# Patient Record
Sex: Female | Born: 1950 | Race: White | Hispanic: No | Smoking: Never smoker
Health system: Southern US, Community
[De-identification: ages and names within clinical notes are randomized; demographics above are authoritative.]

## PROBLEM LIST (undated history)

## (undated) DIAGNOSIS — I639 Cerebral infarction, unspecified: Secondary | ICD-10-CM

## (undated) DIAGNOSIS — I10 Essential (primary) hypertension: Secondary | ICD-10-CM

## (undated) DIAGNOSIS — F419 Anxiety disorder, unspecified: Secondary | ICD-10-CM

## (undated) DIAGNOSIS — E7800 Pure hypercholesterolemia, unspecified: Secondary | ICD-10-CM

## (undated) DIAGNOSIS — E669 Obesity, unspecified: Secondary | ICD-10-CM

## (undated) DIAGNOSIS — I509 Heart failure, unspecified: Secondary | ICD-10-CM

## (undated) DIAGNOSIS — R569 Unspecified convulsions: Secondary | ICD-10-CM

## (undated) HISTORY — DX: Cerebral infarction, unspecified: I63.9

## (undated) HISTORY — PX: ABDOMINAL HYSTERECTOMY: SHX81

## (undated) HISTORY — PX: CHOLECYSTECTOMY: SHX55

## (undated) HISTORY — PX: ABDOMINAL SURGERY: SHX537

## (undated) HISTORY — PX: JOINT REPLACEMENT: SHX530

---

## 2009-06-25 ENCOUNTER — Emergency Department (HOSPITAL_BASED_OUTPATIENT_CLINIC_OR_DEPARTMENT_OTHER): Admission: EM | Admit: 2009-06-25 | Discharge: 2009-06-25 | Payer: Self-pay | Admitting: Emergency Medicine

## 2009-07-01 ENCOUNTER — Emergency Department (HOSPITAL_BASED_OUTPATIENT_CLINIC_OR_DEPARTMENT_OTHER): Admission: EM | Admit: 2009-07-01 | Discharge: 2009-07-02 | Payer: Self-pay | Admitting: Emergency Medicine

## 2009-07-01 ENCOUNTER — Ambulatory Visit: Payer: Self-pay | Admitting: Diagnostic Radiology

## 2009-07-24 ENCOUNTER — Emergency Department (HOSPITAL_BASED_OUTPATIENT_CLINIC_OR_DEPARTMENT_OTHER): Admission: EM | Admit: 2009-07-24 | Discharge: 2009-07-24 | Payer: Self-pay | Admitting: Emergency Medicine

## 2009-10-09 ENCOUNTER — Emergency Department (HOSPITAL_BASED_OUTPATIENT_CLINIC_OR_DEPARTMENT_OTHER): Admission: EM | Admit: 2009-10-09 | Discharge: 2009-10-09 | Payer: Self-pay | Admitting: Emergency Medicine

## 2009-10-27 ENCOUNTER — Ambulatory Visit: Payer: Self-pay | Admitting: Radiology

## 2009-10-27 ENCOUNTER — Emergency Department (HOSPITAL_BASED_OUTPATIENT_CLINIC_OR_DEPARTMENT_OTHER): Admission: EM | Admit: 2009-10-27 | Discharge: 2009-10-27 | Payer: Self-pay | Admitting: Emergency Medicine

## 2009-11-14 ENCOUNTER — Emergency Department (HOSPITAL_BASED_OUTPATIENT_CLINIC_OR_DEPARTMENT_OTHER): Admission: EM | Admit: 2009-11-14 | Discharge: 2009-11-14 | Payer: Self-pay | Admitting: Emergency Medicine

## 2009-11-25 ENCOUNTER — Emergency Department (HOSPITAL_BASED_OUTPATIENT_CLINIC_OR_DEPARTMENT_OTHER): Admission: EM | Admit: 2009-11-25 | Discharge: 2009-11-25 | Payer: Self-pay | Admitting: Emergency Medicine

## 2010-01-18 ENCOUNTER — Emergency Department (HOSPITAL_BASED_OUTPATIENT_CLINIC_OR_DEPARTMENT_OTHER): Admission: EM | Admit: 2010-01-18 | Discharge: 2010-01-18 | Payer: Self-pay | Admitting: Emergency Medicine

## 2010-01-22 ENCOUNTER — Ambulatory Visit: Payer: Self-pay | Admitting: Diagnostic Radiology

## 2010-01-22 ENCOUNTER — Emergency Department (HOSPITAL_BASED_OUTPATIENT_CLINIC_OR_DEPARTMENT_OTHER): Admission: EM | Admit: 2010-01-22 | Discharge: 2010-01-22 | Payer: Self-pay | Admitting: Emergency Medicine

## 2010-02-02 ENCOUNTER — Emergency Department (HOSPITAL_BASED_OUTPATIENT_CLINIC_OR_DEPARTMENT_OTHER): Admission: EM | Admit: 2010-02-02 | Discharge: 2010-02-03 | Payer: Self-pay | Admitting: Emergency Medicine

## 2010-02-10 ENCOUNTER — Emergency Department (HOSPITAL_BASED_OUTPATIENT_CLINIC_OR_DEPARTMENT_OTHER)
Admission: EM | Admit: 2010-02-10 | Discharge: 2010-02-10 | Payer: Self-pay | Source: Home / Self Care | Admitting: Emergency Medicine

## 2010-05-03 ENCOUNTER — Emergency Department (HOSPITAL_BASED_OUTPATIENT_CLINIC_OR_DEPARTMENT_OTHER)
Admission: EM | Admit: 2010-05-03 | Discharge: 2010-05-04 | Payer: Self-pay | Source: Home / Self Care | Admitting: Emergency Medicine

## 2010-06-25 ENCOUNTER — Emergency Department (HOSPITAL_BASED_OUTPATIENT_CLINIC_OR_DEPARTMENT_OTHER)
Admission: EM | Admit: 2010-06-25 | Discharge: 2010-06-25 | Disposition: A | Discharge status: Home / Self Care | Payer: BC Managed Care – PPO | Attending: Emergency Medicine | Admitting: Emergency Medicine

## 2010-06-25 DIAGNOSIS — Y92009 Unspecified place in unspecified non-institutional (private) residence as the place of occurrence of the external cause: Secondary | ICD-10-CM | POA: Insufficient documentation

## 2010-06-25 DIAGNOSIS — W260XXA Contact with knife, initial encounter: Secondary | ICD-10-CM | POA: Insufficient documentation

## 2010-06-25 DIAGNOSIS — W261XXA Contact with sword or dagger, initial encounter: Secondary | ICD-10-CM | POA: Insufficient documentation

## 2010-06-25 DIAGNOSIS — E119 Type 2 diabetes mellitus without complications: Secondary | ICD-10-CM | POA: Insufficient documentation

## 2010-06-25 DIAGNOSIS — E7800 Pure hypercholesterolemia, unspecified: Secondary | ICD-10-CM | POA: Insufficient documentation

## 2010-06-25 DIAGNOSIS — W01119A Fall on same level from slipping, tripping and stumbling with subsequent striking against unspecified sharp object, initial encounter: Secondary | ICD-10-CM | POA: Insufficient documentation

## 2010-06-25 DIAGNOSIS — S0100XA Unspecified open wound of scalp, initial encounter: Secondary | ICD-10-CM | POA: Insufficient documentation

## 2010-06-25 DIAGNOSIS — Z79899 Other long term (current) drug therapy: Secondary | ICD-10-CM | POA: Insufficient documentation

## 2010-06-25 DIAGNOSIS — I10 Essential (primary) hypertension: Secondary | ICD-10-CM | POA: Insufficient documentation

## 2010-07-25 LAB — CBC
Hemoglobin: 14.8 g/dL (ref 12.0–15.0)
MCHC: 34.7 g/dL (ref 30.0–36.0)
MCV: 80.5 fL (ref 78.0–100.0)
Platelets: 303 10*3/uL (ref 150–400)
RBC: 5.29 MIL/uL — ABNORMAL HIGH (ref 3.87–5.11)
RDW: 13.2 % (ref 11.5–15.5)

## 2010-07-25 LAB — URINALYSIS, ROUTINE W REFLEX MICROSCOPIC
Glucose, UA: >1000 mg/dL — AB
Hgb urine dipstick: NEGATIVE
Ketones, ur: 15 mg/dL — AB
Protein, ur: NEGATIVE mg/dL
Urobilinogen, UA: 0.2 mg/dL (ref 0.0–1.0)

## 2010-07-25 LAB — DIFFERENTIAL
Basophils Absolute: 0.1 10*3/uL (ref 0.0–0.1)
Basophils Relative: 1 % (ref 0–1)
Eosinophils Relative: 3 % (ref 0–5)
Lymphs Abs: 2.2 10*3/uL (ref 0.7–4.0)
Monocytes Absolute: 0.7 10*3/uL (ref 0.1–1.0)
Monocytes Relative: 7 % (ref 3–12)
Neutrophils Relative %: 66 % (ref 43–77)

## 2010-07-25 LAB — COMPREHENSIVE METABOLIC PANEL
ALT: 20 U/L (ref 0–35)
Albumin: 4.3 g/dL (ref 3.5–5.2)
Alkaline Phosphatase: 166 U/L — ABNORMAL HIGH (ref 39–117)
CO2: 25 mEq/L (ref 19–32)
Calcium: 9.8 mg/dL (ref 8.4–10.5)
Creatinine, Ser: 0.6 mg/dL (ref 0.4–1.2)
GFR calc Af Amer: >60 mL/min (ref >60)
Sodium: 143 mEq/L (ref 135–145)

## 2010-07-25 LAB — GLUCOSE, CAPILLARY: Glucose-Capillary: 323 mg/dL — ABNORMAL HIGH (ref 70–99)

## 2010-07-25 LAB — LIPASE, BLOOD: Lipase: 73 U/L (ref 23–300)

## 2010-08-04 LAB — CBC
Hemoglobin: 14.1 g/dL (ref 12.0–15.0)
RBC: 4.75 MIL/uL (ref 3.87–5.11)
WBC: 5.6 10*3/uL (ref 4.0–10.5)

## 2010-08-04 LAB — COMPREHENSIVE METABOLIC PANEL
Alkaline Phosphatase: 125 U/L — ABNORMAL HIGH (ref 39–117)
BUN: 11 mg/dL (ref 6–23)
CO2: 30 mEq/L (ref 19–32)
Chloride: 102 mEq/L (ref 96–112)
GFR calc Af Amer: >60 mL/min (ref >60)
Glucose, Bld: 115 mg/dL — ABNORMAL HIGH (ref 70–99)
Potassium: 3.8 mEq/L (ref 3.5–5.1)
Sodium: 142 mEq/L (ref 135–145)
Total Bilirubin: 0.3 mg/dL (ref 0.3–1.2)

## 2010-08-04 LAB — HERPES SIMPLEX VIRUS CULTURE: Culture: DETECTED

## 2010-08-04 LAB — POCT CARDIAC MARKERS
CKMB, poc: <1 ng/mL — ABNORMAL LOW (ref 1.0–8.0)
Troponin i, poc: <0.05 ng/mL (ref 0.00–0.09)

## 2010-08-04 LAB — DIFFERENTIAL
Basophils Absolute: 0.1 10*3/uL (ref 0.0–0.1)
Lymphocytes Relative: 14 % (ref 12–46)
Lymphs Abs: 0.8 10*3/uL (ref 0.7–4.0)
Monocytes Relative: 1 % — ABNORMAL LOW (ref 3–12)
Neutro Abs: 4.6 10*3/uL (ref 1.7–7.7)

## 2010-08-04 LAB — URINALYSIS, ROUTINE W REFLEX MICROSCOPIC
Bilirubin Urine: NEGATIVE
Ketones, ur: NEGATIVE mg/dL
Nitrite: NEGATIVE
Specific Gravity, Urine: 1.024 (ref 1.005–1.030)
pH: 7 (ref 5.0–8.0)

## 2010-08-04 LAB — URINE CULTURE
Colony Count: NO GROWTH
Culture: NO GROWTH

## 2010-08-08 LAB — GLUCOSE, CAPILLARY: Glucose-Capillary: 189 mg/dL — ABNORMAL HIGH (ref 70–99)

## 2010-08-08 LAB — URINALYSIS, ROUTINE W REFLEX MICROSCOPIC
Bilirubin Urine: NEGATIVE
Glucose, UA: NEGATIVE mg/dL
Hgb urine dipstick: NEGATIVE
Nitrite: NEGATIVE

## 2010-08-08 LAB — URINE CULTURE: Culture: NO GROWTH

## 2010-08-08 LAB — GC/CHLAMYDIA PROBE AMP, GENITAL
Chlamydia, DNA Probe: NEGATIVE
GC Probe Amp, Genital: NEGATIVE

## 2010-08-08 LAB — WET PREP, GENITAL: Yeast Wet Prep HPF POC: NONE SEEN

## 2011-02-15 ENCOUNTER — Encounter: Payer: Self-pay | Admitting: *Deleted

## 2011-02-15 ENCOUNTER — Emergency Department (HOSPITAL_BASED_OUTPATIENT_CLINIC_OR_DEPARTMENT_OTHER)
Admission: EM | Admit: 2011-02-15 | Discharge: 2011-02-15 | Disposition: A | Discharge status: Home / Self Care | Payer: BC Managed Care – PPO | Attending: Emergency Medicine | Admitting: Emergency Medicine

## 2011-02-15 DIAGNOSIS — S30860A Insect bite (nonvenomous) of lower back and pelvis, initial encounter: Secondary | ICD-10-CM | POA: Insufficient documentation

## 2011-02-15 DIAGNOSIS — L02219 Cutaneous abscess of trunk, unspecified: Secondary | ICD-10-CM | POA: Insufficient documentation

## 2011-02-15 DIAGNOSIS — L03311 Cellulitis of abdominal wall: Secondary | ICD-10-CM

## 2011-02-15 DIAGNOSIS — E7800 Pure hypercholesterolemia, unspecified: Secondary | ICD-10-CM | POA: Insufficient documentation

## 2011-02-15 DIAGNOSIS — W57XXXA Bitten or stung by nonvenomous insect and other nonvenomous arthropods, initial encounter: Secondary | ICD-10-CM | POA: Insufficient documentation

## 2011-02-15 DIAGNOSIS — Z79899 Other long term (current) drug therapy: Secondary | ICD-10-CM | POA: Insufficient documentation

## 2011-02-15 DIAGNOSIS — I10 Essential (primary) hypertension: Secondary | ICD-10-CM | POA: Insufficient documentation

## 2011-02-15 DIAGNOSIS — E669 Obesity, unspecified: Secondary | ICD-10-CM | POA: Insufficient documentation

## 2011-02-15 HISTORY — DX: Essential (primary) hypertension: I10

## 2011-02-15 HISTORY — DX: Pure hypercholesterolemia, unspecified: E78.00

## 2011-02-15 LAB — GLUCOSE, CAPILLARY: Glucose-Capillary: 335 mg/dL — ABNORMAL HIGH (ref 70–99)

## 2011-02-15 MED ORDER — SULFAMETHOXAZOLE-TRIMETHOPRIM 800-160 MG PO TABS: 1.0000 | ORAL_TABLET | Freq: Two times a day (BID) | ORAL | Status: DC

## 2011-02-15 NOTE — ED Notes (Signed)
Pt amb to room 5 with quick steady gait smiling in nad. Pt reports noticing some redness on her stomach and "aching" x last night. States she has gotten these in the past from her insulin injections, but feels that she has been bitten by an insect, "maybe it was a spider.Marland KitchenMarland Kitchen"

## 2011-02-15 NOTE — ED Provider Notes (Addendum)
History    pleasant female with known history of obesity hypertension and diabetes. She states she noticed a red mark on her anterior abdominal wall this morning and feels she was bitten by an insect. She has had minimal nausea no fevers no drainage.  Patient has been compliant with her daily medications. Denies any recent injury. No other systemic symptoms. Patient did note some dietary indiscretion yesterday for a family party. No history of penicillin allergy.  CSN: [id number] Arrival date & time: 02/15/2011  8:02 AM  Chief Complaint  Patient presents with  . Insect Bite    (Consider location/radiation/quality/duration/timing/severity/associated sxs/prior treatment) HPI  Past Medical History  Diagnosis Date  . Diabetes mellitus   . Hypertension   . Hypercholesteremia     Past Surgical History  Procedure Date  . Abdominal surgery     History reviewed. No pertinent family history.  History  Substance Use Topics  . Smoking status: Never Smoker   . Smokeless tobacco: Not on file  . Alcohol Use: No    OB History    Grav Para Term Preterm Abortions TAB SAB Ect Mult Living                  Review of Systems  Allergies  Penicillins  Home Medications   Current Outpatient Rx  Name Route Sig Dispense Refill  . ATORVASTATIN CALCIUM 40 MG PO TABS Oral Take 40 mg by mouth daily.      . DULOXETINE HCL 60 MG PO CPEP Oral Take 60 mg by mouth daily.      Marland Kitchen FEXOFENADINE HCL 180 MG PO TABS Oral Take 180 mg by mouth daily.      Marland Kitchen GLIPIZIDE 10 MG PO TABS Oral Take 10 mg by mouth 2 (two) times daily before a meal.      . LISINOPRIL-HYDROCHLOROTHIAZIDE 10-12.5 MG PO TABS Oral Take 1 tablet by mouth daily.      Marland Kitchen LORAZEPAM 2 MG PO TABS Oral Take 2 mg by mouth 2 (two) times daily.      Marland Kitchen METHOCARBAMOL 750 MG PO TABS Oral Take 750 mg by mouth 4 (four) times daily.      Marland Kitchen PHENYTOIN SODIUM EXTENDED 100 MG PO CAPS Oral Take 400 mg by mouth 2 (two) times daily.      Marland Kitchen  SITAGLIPTIN-METFORMIN HCL 50-1000 MG PO TABS Oral Take 1 tablet by mouth 2 (two) times daily with a meal.      . ZOLPIDEM TARTRATE 10 MG PO TABS Oral Take 10 mg by mouth at bedtime as needed.        BP 134/67  Pulse 89  Temp(Src) 98.6 F (37 C) (Oral)  Resp 18  SpO2 100%  Physical Exam  Constitutional: She is oriented to person, place, and time. She appears well-developed and well-nourished.       obese  HENT:  Head: Normocephalic and atraumatic.  Eyes: Conjunctivae and EOM are normal. Pupils are equal, round, and reactive to light.  Neck: Neck supple.  Cardiovascular: Normal rate and regular rhythm.  Exam reveals no gallop and no friction rub.   No murmur heard. Pulmonary/Chest: Breath sounds normal. She has no wheezes. She has no rales. She exhibits no tenderness.  Abdominal: Soft. Bowel sounds are normal. She exhibits no distension. There is no tenderness. There is no rebound and no guarding.  Musculoskeletal: Normal range of motion.  Neurological: She is alert and oriented to person, place, and time. No cranial nerve deficit. Coordination normal.  Skin: Skin is warm and dry. No rash noted.       A small oval area to be skin midabdomen with mild redness central umbilication, likely consistent with an insect bite.. Mild early surrounding redness, possibly early cellulitis. Nonfluctuant no drainage no bleeding no surrounding ecchymosis or necrosis.  Psychiatric: She has a normal mood and affect.    ED Course  Procedures (including critical care time)  Labs Reviewed - No data to display No results found.   No diagnosis found.    MDM  Pt is seen and examined;  Initial history and physical completed.  Will follow.          Demontay Grantham A. Jarvin Ogren, MD 02/15/11 1610  Patient's blood sugar was noted to be mildly elevated, just over 300. However she had not taken any of her morning diabetes medications. As such we will give her her normal diabetes medications and we'll  encourage close followup with her blood sugars and diabetic diet over the next several days.  Allia Wiltsey A. Patrica Duel, MD 02/15/11 [phone]

## 2011-02-16 ENCOUNTER — Emergency Department (HOSPITAL_BASED_OUTPATIENT_CLINIC_OR_DEPARTMENT_OTHER)
Admission: EM | Admit: 2011-02-16 | Discharge: 2011-02-16 | Disposition: A | Discharge status: Home / Self Care | Payer: BC Managed Care – PPO | Attending: Emergency Medicine | Admitting: Emergency Medicine

## 2011-02-16 ENCOUNTER — Encounter (HOSPITAL_BASED_OUTPATIENT_CLINIC_OR_DEPARTMENT_OTHER): Payer: Self-pay | Admitting: Emergency Medicine

## 2011-02-16 DIAGNOSIS — L02219 Cutaneous abscess of trunk, unspecified: Secondary | ICD-10-CM | POA: Insufficient documentation

## 2011-02-16 DIAGNOSIS — I10 Essential (primary) hypertension: Secondary | ICD-10-CM | POA: Insufficient documentation

## 2011-02-16 DIAGNOSIS — Z79899 Other long term (current) drug therapy: Secondary | ICD-10-CM | POA: Insufficient documentation

## 2011-02-16 DIAGNOSIS — E7800 Pure hypercholesterolemia, unspecified: Secondary | ICD-10-CM | POA: Insufficient documentation

## 2011-02-16 DIAGNOSIS — L03319 Cellulitis of trunk, unspecified: Secondary | ICD-10-CM | POA: Insufficient documentation

## 2011-02-16 DIAGNOSIS — E119 Type 2 diabetes mellitus without complications: Secondary | ICD-10-CM | POA: Insufficient documentation

## 2011-02-16 DIAGNOSIS — L0390 Cellulitis, unspecified: Secondary | ICD-10-CM

## 2011-02-16 DIAGNOSIS — Z794 Long term (current) use of insulin: Secondary | ICD-10-CM | POA: Insufficient documentation

## 2011-02-16 MED ORDER — CLINDAMYCIN HCL 150 MG PO CAPS: 300.0000 mg | ORAL_CAPSULE | Freq: Three times a day (TID) | ORAL | Status: AC

## 2011-02-16 MED ORDER — CLINDAMYCIN PHOSPHATE 900 MG/50ML IV SOLN
900.0000 mg | Freq: Once | INTRAVENOUS | Status: AC
  Administered 2011-02-16: 900 mg via INTRAVENOUS
  Filled 2011-02-16: qty 50

## 2011-02-16 MED ORDER — SODIUM CHLORIDE 0.9 % IV SOLN
Freq: Once | INTRAVENOUS | Status: AC
  Administered 2011-02-16: 08:00:00 via INTRAVENOUS

## 2011-02-16 NOTE — ED Notes (Signed)
Pt has infected area to abd (insect bite/abscess), pt was seen here yesterday for same and pt reports worsening pain and redness

## 2011-02-16 NOTE — ED Notes (Signed)
IV antibiotics infusing without difficulty pt given coffee per her request

## 2011-02-16 NOTE — ED Provider Notes (Signed)
History    patient presents with increasing abdominal pain. Her pain is focally about an area near her umbilicus. She notes that she had the sharp onset of pain 2 days ago; felt as though she was bitten by an insect. She was seen in this emergency department for this complaint yesterday. She was diagnosedwdiagnosed with likely insect bite with early cellulitis.  She notes that since yesterday her pain and the erythema about the sting has increased. She complains of overnight fever with multiple episodes of emesis, no diarrhea. No relief with anything. Symptoms seem to be worsening but himself. She has been compliant with her Septra as well as all her regular medications.  CSN: [id number] Arrival date & time: 02/16/2011  7:16 AM  Chief Complaint  Patient presents with  . Wound Infection    (Consider location/radiation/quality/duration/timing/severity/associated sxs/prior treatment) HPI  Past Medical History  Diagnosis Date  . Diabetes mellitus   . Hypertension   . Hypercholesteremia     Past Surgical History  Procedure Date  . Abdominal surgery     History reviewed. No pertinent family history.  History  Substance Use Topics  . Smoking status: Never Smoker   . Smokeless tobacco: Not on file  . Alcohol Use: No    OB History    Grav Para Term Preterm Abortions TAB SAB Ect Mult Living                  Review of Systems  Constitutional: Positive for fever and chills.  HENT: Negative.   Eyes: Negative.   Respiratory: Negative.   Cardiovascular: Negative.   Genitourinary: Negative.   Musculoskeletal: Positive for myalgias.  Neurological: Positive for light-headedness.    Allergies  Penicillins  Home Medications   Current Outpatient Rx  Name Route Sig Dispense Refill  . ATORVASTATIN CALCIUM 40 MG PO TABS Oral Take 40 mg by mouth daily.      . DULOXETINE HCL 60 MG PO CPEP Oral Take 60 mg by mouth daily.      Marland Kitchen FEXOFENADINE HCL 180 MG PO TABS Oral Take 180 mg by  mouth daily.      Marland Kitchen GLIPIZIDE 10 MG PO TABS Oral Take 10 mg by mouth 2 (two) times daily before a meal.      . LISINOPRIL-HYDROCHLOROTHIAZIDE 10-12.5 MG PO TABS Oral Take 1 tablet by mouth daily.      Marland Kitchen LORAZEPAM 2 MG PO TABS Oral Take 2 mg by mouth 2 (two) times daily.      Marland Kitchen METHOCARBAMOL 750 MG PO TABS Oral Take 750 mg by mouth 4 (four) times daily.      Marland Kitchen PHENYTOIN SODIUM EXTENDED 100 MG PO CAPS Oral Take 400 mg by mouth 2 (two) times daily.      Marland Kitchen SITAGLIPTIN-METFORMIN HCL 50-1000 MG PO TABS Oral Take 1 tablet by mouth 2 (two) times daily with a meal.      . SULFAMETHOXAZOLE-TRIMETHOPRIM 800-160 MG PO TABS Oral Take 1 tablet by mouth every 12 (twelve) hours. 10 tablet 0  . ZOLPIDEM TARTRATE 10 MG PO TABS Oral Take 10 mg by mouth at bedtime as needed.        BP 148/98  Pulse 103  Temp(Src) 98 F (36.7 C) (Oral)  Resp 18  SpO2 95%  Physical Exam  Constitutional: She is oriented to person, place, and time. She appears well-developed and well-nourished.  HENT:  Head: Normocephalic and atraumatic.  Eyes: EOM are normal.  Cardiovascular: Normal rate and regular rhythm.  Pulmonary/Chest: Effort normal and breath sounds normal.  Abdominal: She exhibits no distension. There is no tenderness. There is no guarding.  Musculoskeletal: She exhibits no edema and no tenderness.  Neurological: She is alert and oriented to person, place, and time.  Skin: Skin is warm and dry.       On the anterior abdomen there is a roughly 3 cm patch of particularly dark skin with surrounding erythema extending approximately 6 cm in all directions. Area is nonfluctuant, mildly indurated in the center, with no appreciable drainable area.    ED Course  Procedures (including critical care time)  Labs Reviewed - No data to display No results found.   No diagnosis found.    MDM  This insulin-dependent diabetic now presents with concerns over wound infection. The wound itself is erythematous, slightly  indurated, without any drainable collection. Although the patient is early in her antibiotic course, her description of increasing fever, nausea, emesis is somewhat concerning, though here she is afebrile, and generally well appearing. She has received a single IV dose of clindamycin, and notes that she is feeling more comfortable. She was discharged with clindamycin, with cessation of the Septra. Explicit return precautions were provided.        Gerhard Munch, MD 02/16/11 [phone]

## 2011-02-16 NOTE — ED Notes (Signed)
Pt states was seen  Here yesterday diagnosed with cellulitis and placed on bactrim she has taken two doses so far is concerned because she is having pain and the "heat" is worse

## 2013-12-26 DIAGNOSIS — M1990 Unspecified osteoarthritis, unspecified site: Secondary | ICD-10-CM | POA: Insufficient documentation

## 2013-12-26 DIAGNOSIS — G40909 Epilepsy, unspecified, not intractable, without status epilepticus: Secondary | ICD-10-CM

## 2013-12-26 DIAGNOSIS — F411 Generalized anxiety disorder: Secondary | ICD-10-CM | POA: Diagnosis present

## 2013-12-26 DIAGNOSIS — K589 Irritable bowel syndrome without diarrhea: Secondary | ICD-10-CM | POA: Insufficient documentation

## 2013-12-26 DIAGNOSIS — F321 Major depressive disorder, single episode, moderate: Secondary | ICD-10-CM | POA: Insufficient documentation

## 2014-04-10 ENCOUNTER — Emergency Department (HOSPITAL_COMMUNITY): Payer: BC Managed Care – PPO

## 2014-04-10 ENCOUNTER — Emergency Department (HOSPITAL_COMMUNITY)
Admission: EM | Admit: 2014-04-10 | Discharge: 2014-04-11 | Disposition: A | Discharge status: Home / Self Care | Payer: BC Managed Care – PPO | Attending: Emergency Medicine | Admitting: Emergency Medicine

## 2014-04-10 ENCOUNTER — Encounter (HOSPITAL_COMMUNITY): Payer: Self-pay | Admitting: *Deleted

## 2014-04-10 DIAGNOSIS — E119 Type 2 diabetes mellitus without complications: Secondary | ICD-10-CM | POA: Insufficient documentation

## 2014-04-10 DIAGNOSIS — Z880 Allergy status to penicillin: Secondary | ICD-10-CM | POA: Diagnosis not present

## 2014-04-10 DIAGNOSIS — M542 Cervicalgia: Secondary | ICD-10-CM | POA: Insufficient documentation

## 2014-04-10 DIAGNOSIS — I10 Essential (primary) hypertension: Secondary | ICD-10-CM | POA: Insufficient documentation

## 2014-04-10 DIAGNOSIS — R51 Headache: Secondary | ICD-10-CM | POA: Insufficient documentation

## 2014-04-10 DIAGNOSIS — E780 Pure hypercholesterolemia: Secondary | ICD-10-CM | POA: Diagnosis not present

## 2014-04-10 DIAGNOSIS — G510 Bell's palsy: Secondary | ICD-10-CM | POA: Diagnosis not present

## 2014-04-10 DIAGNOSIS — Z79899 Other long term (current) drug therapy: Secondary | ICD-10-CM | POA: Insufficient documentation

## 2014-04-10 DIAGNOSIS — H539 Unspecified visual disturbance: Secondary | ICD-10-CM | POA: Diagnosis not present

## 2014-04-10 DIAGNOSIS — Z760 Encounter for issue of repeat prescription: Secondary | ICD-10-CM | POA: Insufficient documentation

## 2014-04-10 DIAGNOSIS — R519 Headache, unspecified: Secondary | ICD-10-CM

## 2014-04-10 LAB — CBC WITH DIFFERENTIAL/PLATELET
Basophils Absolute: 0 10*3/uL (ref 0.0–0.1)
Basophils Relative: 0 % (ref 0–1)
Eosinophils Absolute: 0 10*3/uL (ref 0.0–0.7)
Eosinophils Relative: 0 % (ref 0–5)
HCT: 39.2 % (ref 36.0–46.0)
HEMOGLOBIN: 13.2 g/dL (ref 12.0–15.0)
LYMPHS ABS: 1.2 10*3/uL (ref 0.7–4.0)
LYMPHS PCT: 14 % (ref 12–46)
MCH: 28.2 pg (ref 26.0–34.0)
MCHC: 33.7 g/dL (ref 30.0–36.0)
MCV: 83.8 fL (ref 78.0–100.0)
MONOS PCT: 4 % (ref 3–12)
Monocytes Absolute: 0.4 10*3/uL (ref 0.1–1.0)
NEUTROS ABS: 7.2 10*3/uL (ref 1.7–7.7)
NEUTROS PCT: 82 % — AB (ref 43–77)
PLATELETS: 345 10*3/uL (ref 150–400)
RBC: 4.68 MIL/uL (ref 3.87–5.11)
RDW: 13.5 % (ref 11.5–15.5)
WBC: 8.7 10*3/uL (ref 4.0–10.5)

## 2014-04-10 LAB — BASIC METABOLIC PANEL
Anion gap: 16 — ABNORMAL HIGH (ref 5–15)
BUN: 21 mg/dL (ref 6–23)
CHLORIDE: 101 meq/L (ref 96–112)
CO2: 20 meq/L (ref 19–32)
Calcium: 9.3 mg/dL (ref 8.4–10.5)
Creatinine, Ser: 0.9 mg/dL (ref 0.50–1.10)
GFR calc Af Amer: 78 mL/min — ABNORMAL LOW (ref >90)
GFR, EST NON AFRICAN AMERICAN: 68 mL/min — AB (ref >90)
GLUCOSE: 337 mg/dL — AB (ref 70–99)
POTASSIUM: 4.4 meq/L (ref 3.7–5.3)
SODIUM: 137 meq/L (ref 137–147)

## 2014-04-10 MED ORDER — IOHEXOL 350 MG/ML SOLN
80.0000 mL | Freq: Once | INTRAVENOUS | Status: AC | PRN
  Administered 2014-04-10: 80 mL via INTRAVENOUS

## 2014-04-10 MED ORDER — HYDROMORPHONE HCL 1 MG/ML IJ SOLN
1.0000 mg | Freq: Once | INTRAMUSCULAR | Status: AC
Start: 2014-04-10 — End: 2014-04-10
  Administered 2014-04-10: 1 mg via INTRAVENOUS
  Filled 2014-04-10: qty 1

## 2014-04-10 MED ORDER — ONDANSETRON HCL 4 MG/2ML IJ SOLN
4.0000 mg | Freq: Once | INTRAMUSCULAR | Status: AC
  Administered 2014-04-10: 4 mg via INTRAVENOUS
  Filled 2014-04-10: qty 2

## 2014-04-10 NOTE — ED Notes (Signed)
Pt in stating that she was seen yesterday at [facility] and dx with bells palsy, pt states she was written some prescriptions for antibiotics that she cannot afford. Here tonight to get a new antibiotic written

## 2014-04-10 NOTE — ED Provider Notes (Signed)
CSN: [id number]     Arrival date & time 04/10/14  1952 History   First MD Initiated Contact with Patient 04/10/14 2025     Chief Complaint  Patient presents with  . Medication Refill     (Consider location/radiation/quality/duration/timing/severity/associated sxs/prior Treatment) HPI Comments: Patient with history of diabetes, hypertension, high cholesterol, chronic back pain presents with complaint of Bell's palsy and need for medication prescription.  Patient states that she began having severe intermittent right-sided neck pain approximately one week ago that began while she was vacuuming. Episodes were intermittent lasting several seconds at a time. Patient is having a throbbing neckache in between episodes of severe pain. Patient has a history of cataract surgery but is noted progressively worsening blurry vision in her right eye only. She denies any sort of visual loss. Two days ago patient developed dysgeusia, right-sided facial droop, trouble using a straw. She was continuing to have pain. Patient was seen at [facility] where she had a head CT done. She was diagnosed with Bell's palsy and discharged to home on prednisone and Valtrex. Due to the patient's inability to afford Valtrex, she presents to the emergency department tonight for consideration of a different medication. Patient also has a history of seizures and takes Dilantin.  The history is provided by the patient.    Past Medical History  Diagnosis Date  . Diabetes mellitus   . Hypertension   . Hypercholesteremia    Past Surgical History  Procedure Laterality Date  . Abdominal surgery     History reviewed. No pertinent family history. History  Substance Use Topics  . Smoking status: Never Smoker   . Smokeless tobacco: Not on file  . Alcohol Use: No   OB History    No data available     Review of Systems  Constitutional: Negative for fever.  HENT: Negative for congestion, dental problem,  rhinorrhea and sinus pressure.   Eyes: Positive for visual disturbance. Negative for photophobia, discharge and redness.  Respiratory: Negative for shortness of breath.   Cardiovascular: Negative for chest pain.  Gastrointestinal: Negative for nausea and vomiting.  Musculoskeletal: Positive for neck pain. Negative for gait problem and neck stiffness.  Skin: Negative for rash.  Neurological: Positive for facial asymmetry, weakness (facial) and headaches. Negative for syncope, speech difficulty, light-headedness and numbness.  Psychiatric/Behavioral: Negative for confusion.      Allergies  Penicillins  Home Medications   Prior to Admission medications   Medication Sig Start Date End Date Taking? Authorizing Provider  atorvastatin (LIPITOR) 40 MG tablet Take 40 mg by mouth daily.      Historical Provider, MD  DULoxetine (CYMBALTA) 60 MG capsule Take 60 mg by mouth daily.      Historical Provider, MD  fexofenadine (ALLEGRA) 180 MG tablet Take 180 mg by mouth daily.      Historical Provider, MD  glipiZIDE (GLUCOTROL) 10 MG tablet Take 10 mg by mouth 2 (two) times daily before a meal.      Historical Provider, MD  lisinopril-hydrochlorothiazide (PRINZIDE,ZESTORETIC) 10-12.5 MG per tablet Take 1 tablet by mouth daily.      Historical Provider, MD  LORazepam (ATIVAN) 2 MG tablet Take 2 mg by mouth 2 (two) times daily.      Historical Provider, MD  methocarbamol (ROBAXIN) 750 MG tablet Take 750 mg by mouth 4 (four) times daily.      Historical Provider, MD  phenytoin (DILANTIN) 100 MG ER capsule Take 400 mg by mouth 2 (two) times  daily.      Historical Provider, MD  sitaGLIPtan-metformin (JANUMET) 50-1000 MG per tablet Take 1 tablet by mouth 2 (two) times daily with a meal.      Historical Provider, MD  zolpidem (AMBIEN) 10 MG tablet Take 10 mg by mouth at bedtime as needed.      Historical Provider, MD   BP 154/75 mmHg  Pulse 92  Temp(Src) 98 F (36.7 C) (Oral)  Resp 24  SpO2  98% Physical Exam  Constitutional: She is oriented to person, place, and time. She appears well-developed and well-nourished.  HENT:  Head: Normocephalic and atraumatic.  Right Ear: Tympanic membrane, external ear and ear canal normal.  Left Ear: Tympanic membrane, external ear and ear canal normal.  Nose: Nose normal.  Mouth/Throat: Uvula is midline, oropharynx is clear and moist and mucous membranes are normal.  Eyes: Conjunctivae, EOM and lids are normal. Pupils are equal, round, and reactive to light. Right eye exhibits no nystagmus. Left eye exhibits no nystagmus.  Neck: Normal range of motion. Neck supple. No spinous process tenderness and no muscular tenderness present. Carotid bruit is not present. No thyroid mass and no thyromegaly present.  Cardiovascular: Normal rate and regular rhythm.   Pulmonary/Chest: Effort normal and breath sounds normal.  Abdominal: Soft. There is no tenderness.  Musculoskeletal:       Cervical back: She exhibits normal range of motion, no tenderness and no bony tenderness.  Neurological: She is alert and oriented to person, place, and time. She has normal strength and normal reflexes. A cranial nerve deficit (Patient with right facial nerve palsy) and sensory deficit is present. She displays a negative Romberg sign. Coordination and gait (baseline) normal. GCS eye subscore is 4. GCS verbal subscore is 5. GCS motor subscore is 6.  Patient ambulatory at baseline with cane. Patient has right-sided facial droop and loss of nasolabial fold. Patient's forehead is not spared.  Skin: Skin is warm and dry.  Psychiatric: She has a normal mood and affect.  Nursing note and vitals reviewed.   ED Course  Procedures (including critical care time) Labs Review Labs Reviewed  CBC WITH DIFFERENTIAL - Abnormal; Notable for the following:    Neutrophils Relative % 82 (*)    All other components within normal limits  BASIC METABOLIC PANEL - Abnormal; Notable for the  following:    Glucose, Bld 337 (*)    GFR calc non Af Amer 68 (*)    GFR calc Af Amer 78 (*)    Anion gap 16 (*)    All other components within normal limits    Imaging Review Ct Angio Neck W/cm &/or Wo/cm  04/10/2014   CLINICAL DATA:  Severe occipital and RIGHT posterior neck pain for 1 week; Bell's palsy like symptoms for 2 days, assess for vascular dissection. History of diabetes and hypertension.  EXAM: CT ANGIOGRAPHY NECK  TECHNIQUE: Multidetector CT imaging of the neck was performed using the standard protocol during bolus administration of intravenous contrast. Multiplanar CT image reconstructions and MIPs were obtained to evaluate the vascular anatomy. Carotid stenosis measurements (when applicable) are obtained utilizing NASCET criteria, using the distal internal carotid diameter as the denominator.  CONTRAST:  80mL OMNIPAQUE IOHEXOL 350 MG/ML SOLN  COMPARISON:  None available for comparison at time of study interpretation.  FINDINGS: Large body habitus results in overall noisy image quality.  RIGHT-sided aortic arch, the LEFT subclavian artery courses posteriorly to the trachea and esophagus. The LEFT Common carotid artery courses anteriorly to  the trachea and esophagus. Mild calcific atherosclerosis of the descending aorta. The origins of the innominate, left Common carotid artery and subclavian artery are widely patent.  Bilateral Common carotid arteries are widely patent, coursing in a straight line fashion. Assessment is somewhat limited by large body habitus and, somewhat poor contrast bolus timing. Normal appearance of the carotid bifurcations without hemodynamically significant stenosis by NASCET criteria. Under 2 mm eccentric calcific atherosclerosis of the origin LEFT internal carotid artery. Normal appearance of the included internal carotid arteries.  RIGHT vertebral artery is dominant. Normal appearance of the vertebral arteries, which appear widely patent. 1-2 mm eccentric calcific  atherosclerosis of the LEFT V4 segment without hemodynamically significant stenosis.  No hemodynamically significant stenosis by NASCET criteria. No dissection, no pseudoaneurysm. No abnormal luminal irregularity. No contrast extravasation.  Fatty replaced parotid glands. Prominent though not pathologically enlarged cervical lymph nodes are likely reactive. No acute osseous process though bone windows have not been submitted. At least moderate cervical spondylosis. Apparent severe LEFT C3-4 through C6-7 neural foraminal narrowing.  Review of the MIP images confirms the above findings.  IMPRESSION: Habitus limited examination.  No convincing evidence of acute vascular injury though, assessment limited by habitus and poor bolus timing. No definite hemodynamically significant stenosis.  RIGHT-sided aortic arch with aberrant LEFT subclavian artery.  Cervical spondylosis with apparent severe LEFT C3-4 thru C6-7 neural foraminal narrowing on these soft tissue windows.   Electronically Signed   By: Awilda Metro   On: 04/10/2014 23:25     EKG Interpretation None       9:52 PM Patient seen and examined. Work-up initiated. Medications ordered. Patient has exam consistent with a facial nerve palsy, however it would be unusual for the patient to have severe pain in this setting. I think that we need to rule out a vertebral artery or carotid artery dissection given the patient's pain and neurological symptoms. Patient agrees. We will give pain medication, check labs, performed CT angiogram with and without contrast of the patient's neck. We will move from fast track to acute ED.  Vital signs reviewed and are as follows: BP 154/75 mmHg  Pulse 92  Temp(Src) 98 F (36.7 C) (Oral)  Resp 24  SpO2 98%  1:40 AM Prior to discharge, imaging results discussed with Dr. Romeo Apple. Patient seen by Dr. Romeo Apple.   Patient and husband informed of results. Will give prescription for acyclovir. Patient was given first  dose of acyclovir in emergency department. Of note, patient is no longer on metformin so she will not need all this given IV dye load. Patient to follow-up with her primary care physician in the coming week for a re-check.  MDM   Final diagnoses:  Headache  Bell's palsy  Neck pain   Patient with severe neck pain followed several days later with development of symptoms consistent with Bell's palsy. Patient had a reportedly negative head CT last night performed at [facility]. Given neck pain and neurological symptoms, CT angiography of the neck was performed to evaluate for carotid or vertebral dissection as the cause of her neuro deficits and pain. This was negative. Pain is likely musculoskeletal in nature. Patient treated with pain medication and muscle relaxer. She was given a prescription for acyclovir that she can take for the next 7 days. She should be able to afford his medication. Otherwise patient stable during the emergency department stay. No change in her Bell's palsy symptoms.    Renne Crigler, PA-C 04/11/14 [phone]  Purvis Sheffield, MD 04/11/14 [phone]

## 2014-04-11 MED ORDER — METHOCARBAMOL 500 MG PO TABS
500.0000 mg | ORAL_TABLET | Freq: Two times a day (BID) | ORAL | Status: DC
Start: 2014-04-11 — End: 2015-01-03

## 2014-04-11 MED ORDER — ACYCLOVIR 400 MG PO TABS
400.0000 mg | ORAL_TABLET | Freq: Once | ORAL | Status: AC
  Administered 2014-04-11: 400 mg via ORAL
  Filled 2014-04-11: qty 1

## 2014-04-11 MED ORDER — OXYCODONE-ACETAMINOPHEN 5-325 MG PO TABS: 1.0000 | ORAL_TABLET | Freq: Four times a day (QID) | ORAL | Status: DC | PRN

## 2014-04-11 MED ORDER — ACYCLOVIR 400 MG PO TABS: 400.0000 mg | ORAL_TABLET | Freq: Every day | ORAL | Status: DC

## 2014-04-11 NOTE — ED Notes (Signed)
Dr Harrison at bedside

## 2014-04-11 NOTE — Discharge Instructions (Signed)
Please read and follow all provided instructions.  Your diagnoses today include:  1. Bell's palsy   2. Headache   3. Neck pain     Tests performed today include:  CT of your neck which did not show any problems with the blood vessels in your neck.   Vital signs. See below for your results today.   Medications:   Percocet (oxycodone/acetaminophen) - narcotic pain medication  DO NOT drive or perform any activities that require you to be awake and alert because this medicine can make you drowsy. BE VERY CAREFUL not to take multiple medicines containing Tylenol (also called acetaminophen). Doing so can lead to an overdose which can damage your liver and cause liver failure and possibly death.  Take any prescribed medications only as directed.  Additional information:  Follow any educational materials contained in this packet.  BE VERY CAREFUL not to take multiple medicines containing Tylenol (also called acetaminophen). Doing so can lead to an overdose which can damage your liver and cause liver failure and possibly death.   Follow-up instructions: Please follow-up with your primary care provider in the next 3 days for further evaluation of your symptoms.   Return instructions:   Please return to the Emergency Department if you experience worsening symptoms.  Return if the medications do not resolve your headache, if it recurs, or if you have multiple episodes of vomiting or cannot keep down fluids.  Return if you have a change from the usual headache.  RETURN IMMEDIATELY IF you:  Develop a sudden, severe headache  Develop confusion or become poorly responsive or faint  Develop a fever above 100.58F or problem breathing  Have a change in speech, vision, swallowing, or understanding  Develop new weakness, numbness, tingling, incoordination in your arms or legs  Have a seizure  Please return if you have any other emergent concerns.  Additional Information:  Your vital  signs today were: BP 113/55 mmHg   Pulse 80   Temp(Src) 98 F (36.7 C) (Oral)   Resp 24   SpO2 98% If your blood pressure (BP) was elevated above 135/85 this visit, please have this repeated by your doctor within one month. --------------

## 2014-11-21 ENCOUNTER — Encounter (HOSPITAL_COMMUNITY): Payer: Self-pay | Admitting: *Deleted

## 2014-11-21 ENCOUNTER — Emergency Department (HOSPITAL_COMMUNITY)
Admission: EM | Admit: 2014-11-21 | Discharge: 2014-11-21 | Disposition: A | Discharge status: Home / Self Care | Payer: BLUE CROSS/BLUE SHIELD | Attending: Emergency Medicine | Admitting: Emergency Medicine

## 2014-11-21 DIAGNOSIS — R350 Frequency of micturition: Secondary | ICD-10-CM | POA: Diagnosis not present

## 2014-11-21 DIAGNOSIS — B373 Candidiasis of vulva and vagina: Secondary | ICD-10-CM | POA: Insufficient documentation

## 2014-11-21 DIAGNOSIS — R300 Dysuria: Secondary | ICD-10-CM | POA: Diagnosis not present

## 2014-11-21 DIAGNOSIS — R319 Hematuria, unspecified: Secondary | ICD-10-CM | POA: Diagnosis present

## 2014-11-21 DIAGNOSIS — Z79899 Other long term (current) drug therapy: Secondary | ICD-10-CM | POA: Insufficient documentation

## 2014-11-21 DIAGNOSIS — R3915 Urgency of urination: Secondary | ICD-10-CM | POA: Diagnosis not present

## 2014-11-21 DIAGNOSIS — Z880 Allergy status to penicillin: Secondary | ICD-10-CM | POA: Insufficient documentation

## 2014-11-21 DIAGNOSIS — R34 Anuria and oliguria: Secondary | ICD-10-CM | POA: Diagnosis not present

## 2014-11-21 DIAGNOSIS — E780 Pure hypercholesterolemia: Secondary | ICD-10-CM | POA: Insufficient documentation

## 2014-11-21 DIAGNOSIS — I10 Essential (primary) hypertension: Secondary | ICD-10-CM | POA: Diagnosis not present

## 2014-11-21 DIAGNOSIS — B3731 Acute candidiasis of vulva and vagina: Secondary | ICD-10-CM

## 2014-11-21 DIAGNOSIS — E119 Type 2 diabetes mellitus without complications: Secondary | ICD-10-CM | POA: Diagnosis not present

## 2014-11-21 LAB — COMPREHENSIVE METABOLIC PANEL
ALBUMIN: 3.6 g/dL (ref 3.5–5.0)
ALT: 19 U/L (ref 14–54)
ANION GAP: 9 (ref 5–15)
AST: 19 U/L (ref 15–41)
Alkaline Phosphatase: 97 U/L (ref 38–126)
BILIRUBIN TOTAL: 0.2 mg/dL — AB (ref 0.3–1.2)
BUN: 15 mg/dL (ref 6–20)
CALCIUM: 9 mg/dL (ref 8.9–10.3)
CO2: 23 mmol/L (ref 22–32)
CREATININE: 0.73 mg/dL (ref 0.44–1.00)
Chloride: 107 mmol/L (ref 101–111)
GFR calc Af Amer: >60 mL/min (ref >60)
GFR calc non Af Amer: >60 mL/min (ref >60)
Glucose, Bld: 135 mg/dL — ABNORMAL HIGH (ref 65–99)
Potassium: 3.6 mmol/L (ref 3.5–5.1)
SODIUM: 139 mmol/L (ref 135–145)
TOTAL PROTEIN: 7.3 g/dL (ref 6.5–8.1)

## 2014-11-21 LAB — URINALYSIS, ROUTINE W REFLEX MICROSCOPIC
BILIRUBIN URINE: NEGATIVE
Glucose, UA: NEGATIVE mg/dL
HGB URINE DIPSTICK: NEGATIVE
KETONES UR: NEGATIVE mg/dL
Leukocytes, UA: NEGATIVE
Nitrite: NEGATIVE
PROTEIN: NEGATIVE mg/dL
Specific Gravity, Urine: 1.018 (ref 1.005–1.030)
UROBILINOGEN UA: 1 mg/dL (ref 0.0–1.0)
pH: 7 (ref 5.0–8.0)

## 2014-11-21 LAB — CBC WITH DIFFERENTIAL/PLATELET
Basophils Absolute: 0 10*3/uL (ref 0.0–0.1)
Basophils Relative: 0 % (ref 0–1)
EOS ABS: 0.1 10*3/uL (ref 0.0–0.7)
Eosinophils Relative: 2 % (ref 0–5)
HEMATOCRIT: 40.3 % (ref 36.0–46.0)
Hemoglobin: 13.3 g/dL (ref 12.0–15.0)
LYMPHS ABS: 2.3 10*3/uL (ref 0.7–4.0)
LYMPHS PCT: 28 % (ref 12–46)
MCH: 28.1 pg (ref 26.0–34.0)
MCHC: 33 g/dL (ref 30.0–36.0)
MCV: 85 fL (ref 78.0–100.0)
MONO ABS: 0.5 10*3/uL (ref 0.1–1.0)
MONOS PCT: 6 % (ref 3–12)
Neutro Abs: 5.2 10*3/uL (ref 1.7–7.7)
Neutrophils Relative %: 64 % (ref 43–77)
PLATELETS: 344 10*3/uL (ref 150–400)
RBC: 4.74 MIL/uL (ref 3.87–5.11)
RDW: 13.6 % (ref 11.5–15.5)
WBC: 8.1 10*3/uL (ref 4.0–10.5)

## 2014-11-21 LAB — LIPASE, BLOOD: LIPASE: 23 U/L (ref 22–51)

## 2014-11-21 LAB — WET PREP, GENITAL
CLUE CELLS WET PREP: NONE SEEN
Trich, Wet Prep: NONE SEEN
WBC, Wet Prep HPF POC: NONE SEEN
YEAST WET PREP: NONE SEEN

## 2014-11-21 MED ORDER — PHENAZOPYRIDINE HCL 100 MG PO TABS
200.0000 mg | ORAL_TABLET | Freq: Once | ORAL | Status: AC
  Administered 2014-11-21: 200 mg via ORAL
  Filled 2014-11-21: qty 2

## 2014-11-21 MED ORDER — NYSTATIN 100000 UNIT/GM EX CREA: TOPICAL_CREAM | CUTANEOUS | Status: DC

## 2014-11-21 NOTE — Discharge Instructions (Signed)
Please follow up with your primary care physician in 1-2 days. If you do not have one please call the [facility] number listed above. Please read all discharge instructions and return precautions.    Candidal Vulvovaginitis Candidal vulvovaginitis is an infection of the vagina and vulva. The vulva is the skin around the opening of the vagina. This may cause itching and discomfort in and around the vagina.  HOME CARE  Only take medicine as told by your doctor.  Do not have sex (intercourse) until the infection is healed or as told by your doctor.  Practice safe sex.  Tell your sex partner about your infection.  Do not douche or use tampons.  Wear cotton underwear. Do not wear tight pants or panty hose.  Eat yogurt. This may help treat and prevent yeast infections. GET HELP RIGHT AWAY IF:   You have a fever.  Your problems get worse during treatment or do not get better in 3 days.  You have discomfort, irritation, or itching in your vagina or vulva area.  You have pain after sex.  You start to get belly (abdominal) pain. MAKE SURE YOU:  Understand these instructions.  Will watch your condition.  Will get help right away if you are not doing well or get worse. Document Released: 07/28/2008 Document Revised: 05/06/2013 Document Reviewed: 07/28/2008 [facility] Patient Information 2015 [place], [place]. This information is not intended to replace advice given to you by your health care provider. Make sure you discuss any questions you have with your health care provider.

## 2014-11-21 NOTE — ED Provider Notes (Signed)
CSN: [id number]      Arrival date & time 11/21/14  1715 History   First MD Initiated Contact with Patient 11/21/14 1723     Chief Complaint  Patient presents with  . Hematuria     (Consider location/radiation/quality/duration/timing/severity/associated sxs/prior Treatment) HPI Comments: Patient is a 64 year old female past medical history significant for DM, HTN, HLD presenting to the emergency department for 2 complaints. First complaint is one week of dysuria with associated urinary frequency, urgency, decreased urine and suprapubic tenderness. Denies any associated fevers, nausea, vomiting, back or flank pain. No modifying factors identified. History of UTIs similar. Patient is also concerned she may have a yeast infection. She states "my skin down there feels raw." Denies any vaginal discharge, diarrhea, or constipation.   The history is provided by the patient.    Past Medical History  Diagnosis Date  . Diabetes mellitus   . Hypertension   . Hypercholesteremia    Past Surgical History  Procedure Laterality Date  . Abdominal surgery     No family history on file. History  Substance Use Topics  . Smoking status: Never Smoker   . Smokeless tobacco: Not on file  . Alcohol Use: No   OB History    No data available     Review of Systems  Constitutional: Negative for fever.  Genitourinary: Positive for dysuria, urgency, frequency and decreased urine volume. Negative for hematuria, flank pain, vaginal bleeding and vaginal discharge.  All other systems reviewed and are negative.     Allergies  Penicillins  Home Medications   Prior to Admission medications   Medication Sig Start Date End Date Taking? Authorizing Provider  acyclovir (ZOVIRAX) 400 MG tablet Take 1 tablet (400 mg total) by mouth 5 (five) times daily. 04/11/14   Renne Crigler, PA-C  atorvastatin (LIPITOR) 40 MG tablet Take 40 mg by mouth daily.      Historical Provider, MD  DULoxetine (CYMBALTA) 60 MG  capsule Take 60 mg by mouth daily.      Historical Provider, MD  fexofenadine (ALLEGRA) 180 MG tablet Take 180 mg by mouth daily.      Historical Provider, MD  glipiZIDE (GLUCOTROL) 10 MG tablet Take 10 mg by mouth 2 (two) times daily before a meal.      Historical Provider, MD  lisinopril-hydrochlorothiazide (PRINZIDE,ZESTORETIC) 10-12.5 MG per tablet Take 1 tablet by mouth daily.      Historical Provider, MD  LORazepam (ATIVAN) 2 MG tablet Take 2 mg by mouth 2 (two) times daily.      Historical Provider, MD  methocarbamol (ROBAXIN) 500 MG tablet Take 1 tablet (500 mg total) by mouth 2 (two) times daily. 04/11/14   Renne Crigler, PA-C  nystatin cream (MYCOSTATIN) Apply to affected area 2 times daily 11/21/14   Francee Piccolo, PA-C  oxyCODONE-acetaminophen (PERCOCET/ROXICET) 5-325 MG per tablet Take 1 tablet by mouth every 6 (six) hours as needed for severe pain. 04/11/14   Renne Crigler, PA-C  phenytoin (DILANTIN) 100 MG ER capsule Take 400 mg by mouth 2 (two) times daily.      Historical Provider, MD  sitaGLIPtan-metformin (JANUMET) 50-1000 MG per tablet Take 1 tablet by mouth 2 (two) times daily with a meal.      Historical Provider, MD  zolpidem (AMBIEN) 10 MG tablet Take 10 mg by mouth at bedtime as needed.      Historical Provider, MD   BP 118/58 mmHg  Pulse 72  Temp(Src) 98.3 F (36.8 C) (Oral)  Resp 18  Ht 5\' 4"  (1.626 m)  Wt 290 lb (131.543 kg)  BMI 49.75 kg/m2  SpO2 99% Physical Exam  Constitutional: She is oriented to person, place, and time. She appears well-developed and well-nourished. No distress.  HENT:  Head: Normocephalic and atraumatic.  Right Ear: External ear normal.  Left Ear: External ear normal.  Nose: Nose normal.  Mouth/Throat: Oropharynx is clear and moist. No oropharyngeal exudate.  Eyes: Conjunctivae are normal.  Neck: Normal range of motion. Neck supple.  No nuchal rigidity.   Cardiovascular: Normal rate, regular rhythm and normal heart sounds.    Pulmonary/Chest: Effort normal and breath sounds normal.  Abdominal: Soft. Bowel sounds are normal. She exhibits no distension. There is no tenderness. There is no rigidity, no rebound, no guarding and no CVA tenderness.  Genitourinary: There is rash and tenderness on the right labia. There is no lesion on the right labia. There is rash and tenderness on the left labia. There is no lesion on the left labia.  Musculoskeletal: Normal range of motion.  Neurological: She is alert and oriented to person, place, and time.  Skin: Skin is warm and dry. She is not diaphoretic.  Psychiatric: She has a normal mood and affect.  Nursing note and vitals reviewed.  Exam performed by Francee PiccoloPIEPENBRINK, Renly Roots L,  exam chaperoned Date: 11/21/2014 Pelvic exam: normal externa genitalia without evidence of trauma. VULVA: normal appearing vulva, skin red no warmth with no masses, tenderness or lesion. VAGINA: normal appearing vagina with normal color and discharge, no lesions. CERVIX: normal appearing cervix without lesions, cervical motion tenderness absent, cervical os closed with out purulent discharge; vaginal discharge - clear, Wet prep and DNA probe for chlamydia and GC obtained.   ADNEXA: normal adnexa in size, nontender and no masses UTERUS: uterus is normal size, shape, consistency and nontender.    ED Course  Procedures (including critical care time) Medications  phenazopyridine (PYRIDIUM) tablet 200 mg (200 mg Oral Given 11/21/14 1804)    Labs Review Labs Reviewed  COMPREHENSIVE METABOLIC PANEL - Abnormal; Notable for the following:    Glucose, Bld 135 (*)    Total Bilirubin 0.2 (*)    All other components within normal limits  URINALYSIS, ROUTINE W REFLEX MICROSCOPIC (NOT AT [facility]) - Abnormal; Notable for the following:    APPearance CLOUDY (*)    All other components within normal limits  WET PREP, GENITAL  CBC WITH DIFFERENTIAL/PLATELET  LIPASE, BLOOD  GC/CHLAMYDIA PROBE AMP ([place])  NOT AT [facility]    Imaging Review No results found.   EKG Interpretation None      MDM   Final diagnoses:  Vulvovaginal candidiasis    Filed Vitals:   11/21/14 1945  BP:   Pulse:   Temp: 98.3 F (36.8 C)  Resp:    Afebrile, NAD, non-toxic appearing, AAOx4.   I have reviewed nursing notes, vital signs, and all appropriate lab and imaging results if ordered as above.   Patient with vulvovaginal candidiasis. Abdomen is soft, nontender, nondistended. No peritoneal signs. labs reviewed without acute abnormality. UA reviewed without evidence of UTI. Wet prep and GC chlamydia obtained. Wet prep unremarkable. Will prescribe nystatin cream for treatment. Patient able to tolerate PO intake in the ED. Return precautions discussed. Patient is agreeable to plan. Patient is stable at time of discharge    Francee PiccoloJennifer Fia Hebert, PA-C 11/21/14 2016  Gilda Creasehristopher J Pollina, MD 11/22/14 [phone]

## 2014-11-21 NOTE — ED Notes (Signed)
The pt thinks she has a uti.  She has itching and painful urination and she thinks she has a yeast infection.  No nv.  She has had these symptoms foir w2 weeks

## 2014-11-23 LAB — GC/CHLAMYDIA PROBE AMP (~~LOC~~) NOT AT ARMC
CHLAMYDIA, DNA PROBE: NEGATIVE
NEISSERIA GONORRHEA: NEGATIVE

## 2015-01-03 ENCOUNTER — Encounter (HOSPITAL_COMMUNITY): Payer: Self-pay

## 2015-01-03 ENCOUNTER — Emergency Department (HOSPITAL_COMMUNITY)
Admission: EM | Admit: 2015-01-03 | Discharge: 2015-01-03 | Disposition: A | Discharge status: Home / Self Care | Payer: BLUE CROSS/BLUE SHIELD | Attending: Emergency Medicine | Admitting: Emergency Medicine

## 2015-01-03 DIAGNOSIS — Z79899 Other long term (current) drug therapy: Secondary | ICD-10-CM | POA: Insufficient documentation

## 2015-01-03 DIAGNOSIS — W548XXA Other contact with dog, initial encounter: Secondary | ICD-10-CM | POA: Insufficient documentation

## 2015-01-03 DIAGNOSIS — E119 Type 2 diabetes mellitus without complications: Secondary | ICD-10-CM | POA: Diagnosis not present

## 2015-01-03 DIAGNOSIS — Y9289 Other specified places as the place of occurrence of the external cause: Secondary | ICD-10-CM | POA: Diagnosis not present

## 2015-01-03 DIAGNOSIS — I10 Essential (primary) hypertension: Secondary | ICD-10-CM | POA: Diagnosis not present

## 2015-01-03 DIAGNOSIS — Y998 Other external cause status: Secondary | ICD-10-CM | POA: Insufficient documentation

## 2015-01-03 DIAGNOSIS — Z880 Allergy status to penicillin: Secondary | ICD-10-CM | POA: Diagnosis not present

## 2015-01-03 DIAGNOSIS — Y9389 Activity, other specified: Secondary | ICD-10-CM | POA: Diagnosis not present

## 2015-01-03 DIAGNOSIS — E780 Pure hypercholesterolemia: Secondary | ICD-10-CM | POA: Diagnosis not present

## 2015-01-03 DIAGNOSIS — S0591XA Unspecified injury of right eye and orbit, initial encounter: Secondary | ICD-10-CM | POA: Diagnosis present

## 2015-01-03 DIAGNOSIS — S0501XA Injury of conjunctiva and corneal abrasion without foreign body, right eye, initial encounter: Secondary | ICD-10-CM

## 2015-01-03 LAB — CBG MONITORING, ED: Glucose-Capillary: 178 mg/dL — ABNORMAL HIGH (ref 65–99)

## 2015-01-03 MED ORDER — HYDROCODONE-ACETAMINOPHEN 5-325 MG PO TABS: 1.0000 | ORAL_TABLET | Freq: Four times a day (QID) | ORAL | Status: DC | PRN

## 2015-01-03 MED ORDER — FLUORESCEIN SODIUM 1 MG OP STRP
1.0000 | ORAL_STRIP | Freq: Once | OPHTHALMIC | Status: AC
  Administered 2015-01-03: 1 via OPHTHALMIC
  Filled 2015-01-03: qty 1

## 2015-01-03 MED ORDER — POLYMYXIN B-TRIMETHOPRIM 10000-0.1 UNIT/ML-% OP SOLN: 1.0000 [drp] | OPHTHALMIC | Status: DC

## 2015-01-03 MED ORDER — TETRACAINE HCL 0.5 % OP SOLN
1.0000 [drp] | Freq: Once | OPHTHALMIC | Status: AC
  Administered 2015-01-03: 1 [drp] via OPHTHALMIC
  Filled 2015-01-03: qty 2

## 2015-01-03 NOTE — ED Notes (Signed)
Pt stable, ambulatory, states understanding of discharge instructions 

## 2015-01-03 NOTE — ED Provider Notes (Signed)
CSN: [id number]     Arrival date & time 01/03/15  1928 History   First MD Initiated Contact with Patient 01/03/15 1949     Chief Complaint  Patient presents with  . Eye Injury     (Consider location/radiation/quality/duration/timing/severity/associated sxs/prior Treatment) HPI Comments: Patient presents to the emergency department with chief complaint of right eye pain. She states that she was scratched by her dog earlier today. She complains of irritation to the right eye. She states that it is painful when she opens dry. She complains of mild to moderate photophobia. She reports mild-to-moderate watery discharge. 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  The history is provided by the patient. No language interpreter was used.    Past Medical History  Diagnosis Date  . Diabetes mellitus   . Hypertension   . Hypercholesteremia    Past Surgical History  Procedure Laterality Date  . Abdominal surgery     History reviewed. No pertinent family history. Social History  Substance Use Topics  . Smoking status: Never Smoker   . Smokeless tobacco: None  . Alcohol Use: No   OB History    No data available     Review of Systems  Eyes: Positive for photophobia and pain. Negative for itching.  Respiratory: Negative for shortness of breath.   Gastrointestinal: Negative for diarrhea and constipation.  Genitourinary: Negative for dysuria.      Allergies  Penicillins  Home Medications   Prior to Admission medications   Medication Sig Start Date End Date Taking? Authorizing Provider  acyclovir (ZOVIRAX) 400 MG tablet Take 1 tablet (400 mg total) by mouth 5 (five) times daily. 04/11/14   Renne Crigler, PA-C  atorvastatin (LIPITOR) 40 MG tablet Take 40 mg by mouth daily.      Historical Provider, MD  DULoxetine (CYMBALTA) 60 MG capsule Take 60 mg by mouth daily.      Historical Provider, MD  fexofenadine (ALLEGRA) 180 MG tablet Take 180 mg  by mouth daily.      Historical Provider, MD  glipiZIDE (GLUCOTROL) 10 MG tablet Take 10 mg by mouth 2 (two) times daily before a meal.      Historical Provider, MD  HYDROcodone-acetaminophen (NORCO/VICODIN) 5-325 MG per tablet Take 1-2 tablets by mouth every 6 (six) hours as needed. 01/03/15   Roxy Horseman, PA-C  lisinopril-hydrochlorothiazide (PRINZIDE,ZESTORETIC) 10-12.5 MG per tablet Take 1 tablet by mouth daily.      Historical Provider, MD  LORazepam (ATIVAN) 2 MG tablet Take 2 mg by mouth 2 (two) times daily.      Historical Provider, MD  nystatin cream (MYCOSTATIN) Apply to affected area 2 times daily 11/21/14   Francee Piccolo, PA-C  phenytoin (DILANTIN) 100 MG ER capsule Take 400 mg by mouth 2 (two) times daily.      Historical Provider, MD  sitaGLIPtan-metformin (JANUMET) 50-1000 MG per tablet Take 1 tablet by mouth 2 (two) times daily with a meal.      Historical Provider, MD  trimethoprim-polymyxin b (POLYTRIM) ophthalmic solution Place 1 drop into the right eye every 4 (four) hours. 01/03/15   Roxy Horseman, PA-C  zolpidem (AMBIEN) 10 MG tablet Take 10 mg by mouth at bedtime as needed.      Historical Provider, MD   BP 166/61 mmHg  Pulse 97  Temp(Src) 98 F (36.7 C) (Oral)  Resp 18  SpO2 97% Physical Exam  Constitutional: She is oriented to person, place, and time. She appears well-developed and  well-nourished.  HENT:  Head: Normocephalic and atraumatic.  Eyes: Conjunctivae and EOM are normal. Pupils are equal, round, and reactive to light.    Right corneal abrasion in center of eye, no hyphema, no seidel sign, no foreign body, no entrapment  Neck: Normal range of motion. Neck supple.  Cardiovascular: Normal rate and regular rhythm.   Pulmonary/Chest: Effort normal and breath sounds normal. No respiratory distress.  Abdominal: Soft. She exhibits no distension.  Musculoskeletal: Normal range of motion. She exhibits no edema or tenderness.  Neurological: She is alert  and oriented to person, place, and time.  Skin: Skin is warm and dry.  Psychiatric: She has a normal mood and affect. Her behavior is normal. Judgment and thought content normal.  Nursing note and vitals reviewed.   ED Course  Procedures (including critical care time) Labs Review Labs Reviewed  CBG MONITORING, ED - Abnormal; Notable for the following:    Glucose-Capillary 178 (*)    All other components within normal limits    Imaging Review No results found. I have personally reviewed and evaluated these images and lab results as part of my medical decision-making.   EKG Interpretation None      MDM   Final diagnoses:  Corneal abrasion, right, initial encounter    Patient with corneal abrasion from dog scratch.  Recommend close follow-up with ophthalmology, discharge with Polytrim and Vicodin.    Roxy Horseman, PA-C 01/03/15 2154  Gwyneth Sprout, MD 01/03/15 2322

## 2015-01-03 NOTE — ED Notes (Signed)
Onset several hours ago pts dog scratched her in the eye.  Eye is painful, watery and light sensitivity.  Pt is very anxious about situation.

## 2015-01-03 NOTE — Discharge Instructions (Signed)
Corneal Abrasion °The cornea is the clear covering at the front and center of the eye. When looking at the colored portion of the eye (iris), you are looking through the cornea. This very thin tissue is made up of many layers. The surface layer is a single layer of cells (corneal epithelium) and is one of the most sensitive tissues in the body. If a scratch or injury causes the corneal epithelium to come off, it is called a corneal abrasion. If the injury extends to the tissues below the epithelium, the condition is called a corneal ulcer. °CAUSES  °· Scratches. °· Trauma. °· Foreign body in the eye. °Some people have recurrences of abrasions in the area of the original injury even after it has healed (recurrent erosion syndrome). Recurrent erosion syndrome generally improves and goes away with time. °SYMPTOMS  °· Eye pain. °· Difficulty or inability to keep the injured eye open. °· The eye becomes very sensitive to light. °· Recurrent erosions tend to happen suddenly, first thing in the morning, usually after waking up and opening the eye. °DIAGNOSIS  °Your health care provider can diagnose a corneal abrasion during an eye exam. Dye is usually placed in the eye using a drop or a small paper strip moistened by your tears. When the eye is examined with a special light, the abrasion shows up clearly because of the dye. °TREATMENT  °· Small abrasions may be treated with antibiotic drops or ointment alone. °· A pressure patch may be put over the eye. If this is done, follow your doctor's instructions for when to remove the patch. Do not drive or use machines while the eye patch is on. Judging distances is hard to do with a patch on. °If the abrasion becomes infected and spreads to the deeper tissues of the cornea, a corneal ulcer can result. This is serious because it can cause corneal scarring. Corneal scars interfere with light passing through the cornea and cause a loss of vision in the involved eye. °HOME CARE  INSTRUCTIONS °· Use medicine or ointment as directed. Only take over-the-counter or prescription medicines for pain, discomfort, or fever as directed by your health care provider. °· Do not drive or operate machinery if your eye is patched. Your ability to judge distances is impaired. °· If your health care provider has given you a follow-up appointment, it is very important to keep that appointment. Not keeping the appointment could result in a severe eye infection or permanent loss of vision. If there is any problem keeping the appointment, let your health care provider know. °SEEK MEDICAL CARE IF:  °· You have pain, light sensitivity, and a scratchy feeling in one eye or both eyes. °· Your pressure patch keeps loosening up, and you can blink your eye under the patch after treatment. °· Any kind of discharge develops from the eye after treatment or if the lids stick together in the morning. °· You have the same symptoms in the morning as you did with the original abrasion days, weeks, or months after the abrasion healed. °MAKE SURE YOU:  °· Understand these instructions. °· Will watch your condition. °· Will get help right away if you are not doing well or get worse. °Document Released: 04/28/2000 Document Revised: 05/06/2013 Document Reviewed: 01/06/2013 °ExitCare® Patient Information ©2015 [employer]. This information is not intended to replace advice given to you by your health care provider. Make sure you discuss any questions you have with your health care provider. ° °

## 2015-03-07 ENCOUNTER — Encounter (HOSPITAL_COMMUNITY): Payer: Self-pay | Admitting: *Deleted

## 2015-03-07 ENCOUNTER — Emergency Department (HOSPITAL_COMMUNITY): Payer: Medicare Other

## 2015-03-07 ENCOUNTER — Emergency Department (HOSPITAL_COMMUNITY)
Admission: EM | Admit: 2015-03-07 | Discharge: 2015-03-07 | Disposition: A | Discharge status: Home / Self Care | Payer: Medicare Other | Attending: Emergency Medicine | Admitting: Emergency Medicine

## 2015-03-07 DIAGNOSIS — Z880 Allergy status to penicillin: Secondary | ICD-10-CM | POA: Insufficient documentation

## 2015-03-07 DIAGNOSIS — E119 Type 2 diabetes mellitus without complications: Secondary | ICD-10-CM | POA: Insufficient documentation

## 2015-03-07 DIAGNOSIS — Z79899 Other long term (current) drug therapy: Secondary | ICD-10-CM | POA: Insufficient documentation

## 2015-03-07 DIAGNOSIS — E782 Mixed hyperlipidemia: Secondary | ICD-10-CM | POA: Insufficient documentation

## 2015-03-07 DIAGNOSIS — M5412 Radiculopathy, cervical region: Secondary | ICD-10-CM | POA: Diagnosis not present

## 2015-03-07 DIAGNOSIS — I10 Essential (primary) hypertension: Secondary | ICD-10-CM | POA: Insufficient documentation

## 2015-03-07 DIAGNOSIS — M25511 Pain in right shoulder: Secondary | ICD-10-CM | POA: Diagnosis present

## 2015-03-07 MED ORDER — CYCLOBENZAPRINE HCL 10 MG PO TABS: 10.0000 mg | ORAL_TABLET | Freq: Two times a day (BID) | ORAL | Status: DC | PRN

## 2015-03-07 MED ORDER — HYDROCODONE-ACETAMINOPHEN 5-325 MG PO TABS: 1.0000 | ORAL_TABLET | ORAL | Status: DC | PRN

## 2015-03-07 MED ORDER — HYDROMORPHONE HCL 1 MG/ML IJ SOLN
1.0000 mg | INTRAMUSCULAR | Status: DC | PRN
  Administered 2015-03-07 (×2): 1 mg via INTRAVENOUS
  Filled 2015-03-07 (×2): qty 1

## 2015-03-07 MED ORDER — ONDANSETRON HCL 4 MG/2ML IJ SOLN
4.0000 mg | Freq: Once | INTRAMUSCULAR | Status: AC
  Administered 2015-03-07: 4 mg via INTRAVENOUS
  Filled 2015-03-07: qty 2

## 2015-03-07 NOTE — ED Notes (Signed)
Pt reports to the ED for eval of right shoulder pain that started last pm. Thinks she may have pulled a muscle. Has tried Tramadol but it did not help. CMS intact. Is also under a lot of stress bc her husband is on life support. Pt A&Ox4, resp e/u, and skin warm and dry.

## 2015-03-07 NOTE — ED Notes (Signed)
The pt is c/o  Rt shoulder pain since last pm.  No known injury.  She has been under a lot of stress for the past week her [facility] is in [facility].  The pt has beena lone.  She has pain with movement

## 2015-03-07 NOTE — Discharge Instructions (Signed)

## 2015-03-07 NOTE — ED Notes (Signed)
MD at bedside. 

## 2015-03-07 NOTE — ED Provider Notes (Signed)
CSN: [id number]     Arrival date & time 03/07/15  1912 History   First MD Initiated Contact with Patient 03/07/15 2102     Chief Complaint  Patient presents with  . Shoulder Pain   HPI Patient presents to the emergency room with complaints of right shoulder pain.  Patient states the symptoms started last evening. She's not sure what triggered it but she was shopping yesterday and picking up a lot of heavy objects. Pain gradually increased. It is primarily in her right trapezius and shoulder region. Any movement of her arm causes severe pain. She denies any numbness or weakness. No fevers or chills. Past Medical History  Diagnosis Date  . Diabetes mellitus   . Hypertension   . Hypercholesteremia    Past Surgical History  Procedure Laterality Date  . Abdominal surgery     No family history on file. Social History  Substance Use Topics  . Smoking status: Never Smoker   . Smokeless tobacco: None  . Alcohol Use: No   OB History    No data available     Review of Systems  All other systems reviewed and are negative.     Allergies  Penicillins  Home Medications   Prior to Admission medications   Medication Sig Start Date End Date Taking? Authorizing Provider  acyclovir (ZOVIRAX) 400 MG tablet Take 1 tablet (400 mg total) by mouth 5 (five) times daily. 04/11/14   Renne CriglerJoshua Geiple, PA-C  atorvastatin (LIPITOR) 40 MG tablet Take 40 mg by mouth daily.      Historical Provider, MD  DULoxetine (CYMBALTA) 60 MG capsule Take 60 mg by mouth daily.      Historical Provider, MD  fexofenadine (ALLEGRA) 180 MG tablet Take 180 mg by mouth daily.      Historical Provider, MD  glipiZIDE (GLUCOTROL) 10 MG tablet Take 10 mg by mouth 2 (two) times daily before a meal.      Historical Provider, MD  HYDROcodone-acetaminophen (NORCO/VICODIN) 5-325 MG per tablet Take 1-2 tablets by mouth every 6 (six) hours as needed. 01/03/15   Roxy Horsemanobert Browning, PA-C  lisinopril-hydrochlorothiazide  (PRINZIDE,ZESTORETIC) 10-12.5 MG per tablet Take 1 tablet by mouth daily.      Historical Provider, MD  LORazepam (ATIVAN) 2 MG tablet Take 2 mg by mouth 2 (two) times daily.      Historical Provider, MD  nystatin cream (MYCOSTATIN) Apply to affected area 2 times daily 11/21/14   Francee PiccoloJennifer Piepenbrink, PA-C  phenytoin (DILANTIN) 100 MG ER capsule Take 400 mg by mouth 2 (two) times daily.      Historical Provider, MD  sitaGLIPtan-metformin (JANUMET) 50-1000 MG per tablet Take 1 tablet by mouth 2 (two) times daily with a meal.      Historical Provider, MD  trimethoprim-polymyxin b (POLYTRIM) ophthalmic solution Place 1 drop into the right eye every 4 (four) hours. 01/03/15   Roxy Horsemanobert Browning, PA-C  zolpidem (AMBIEN) 10 MG tablet Take 10 mg by mouth at bedtime as needed.      Historical Provider, MD   BP 122/56 mmHg  Pulse 92  Temp(Src) 98.7 F (37.1 C) (Oral)  Resp 16  Ht 5\' 5"  (1.651 m)  Wt 283 lb (128.368 kg)  BMI 47.09 kg/m2  SpO2 94% Physical Exam  Constitutional: She appears well-developed and well-nourished. No distress.  Morbidly obese  HENT:  Head: Normocephalic and atraumatic.  Right Ear: External ear normal.  Left Ear: External ear normal.  Eyes: Conjunctivae are normal. Right eye exhibits no discharge.  Left eye exhibits no discharge. No scleral icterus.  Neck: Neck supple. No tracheal deviation present.  Cardiovascular: Normal rate.   Pulmonary/Chest: Effort normal. No stridor. No respiratory distress.  Musculoskeletal: She exhibits no edema.       Right shoulder: She exhibits tenderness, bony tenderness and pain. She exhibits no deformity and no spasm.  Strong radial pulse, extremity is warm and well perfused; patient has diffuse tenderness from her trapezius down to her forearm any palpation or manipulation of her extremity causes pain, COMPARTMENTS are soft, she has no edema or erythema of the shoulder or upper arm or forearm  Neurological: She is alert. Cranial nerve deficit:  no gross deficits.  Equal grip strength bilaterally, normal sensation to light touch  Skin: Skin is warm and dry. No rash noted.  Psychiatric: She has a normal mood and affect.  Nursing note and vitals reviewed.   ED Course  Procedures (including critical care time) Labs Review Labs Reviewed - No data to display  Imaging Review Dg Cervical Spine Complete  03/07/2015  CLINICAL DATA:  Acute onset of right shoulder and arm pain for 2 days. Initial encounter. EXAM: CERVICAL SPINE - COMPLETE 4+ VIEW COMPARISON:  None. FINDINGS: There is no evidence of fracture or subluxation. Vertebral bodies demonstrate normal height and alignment. The lower cervical spine is not well assessed due to overlying soft tissues. Intervertebral disc spaces are preserved. Prevertebral soft tissues are within normal limits. The provided odontoid view demonstrates no significant abnormality. The visualized lung apices are clear. IMPRESSION: No evidence of fracture or subluxation along the cervical spine. Electronically Signed   By: Roanna Raider M.D.   On: 03/07/2015 22:21   Dg Shoulder Right  03/07/2015  ADDENDUM REPORT: 03/07/2015 23:23 ADDENDUM: The original dictation for this report was done in error. This is the correct dictation for the right shoulder radiographs dated 03/07/2015. COMPARISON:  No priors. FINDINGS: No acute displaced fracture, subluxation or dislocation. Mild degenerative changes of osteoarthritis are noted at the acromioclavicular joint. IMPRESSION: No acute radiographic abnormality of the right shoulder. Electronically Signed   By: Trudie Reed M.D.   On: 03/07/2015 23:23  03/07/2015  CLINICAL DATA:  64 year old female with right shoulder and arm pain for the past 2 days. EXAM: RIGHT SHOULDER - 2+ VIEW COMPARISON:  No priors. FINDINGS: Assessment is limited by the patient's large body habitus. The cervical spine is visualized only to the level of the superior aspect of C5 on the lateral view and  C6 on the oblique views. With these limitations in mind, the visualized portions of the cervical spine demonstrate no acute displaced fracture. Alignment appears anatomic. Prevertebral soft tissues are normal. There is multilevel degenerative disc disease and facet arthropathy, which is poorly evaluated, but appears most severe at C4-C5 and C5-C6. IMPRESSION: 1. Limited study demonstrating no definite acute radiographic abnormality of the cervical spine through C6. 2. Multilevel degenerative disc disease and cervical spondylosis, as above. Electronically Signed: By: Trudie Reed M.D. On: 03/07/2015 22:33   I have personally reviewed and evaluated these images and lab results as part of my medical decision-making.   MDM   Final diagnoses:  Cervical radiculopathy   There is no evidence of infection. She has normal pulses. Patient's x-rays show some degenerative disc disease. I suspect her symptoms are related to a cervical radiculopathy.   We'll discharge home with medications for pain and a muscle relaxant.   Linwood Dibbles, MD 03/07/15 [id number]

## 2016-01-05 ENCOUNTER — Encounter (HOSPITAL_COMMUNITY): Payer: Self-pay | Admitting: Emergency Medicine

## 2016-01-05 DIAGNOSIS — E119 Type 2 diabetes mellitus without complications: Secondary | ICD-10-CM | POA: Insufficient documentation

## 2016-01-05 DIAGNOSIS — N898 Other specified noninflammatory disorders of vagina: Secondary | ICD-10-CM | POA: Insufficient documentation

## 2016-01-05 DIAGNOSIS — Z7984 Long term (current) use of oral hypoglycemic drugs: Secondary | ICD-10-CM | POA: Insufficient documentation

## 2016-01-05 DIAGNOSIS — Z5321 Procedure and treatment not carried out due to patient leaving prior to being seen by health care provider: Secondary | ICD-10-CM | POA: Insufficient documentation

## 2016-01-05 DIAGNOSIS — I10 Essential (primary) hypertension: Secondary | ICD-10-CM | POA: Insufficient documentation

## 2016-01-05 LAB — CBC WITH DIFFERENTIAL/PLATELET
BASOS PCT: 0 %
Basophils Absolute: 0 10*3/uL (ref 0.0–0.1)
Eosinophils Absolute: 0.2 10*3/uL (ref 0.0–0.7)
Eosinophils Relative: 2 %
HEMATOCRIT: 42.7 % (ref 36.0–46.0)
Hemoglobin: 14 g/dL (ref 12.0–15.0)
Lymphocytes Relative: 33 %
Lymphs Abs: 2.7 10*3/uL (ref 0.7–4.0)
MCH: 28.3 pg (ref 26.0–34.0)
MCHC: 32.8 g/dL (ref 30.0–36.0)
MCV: 86.4 fL (ref 78.0–100.0)
MONO ABS: 0.6 10*3/uL (ref 0.1–1.0)
MONOS PCT: 7 %
NEUTROS ABS: 4.8 10*3/uL (ref 1.7–7.7)
Neutrophils Relative %: 58 %
Platelets: 324 10*3/uL (ref 150–400)
RBC: 4.94 MIL/uL (ref 3.87–5.11)
RDW: 13.4 % (ref 11.5–15.5)
WBC: 8.3 10*3/uL (ref 4.0–10.5)

## 2016-01-05 LAB — BASIC METABOLIC PANEL
Anion gap: 5 (ref 5–15)
BUN: 14 mg/dL (ref 6–20)
CALCIUM: 9.3 mg/dL (ref 8.9–10.3)
CO2: 28 mmol/L (ref 22–32)
CREATININE: 0.86 mg/dL (ref 0.44–1.00)
Chloride: 108 mmol/L (ref 101–111)
GFR calc non Af Amer: >60 mL/min (ref >60)
Glucose, Bld: 73 mg/dL (ref 65–99)
Potassium: 3.8 mmol/L (ref 3.5–5.1)
Sodium: 141 mmol/L (ref 135–145)

## 2016-01-05 LAB — URINALYSIS, ROUTINE W REFLEX MICROSCOPIC
Bilirubin Urine: NEGATIVE
GLUCOSE, UA: NEGATIVE mg/dL
HGB URINE DIPSTICK: NEGATIVE
KETONES UR: NEGATIVE mg/dL
Leukocytes, UA: NEGATIVE
Nitrite: NEGATIVE
PH: 6.5 (ref 5.0–8.0)
Protein, ur: NEGATIVE mg/dL
Specific Gravity, Urine: 1.021 (ref 1.005–1.030)

## 2016-01-05 NOTE — ED Triage Notes (Signed)
Pt. reports " yeast" infection with vaginal discharge ,  itching "burning" this week , denies dysuria , no fever or chills.

## 2016-01-06 ENCOUNTER — Emergency Department (HOSPITAL_COMMUNITY)
Admission: EM | Admit: 2016-01-06 | Discharge: 2016-01-06 | Disposition: A | Discharge status: Home / Self Care | Payer: Medicare HMO | Attending: Emergency Medicine | Admitting: Emergency Medicine

## 2016-01-06 HISTORY — DX: Obesity, unspecified: E66.9

## 2016-04-17 ENCOUNTER — Emergency Department (HOSPITAL_COMMUNITY)
Admission: EM | Admit: 2016-04-17 | Discharge: 2016-04-17 | Disposition: A | Discharge status: Home / Self Care | Payer: Medicare HMO | Attending: Emergency Medicine | Admitting: Emergency Medicine

## 2016-04-17 ENCOUNTER — Encounter (HOSPITAL_COMMUNITY): Payer: Self-pay

## 2016-04-17 DIAGNOSIS — Y999 Unspecified external cause status: Secondary | ICD-10-CM | POA: Diagnosis not present

## 2016-04-17 DIAGNOSIS — S3992XA Unspecified injury of lower back, initial encounter: Secondary | ICD-10-CM | POA: Diagnosis present

## 2016-04-17 DIAGNOSIS — I10 Essential (primary) hypertension: Secondary | ICD-10-CM | POA: Insufficient documentation

## 2016-04-17 DIAGNOSIS — Y939 Activity, unspecified: Secondary | ICD-10-CM | POA: Insufficient documentation

## 2016-04-17 DIAGNOSIS — E119 Type 2 diabetes mellitus without complications: Secondary | ICD-10-CM | POA: Diagnosis not present

## 2016-04-17 DIAGNOSIS — M545 Low back pain, unspecified: Secondary | ICD-10-CM

## 2016-04-17 DIAGNOSIS — Y929 Unspecified place or not applicable: Secondary | ICD-10-CM | POA: Insufficient documentation

## 2016-04-17 DIAGNOSIS — W1839XA Other fall on same level, initial encounter: Secondary | ICD-10-CM | POA: Insufficient documentation

## 2016-04-17 DIAGNOSIS — Z79899 Other long term (current) drug therapy: Secondary | ICD-10-CM | POA: Insufficient documentation

## 2016-04-17 DIAGNOSIS — W19XXXA Unspecified fall, initial encounter: Secondary | ICD-10-CM

## 2016-04-17 MED ORDER — BUPIVACAINE HCL (PF) 0.5 % IJ SOLN
20.0000 mL | Freq: Once | INTRAMUSCULAR | Status: AC
  Administered 2016-04-17: 20 mL
  Filled 2016-04-17: qty 20

## 2016-04-17 NOTE — ED Provider Notes (Signed)
[facility] Provider Note   CSN: [id number] Arrival date & time: 04/17/16  1300     History   Chief Complaint Chief Complaint  Patient presents with  . Fall  . Back Pain  . Abdominal Pain    HPI Donna Snow is a 65 y.o. female.  The history is provided by the patient.  Back Pain   This is a chronic (worse since falling this morning) problem. The current episode started 1 to 2 hours ago. The problem occurs constantly. The problem has not changed since onset.The pain is associated with falling. The pain is present in the lumbar spine. The quality of the pain is described as aching. The pain does not radiate. The pain is moderate. The symptoms are aggravated by bending and twisting. The pain is the same all the time. Associated symptoms include abdominal pain (states it feels like her back pain moving across). Pertinent negatives include no chest pain, no fever, no numbness, no bowel incontinence, no perianal numbness, no bladder incontinence, no paresthesias, no paresis and no weakness. She has tried nothing for the symptoms. Risk factors include obesity.    Past Medical History:  Diagnosis Date  . Diabetes mellitus   . Hypercholesteremia   . Hypertension   . Obesity     There are no active problems to display for this patient.   Past Surgical History:  Procedure Laterality Date  . ABDOMINAL HYSTERECTOMY    . ABDOMINAL SURGERY    . CHOLECYSTECTOMY    . JOINT REPLACEMENT      OB History    No data available       Home Medications    Prior to Admission medications   Medication Sig Start Date End Date Taking? Authorizing Provider  acyclovir (ZOVIRAX) 400 MG tablet Take 1 tablet (400 mg total) by mouth 5 (five) times daily. 04/11/14   Renne Crigler, PA-C  atorvastatin (LIPITOR) 40 MG tablet Take 40 mg by mouth daily.      Historical Provider, MD  cyclobenzaprine (FLEXERIL) 10 MG tablet Take 1 tablet (10 mg total) by mouth 2 (two) times daily as needed for  muscle spasms. 03/07/15   Linwood Dibbles, MD  DULoxetine (CYMBALTA) 60 MG capsule Take 60 mg by mouth daily.      Historical Provider, MD  fexofenadine (ALLEGRA) 180 MG tablet Take 180 mg by mouth daily.      Historical Provider, MD  glipiZIDE (GLUCOTROL) 10 MG tablet Take 10 mg by mouth 2 (two) times daily before a meal.      Historical Provider, MD  HYDROcodone-acetaminophen (NORCO/VICODIN) 5-325 MG tablet Take 1 tablet by mouth every 4 (four) hours as needed. 03/07/15   Linwood Dibbles, MD  lisinopril-hydrochlorothiazide (PRINZIDE,ZESTORETIC) 10-12.5 MG per tablet Take 1 tablet by mouth daily.      Historical Provider, MD  LORazepam (ATIVAN) 2 MG tablet Take 2 mg by mouth 2 (two) times daily.      Historical Provider, MD  nystatin cream (MYCOSTATIN) Apply to affected area 2 times daily 11/21/14   Francee Piccolo, PA-C  phenytoin (DILANTIN) 100 MG ER capsule Take 400 mg by mouth 2 (two) times daily.      Historical Provider, MD  sitaGLIPtan-metformin (JANUMET) 50-1000 MG per tablet Take 1 tablet by mouth 2 (two) times daily with a meal.      Historical Provider, MD  trimethoprim-polymyxin b (POLYTRIM) ophthalmic solution Place 1 drop into the right eye every 4 (four) hours. 01/03/15   Roxy Horseman, PA-C  zolpidem (AMBIEN) 10 MG tablet Take 10 mg by mouth at bedtime as needed.      Historical Provider, MD    Family History No family history on file.  Social History Social History  Substance Use Topics  . Smoking status: Never Smoker  . Smokeless tobacco: Never Used  . Alcohol use No     Allergies   Penicillins   Review of Systems Review of Systems  Constitutional: Negative for fever.  Cardiovascular: Negative for chest pain.  Gastrointestinal: Positive for abdominal pain (states it feels like her back pain moving across). Negative for bowel incontinence.  Genitourinary: Negative for bladder incontinence.  Musculoskeletal: Positive for back pain.  Neurological: Negative for weakness,  numbness and paresthesias.  All other systems reviewed and are negative.    Physical Exam Updated Vital Signs BP 149/82   Pulse 70   Temp 98 F (36.7 C) (Oral)   Resp 14   Ht 5\' 3"  (1.6 m)   Wt 283 lb (128.4 kg)   SpO2 98%   BMI 50.13 kg/m   Physical Exam  Constitutional: She is oriented to person, place, and time. She appears well-developed and well-nourished. No distress.  HENT:  Head: Normocephalic and atraumatic.  Nose: Nose normal.  Eyes: Conjunctivae are normal.  Neck: Neck supple. No tracheal deviation present.  Cardiovascular: Normal rate and regular rhythm.   Pulmonary/Chest: Effort normal. No respiratory distress.  Abdominal: Soft. She exhibits no distension.  Musculoskeletal:       Lumbar back: She exhibits tenderness. She exhibits no bony tenderness.       Back:  Neurological: She is alert and oriented to person, place, and time. She has normal strength. No sensory deficit. Coordination normal.  Skin: Skin is warm and dry.  Psychiatric: She has a normal mood and affect.     ED Treatments / Results  Labs (all labs ordered are listed, but only abnormal results are displayed) Labs Reviewed - No data to display  EKG  EKG Interpretation None       Radiology No results found.  Procedures Procedures (including critical care time)  Procedure Note: Trigger Point Injection for Myofascial pain  Performed by Dr. Clydene PughKnott Indication: muscle/myofascial pain Muscle body and tendon sheath of the bilateral lumbar paraspinal muscle(s) were injected with 0.5% bupivacaine under sterile technique for release of muscle spasm/pain. Patient tolerated well with immediate improvement of symptoms and no immediate complications following procedure.  CPT Code:   1 or 2 muscle bodies: 20552   Medications Ordered in ED Medications  bupivacaine (MARCAINE) 0.5 % injection 20 mL (20 mLs Infiltration Given 04/17/16 1400)     Initial Impression / Assessment and Plan / ED  Course  I have reviewed the triage vital signs and the nursing notes.  Pertinent labs & imaging results that were available during my care of the patient were reviewed by me and considered in my medical decision making (see chart for details).  Clinical Course     65 y.o. female presents with fall from standing after tripping over a pillow. She hit her head moving forward and landed on her bottom sustaining acute on chronic back pain. She states she has MR due tomorrow for disc disease. No red flag symptoms of fever, weight loss, saddle anesthesia, weakness, fecal/urinary incontinence or urinary retention. She is able to sit up under her own power with minimal assistance. She has low back tenderness lateral to the spine and has no signs of serious injury. Local anaesthesia was  applied with improvement. She sees pain management and I recommended she reach out to them regarding pain medicine prescriptions if refractory to supportive care. Patient was recommended to take short course of scheduled NSAIDs and engage in early mobility as definitive treatment. Plan to follow up with PCP as needed and return precautions discussed for worsening or new concerning symptoms.   Final Clinical Impressions(s) / ED Diagnoses   Final diagnoses:  Fall from standing, initial encounter  Bilateral low back pain without sciatica, unspecified chronicity    New Prescriptions New Prescriptions   No medications on file     Lyndal Pulleyaniel Laquitha Heslin, MD 04/17/16 1930

## 2016-04-17 NOTE — ED Triage Notes (Signed)
Pt arrives EMS with c/o fall from standing at home c/o pain at back pain and abdominal pain. Pt also states hx of slipped disc. Given fentanyl intranasal PTA

## 2017-12-11 DIAGNOSIS — G4733 Obstructive sleep apnea (adult) (pediatric): Secondary | ICD-10-CM | POA: Diagnosis present

## 2018-05-27 DIAGNOSIS — F411 Generalized anxiety disorder: Secondary | ICD-10-CM | POA: Diagnosis present

## 2018-12-12 DIAGNOSIS — M47816 Spondylosis without myelopathy or radiculopathy, lumbar region: Secondary | ICD-10-CM | POA: Insufficient documentation

## 2019-07-18 DIAGNOSIS — M47812 Spondylosis without myelopathy or radiculopathy, cervical region: Secondary | ICD-10-CM | POA: Insufficient documentation

## 2019-11-27 ENCOUNTER — Ambulatory Visit (HOSPITAL_COMMUNITY)
Admission: EM | Admit: 2019-11-27 | Discharge: 2019-11-27 | Disposition: A | Discharge status: Home / Self Care | Payer: Medicare HMO | Attending: Family Medicine | Admitting: Family Medicine

## 2019-11-27 ENCOUNTER — Other Ambulatory Visit: Payer: Self-pay

## 2019-11-27 ENCOUNTER — Encounter (HOSPITAL_COMMUNITY): Payer: Self-pay | Admitting: Emergency Medicine

## 2019-11-27 DIAGNOSIS — L0291 Cutaneous abscess, unspecified: Secondary | ICD-10-CM | POA: Diagnosis not present

## 2019-11-27 DIAGNOSIS — E119 Type 2 diabetes mellitus without complications: Secondary | ICD-10-CM | POA: Diagnosis present

## 2019-11-27 DIAGNOSIS — Z794 Long term (current) use of insulin: Secondary | ICD-10-CM | POA: Insufficient documentation

## 2019-11-27 MED ORDER — HYDROCODONE-ACETAMINOPHEN 5-325 MG PO TABS: 1.0000 | ORAL_TABLET | Freq: Four times a day (QID) | ORAL | 0 refills | Status: DC | PRN

## 2019-11-27 NOTE — Discharge Instructions (Addendum)
  Continue antibiotic 2 times a day  use warm compresses if they help with pain Change dressings when they become soaked You will need a dressing for the next couple days until the drainage stops Take pain medicine as needed Do not drive on pain medicine See your doctor next week

## 2019-11-27 NOTE — ED Provider Notes (Signed)
[facility]    CSN: [id number] Arrival date & time: 11/27/19  1724      History   Chief Complaint Chief Complaint  Patient presents with  . Abscess    HPI Donna Snow is a 69 y.o. female.   HPI  Patient is an insulin-dependent diabetic She states that she gives her self injections in the abdomen One of her injections was painful. A couple days later turned red. Now she has a large red painful area on her abdomen She went to her primary care doctor and was given sulfamethoxazole She has been using warm compresses Now she thinks the area needs to be drained Her sugars have been running a little bit higher than usual She has not had any fever She does feel a little malaise  Normally diabetes is well controlled. Hemoglobin A1c stays under 6  Past Medical History:  Diagnosis Date  . Diabetes mellitus   . Hypercholesteremia   . Hypertension   . Obesity     There are no problems to display for this patient.   Past Surgical History:  Procedure Laterality Date  . ABDOMINAL HYSTERECTOMY    . ABDOMINAL SURGERY    . CHOLECYSTECTOMY    . JOINT REPLACEMENT      OB History   No obstetric history on file.      Home Medications    Prior to Admission medications   Medication Sig Start Date End Date Taking? Authorizing Provider  cyclobenzaprine (FLEXERIL) 10 MG tablet Take 1 tablet (10 mg total) by mouth 2 (two) times daily as needed for muscle spasms. 03/07/15  Yes Linwood Dibbles, MD  insulin regular human CONCENTRATED (HUMULIN R) 500 UNIT/ML injection Inject into the skin.   Yes [provider]  lisinopril-hydrochlorothiazide (PRINZIDE,ZESTORETIC) 10-12.5 MG per tablet Take 1 tablet by mouth daily.     Yes [provider]  LORazepam (ATIVAN) 2 MG tablet Take 2 mg by mouth 2 (two) times daily.     Yes [provider]  phenytoin (DILANTIN) 100 MG ER capsule Take 400 mg by mouth 2 (two) times daily.     Yes [provider]    sulfamethoxazole-trimethoprim (BACTRIM DS) 800-160 MG tablet Take 1 tablet by mouth 2 (two) times daily.   Yes [provider]  acyclovir (ZOVIRAX) 400 MG tablet Take 1 tablet (400 mg total) by mouth 5 (five) times daily. 04/11/14   Renne Crigler, PA-C  atorvastatin (LIPITOR) 40 MG tablet Take 40 mg by mouth daily.      [provider]  DULoxetine (CYMBALTA) 60 MG capsule Take 60 mg by mouth daily.      [provider]  HYDROcodone-acetaminophen (NORCO/VICODIN) 5-325 MG tablet Take 1-2 tablets by mouth every 6 (six) hours as needed. 11/27/19   Eustace Moore, MD  nystatin cream (MYCOSTATIN) Apply to affected area 2 times daily 11/21/14   Piepenbrink, Victorino Dike, PA-C  fexofenadine (ALLEGRA) 180 MG tablet Take 180 mg by mouth daily.    11/27/19  [provider]  glipiZIDE (GLUCOTROL) 10 MG tablet Take 10 mg by mouth 2 (two) times daily before a meal.    11/27/19  [provider]  sitaGLIPtan-metformin (JANUMET) 50-1000 MG per tablet Take 1 tablet by mouth 2 (two) times daily with a meal.    11/27/19  [provider]  zolpidem (AMBIEN) 10 MG tablet Take 10 mg by mouth at bedtime as needed.    11/27/19  [provider]    Family History  Family History  Adopted: Yes    Social History Social History   Tobacco Use  . Smoking status: Never Smoker  . Smokeless tobacco: Never Used  Substance Use Topics  . Alcohol use: No  . Drug use: No     Allergies   Penicillins   Review of Systems Review of Systems See HPI  Physical Exam Triage Vital Signs ED Triage Vitals  Enc Vitals Group     BP 11/27/19 1914 140/73     Pulse Rate 11/27/19 1914 89     Resp 11/27/19 1914 (!) 24-anxiety     Temp 11/27/19 1914 99.6 F (37.6 C)     Temp Source 11/27/19 1914 Oral     SpO2 11/27/19 1914 96 %     Weight --      Height --      Head Circumference --      Peak Flow --      Pain Score 11/27/19 1908 10     Pain Loc --      Pain Edu? --       Excl. in GC? --    No data found.  Updated Vital Signs BP 140/73 (BP Location: Right Arm) Comment (BP Location): regular cuff, forearm  Pulse 89   Temp 99.6 F (37.6 C) (Oral)   Resp (!) 24   SpO2 96%      Physical Exam Constitutional:      General: She is not in acute distress.    Appearance: She is well-developed.     Comments: Anxious. Obese. Pleasant  HENT:     Head: Normocephalic and atraumatic.     Mouth/Throat:     Comments: Mask is in place Eyes:     Conjunctiva/sclera: Conjunctivae normal.     Pupils: Pupils are equal, round, and reactive to light.  Cardiovascular:     Rate and Rhythm: Normal rate.  Pulmonary:     Effort: Pulmonary effort is normal. No respiratory distress.  Abdominal:     General: There is no distension.     Palpations: Abdomen is soft.    Musculoskeletal:        General: Normal range of motion.     Cervical back: Normal range of motion.  Skin:    General: Skin is warm and dry.  Neurological:     Mental Status: She is alert.      UC Treatments / Results  Labs (all labs ordered are listed, but only abnormal results are displayed) Labs Reviewed  AEROBIC/ANAEROBIC CULTURE (SURGICAL/DEEP WOUND)    EKG   Radiology No results found.  Procedures Incision and Drainage  Date/Time: 11/27/2019 8:35 PM Performed by: Eustace Moore, MD Authorized by: Eustace Moore, MD   Consent:    Consent obtained:  Verbal   Consent given by:  Patient   Risks discussed:  Incomplete drainage Location:    Type:  Abscess   Size:  10 cm   Location:  Trunk   Trunk location:  Abdomen Pre-procedure details:    Skin preparation:  Betadine Anesthesia (see MAR for exact dosages):    Anesthesia method:  Local infiltration   Local anesthetic:  Lidocaine 2% WITH epi Procedure type:    Complexity:  Simple Procedure details:    Incision types:  Stab incision   Incision depth:  Subcutaneous   Scalpel blade:  11   Wound management:   Probed and deloculated   Drainage:  Purulent   Drainage amount:  Copious   Wound  treatment:  Drain placed   Packing materials:  1/2 in iodoform gauze   Amount 1/2" iodoform:  2 inch Post-procedure details:    Patient tolerance of procedure:  Tolerated well, no immediate complications Comments:     Copious purulent drainage   (including critical care time)  Medications Ordered in UC Medications - No data to display  Initial Impression / Assessment and Plan / UC Course  I have reviewed the triage vital signs and the nursing notes.  Pertinent labs & imaging results that were available during my care of the patient were reviewed by me and considered in my medical decision making (see chart for details).     Dressing changes and aftercare discussed Final Clinical Impressions(s) / UC Diagnoses   Final diagnoses:  Abscess  Type 2 diabetes mellitus without complication, with long-term current use of insulin (HCC)     Discharge Instructions      Continue antibiotic 2 times a day  use warm compresses if they help with pain Change dressings when they become soaked You will need a dressing for the next couple days until the drainage stops Take pain medicine as needed Do not drive on pain medicine See your doctor next week       ED Prescriptions    Medication Sig Dispense Auth. Provider   HYDROcodone-acetaminophen (NORCO/VICODIN) 5-325 MG tablet Take 1-2 tablets by mouth every 6 (six) hours as needed. 15 tablet Eustace Moore, MD     I have reviewed the PDMP during this encounter.   Eustace Moore, MD 11/27/19 2037

## 2019-11-27 NOTE — ED Triage Notes (Signed)
[facility] gave patient antibiotics and told her to use warm cloths.  Patient thinks this needs lancing, therefore she came here.  Patient has a red knot on left abdomen.  Reports this area is throbbing.  Patient reports having anxiety and has begged for granddaughter to be in treatment room

## 2019-11-29 LAB — AEROBIC CULTURE W GRAM STAIN (SUPERFICIAL SPECIMEN)

## 2020-04-10 ENCOUNTER — Other Ambulatory Visit: Payer: Self-pay

## 2020-04-10 ENCOUNTER — Emergency Department (HOSPITAL_COMMUNITY): Payer: Medicare HMO

## 2020-04-10 ENCOUNTER — Emergency Department (HOSPITAL_COMMUNITY)
Admission: EM | Admit: 2020-04-10 | Discharge: 2020-04-10 | Disposition: A | Discharge status: Home / Self Care | Payer: Medicare HMO | Attending: Emergency Medicine | Admitting: Emergency Medicine

## 2020-04-10 DIAGNOSIS — I10 Essential (primary) hypertension: Secondary | ICD-10-CM | POA: Diagnosis not present

## 2020-04-10 DIAGNOSIS — Z9660 Presence of unspecified orthopedic joint implant: Secondary | ICD-10-CM | POA: Insufficient documentation

## 2020-04-10 DIAGNOSIS — Y92410 Unspecified street and highway as the place of occurrence of the external cause: Secondary | ICD-10-CM | POA: Diagnosis not present

## 2020-04-10 DIAGNOSIS — Z794 Long term (current) use of insulin: Secondary | ICD-10-CM | POA: Insufficient documentation

## 2020-04-10 DIAGNOSIS — E1169 Type 2 diabetes mellitus with other specified complication: Secondary | ICD-10-CM | POA: Diagnosis not present

## 2020-04-10 DIAGNOSIS — E7800 Pure hypercholesterolemia, unspecified: Secondary | ICD-10-CM | POA: Insufficient documentation

## 2020-04-10 DIAGNOSIS — S39012A Strain of muscle, fascia and tendon of lower back, initial encounter: Secondary | ICD-10-CM | POA: Insufficient documentation

## 2020-04-10 DIAGNOSIS — R079 Chest pain, unspecified: Secondary | ICD-10-CM | POA: Insufficient documentation

## 2020-04-10 DIAGNOSIS — Z79899 Other long term (current) drug therapy: Secondary | ICD-10-CM | POA: Insufficient documentation

## 2020-04-10 DIAGNOSIS — M7918 Myalgia, other site: Secondary | ICD-10-CM

## 2020-04-10 DIAGNOSIS — Z7984 Long term (current) use of oral hypoglycemic drugs: Secondary | ICD-10-CM | POA: Diagnosis not present

## 2020-04-10 DIAGNOSIS — S3992XA Unspecified injury of lower back, initial encounter: Secondary | ICD-10-CM | POA: Diagnosis present

## 2020-04-10 MED ORDER — HYDROCODONE-ACETAMINOPHEN 5-325 MG PO TABS: 1.0000 | ORAL_TABLET | Freq: Four times a day (QID) | ORAL | 0 refills | Status: DC | PRN

## 2020-04-10 MED ORDER — HYDROCODONE-ACETAMINOPHEN 5-325 MG PO TABS
1.0000 | ORAL_TABLET | Freq: Once | ORAL | Status: AC
  Administered 2020-04-10: 1 via ORAL
  Filled 2020-04-10: qty 1
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
## 2020-04-10 NOTE — Discharge Instructions (Signed)
Follow-up with your pain doctor.  Watch for worsening numbness or weakness.
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
## 2020-10-15 ENCOUNTER — Other Ambulatory Visit: Payer: Self-pay

## 2020-10-15 ENCOUNTER — Inpatient Hospital Stay (HOSPITAL_COMMUNITY)
Admission: EM | Admit: 2020-10-15 | Discharge: 2020-10-22 | DRG: 871 | Disposition: A | Discharge status: Home Health | Payer: Medicare HMO | Attending: Family Medicine | Admitting: Family Medicine

## 2020-10-15 ENCOUNTER — Emergency Department (HOSPITAL_COMMUNITY): Payer: Medicare HMO

## 2020-10-15 DIAGNOSIS — E1149 Type 2 diabetes mellitus with other diabetic neurological complication: Secondary | ICD-10-CM | POA: Diagnosis present

## 2020-10-15 DIAGNOSIS — Z9660 Presence of unspecified orthopedic joint implant: Secondary | ICD-10-CM | POA: Diagnosis present

## 2020-10-15 DIAGNOSIS — L03311 Cellulitis of abdominal wall: Secondary | ICD-10-CM | POA: Diagnosis present

## 2020-10-15 DIAGNOSIS — Z20822 Contact with and (suspected) exposure to covid-19: Secondary | ICD-10-CM | POA: Diagnosis present

## 2020-10-15 DIAGNOSIS — Z9049 Acquired absence of other specified parts of digestive tract: Secondary | ICD-10-CM

## 2020-10-15 DIAGNOSIS — Z91040 Latex allergy status: Secondary | ICD-10-CM

## 2020-10-15 DIAGNOSIS — D72829 Elevated white blood cell count, unspecified: Secondary | ICD-10-CM | POA: Diagnosis not present

## 2020-10-15 DIAGNOSIS — E7800 Pure hypercholesterolemia, unspecified: Secondary | ICD-10-CM | POA: Diagnosis present

## 2020-10-15 DIAGNOSIS — G40909 Epilepsy, unspecified, not intractable, without status epilepticus: Secondary | ICD-10-CM | POA: Diagnosis present

## 2020-10-15 DIAGNOSIS — Z880 Allergy status to penicillin: Secondary | ICD-10-CM

## 2020-10-15 DIAGNOSIS — R6510 Systemic inflammatory response syndrome (SIRS) of non-infectious origin without acute organ dysfunction: Secondary | ICD-10-CM

## 2020-10-15 DIAGNOSIS — F32A Depression, unspecified: Secondary | ICD-10-CM | POA: Diagnosis present

## 2020-10-15 DIAGNOSIS — R7989 Other specified abnormal findings of blood chemistry: Secondary | ICD-10-CM | POA: Diagnosis not present

## 2020-10-15 DIAGNOSIS — I5031 Acute diastolic (congestive) heart failure: Secondary | ICD-10-CM

## 2020-10-15 DIAGNOSIS — E441 Mild protein-calorie malnutrition: Secondary | ICD-10-CM | POA: Diagnosis present

## 2020-10-15 DIAGNOSIS — Z794 Long term (current) use of insulin: Secondary | ICD-10-CM | POA: Diagnosis not present

## 2020-10-15 DIAGNOSIS — I110 Hypertensive heart disease with heart failure: Secondary | ICD-10-CM | POA: Diagnosis present

## 2020-10-15 DIAGNOSIS — J449 Chronic obstructive pulmonary disease, unspecified: Secondary | ICD-10-CM | POA: Diagnosis present

## 2020-10-15 DIAGNOSIS — I10 Essential (primary) hypertension: Secondary | ICD-10-CM

## 2020-10-15 DIAGNOSIS — Z7951 Long term (current) use of inhaled steroids: Secondary | ICD-10-CM | POA: Diagnosis not present

## 2020-10-15 DIAGNOSIS — I5033 Acute on chronic diastolic (congestive) heart failure: Secondary | ICD-10-CM | POA: Diagnosis present

## 2020-10-15 DIAGNOSIS — M549 Dorsalgia, unspecified: Secondary | ICD-10-CM

## 2020-10-15 DIAGNOSIS — R7401 Elevation of levels of liver transaminase levels: Secondary | ICD-10-CM

## 2020-10-15 DIAGNOSIS — E785 Hyperlipidemia, unspecified: Secondary | ICD-10-CM | POA: Diagnosis present

## 2020-10-15 DIAGNOSIS — J9601 Acute respiratory failure with hypoxia: Secondary | ICD-10-CM | POA: Diagnosis not present

## 2020-10-15 DIAGNOSIS — F419 Anxiety disorder, unspecified: Secondary | ICD-10-CM | POA: Diagnosis present

## 2020-10-15 DIAGNOSIS — E119 Type 2 diabetes mellitus without complications: Secondary | ICD-10-CM

## 2020-10-15 DIAGNOSIS — E46 Unspecified protein-calorie malnutrition: Secondary | ICD-10-CM

## 2020-10-15 DIAGNOSIS — I480 Paroxysmal atrial fibrillation: Secondary | ICD-10-CM | POA: Diagnosis not present

## 2020-10-15 DIAGNOSIS — K219 Gastro-esophageal reflux disease without esophagitis: Secondary | ICD-10-CM | POA: Diagnosis present

## 2020-10-15 DIAGNOSIS — E860 Dehydration: Secondary | ICD-10-CM | POA: Diagnosis present

## 2020-10-15 DIAGNOSIS — E1165 Type 2 diabetes mellitus with hyperglycemia: Secondary | ICD-10-CM | POA: Diagnosis present

## 2020-10-15 DIAGNOSIS — L02211 Cutaneous abscess of abdominal wall: Secondary | ICD-10-CM

## 2020-10-15 DIAGNOSIS — Z7984 Long term (current) use of oral hypoglycemic drugs: Secondary | ICD-10-CM

## 2020-10-15 DIAGNOSIS — E782 Mixed hyperlipidemia: Secondary | ICD-10-CM

## 2020-10-15 DIAGNOSIS — R739 Hyperglycemia, unspecified: Secondary | ICD-10-CM

## 2020-10-15 DIAGNOSIS — G8929 Other chronic pain: Secondary | ICD-10-CM

## 2020-10-15 DIAGNOSIS — Z79899 Other long term (current) drug therapy: Secondary | ICD-10-CM | POA: Diagnosis not present

## 2020-10-15 DIAGNOSIS — A419 Sepsis, unspecified organism: Secondary | ICD-10-CM | POA: Diagnosis present

## 2020-10-15 DIAGNOSIS — R55 Syncope and collapse: Secondary | ICD-10-CM

## 2020-10-15 DIAGNOSIS — E871 Hypo-osmolality and hyponatremia: Secondary | ICD-10-CM | POA: Diagnosis present

## 2020-10-15 DIAGNOSIS — I350 Nonrheumatic aortic (valve) stenosis: Secondary | ICD-10-CM | POA: Diagnosis not present

## 2020-10-15 DIAGNOSIS — M79671 Pain in right foot: Secondary | ICD-10-CM

## 2020-10-15 DIAGNOSIS — E8809 Other disorders of plasma-protein metabolism, not elsewhere classified: Secondary | ICD-10-CM | POA: Diagnosis not present

## 2020-10-15 HISTORY — DX: Cellulitis of abdominal wall: L03.311

## 2020-10-15 HISTORY — DX: Cutaneous abscess of abdominal wall: L02.211

## 2020-10-15 LAB — CBC
HCT: 40.4 % (ref 36.0–46.0)
Hemoglobin: 12.9 g/dL (ref 12.0–15.0)
MCH: 29.1 pg (ref 26.0–34.0)
MCHC: 31.9 g/dL (ref 30.0–36.0)
MCV: 91 fL (ref 80.0–100.0)
Platelets: 245 10*3/uL (ref 150–400)
RBC: 4.44 MIL/uL (ref 3.87–5.11)
RDW: 13.2 % (ref 11.5–15.5)
WBC: 10.5 10*3/uL (ref 4.0–10.5)
nRBC: 0 % (ref 0.0–0.2)

## 2020-10-15 LAB — COMPREHENSIVE METABOLIC PANEL
ALT: 20 U/L (ref 0–44)
AST: 12 U/L — ABNORMAL LOW (ref 15–41)
Albumin: 3.3 g/dL — ABNORMAL LOW (ref 3.5–5.0)
Alkaline Phosphatase: 147 U/L — ABNORMAL HIGH (ref 38–126)
Anion gap: 8 (ref 5–15)
BUN: 19 mg/dL (ref 8–23)
CO2: 24 mmol/L (ref 22–32)
Calcium: 8.5 mg/dL — ABNORMAL LOW (ref 8.9–10.3)
Chloride: 100 mmol/L (ref 98–111)
Creatinine, Ser: 0.92 mg/dL (ref 0.44–1.00)
GFR, Estimated: >60 mL/min (ref >60)
Glucose, Bld: 375 mg/dL — ABNORMAL HIGH (ref 70–99)
Potassium: 4.4 mmol/L (ref 3.5–5.1)
Sodium: 132 mmol/L — ABNORMAL LOW (ref 135–145)
Total Bilirubin: 1 mg/dL (ref 0.3–1.2)
Total Protein: 7.5 g/dL (ref 6.5–8.1)

## 2020-10-15 LAB — URINALYSIS, ROUTINE W REFLEX MICROSCOPIC
Bilirubin Urine: NEGATIVE
Glucose, UA: >=500 mg/dL — AB
Hgb urine dipstick: NEGATIVE
Ketones, ur: 20 mg/dL — AB
Leukocytes,Ua: NEGATIVE
Nitrite: NEGATIVE
Protein, ur: NEGATIVE mg/dL
Specific Gravity, Urine: 1.016 (ref 1.005–1.030)
pH: 7 (ref 5.0–8.0)

## 2020-10-15 LAB — CBC WITH DIFFERENTIAL/PLATELET
Abs Immature Granulocytes: 0.05 10*3/uL (ref 0.00–0.07)
Basophils Absolute: 0 10*3/uL (ref 0.0–0.1)
Basophils Relative: 0 %
Eosinophils Absolute: 0 10*3/uL (ref 0.0–0.5)
Eosinophils Relative: 0 %
HCT: 40.5 % (ref 36.0–46.0)
Hemoglobin: 13.1 g/dL (ref 12.0–15.0)
Immature Granulocytes: 0 %
Lymphocytes Relative: 14 %
Lymphs Abs: 1.7 10*3/uL (ref 0.7–4.0)
MCH: 29.1 pg (ref 26.0–34.0)
MCHC: 32.3 g/dL (ref 30.0–36.0)
MCV: 90 fL (ref 80.0–100.0)
Monocytes Absolute: 1 10*3/uL (ref 0.1–1.0)
Monocytes Relative: 8 %
Neutro Abs: 9.8 10*3/uL — ABNORMAL HIGH (ref 1.7–7.7)
Neutrophils Relative %: 78 %
Platelets: 248 10*3/uL (ref 150–400)
RBC: 4.5 MIL/uL (ref 3.87–5.11)
RDW: 13.1 % (ref 11.5–15.5)
WBC: 12.7 10*3/uL — ABNORMAL HIGH (ref 4.0–10.5)
nRBC: 0 % (ref 0.0–0.2)

## 2020-10-15 LAB — LACTIC ACID, PLASMA
Lactic Acid, Venous: 1.3 mmol/L (ref 0.5–1.9)
Lactic Acid, Venous: 1.6 mmol/L (ref 0.5–1.9)

## 2020-10-15 LAB — PROTIME-INR
INR: 1.1 (ref 0.8–1.2)
Prothrombin Time: 14.3 seconds (ref 11.4–15.2)

## 2020-10-15 LAB — CREATININE, SERUM
Creatinine, Ser: 0.73 mg/dL (ref 0.44–1.00)
GFR, Estimated: >60 mL/min (ref >60)

## 2020-10-15 LAB — APTT: aPTT: 29 seconds (ref 24–36)

## 2020-10-15 LAB — CBG MONITORING, ED: Glucose-Capillary: 303 mg/dL — ABNORMAL HIGH (ref 70–99)

## 2020-10-15 LAB — GLUCOSE, CAPILLARY: Glucose-Capillary: 290 mg/dL — ABNORMAL HIGH (ref 70–99)

## 2020-10-15 LAB — RESP PANEL BY RT-PCR (FLU A&B, COVID) ARPGX2
Influenza A by PCR: NEGATIVE
Influenza B by PCR: NEGATIVE
SARS Coronavirus 2 by RT PCR: NEGATIVE

## 2020-10-15 MED ORDER — LACTATED RINGERS IV SOLN: INTRAVENOUS | Status: AC

## 2020-10-15 MED ORDER — ACETAMINOPHEN 325 MG PO TABS
650.0000 mg | ORAL_TABLET | Freq: Four times a day (QID) | ORAL | Status: DC | PRN
  Administered 2020-10-15 – 2020-10-21 (×6): 650 mg via ORAL
  Filled 2020-10-15 (×6): qty 2

## 2020-10-15 MED ORDER — SODIUM CHLORIDE 0.9 % IV SOLN
1.0000 g | Freq: Three times a day (TID) | INTRAVENOUS | Status: DC
  Administered 2020-10-16 (×2): 1 g via INTRAVENOUS
  Filled 2020-10-15 (×9): qty 1

## 2020-10-15 MED ORDER — ACETAMINOPHEN 500 MG PO TABS
1000.0000 mg | ORAL_TABLET | Freq: Once | ORAL | Status: AC
  Administered 2020-10-15: 1000 mg via ORAL
  Filled 2020-10-15: qty 2

## 2020-10-15 MED ORDER — LACTATED RINGERS IV BOLUS (SEPSIS)
1000.0000 mL | Freq: Once | INTRAVENOUS | Status: AC
  Administered 2020-10-15: 1000 mL via INTRAVENOUS

## 2020-10-15 MED ORDER — SODIUM CHLORIDE 0.9 % IV BOLUS
1000.0000 mL | Freq: Once | INTRAVENOUS | Status: AC
  Administered 2020-10-15: 1000 mL via INTRAVENOUS

## 2020-10-15 MED ORDER — ENOXAPARIN SODIUM 40 MG/0.4ML IJ SOSY
40.0000 mg | PREFILLED_SYRINGE | INTRAMUSCULAR | Status: DC
  Administered 2020-10-15 – 2020-10-16 (×2): 40 mg via SUBCUTANEOUS
  Filled 2020-10-15 (×2): qty 0.4

## 2020-10-15 MED ORDER — POVIDONE-IODINE 10 % EX SOLN
CUTANEOUS | Status: DC | PRN
  Filled 2020-10-15: qty 15

## 2020-10-15 MED ORDER — PANTOPRAZOLE SODIUM 40 MG PO TBEC
40.0000 mg | DELAYED_RELEASE_TABLET | Freq: Every day | ORAL | Status: DC
  Administered 2020-10-16 – 2020-10-22 (×7): 40 mg via ORAL
  Filled 2020-10-15 (×7): qty 1

## 2020-10-15 MED ORDER — GLUCERNA SHAKE PO LIQD
237.0000 mL | Freq: Three times a day (TID) | ORAL | Status: DC
  Administered 2020-10-16 – 2020-10-22 (×13): 237 mL via ORAL

## 2020-10-15 MED ORDER — VANCOMYCIN HCL 2000 MG/400ML IV SOLN
2000.0000 mg | Freq: Once | INTRAVENOUS | Status: AC
  Administered 2020-10-15: 2000 mg via INTRAVENOUS
  Filled 2020-10-15: qty 400

## 2020-10-15 MED ORDER — SODIUM CHLORIDE 0.9 % IV SOLN
2.0000 g | Freq: Once | INTRAVENOUS | Status: AC
  Administered 2020-10-15: 2 g via INTRAVENOUS
  Filled 2020-10-15: qty 2

## 2020-10-15 MED ORDER — TRAZODONE HCL 50 MG PO TABS
50.0000 mg | ORAL_TABLET | Freq: Every day | ORAL | Status: DC
  Administered 2020-10-15 – 2020-10-21 (×7): 50 mg via ORAL
  Filled 2020-10-15 (×7): qty 1

## 2020-10-15 MED ORDER — MORPHINE SULFATE (PF) 4 MG/ML IV SOLN
4.0000 mg | Freq: Once | INTRAVENOUS | Status: AC
  Administered 2020-10-15: 4 mg via INTRAVENOUS
  Filled 2020-10-15: qty 1

## 2020-10-15 MED ORDER — VANCOMYCIN HCL 1250 MG/250ML IV SOLN
1250.0000 mg | Freq: Two times a day (BID) | INTRAVENOUS | Status: DC
  Administered 2020-10-16 – 2020-10-21 (×11): 1250 mg via INTRAVENOUS
  Filled 2020-10-15 (×11): qty 250

## 2020-10-15 MED ORDER — LIDOCAINE HCL (PF) 1 % IJ SOLN
30.0000 mL | Freq: Once | INTRAMUSCULAR | Status: AC
  Administered 2020-10-15: 30 mL
  Filled 2020-10-15: qty 30

## 2020-10-15 MED ORDER — NYSTATIN 100000 UNIT/GM EX POWD
Freq: Two times a day (BID) | CUTANEOUS | Status: DC
  Filled 2020-10-15 (×3): qty 15

## 2020-10-15 MED ORDER — ONDANSETRON HCL 4 MG/2ML IJ SOLN
4.0000 mg | Freq: Once | INTRAMUSCULAR | Status: AC
  Administered 2020-10-15: 4 mg via INTRAVENOUS
  Filled 2020-10-15: qty 2

## 2020-10-15 MED ORDER — LOSARTAN POTASSIUM 50 MG PO TABS
50.0000 mg | ORAL_TABLET | Freq: Every day | ORAL | Status: DC
  Administered 2020-10-16 – 2020-10-17 (×2): 50 mg via ORAL
  Filled 2020-10-15 (×2): qty 1

## 2020-10-15 MED ORDER — INSULIN ASPART 100 UNIT/ML IJ SOLN
0.0000 [IU] | Freq: Three times a day (TID) | INTRAMUSCULAR | Status: DC
  Administered 2020-10-16 (×2): 7 [IU] via SUBCUTANEOUS
  Administered 2020-10-16 – 2020-10-17 (×2): 11 [IU] via SUBCUTANEOUS
  Administered 2020-10-17: 15 [IU] via SUBCUTANEOUS
  Administered 2020-10-17: 11 [IU] via SUBCUTANEOUS
  Administered 2020-10-18: 4 [IU] via SUBCUTANEOUS
  Administered 2020-10-18 (×2): 7 [IU] via SUBCUTANEOUS
  Administered 2020-10-19 (×2): 15 [IU] via SUBCUTANEOUS
  Administered 2020-10-19: 3 [IU] via SUBCUTANEOUS
  Administered 2020-10-20: 4 [IU] via SUBCUTANEOUS
  Administered 2020-10-20: 7 [IU] via SUBCUTANEOUS
  Administered 2020-10-20: 4 [IU] via SUBCUTANEOUS
  Administered 2020-10-21: 7 [IU] via SUBCUTANEOUS
  Administered 2020-10-21: 4 [IU] via SUBCUTANEOUS
  Administered 2020-10-21: 7 [IU] via SUBCUTANEOUS
  Administered 2020-10-22 (×2): 4 [IU] via SUBCUTANEOUS

## 2020-10-15 MED ORDER — INSULIN ASPART 100 UNIT/ML IJ SOLN
0.0000 [IU] | Freq: Every day | INTRAMUSCULAR | Status: DC
Start: 2020-10-15 — End: 2020-10-22
  Administered 2020-10-15 – 2020-10-16 (×2): 3 [IU] via SUBCUTANEOUS
  Administered 2020-10-17: 2 [IU] via SUBCUTANEOUS
  Administered 2020-10-18: 3 [IU] via SUBCUTANEOUS
  Administered 2020-10-21: 2 [IU] via SUBCUTANEOUS

## 2020-10-15 MED ORDER — ONDANSETRON HCL 4 MG/2ML IJ SOLN: 4.0000 mg | Freq: Four times a day (QID) | INTRAMUSCULAR | Status: DC | PRN

## 2020-10-15 MED ORDER — CYCLOBENZAPRINE HCL 10 MG PO TABS
10.0000 mg | ORAL_TABLET | Freq: Two times a day (BID) | ORAL | Status: DC | PRN
  Administered 2020-10-16 – 2020-10-20 (×4): 10 mg via ORAL
  Filled 2020-10-15 (×4): qty 1

## 2020-10-15 NOTE — Progress Notes (Signed)
Pharmacy Antibiotic Note  Donna Snow is a 70 y.o. female admitted on 10/15/2020 with cellulitis.  Pharmacy has been consulted for Vancomycin and Aztreonam dosing.  Pt is morbidly obese.  Plan: Vancomycin 1250mg  IV q12hrs (2gm dose given in ED) Aztreonam 1gm IV q8hrs (2gm dose given in ED)  Last recorded weight for pt was 149Kg, SCr 0.92 Height: 5\' 5"  (165.1 cm) IBW/kg (Calculated) : 57  Temp (24hrs), Avg:101.3 F (38.5 C), Min:101.3 F (38.5 C), Max:101.3 F (38.5 C)  Recent Labs  Lab 10/15/20 1557  WBC 12.7*  CREATININE 0.92  LATICACIDVEN 1.3    CrCl cannot be calculated (Unknown ideal weight.).    Allergies  Allergen Reactions  . Penicillins     Antimicrobials this admission: Aztreonam 6/3 >>  Vancomycin 6/3 >>   Dose adjustments this admission:   Microbiology results:  BCx: follow up   UCx: follow up   Sputum: follow up   MRSA PCR: follow up  Thank you for allowing pharmacy to be a part of this patient's care.  8/3 A 10/15/2020 5:25 PM

## 2020-10-15 NOTE — ED Provider Notes (Signed)
[facility] EMERGENCY DEPARTMENT Provider Note   CSN: [id number] Arrival date & time: 10/15/20  1415     History Chief Complaint  Patient presents with  . Abscess    Right upper abd abscess     Donna Snow is a 70 y.o. 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  She has been applying warm compresses and trying to express what she suspected was a small abscess but the infection continued to get deeper and now has significant surrounding erythema.  She also endorses nausea without emesis but has had some episodes of dry heaves, she presents with a fever of 101.3 and endorses a significant headache which has been present for the past 2 days.  She has found no alleviators for her symptoms prior to arrival.  She has been admitted to the hospital for prior infections similar to today's presentation.  HPI     Past Medical History:  Diagnosis Date  . Diabetes mellitus   . Hypercholesteremia   . Hypertension   . Obesity     There are no problems to display for this patient.   Past Surgical History:  Procedure Laterality Date  . ABDOMINAL HYSTERECTOMY    . ABDOMINAL SURGERY    . CHOLECYSTECTOMY    . JOINT REPLACEMENT       OB History   No obstetric history on file.     Family History  Adopted: Yes    Social History   Tobacco Use  . Smoking status: Never Smoker  . Smokeless tobacco: Never Used  Substance Use Topics  . Alcohol use: No  . Drug use: No    Home Medications Prior to Admission medications   Medication Sig Start Date End Date Taking? Authorizing Provider  albuterol (VENTOLIN HFA) 108 (90 Base) MCG/ACT inhaler Inhale 2 puffs into the lungs every 4 (four) hours as needed for shortness of breath. 06/06/20  Yes [provider]   atorvastatin (LIPITOR) 40 MG tablet Take 40 mg by mouth daily.   Yes [provider]  budesonide-formoterol (SYMBICORT) 80-4.5 MCG/ACT inhaler Inhale 2 puffs into the lungs 2 (two) times daily. 11/22/18  Yes [provider]  busPIRone (BUSPAR) 10 MG tablet Take 20 mg by mouth 3 (three) times daily. 08/16/20  Yes [provider]  cetirizine (ZYRTEC) 10 MG tablet Take 10 mg by mouth daily.   Yes [provider]  cyclobenzaprine (FLEXERIL) 10 MG tablet Take 1 tablet (10 mg total) by mouth 2 (two) times daily as needed for muscle spasms. 03/07/15  Yes Linwood Dibbles, MD  DULoxetine (CYMBALTA) 60 MG capsule Take 60 mg by mouth 2 (two) times daily.   Yes [provider]  ezetimibe (ZETIA) 10 MG tablet Take 1 tablet by mouth daily. 07/25/18  Yes [provider]  fluticasone furoate-vilanterol (BREO ELLIPTA) 100-25 MCG/INH AEPB Inhale 1 puff into the lungs daily. 03/08/20  Yes [provider]  furosemide (LASIX) 40 MG tablet Take 40 mg by mouth daily. 09/27/20  Yes [provider]  Hyoscyamine Sulfate SL 0.125 MG SUBL Place 1 tablet under the tongue 3 (three) times daily. 08/23/18  Yes [provider]  IBU 800 MG tablet Take 800 mg by mouth every 8 (eight) hours. 06/06/20  Yes [provider]  insulin regular human CONCENTRATED (HUMULIN R) 500  UNIT/ML injection Inject 12-15 Units into the skin 3 (three) times daily with meals. Sliding scale   Yes [provider]  LORazepam (ATIVAN) 2 MG tablet Take 2 mg by mouth 2 (two) times daily.   Yes [provider]  losartan (COZAAR) 50 MG tablet Take 1 tablet by mouth daily. 09/21/20  Yes [provider]  omeprazole (PRILOSEC) 40 MG capsule Take 1 capsule by mouth daily. 06/05/20  Yes [provider]  phenytoin (DILANTIN) 100 MG ER capsule Take 400 mg by mouth daily.   Yes [provider]  rosuvastatin (CRESTOR) 40 MG tablet Take 1 tablet by  mouth daily. 12/22/18  Yes [provider]  tiZANidine (ZANAFLEX) 4 MG tablet Take 4 mg by mouth 3 (three) times daily. 09/01/20  Yes [provider]  topiramate (TOPAMAX) 100 MG tablet Take 200 mg by mouth 2 (two) times daily. 06/06/20  Yes [provider]  traZODone (DESYREL) 50 MG tablet Take 50 mg by mouth at bedtime. 08/17/20  Yes [provider]  acyclovir (ZOVIRAX) 400 MG tablet Take 1 tablet (400 mg total) by mouth 5 (five) times daily. Patient not taking: No sig reported 04/11/14   Renne Crigler, PA-C  citalopram (CELEXA) 20 MG tablet Take by mouth. Patient not taking: Reported on 10/15/2020    [provider]  dicyclomine (BENTYL) 10 MG capsule Take by mouth. Patient not taking: Reported on 10/15/2020    [provider]  HYDROcodone-acetaminophen (NORCO/VICODIN) 5-325 MG tablet Take 1-2 tablets by mouth every 6 (six) hours as needed. Patient not taking: No sig reported 04/10/20   Benjiman Core, MD  lisinopril-hydrochlorothiazide (PRINZIDE,ZESTORETIC) 10-12.5 MG per tablet Take 1 tablet by mouth daily.   Patient not taking: No sig reported    [provider]  nystatin cream (MYCOSTATIN) Apply to affected area 2 times daily Patient not taking: Reported on 10/15/2020 11/21/14   Piepenbrink, Victorino Dike, PA-C  sulfamethoxazole-trimethoprim (BACTRIM DS) 800-160 MG tablet Take 1 tablet by mouth 2 (two) times daily. Patient not taking: Reported on 10/15/2020    [provider]  traMADol (ULTRAM) 50 MG tablet Take by mouth. Patient not taking: Reported on 10/15/2020    [provider]  fexofenadine (ALLEGRA) 180 MG tablet Take 180 mg by mouth daily.    11/27/19  [provider]  glipiZIDE (GLUCOTROL) 10 MG tablet Take 10 mg by mouth 2 (two) times daily before a meal.    11/27/19  [provider]  sitaGLIPtan-metformin (JANUMET) 50-1000 MG per tablet Take 1 tablet by mouth 2 (two) times daily with a meal.     11/27/19  [provider]  zolpidem (AMBIEN) 10 MG tablet Take 10 mg by mouth at bedtime as needed.    11/27/19  [provider]    Allergies    Latex and Penicillins  Review of Systems   Review of Systems  Constitutional: Positive for chills and fever.  HENT: Negative for congestion and sore throat.   Eyes: Negative.   Respiratory: Negative for chest tightness and shortness of breath.   Cardiovascular: Negative for chest pain.  Gastrointestinal: Positive for nausea and vomiting. Negative for abdominal pain.  Genitourinary: Negative.   Musculoskeletal: Negative for arthralgias, joint swelling and neck pain.  Skin: Positive for color change and wound. Negative for rash.  Neurological: Positive for headaches. Negative for dizziness, weakness, light-headedness and numbness.  Psychiatric/Behavioral: Negative.     Physical Exam Updated Vital Signs BP (!) 130/49 (BP Location: Left Arm)  Pulse 81   Temp 98.7 F (37.1 C) (Oral)   Resp (!) 21   Ht 5\' 5"  (1.651 m)   SpO2 94%   BMI 47.11 kg/m   Physical Exam Vitals and nursing note reviewed.  Constitutional:      Appearance: She is well-developed.     Comments: Patient is febrile with initial temperature 101.3.  She is a pulse rate of 95 with a known source of infection, meeting sepsis criteria.  HENT:     Head: Normocephalic and atraumatic.  Eyes:     Conjunctiva/sclera: Conjunctivae normal.  Cardiovascular:     Rate and Rhythm: Normal rate and regular rhythm.     Heart sounds: Normal heart sounds.  Pulmonary:     Effort: Pulmonary effort is normal.     Breath sounds: Normal breath sounds. No wheezing.  Abdominal:     General: Bowel sounds are normal.     Palpations: Abdomen is soft.     Tenderness: There is no abdominal tenderness.  Musculoskeletal:        General: Normal range of motion.     Cervical back: Normal range of motion.  Skin:    General: Skin is warm and dry.     Comments: Large area of  erythema which is tender to palpation on her right abdominal pannus.  There is a central macular purulent weeping lesion with surrounding induration.  Neurological:     Mental Status: She is alert.     ED Results / Procedures / Treatments   Labs (all labs ordered are listed, but only abnormal results are displayed) Labs Reviewed  COMPREHENSIVE METABOLIC PANEL - Abnormal; Notable for the following components:      Result Value   Sodium 132 (*)    Glucose, Bld 375 (*)    Calcium 8.5 (*)    Albumin 3.3 (*)    AST 12 (*)    Alkaline Phosphatase 147 (*)    All other components within normal limits  CBC WITH DIFFERENTIAL/PLATELET - Abnormal; Notable for the following components:   WBC 12.7 (*)    Neutro Abs 9.8 (*)    All other components within normal limits  URINALYSIS, ROUTINE W REFLEX MICROSCOPIC - Abnormal; Notable for the following components:   Glucose, UA >=500 (*)    Ketones, ur 20 (*)    Bacteria, UA RARE (*)    All other components within normal limits  RESP PANEL BY RT-PCR (FLU A&B, COVID) ARPGX2  CULTURE, BLOOD (ROUTINE X 2)  CULTURE, BLOOD (ROUTINE X 2)  URINE CULTURE  LACTIC ACID, PLASMA  LACTIC ACID, PLASMA  PROTIME-INR  APTT    EKG None  Radiology CT Head Wo Contrast  Result Date: 10/15/2020 CLINICAL DATA:  Dizziness, syncope EXAM: CT HEAD WITHOUT CONTRAST TECHNIQUE: Contiguous axial images were obtained from the base of the skull through the vertex without intravenous contrast. COMPARISON:  None. FINDINGS: Brain: No acute intracranial abnormality. Specifically, no hemorrhage, hydrocephalus, mass lesion, acute infarction, or significant intracranial injury. Vascular: No hyperdense vessel or unexpected calcification. Skull: No acute calvarial abnormality. Sinuses/Orbits: No acute findings Other: None IMPRESSION: No acute intracranial abnormality. Electronically Signed   By: 12/15/2020 M.D.   On: 10/15/2020 18:59   DG Chest Port 1 View  Result Date:  10/15/2020 CLINICAL DATA:  Shortness of breath EXAM: PORTABLE CHEST 1 VIEW COMPARISON:  April 10, 2020 FINDINGS: There is no edema or airspace opacity. Heart is mildly enlarged with pulmonary vascularity normal. No adenopathy. There is  degenerative change in each shoulder. IMPRESSION: Mild cardiac enlargement.  No edema or airspace opacity. Electronically Signed   By: Bretta BangWilliam  Woodruff III M.D.   On: 10/15/2020 16:44    Procedures Procedures   INCISION AND DRAINAGE Performed by: Burgess AmorJulie Rayleen Wyrick Consent: Verbal consent obtained. Risks and benefits: risks, benefits and alternatives were discussed Type: abscess  Body area: abdominal wall  Anesthesia: local infiltration  Incision was made with a scalpel.  Local anesthetic: lidocaine 1% without epinephrine  Anesthetic total: 5 ml  Complexity: complex Blunt dissection to break up loculations  Drainage: purulent  Drainage amount: small amount of purulence followed by blood.  Packing material: left open Patient tolerance: Patient tolerated the procedure well with no immediate complications.     Medications Ordered in ED Medications  lactated ringers infusion ( Intravenous New Bag/Given 10/15/20 1909)  aztreonam (AZACTAM) 1 g in sodium chloride 0.9 % 100 mL IVPB (has no administration in time range)  vancomycin (VANCOREADY) IVPB 1250 mg/250 mL (has no administration in time range)  povidone-iodine (BETADINE) 10 % external solution (has no administration in time range)  sodium chloride 0.9 % bolus 1,000 mL (1,000 mLs Intravenous New Bag/Given 10/15/20 1905)  lactated ringers bolus 1,000 mL (1,000 mLs Intravenous Bolus from Bag 10/15/20 1623)    And  lactated ringers bolus 1,000 mL (1,000 mLs Intravenous New Bag/Given 10/15/20 1623)    And  lactated ringers bolus 1,000 mL (1,000 mLs Intravenous New Bag/Given 10/15/20 1906)    And  lactated ringers bolus 1,000 mL (1,000 mLs Intravenous New Bag/Given 10/15/20 1906)  acetaminophen (TYLENOL)  tablet 1,000 mg (1,000 mg Oral Given 10/15/20 1616)  ondansetron (ZOFRAN) injection 4 mg (4 mg Intravenous Given 10/15/20 1630)  vancomycin (VANCOREADY) IVPB 2000 mg/400 mL (2,000 mg Intravenous New Bag/Given 10/15/20 1708)  aztreonam (AZACTAM) 2 g in sodium chloride 0.9 % 100 mL IVPB (2 g Intravenous New Bag/Given 10/15/20 1641)  lidocaine (PF) (XYLOCAINE) 1 % injection 30 mL (30 mLs Other Given by Other 10/15/20 2026)  morphine 4 MG/ML injection 4 mg (4 mg Intravenous Given 10/15/20 2025)    ED Course  I have reviewed the triage vital signs and the nursing notes.  Pertinent labs & imaging results that were available during my care of the patient were reviewed by me and considered in my medical decision making (see chart for details).    MDM Rules/Calculators/A&P                          8:29 PM Patient provides no information stating that she had a syncopal event yesterday which she suspects was triggered by pain.  When she woke she was on the floor.  Since then she endorses having moderate headache.  It persist now despite treatment with Tylenol.  CT imaging has been ordered to rule out intracranial injury.  Additionally an informal ultrasound was performed at the bedside to further assess her abdominal wall abscess.  It does appear to be amenable to bedside I&D which will be performed.  8:29 PM  abscess I&D completed.  CT head negative for intracranial injury. Tylenol has not relieved her headache but did relieve her fever.  Pt would benefit from further IV abx of her cellulitis.    Call to hospitalists placed.  Discussed with Dr. Thomes DinningAdefeso of the hospitalist service who accepts pt for admission. Final Clinical Impression(s) / ED Diagnoses Final diagnoses:  Abdominal wall cellulitis  Abdominal wall abscess  Hyperglycemia  Rx / DC Orders ED Discharge Orders    None       Victoriano Lain 10/15/20 2044    Mancel Bale, MD 10/16/20 Marlyne Beards

## 2020-10-15 NOTE — Sepsis Progress Note (Signed)
eLink is monitoring this Code Sepsis. °

## 2020-10-15 NOTE — ED Triage Notes (Signed)
Patient states she has an abscess to abdomen. She states she has a history of staph infections.

## 2020-10-15 NOTE — H&P (Addendum)
History and Physical  Donna Snow WHQ:[id number] DOB: 11-01-1950 DOA: 10/15/2020  Referring physician: Landis Martins PCP: [place]  Patient coming from: Home  Chief Complaint: Right upper abdominal abscess  HPI: Donna Snow is a 70 y.o. female with medical history significant for T2DM, hypertension, hyperlipidemia, GERD, chronic back pain obesity who presents to the emergency department due to an abscess in right upper quadrant.  Patient states that symptoms started about 2 weeks ago with a papule, she was using warm compresses and few days after, this became pustular and eventually burst open a few days after with drainage of pus, she continued to use hot compresses and wanted to go to her PCP, but due to transportation issues, she was unable to go.  3 days ago, she became febrile, weak, lost appetite and has progressively became weaker, she endorsed 2-day onset of headache and reported passing out in her kitchen yesterday for a few minutes due to weakness, there was no biting of tongue, bowel or urinary incontinence.  Patient decided to activate EMS today she was taken to the ED for further evaluation and management.  She ambulates with a cane at baseline, she denies chest pain, shortness of breath, diarrhea or constipation. On review of medical record, it was noted that patient had similar presentation on presenting to [facility] urgent care at [facility] on 11/27/2019 during which an incision and drainage was done and patient was prescribed with antibiotics and pain medication on discharge  ED Course: In the emergency department, febrile with a temperature of 101.81F, RR 20, pulse 95 bpm, BP 135/60, O2 sat 95% on room air.  Work-up in the ED showed normal CBC except for leukocytosis, BMP showed hyponatremia and hyperglycemia.  Albumin 3.3, ALP 147, lactic acid was, urinalysis was positive for glycosuria.  Influenza A, B and SARS coronavirus 2 was negative. CT of head showed  no acute intracranial abnormality Chest x-ray showed mild cardiac enlargement.  No edema or airspace opacity noted Incision and drainage was done, IV hydration was provided, she was started on IV antibiotics and Tylenol was given due to fever.  Hospitalist was asked to admit patient for further evaluation and management.  Review of Systems: Constitutional: Positive for chills and fever.  HENT: Negative for ear pain and sore throat.   Eyes: Negative for pain and visual disturbance.  Respiratory: Negative for cough, chest tightness and shortness of breath.   Cardiovascular: Negative for chest pain and palpitations.  Gastrointestinal: Negative for abdominal pain and vomiting.  Endocrine: Negative for polyphagia and polyuria.  Genitourinary: Negative for decreased urine volume, dysuria, enuresis Musculoskeletal: Negative for arthralgias and back pain.  Skin: Abdominal wall positive for  wound and color change  Allergic/Immunologic: Negative for immunocompromised state.  Neurological: Negative for tremors, syncope, speech difficulty, weakness, light-headedness and headaches.  Hematological: Does not bruise/bleed easily.  All other systems reviewed and are negative   Past Medical History:  Diagnosis Date  . Diabetes mellitus   . Hypercholesteremia   . Hypertension   . Obesity    Past Surgical History:  Procedure Laterality Date  . ABDOMINAL HYSTERECTOMY    . ABDOMINAL SURGERY    . CHOLECYSTECTOMY    . JOINT REPLACEMENT      Social History:  reports that she has never smoked. She has never used smokeless tobacco. She reports that she does not drink alcohol and does not use drugs.   Allergies  Allergen Reactions  . Latex Other (See Comments) and Hives  .  Penicillins     Family History  Adopted: Yes    Prior to Admission medications   Medication Sig Start Date End Date Taking? Authorizing Provider  albuterol (VENTOLIN HFA) 108 (90 Base) MCG/ACT inhaler Inhale 2 puffs into the  lungs every 4 (four) hours as needed for shortness of breath. 06/06/20  Yes [provider]  atorvastatin (LIPITOR) 40 MG tablet Take 40 mg by mouth daily.   Yes [provider]  budesonide-formoterol (SYMBICORT) 80-4.5 MCG/ACT inhaler Inhale 2 puffs into the lungs 2 (two) times daily. 11/22/18  Yes [provider]  busPIRone (BUSPAR) 10 MG tablet Take 20 mg by mouth 3 (three) times daily. 08/16/20  Yes [provider]  cetirizine (ZYRTEC) 10 MG tablet Take 10 mg by mouth daily.   Yes [provider]  cyclobenzaprine (FLEXERIL) 10 MG tablet Take 1 tablet (10 mg total) by mouth 2 (two) times daily as needed for muscle spasms. 03/07/15  Yes Dorie Rank, MD  DULoxetine (CYMBALTA) 60 MG capsule Take 60 mg by mouth 2 (two) times daily.   Yes [provider]  ezetimibe (ZETIA) 10 MG tablet Take 1 tablet by mouth daily. 07/25/18  Yes [provider]  fluticasone furoate-vilanterol (BREO ELLIPTA) 100-25 MCG/INH AEPB Inhale 1 puff into the lungs daily. 03/08/20  Yes [provider]  furosemide (LASIX) 40 MG tablet Take 40 mg by mouth daily. 09/27/20  Yes [provider]  Hyoscyamine Sulfate SL 0.125 MG SUBL Place 1 tablet under the tongue 3 (three) times daily. 08/23/18  Yes [provider]  IBU 800 MG tablet Take 800 mg by mouth every 8 (eight) hours. 06/06/20  Yes [provider]  insulin regular human CONCENTRATED (HUMULIN R) 500 UNIT/ML injection Inject 12-15 Units into the skin 3 (three) times daily with meals. Sliding scale   Yes [provider]  LORazepam (ATIVAN) 2 MG tablet Take 2 mg by mouth 2 (two) times daily.   Yes [provider]  losartan (COZAAR) 50 MG tablet Take 1 tablet by mouth daily. 09/21/20  Yes [provider]  omeprazole (PRILOSEC) 40 MG capsule Take 1 capsule by mouth daily. 06/05/20  Yes [provider]  phenytoin (DILANTIN) 100 MG ER capsule Take 400 mg by  mouth daily.   Yes [provider]  rosuvastatin (CRESTOR) 40 MG tablet Take 1 tablet by mouth daily. 12/22/18  Yes [provider]  tiZANidine (ZANAFLEX) 4 MG tablet Take 4 mg by mouth 3 (three) times daily. 09/01/20  Yes [provider]  topiramate (TOPAMAX) 100 MG tablet Take 200 mg by mouth 2 (two) times daily. 06/06/20  Yes [provider]  traZODone (DESYREL) 50 MG tablet Take 50 mg by mouth at bedtime. 08/17/20  Yes [provider]  acyclovir (ZOVIRAX) 400 MG tablet Take 1 tablet (400 mg total) by mouth 5 (five) times daily. Patient not taking: No sig reported 04/11/14   Carlisle Cater, PA-C  citalopram (CELEXA) 20 MG tablet Take by mouth. Patient not taking: Reported on 10/15/2020    [provider]  dicyclomine (BENTYL) 10 MG capsule Take by mouth. Patient not taking: Reported on 10/15/2020    [provider]  HYDROcodone-acetaminophen (NORCO/VICODIN) 5-325 MG tablet Take 1-2 tablets by mouth every 6 (six) hours as needed. Patient not taking: No sig reported 04/10/20   Davonna Belling, MD  lisinopril-hydrochlorothiazide (PRINZIDE,ZESTORETIC) 10-12.5 MG per tablet Take 1 tablet by mouth daily.   Patient not taking: No sig reported  [provider]  nystatin cream (MYCOSTATIN) Apply to affected area 2 times daily Patient not taking: Reported on 10/15/2020 11/21/14   Piepenbrink, Anderson Malta, PA-C  sulfamethoxazole-trimethoprim (BACTRIM DS) 800-160 MG tablet Take 1 tablet by mouth 2 (two) times daily. Patient not taking: Reported on 10/15/2020    [provider]  traMADol (ULTRAM) 50 MG tablet Take by mouth. Patient not taking: Reported on 10/15/2020    [provider]  fexofenadine (ALLEGRA) 180 MG tablet Take 180 mg by mouth daily.    11/27/19  [provider]  glipiZIDE (GLUCOTROL) 10 MG tablet Take 10 mg by mouth 2 (two) times daily before a meal.    11/27/19  [provider]   sitaGLIPtan-metformin (JANUMET) 50-1000 MG per tablet Take 1 tablet by mouth 2 (two) times daily with a meal.    11/27/19  [provider]  zolpidem (AMBIEN) 10 MG tablet Take 10 mg by mouth at bedtime as needed.    11/27/19  [provider]    Physical Exam: BP (!) 147/46 (BP Location: Left Arm)   Pulse 82   Temp 99.2 F (37.3 C) (Oral)   Resp 20   Ht 5' 5"  (1.651 m)   SpO2 97%   BMI 47.11 kg/m   . General: 70 y.o. year-old female well developed well nourished in no acute distress.  Alert and oriented x3. Marland Kitchen HEENT: Dry mucous membrane, NCAT, EOMI . Neck: Supple, trachea medial . Cardiovascular: Regular rate and rhythm with no rubs or gallops.  No thyromegaly or JVD noted.  No lower extremity edema. 2/4 pulses in all 4 extremities. Marland Kitchen Respiratory: Clear to auscultation with no wheezes or rales. Good inspiratory effort. . Abdomen: Soft, nontender nondistended with normal bowel sounds x4 quadrants. . Muskuloskeletal: No cyanosis, clubbing or edema noted bilaterally . Neuro: CN II-XII intact, strength 5/5 x 4, sensation, reflexes intact . Skin: Abdominal wall ulcerative lesion with surrounding erythema which is tender and warm to touch . Psychiatry: Mood is appropriate for condition and setting          Labs on Admission:  Basic Metabolic Panel: Recent Labs  Lab 10/15/20 1557  NA 132*  K 4.4  CL 100  CO2 24  GLUCOSE 375*  BUN 19  CREATININE 0.92  CALCIUM 8.5*   Liver Function Tests: Recent Labs  Lab 10/15/20 1557  AST 12*  ALT 20  ALKPHOS 147*  BILITOT 1.0  PROT 7.5  ALBUMIN 3.3*   No results for input(s): LIPASE, AMYLASE in the last 168 hours. No results for input(s): AMMONIA in the last 168 hours. CBC: Recent Labs  Lab 10/15/20 1557  WBC 12.7*  NEUTROABS 9.8*  HGB 13.1  HCT 40.5  MCV 90.0  PLT 248   Cardiac Enzymes: No results for input(s): CKTOTAL, CKMB, CKMBINDEX, TROPONINI in the last 168 hours.  BNP (last 3 results) No results  for input(s): BNP in the last 8760 hours.  ProBNP (last 3 results) No results for input(s): PROBNP in the last 8760 hours.  CBG: Recent Labs  Lab 10/15/20 2054  GLUCAP 303*    Radiological Exams on Admission: CT Head Wo Contrast  Result Date: 10/15/2020 CLINICAL DATA:  Dizziness, syncope EXAM: CT HEAD WITHOUT CONTRAST TECHNIQUE: Contiguous axial images were obtained from the base of the skull through the vertex without intravenous contrast. COMPARISON:  None. FINDINGS: Brain: No acute intracranial abnormality. Specifically, no hemorrhage, hydrocephalus, mass lesion, acute infarction, or significant intracranial injury. Vascular: No hyperdense vessel or unexpected calcification.  Skull: No acute calvarial abnormality. Sinuses/Orbits: No acute findings Other: None IMPRESSION: No acute intracranial abnormality. Electronically Signed   By: Rolm Baptise M.D.   On: 10/15/2020 18:59   DG Chest Port 1 View  Result Date: 10/15/2020 CLINICAL DATA:  Shortness of breath EXAM: PORTABLE CHEST 1 VIEW COMPARISON:  April 10, 2020 FINDINGS: There is no edema or airspace opacity. Heart is mildly enlarged with pulmonary vascularity normal. No adenopathy. There is degenerative change in each shoulder. IMPRESSION: Mild cardiac enlargement.  No edema or airspace opacity. Electronically Signed   By: Lowella Grip III M.D.   On: 10/15/2020 16:44    EKG: I independently viewed the EKG done and my findings are as followed: EKG was not done in the ED  Assessment/Plan Present on Admission: . Abdominal wall cellulitis  Principal Problem:   Abdominal wall abscess Active Problems:   Abdominal wall cellulitis   SIRS (systemic inflammatory response syndrome) (HCC)   Leukocytosis   Hyponatremia   Hyperglycemia due to diabetes mellitus (HCC)   Hypoalbuminemia due to protein-calorie malnutrition (HCC)   Elevated AST (SGOT)   Essential hypertension   Hyperlipidemia   Syncope and collapse   GERD  (gastroesophageal reflux disease)   Chronic back pain   Dehydration   SIRS secondary to abdominal wall abscess with superimposed surrounding cellulitis  Patient was febrile and presents with leukocytosis (met SIRS criteria) She was started on IV aztreonam and vancomycin, we shall continue with same at this time Continue IV hydration Continue Tylenol as needed for fever Continue IV Zofran 4 mg every 6 hours as needed for nausea/vomiting Continue IV morphine 2 mg every 4 hours as needed for moderate/severe pain Continue wound care Abscess drained in the ED, consider surgical consult for further incision and drainage if patient continues with worsening pus drainage or worsening symptoms  Leukocytosis secondary to above WBC 12.7, continue management as described above  Syncope and collapse Patient endorsed passing out in her kitchen yesterday, this is possibly due to vasovagal considering her dehydrated state. IV hydration provided in the ED, continue IV hydration Orthostatic vital signs will be checked Continue telemetry, echocardiogram will be done in the morning  Hyponatremia/dehydration Na 132, IV hydration provided Continue to monitor sodium levels  Hyperglycemia secondary to T2DM Continue ISS and hypoglycemia protocol  Hypoalbuminemia secondary to mild protein calorie malnutrition Albumin 3.3, protein supplement will be provided  Isolated elevated ALP ALP 147, continue to monitor liver enzymes  Essential hypertension (controlled) Continue losartan  Hyperlipidemia Statin will be held at this time due to elevated liver enzyme  GERD Continue Protonix  Chronic back pain Continue Flexeril, Tylenol as needed  COPD Continue Ventolin, Breo Ellipta  Seizures Continue Dilantin  Depression and anxiety Continue Cymbalta and buspirone  DVT prophylaxis: Lovenox  Code Status: Full code  Family Communication: None at bedside  Disposition Plan:  Patient is from:                         home Anticipated DC to:                   SNF or family members home Anticipated DC date:               2-3 days Anticipated DC barriers:         patient requires inpatient management due to abdominal abscess with surrounding cellulitis requiring IV antibiotics  Consults called: None  Admission status: Inpatient  Bernadette Hoit MD [facility]  10/15/2020, 9:37 PM

## 2020-10-16 ENCOUNTER — Other Ambulatory Visit: Payer: Self-pay

## 2020-10-16 ENCOUNTER — Inpatient Hospital Stay (HOSPITAL_COMMUNITY): Payer: Medicare HMO

## 2020-10-16 ENCOUNTER — Encounter (HOSPITAL_COMMUNITY): Payer: Self-pay | Admitting: Internal Medicine

## 2020-10-16 DIAGNOSIS — E1165 Type 2 diabetes mellitus with hyperglycemia: Secondary | ICD-10-CM

## 2020-10-16 DIAGNOSIS — I350 Nonrheumatic aortic (valve) stenosis: Secondary | ICD-10-CM

## 2020-10-16 DIAGNOSIS — R55 Syncope and collapse: Secondary | ICD-10-CM | POA: Diagnosis not present

## 2020-10-16 DIAGNOSIS — L02211 Cutaneous abscess of abdominal wall: Secondary | ICD-10-CM

## 2020-10-16 DIAGNOSIS — A419 Sepsis, unspecified organism: Secondary | ICD-10-CM

## 2020-10-16 DIAGNOSIS — Z794 Long term (current) use of insulin: Secondary | ICD-10-CM

## 2020-10-16 DIAGNOSIS — E119 Type 2 diabetes mellitus without complications: Secondary | ICD-10-CM

## 2020-10-16 LAB — PROTIME-INR
INR: 1.2 (ref 0.8–1.2)
Prothrombin Time: 15.1 seconds (ref 11.4–15.2)

## 2020-10-16 LAB — BLOOD CULTURE ID PANEL (REFLEXED) - BCID2

## 2020-10-16 LAB — CBC
HCT: 39.9 % (ref 36.0–46.0)
Hemoglobin: 12.6 g/dL (ref 12.0–15.0)
MCH: 28.8 pg (ref 26.0–34.0)
MCHC: 31.6 g/dL (ref 30.0–36.0)
MCV: 91.1 fL (ref 80.0–100.0)
Platelets: 236 10*3/uL (ref 150–400)
RBC: 4.38 MIL/uL (ref 3.87–5.11)
RDW: 13.2 % (ref 11.5–15.5)
WBC: 9.7 10*3/uL (ref 4.0–10.5)
nRBC: 0 % (ref 0.0–0.2)

## 2020-10-16 LAB — COMPREHENSIVE METABOLIC PANEL
ALT: 18 U/L (ref 0–44)
AST: 12 U/L — ABNORMAL LOW (ref 15–41)
Albumin: 2.9 g/dL — ABNORMAL LOW (ref 3.5–5.0)
Alkaline Phosphatase: 129 U/L — ABNORMAL HIGH (ref 38–126)
Anion gap: 7 (ref 5–15)
BUN: 10 mg/dL (ref 8–23)
CO2: 26 mmol/L (ref 22–32)
Calcium: 8.3 mg/dL — ABNORMAL LOW (ref 8.9–10.3)
Chloride: 103 mmol/L (ref 98–111)
Creatinine, Ser: 0.68 mg/dL (ref 0.44–1.00)
GFR, Estimated: >60 mL/min (ref >60)
Glucose, Bld: 304 mg/dL — ABNORMAL HIGH (ref 70–99)
Potassium: 4 mmol/L (ref 3.5–5.1)
Sodium: 136 mmol/L (ref 135–145)
Total Bilirubin: 1 mg/dL (ref 0.3–1.2)
Total Protein: 6.8 g/dL (ref 6.5–8.1)

## 2020-10-16 LAB — ECHOCARDIOGRAM COMPLETE
AR max vel: 1.87 cm2
AV Area VTI: 1.76 cm2
AV Area mean vel: 1.85 cm2
AV Mean grad: 12 mmHg
AV Peak grad: 24.2 mmHg
Ao pk vel: 2.46 m/s
Area-P 1/2: 3.08 cm2
Height: 65 in
S' Lateral: 2.65 cm

## 2020-10-16 LAB — GLUCOSE, CAPILLARY
Glucose-Capillary: 300 mg/dL — ABNORMAL HIGH (ref 70–99)
Glucose-Capillary: 219 mg/dL — ABNORMAL HIGH (ref 70–99)
Glucose-Capillary: 239 mg/dL — ABNORMAL HIGH (ref 70–99)
Glucose-Capillary: 265 mg/dL — ABNORMAL HIGH (ref 70–99)

## 2020-10-16 LAB — MAGNESIUM: Magnesium: 1.8 mg/dL (ref 1.7–2.4)

## 2020-10-16 LAB — HIV ANTIBODY (ROUTINE TESTING W REFLEX): HIV Screen 4th Generation wRfx: NONREACTIVE

## 2020-10-16 LAB — APTT: aPTT: 30 seconds (ref 24–36)

## 2020-10-16 LAB — PHOSPHORUS: Phosphorus: 2.3 mg/dL — ABNORMAL LOW (ref 2.5–4.6)

## 2020-10-16 MED ORDER — PHENYTOIN SODIUM EXTENDED 100 MG PO CAPS
400.0000 mg | ORAL_CAPSULE | Freq: Every day | ORAL | Status: DC
  Administered 2020-10-16 – 2020-10-22 (×7): 400 mg via ORAL
  Filled 2020-10-16 (×7): qty 4

## 2020-10-16 MED ORDER — BUSPIRONE HCL 5 MG PO TABS
20.0000 mg | ORAL_TABLET | Freq: Three times a day (TID) | ORAL | Status: DC
  Administered 2020-10-16 – 2020-10-22 (×19): 20 mg via ORAL
  Filled 2020-10-16 (×19): qty 4

## 2020-10-16 MED ORDER — ALBUTEROL SULFATE (2.5 MG/3ML) 0.083% IN NEBU
2.5000 mg | INHALATION_SOLUTION | RESPIRATORY_TRACT | Status: DC | PRN
  Administered 2020-10-21: 2.5 mg via RESPIRATORY_TRACT
  Filled 2020-10-16: qty 3

## 2020-10-16 MED ORDER — INSULIN GLARGINE 100 UNIT/ML ~~LOC~~ SOLN
12.0000 [IU] | Freq: Every day | SUBCUTANEOUS | Status: DC
  Administered 2020-10-16: 12 [IU] via SUBCUTANEOUS
  Filled 2020-10-16 (×3): qty 0.12

## 2020-10-16 MED ORDER — ALBUTEROL SULFATE HFA 108 (90 BASE) MCG/ACT IN AERS: 2.0000 | INHALATION_SPRAY | RESPIRATORY_TRACT | Status: DC | PRN

## 2020-10-16 MED ORDER — PERFLUTREN LIPID MICROSPHERE
1.0000 mL | INTRAVENOUS | Status: AC | PRN
  Administered 2020-10-16: 3 mL via INTRAVENOUS
  Filled 2020-10-16: qty 10

## 2020-10-16 MED ORDER — SODIUM CHLORIDE 0.9 % IV SOLN
2.0000 g | INTRAVENOUS | Status: DC
  Administered 2020-10-16 – 2020-10-18 (×3): 2 g via INTRAVENOUS
  Filled 2020-10-16 (×3): qty 20

## 2020-10-16 MED ORDER — MORPHINE SULFATE (PF) 2 MG/ML IV SOLN
2.0000 mg | INTRAVENOUS | Status: DC | PRN
  Administered 2020-10-16 – 2020-10-22 (×26): 2 mg via INTRAVENOUS
  Filled 2020-10-16 (×26): qty 1

## 2020-10-16 MED ORDER — POLYETHYLENE GLYCOL 3350 17 G PO PACK
17.0000 g | PACK | Freq: Every day | ORAL | Status: DC
  Administered 2020-10-16 – 2020-10-21 (×5): 17 g via ORAL
  Filled 2020-10-16 (×8): qty 1

## 2020-10-16 MED ORDER — HYOSCYAMINE SULFATE 0.125 MG PO TBDP
0.1250 mg | ORAL_TABLET | Freq: Four times a day (QID) | ORAL | Status: DC | PRN
  Administered 2020-10-16 – 2020-10-21 (×4): 0.125 mg via SUBLINGUAL
  Filled 2020-10-16 (×9): qty 1

## 2020-10-16 MED ORDER — FLUTICASONE FUROATE-VILANTEROL 100-25 MCG/INH IN AEPB
1.0000 | INHALATION_SPRAY | Freq: Every day | RESPIRATORY_TRACT | Status: DC
  Administered 2020-10-16 – 2020-10-22 (×7): 1 via RESPIRATORY_TRACT
  Filled 2020-10-16 (×2): qty 28

## 2020-10-16 MED ORDER — DULOXETINE HCL 60 MG PO CPEP
60.0000 mg | ORAL_CAPSULE | Freq: Every day | ORAL | Status: DC
  Administered 2020-10-16 – 2020-10-21 (×6): 60 mg via ORAL
  Filled 2020-10-16 (×6): qty 1

## 2020-10-16 MED ORDER — SIMETHICONE 40 MG/0.6ML PO SUSP: 40.0000 mg | Freq: Once | ORAL | Status: DC

## 2020-10-16 MED ORDER — HYDROXYZINE HCL 25 MG PO TABS
25.0000 mg | ORAL_TABLET | Freq: Three times a day (TID) | ORAL | Status: DC | PRN
  Administered 2020-10-16 – 2020-10-22 (×3): 25 mg via ORAL
  Filled 2020-10-16 (×3): qty 1

## 2020-10-16 MED ORDER — LIDOCAINE HCL (PF) 1 % IJ SOLN
10.0000 mL | Freq: Once | INTRAMUSCULAR | Status: DC
  Filled 2020-10-16: qty 10

## 2020-10-16 NOTE — Consult Note (Signed)
[facility] Consult  Reason for Consult: Abdominal wall abscess  Referring Physician:  Dr. Arbutus Leas   Chief Complaint    Abscess      HPI: Donna Snow is a 70 y.o. female with an abdominal wall abscess that has been present for about 3 weeks. It started out as cellulitis at some hairs and has gotten worse and progressed. It was draining in the ED yesterday and Ed opened up the draining area with a hemostat but no incisions made.  She says that she has abscesses periodically in this area. She has never been told she has any MRSA or anything. She could not get to her PCP for antibiotics before yesterday due to no transportation.  The pain in the area is constant and throbbing in nature.   Past Medical History:  Diagnosis Date  . Diabetes mellitus   . Hypercholesteremia   . Hypertension   . Obesity     Past Surgical History:  Procedure Laterality Date  . ABDOMINAL HYSTERECTOMY    . ABDOMINAL SURGERY    . CHOLECYSTECTOMY    . JOINT REPLACEMENT      Family History  Adopted: Yes    Social History   Tobacco Use  . Smoking status: Never Smoker  . Smokeless tobacco: Never Used  Vaping Use  . Vaping Use: Never used  Substance Use Topics  . Alcohol use: No  . Drug use: No    Medications:  I have reviewed the patient's current medications. Prior to Admission:  Medications Prior to Admission  Medication Sig Dispense Refill Last Dose  . albuterol (VENTOLIN HFA) 108 (90 Base) MCG/ACT inhaler Inhale 2 puffs into the lungs every 4 (four) hours as needed for shortness of breath.   Past Week at Unknown time  . atorvastatin (LIPITOR) 40 MG tablet Take 40 mg by mouth daily.   10/14/2020 at Unknown time  . budesonide-formoterol (SYMBICORT) 80-4.5 MCG/ACT inhaler Inhale 2 puffs into the lungs 2 (two) times daily.   10/14/2020 at Unknown time  . busPIRone (BUSPAR) 10 MG tablet Take 20 mg by mouth 3 (three) times daily.   10/14/2020 at Unknown time  . cetirizine (ZYRTEC) 10 MG  tablet Take 10 mg by mouth daily.   10/14/2020 at Unknown time  . cyclobenzaprine (FLEXERIL) 10 MG tablet Take 1 tablet (10 mg total) by mouth 2 (two) times daily as needed for muscle spasms. 20 tablet 0 10/14/2020 at Unknown time  . DULoxetine (CYMBALTA) 60 MG capsule Take 60 mg by mouth 2 (two) times daily.   10/14/2020 at Unknown time  . ezetimibe (ZETIA) 10 MG tablet Take 1 tablet by mouth daily.   10/14/2020 at Unknown time  . fluticasone furoate-vilanterol (BREO ELLIPTA) 100-25 MCG/INH AEPB Inhale 1 puff into the lungs daily.   10/14/2020 at Unknown time  . furosemide (LASIX) 40 MG tablet Take 40 mg by mouth daily.   10/14/2020 at Unknown time  . Hyoscyamine Sulfate SL 0.125 MG SUBL Place 1 tablet under the tongue 3 (three) times daily.   Past Month at Unknown time  . IBU 800 MG tablet Take 800 mg by mouth every 8 (eight) hours.     . insulin regular human CONCENTRATED (HUMULIN R) 500 UNIT/ML injection Inject 12-15 Units into the skin 3 (three) times daily with meals. Sliding scale   10/14/2020 at Unknown time  . LORazepam (ATIVAN) 2 MG tablet Take 2 mg by mouth 2 (two) times daily.   10/14/2020 at Unknown time  .  losartan (COZAAR) 50 MG tablet Take 1 tablet by mouth daily.   10/14/2020 at Unknown time  . omeprazole (PRILOSEC) 40 MG capsule Take 1 capsule by mouth daily.   10/14/2020 at Unknown time  . phenytoin (DILANTIN) 100 MG ER capsule Take 400 mg by mouth daily.   10/14/2020 at Unknown time  . rosuvastatin (CRESTOR) 40 MG tablet Take 1 tablet by mouth daily.   10/14/2020 at Unknown time  . tiZANidine (ZANAFLEX) 4 MG tablet Take 4 mg by mouth 3 (three) times daily.   10/14/2020 at Unknown time  . topiramate (TOPAMAX) 100 MG tablet Take 200 mg by mouth 2 (two) times daily.     . traZODone (DESYREL) 50 MG tablet Take 50 mg by mouth at bedtime.   10/14/2020 at Unknown time  . acyclovir (ZOVIRAX) 400 MG tablet Take 1 tablet (400 mg total) by mouth 5 (five) times daily. (Patient not taking: No sig reported) 35 tablet 0  Not Taking at Unknown time  . citalopram (CELEXA) 20 MG tablet Take by mouth. (Patient not taking: Reported on 10/15/2020)   Not Taking at Unknown time  . dicyclomine (BENTYL) 10 MG capsule Take by mouth. (Patient not taking: Reported on 10/15/2020)   Not Taking at Unknown time  . HYDROcodone-acetaminophen (NORCO/VICODIN) 5-325 MG tablet Take 1-2 tablets by mouth every 6 (six) hours as needed. (Patient not taking: No sig reported) 6 tablet 0 Not Taking at Unknown time  . lisinopril-hydrochlorothiazide (PRINZIDE,ZESTORETIC) 10-12.5 MG per tablet Take 1 tablet by mouth daily.   (Patient not taking: No sig reported)   Not Taking at Unknown time  . nystatin cream (MYCOSTATIN) Apply to affected area 2 times daily (Patient not taking: Reported on 10/15/2020) 30 g 0 Not Taking at Unknown time  . sulfamethoxazole-trimethoprim (BACTRIM DS) 800-160 MG tablet Take 1 tablet by mouth 2 (two) times daily. (Patient not taking: Reported on 10/15/2020)   Not Taking at Unknown time  . traMADol (ULTRAM) 50 MG tablet Take by mouth. (Patient not taking: Reported on 10/15/2020)   Not Taking at Unknown time   Scheduled: . busPIRone  20 mg Oral TID  . DULoxetine  60 mg Oral Daily  . enoxaparin (LOVENOX) injection  40 mg Subcutaneous Q24H  . feeding supplement (GLUCERNA SHAKE)  237 mL Oral TID BM  . fluticasone furoate-vilanterol  1 puff Inhalation Daily  . insulin aspart  0-20 Units Subcutaneous TID WC  . insulin aspart  0-5 Units Subcutaneous QHS  . insulin glargine  12 Units Subcutaneous Daily  . lidocaine (PF)  10 mL Intradermal Once  . losartan  50 mg Oral Daily  . nystatin   Topical BID  . pantoprazole  40 mg Oral Daily  . phenytoin  400 mg Oral Daily  . polyethylene glycol  17 g Oral Daily  . traZODone  50 mg Oral QHS   Continuous: . vancomycin 1,250 mg (10/16/20 0512)   ZOX:[id number], albuterol, cyclobenzaprine, hyoscyamine, morphine injection, ondansetron (ZOFRAN) IV, povidone-iodine  Allergies  Allergen  Reactions  . Latex Other (See Comments) and Hives  . Penicillins      ROS:  A comprehensive review of systems was negative except for: Constitutional: positive for chills and fevers Gastrointestinal: positive for abdominal pain and draining wound abdominal wall  Blood pressure (!) 145/57, pulse 82, temperature 98.9 F (37.2 C), temperature source Oral, resp. rate 18, height 5\' 5"  (1.651 m), SpO2 96 %. Physical Exam Vitals reviewed.  Constitutional:      Appearance: She is  obese.  HENT:     Head: Normocephalic.     Nose: Nose normal.  Eyes:     Extraocular Movements: Extraocular movements intact.  Cardiovascular:     Rate and Rhythm: Normal rate.  Pulmonary:     Effort: Pulmonary effort is normal.  Abdominal:     General: There is no distension.     Palpations: Abdomen is soft.     Tenderness: There is abdominal tenderness.     Comments: RUQ scar from cholecystectomy, above this open draining wound with induration, cellulitis of the skin extending out  Skin:    General: Skin is warm.  Neurological:     General: No focal deficit present.     Mental Status: She is oriented to person, place, and time.  Psychiatric:        Mood and Affect: Mood normal.        Behavior: Behavior normal.        Thought Content: Thought content normal.        Judgment: Judgment normal.     Results: Results for orders placed or performed during the hospital encounter of 10/15/20 (from the past 48 hour(s))  Urinalysis, Routine w reflex microscopic Urine, Clean Catch     Status: Abnormal   Collection Time: 10/15/20  3:41 PM  Result Value Ref Range   Color, Urine YELLOW YELLOW   APPearance CLEAR CLEAR   Specific Gravity, Urine 1.016 1.005 - 1.030   pH 7.0 5.0 - 8.0   Glucose, UA >=500 (A) NEGATIVE mg/dL   Hgb urine dipstick NEGATIVE NEGATIVE   Bilirubin Urine NEGATIVE NEGATIVE   Ketones, ur 20 (A) NEGATIVE mg/dL   Protein, ur NEGATIVE NEGATIVE mg/dL   Nitrite NEGATIVE NEGATIVE    Leukocytes,Ua NEGATIVE NEGATIVE   RBC / HPF 0-5 0 - 5 RBC/hpf   WBC, UA 0-5 0 - 5 WBC/hpf   Bacteria, UA RARE (A) NONE SEEN   Squamous Epithelial / LPF 0-5 0 - 5    Comment: Performed at [facility], [address] [place], [place] [address]  Lactic acid, plasma     Status: None   Collection Time: 10/15/20  3:57 PM  Result Value Ref Range   Lactic Acid, Venous 1.3 0.5 - 1.9 mmol/L    Comment: Performed at [facility], [address] [place], [place] [address]  Comprehensive metabolic panel     Status: Abnormal   Collection Time: 10/15/20  3:57 PM  Result Value Ref Range   Sodium 132 (L) 135 - 145 mmol/L   Potassium 4.4 3.5 - 5.1 mmol/L   Chloride 100 98 - 111 mmol/L   CO2 24 22 - 32 mmol/L   Glucose, Bld 375 (H) 70 - 99 mg/dL    Comment: Glucose reference range applies only to samples taken after fasting for at least 8 hours.   BUN 19 8 - 23 mg/dL   Creatinine, Ser 1.88 0.44 - 1.00 mg/dL   Calcium 8.5 (L) 8.9 - 10.3 mg/dL   Total Protein 7.5 6.5 - 8.1 g/dL   Albumin 3.3 (L) 3.5 - 5.0 g/dL   AST 12 (L) 15 - 41 U/L   ALT 20 0 - 44 U/L   Alkaline Phosphatase 147 (H) 38 - 126 U/L   Total Bilirubin 1.0 0.3 - 1.2 mg/dL   GFR, Estimated >41 >66 mL/min    Comment: (NOTE) Calculated using the CKD-EPI Creatinine Equation (2021)    Anion gap 8 5 - 15    Comment:  Performed at [facility], [address], [place], [place] [address]  CBC WITH DIFFERENTIAL     Status: Abnormal   Collection Time: 10/15/20  3:57 PM  Result Value Ref Range   WBC 12.7 (H) 4.0 - 10.5 K/uL   RBC 4.50 3.87 - 5.11 MIL/uL   Hemoglobin 13.1 12.0 - 15.0 g/dL   HCT 28.3 15.1 - 76.1 %   MCV 90.0 80.0 - 100.0 fL   MCH 29.1 26.0 - 34.0 pg   MCHC 32.3 30.0 - 36.0 g/dL   RDW 60.7 37.1 - 06.2 %   Platelets 248 150 - 400 K/uL   nRBC 0.0 0.0 - 0.2 %   Neutrophils Relative % 78 %   Neutro Abs 9.8 (H) 1.7 - 7.7 K/uL   Lymphocytes Relative 14 %   Lymphs Abs 1.7 0.7 - 4.0 K/uL   Monocytes Relative 8 %   Monocytes  Absolute 1.0 0.1 - 1.0 K/uL   Eosinophils Relative 0 %   Eosinophils Absolute 0.0 0.0 - 0.5 K/uL   Basophils Relative 0 %   Basophils Absolute 0.0 0.0 - 0.1 K/uL   Immature Granulocytes 0 %   Abs Immature Granulocytes 0.05 0.00 - 0.07 K/uL    Comment: Performed at [facility], [address] [place], [place] [address]  Protime-INR     Status: None   Collection Time: 10/15/20  3:57 PM  Result Value Ref Range   Prothrombin Time 14.3 11.4 - 15.2 seconds   INR 1.1 0.8 - 1.2    Comment: (NOTE) INR goal varies based on device and disease states. Performed at [facility], [address] [place], [place] [address]   APTT     Status: None   Collection Time: 10/15/20  3:57 PM  Result Value Ref Range   aPTT 29 24 - 36 seconds    Comment: Performed at [facility], [address], [place], [place] [address]  Blood Culture (routine x 2)     Status: None (Preliminary result)   Collection Time: 10/15/20  3:58 PM   Specimen: BLOOD  Result Value Ref Range   Specimen Description BLOOD    Special Requests NONE    Culture  Setup Time      GRAM POSITIVE COCCI AEROBIC BOTTLE ONLY Gram Stain Report Called to,Read Back By and Verified With: COLEMAN @ 1309 ON [id number] BY HENDERSON L.    Culture      NO GROWTH < 24 HOURS Performed at [facility], [address], [place], [place] [address]    Report Status PENDING   Blood Culture (routine x 2)     Status: None (Preliminary result)   Collection Time: 10/15/20  4:07 PM   Specimen: Right Antecubital; Blood  Result Value Ref Range   Specimen Description      RIGHT ANTECUBITAL BOTTLES DRAWN AEROBIC AND ANAEROBIC   Special Requests Blood Culture adequate volume    Culture      NO GROWTH < 24 HOURS Performed at [facility], [address], [place], [place] [address]    Report Status PENDING   Resp Panel by RT-PCR (Flu A&B, Covid) Nasopharyngeal Swab     Status: None   Collection Time: 10/15/20  5:17 PM   Specimen: Nasopharyngeal Swab;  Nasopharyngeal(NP) swabs in vial transport medium  Result Value Ref Range   SARS Coronavirus 2 by RT PCR NEGATIVE NEGATIVE    Comment: (NOTE) SARS-CoV-2 target nucleic acids are NOT DETECTED.  The SARS-CoV-2 RNA is generally detectable  in upper respiratory specimens during the acute phase of infection. The lowest concentration of SARS-CoV-2 viral copies this assay can detect is 138 copies/mL. A negative result does not preclude SARS-Cov-2 infection and should not be used as the sole basis for treatment or other patient management decisions. A negative result may occur with  improper specimen collection/handling, submission of specimen other than nasopharyngeal swab, presence of viral mutation(s) within the areas targeted by this assay, and inadequate number of viral copies(<138 copies/mL). A negative result must be combined with clinical observations, patient history, and epidemiological information. The expected result is Negative.  Fact Sheet for Patients:  BloggerCourse.com  Fact Sheet for Healthcare Providers:  SeriousBroker.it  This test is no t yet approved or cleared by the [place] FDA and  has been authorized for detection and/or diagnosis of SARS-CoV-2 by FDA under an Emergency Use Authorization (EUA). This EUA will remain  in effect (meaning this test can be used) for the duration of the COVID-19 declaration under Section 564(b)(1) of the Act, 21 U.S.C.section 360bbb-3(b)(1), unless the authorization is terminated  or revoked sooner.       Influenza A by PCR NEGATIVE NEGATIVE   Influenza B by PCR NEGATIVE NEGATIVE    Comment: (NOTE) The Xpert Xpress SARS-CoV-2/FLU/RSV plus assay is intended as an aid in the diagnosis of influenza from Nasopharyngeal swab specimens and should not be used as a sole basis for treatment. Nasal washings and aspirates are unacceptable for Xpert Xpress  SARS-CoV-2/FLU/RSV testing.  Fact Sheet for Patients: BloggerCourse.com  Fact Sheet for Healthcare Providers: SeriousBroker.it  This test is not yet approved or cleared by the [place] FDA and has been authorized for detection and/or diagnosis of SARS-CoV-2 by FDA under an Emergency Use Authorization (EUA). This EUA will remain in effect (meaning this test can be used) for the duration of the COVID-19 declaration under Section 564(b)(1) of the Act, 21 U.S.C. section 360bbb-3(b)(1), unless the authorization is terminated or revoked.  Performed at [facility], [address] [place], [place] [address]   Lactic acid, plasma     Status: None   Collection Time: 10/15/20  7:19 PM  Result Value Ref Range   Lactic Acid, Venous 1.6 0.5 - 1.9 mmol/L    Comment: Performed at [facility], [address], [place], [place] [address]  CBG monitoring, ED     Status: Abnormal   Collection Time: 10/15/20  8:54 PM  Result Value Ref Range   Glucose-Capillary 303 (H) 70 - 99 mg/dL    Comment: Glucose reference range applies only to samples taken after fasting for at least 8 hours.  Glucose, capillary     Status: Abnormal   Collection Time: 10/15/20  9:53 PM  Result Value Ref Range   Glucose-Capillary 290 (H) 70 - 99 mg/dL    Comment: Glucose reference range applies only to samples taken after fasting for at least 8 hours.  CBC     Status: None   Collection Time: 10/15/20 10:25 PM  Result Value Ref Range   WBC 10.5 4.0 - 10.5 K/uL   RBC 4.44 3.87 - 5.11 MIL/uL   Hemoglobin 12.9 12.0 - 15.0 g/dL   HCT 38.4 66.5 - 99.3 %   MCV 91.0 80.0 - 100.0 fL   MCH 29.1 26.0 - 34.0 pg   MCHC 31.9 30.0 - 36.0 g/dL   RDW 57.0 17.7 - 93.9 %   Platelets 245 150 - 400 K/uL   nRBC 0.0 0.0 - 0.2 %  Comment: Performed at [facility], [address], [place], [place] [address]  Creatinine, serum     Status: None   Collection Time: 10/15/20 10:25 PM   Result Value Ref Range   Creatinine, Ser 0.73 0.44 - 1.00 mg/dL   GFR, Estimated >19 >14 mL/min    Comment: (NOTE) Calculated using the CKD-EPI Creatinine Equation (2021) Performed at [facility], [address] [place], [place] [address]   CBC     Status: None   Collection Time: 10/16/20  6:04 AM  Result Value Ref Range   WBC 9.7 4.0 - 10.5 K/uL   RBC 4.38 3.87 - 5.11 MIL/uL   Hemoglobin 12.6 12.0 - 15.0 g/dL   HCT 62.1 30.8 - 65.7 %   MCV 91.1 80.0 - 100.0 fL   MCH 28.8 26.0 - 34.0 pg   MCHC 31.6 30.0 - 36.0 g/dL   RDW 84.6 96.2 - 95.2 %   Platelets 236 150 - 400 K/uL   nRBC 0.0 0.0 - 0.2 %    Comment: Performed at [facility], [address] [place], [place] [address]  Protime-INR     Status: None   Collection Time: 10/16/20  6:04 AM  Result Value Ref Range   Prothrombin Time 15.1 11.4 - 15.2 seconds   INR 1.2 0.8 - 1.2    Comment: (NOTE) INR goal varies based on device and disease states. Performed at [facility], [address] [place], [place] [address]   APTT     Status: None   Collection Time: 10/16/20  6:04 AM  Result Value Ref Range   aPTT 30 24 - 36 seconds    Comment: Performed at [facility], [address], [place], [place] [address]  Magnesium     Status: None   Collection Time: 10/16/20  6:04 AM  Result Value Ref Range   Magnesium 1.8 1.7 - 2.4 mg/dL    Comment: Performed at [facility], [address] [place], [place] [address]  Phosphorus     Status: Abnormal   Collection Time: 10/16/20  6:04 AM  Result Value Ref Range   Phosphorus 2.3 (L) 2.5 - 4.6 mg/dL    Comment: Performed at [facility], [address] [place], [place] [address]  Comprehensive metabolic panel     Status: Abnormal   Collection Time: 10/16/20  6:04 AM  Result Value Ref Range   Sodium 136 135 - 145 mmol/L   Potassium 4.0 3.5 - 5.1 mmol/L   Chloride 103 98 - 111 mmol/L   CO2 26 22 - 32 mmol/L   Glucose, Bld 304 (H) 70 - 99 mg/dL    Comment: Glucose reference range applies  only to samples taken after fasting for at least 8 hours.   BUN 10 8 - 23 mg/dL   Creatinine, Ser 5.95 0.44 - 1.00 mg/dL   Calcium 8.3 (L) 8.9 - 10.3 mg/dL   Total Protein 6.8 6.5 - 8.1 g/dL   Albumin 2.9 (L) 3.5 - 5.0 g/dL   AST 12 (L) 15 - 41 U/L   ALT 18 0 - 44 U/L   Alkaline Phosphatase 129 (H) 38 - 126 U/L   Total Bilirubin 1.0 0.3 - 1.2 mg/dL   GFR, Estimated >63 >87 mL/min    Comment: (NOTE) Calculated using the CKD-EPI Creatinine Equation (2021)    Anion gap 7 5 - 15    Comment: Performed at [facility], [address] [place], [place] [address]  Glucose, capillary  Status: Abnormal   Collection Time: 10/16/20  8:11 AM  Result Value Ref Range   Glucose-Capillary 300 (H) 70 - 99 mg/dL    Comment: Glucose reference range applies only to samples taken after fasting for at least 8 hours.  Glucose, capillary     Status: Abnormal   Collection Time: 10/16/20 11:22 AM  Result Value Ref Range   Glucose-Capillary 219 (H) 70 - 99 mg/dL    Comment: Glucose reference range applies only to samples taken after fasting for at least 8 hours.    CT Head Wo Contrast  Result Date: 10/15/2020 CLINICAL DATA:  Dizziness, syncope EXAM: CT HEAD WITHOUT CONTRAST TECHNIQUE: Contiguous axial images were obtained from the base of the skull through the vertex without intravenous contrast. COMPARISON:  None. FINDINGS: Brain: No acute intracranial abnormality. Specifically, no hemorrhage, hydrocephalus, mass lesion, acute infarction, or significant intracranial injury. Vascular: No hyperdense vessel or unexpected calcification. Skull: No acute calvarial abnormality. Sinuses/Orbits: No acute findings Other: None IMPRESSION: No acute intracranial abnormality. Electronically Signed   By: Charlett Nose M.D.   On: 10/15/2020 18:59   DG Chest Port 1 View  Result Date: 10/15/2020 CLINICAL DATA:  Shortness of breath EXAM: PORTABLE CHEST 1 VIEW COMPARISON:  April 10, 2020 FINDINGS: There is no edema or airspace  opacity. Heart is mildly enlarged with pulmonary vascularity normal. No adenopathy. There is degenerative change in each shoulder. IMPRESSION: Mild cardiac enlargement.  No edema or airspace opacity. Electronically Signed   By: Bretta Bang III M.D.   On: 10/15/2020 16:44   ECHOCARDIOGRAM COMPLETE  Result Date: 10/16/2020    ECHOCARDIOGRAM REPORT   Patient Name:   KEMARI MARES Date of Exam: 10/16/2020 Medical Rec #:  [id number]    Height:       65.0 in Accession #:    [id number]   Weight:       283.1 lb Date of Birth:  November 15, 1950    BSA:          2.293 m Patient Age:    69 years     BP:           139/70 mmHg Patient Gender: F            HR:           78 bpm. [facility] Procedure: 2D Echo, Cardiac Doppler and Color Doppler Indications:    Syncope R55  History:        Patient has no prior history of Echocardiogram examinations.                 Risk Factors:Hypertension, Dyslipidemia and Diabetes. Abdominal                 wall abscess, Morbid Obesity.  Sonographer:    Celesta Gentile RCS Referring Phys: [id number] OLADAPO ADEFESO IMPRESSIONS  1. Left ventricular ejection fraction, by estimation, is 65 to 70%. The left ventricle has normal function. The left ventricle has no regional wall motion abnormalities. There is mild left ventricular hypertrophy. Left ventricular diastolic parameters are consistent with Grade I diastolic dysfunction (impaired relaxation).  2. Right ventricular systolic function is normal. The right ventricular size is normal. Tricuspid regurgitation signal is inadequate for assessing PA pressure.  3. Left atrial size was moderately dilated.  4. Right atrial size was mildly dilated.  5. The mitral valve is grossly normal. Trivial mitral valve regurgitation.  6. The aortic valve is tricuspid. There is mild calcification of the aortic  valve. Aortic valve regurgitation is not visualized. There is moderate sclerosis to mild aortic valve stenosis. Aortic valve mean gradient measures  12.0 mmHg.  7. The inferior vena cava is normal in size with greater than 50% respiratory variability, suggesting right atrial pressure of 3 mmHg. FINDINGS  Left Ventricle: Left ventricular ejection fraction, by estimation, is 65 to 70%. The left ventricle has normal function. The left ventricle has no regional wall motion abnormalities. Definity contrast agent was given IV to delineate the left ventricular  endocardial borders. The left ventricular internal cavity size was normal in size. There is mild left ventricular hypertrophy. Left ventricular diastolic parameters are consistent with Grade I diastolic dysfunction (impaired relaxation). Right Ventricle: The right ventricular size is normal. No increase in right ventricular wall thickness. Right ventricular systolic function is normal. Tricuspid regurgitation signal is inadequate for assessing PA pressure. Left Atrium: Left atrial size was moderately dilated. Right Atrium: Right atrial size was mildly dilated. Pericardium: There is no evidence of pericardial effusion. Mitral Valve: The mitral valve is grossly normal. There is mild calcification of the mitral valve leaflet(s). Trivial mitral valve regurgitation. Tricuspid Valve: The tricuspid valve is grossly normal. Tricuspid valve regurgitation is trivial. Aortic Valve: The aortic valve is tricuspid. There is mild calcification of the aortic valve. There is mild aortic valve annular calcification. Aortic valve regurgitation is not visualized. Mild aortic stenosis is present. Aortic valve mean gradient measures 12.0 mmHg. Aortic valve peak gradient measures 24.2 mmHg. Aortic valve area, by VTI measures 1.76 cm. Pulmonic Valve: The pulmonic valve was not well visualized. Pulmonic valve regurgitation is trivial. Aorta: The aortic root is normal in size and structure. Venous: The inferior vena cava is normal in size with greater than 50% respiratory variability, suggesting right atrial pressure of 3 mmHg.  IAS/Shunts: No atrial level shunt detected by color flow Doppler.  LEFT VENTRICLE PLAX 2D LVIDd:         4.07 cm  Diastology LVIDs:         2.65 cm  LV e' medial:    7.07 cm/s LV PW:         1.25 cm  LV E/e' medial:  15.0 LV IVS:        1.20 cm  LV e' lateral:   12.50 cm/s LVOT diam:     2.10 cm  LV E/e' lateral: 8.5 LV SV:         102 LV SV Index:   45 LVOT Area:     3.46 cm  RIGHT VENTRICLE RV S prime:     22.60 cm/s TAPSE (M-mode): 3.3 cm LEFT ATRIUM              Index       RIGHT ATRIUM           Index LA diam:        4.50 cm  1.96 cm/m  RA Area:     25.60 cm LA Vol (A2C):   66.5 ml  29.00 ml/m RA Volume:   86.40 ml  37.68 ml/m LA Vol (A4C):   120.0 ml 52.34 ml/m LA Biplane Vol: 93.5 ml  40.78 ml/m  AORTIC VALVE AV Area (Vmax):    1.87 cm AV Area (Vmean):   1.85 cm AV Area (VTI):     1.76 cm AV Vmax:           246.00 cm/s AV Vmean:          161.000 cm/s AV VTI:  0.582 m AV Peak Grad:      24.2 mmHg AV Mean Grad:      12.0 mmHg LVOT Vmax:         133.00 cm/s LVOT Vmean:        85.900 cm/s LVOT VTI:          0.295 m LVOT/AV VTI ratio: 0.51  AORTA Ao Root diam: 3.30 cm MITRAL VALVE MV Area (PHT): 3.08 cm     SHUNTS MV Decel Time: 246 msec     Systemic VTI:  0.30 m MV E velocity: 106.00 cm/s  Systemic Diam: 2.10 cm MV A velocity: 135.00 cm/s MV E/A ratio:  0.79 Nona Dell MD Electronically signed by Nona Dell MD Signature Date/Time: 10/16/2020/12:37:25 PM    Final     Procedure: Incision and drainage of abdominal wall abscess Diagnosis: Abdominal wall abscess, incompletely drained   Description: The wound was cleaned with betadine and infiltrated with 1% lidocaine around the draining area.  A large cruciate incision was made in the area and carried down into the subcutaneous tissue.  Bloody purulent drainage was noted.  Loculations were broken up. Saline was flushed extensively. Gauze packing was placed. ABD and papertape over the gauze.   Assessment & Plan:  Donna Snow is a  70 y.o. female with abdominal wall abscess s/p I&D and packing now. Cultures pending.  -IV antibiotics until cellulitis improved -Cultures pending -BID dressing changes to wound    All questions were answered to the satisfaction of the patient. Updated Dr. Arbutus Leas.   Lucretia Roers 10/16/2020, 2:33 PM

## 2020-10-16 NOTE — Plan of Care (Signed)

## 2020-10-16 NOTE — Progress Notes (Signed)
Lab called this writer , 1 bottle from blood culture contained gram positive cocci. And the other is positive for staph epidermidis and meca -.

## 2020-10-16 NOTE — Progress Notes (Addendum)
PROGRESS NOTE  Donna Snow RXY:[id number] DOB: Nov 19, 1950 DOA: 10/15/2020 PCP: [facility]  Brief History:   70 y.o. female with medical history significant for T2DM, hypertension, hyperlipidemia, GERD, chronic back pain obesity who presents to the emergency department due to an abscess on her abdominal wall.  Patient states that it started out as a small papule about 2 weeks prior to this admission.  She was using warm compresses on it, and it eventually became pustular and subsequently opened and drained purulent material.  She has not able to get to see her PCP secondary to transportation issues.  About 1 week prior to this admission, the patient began having generalized weakness, subjective fevers and chills, and poor p.o. intake.  She began feeling dizzy at that time.  About 2 days prior to this admission, the patient was getting up out of her chair to go to the kitchen when she felt dizzy and subsequently had a syncopal episode.  She denied biting her tongue or bowel or bladder incontinence.  She denied any postictal type symptoms.  At baseline, the patient is able to ambulate with a cane.  She denies any chest pain, but she has been having some dyspnea on exertion.  She denies any hemoptysis but has had a nonproductive cough.  She denies any vomiting, diarrhea, dysuria, hematuria.  ED Course: In the emergency department, febrile with a temperature of 101.44F, RR 20, pulse 95 bpm, BP 135/60, O2 sat 95% on room air.  Work-up in the ED showed normal CBC except for leukocytosis, BMP showed hyponatremia and hyperglycemia.  Albumin 3.3, ALP 147, lactic acid was, urinalysis was positive for glycosuria.  Influenza A, B and SARS coronavirus 2 was negative. CT of head showed no acute intracranial abnormality Chest x-ray showed mild cardiac enlargement.  No edema or airspace opacity noted Incision and drainage was done, IV hydration was provided, she was started on IV antibiotics  and Tylenol was given due to fever.  Hospitalist was asked to admit patient for further evaluation and management.  In the ED, a bedside I&D was attempted.  Assessment/Plan: Sepsis -Present on admission -Presented with tachycardia, leukocytosis, and fever -Secondary to abdominal wall abscess -Lactic acid peaked 1.6 -Follow blood cultures -UA negative for pyuria -Continue IV antibiotics  Abdominal wall abscess -continue vancomycin -Start ceftriaxone -d/c aztreonam -General surgery consult  Uncontrolled diabetes mellitus type 2 with hyperglycemia -07/25/2020 hemoglobin A1c 9.7 -Start Lantus -NovoLog sliding scale  Syncope and collapse -Secondary to vasovagal phenomena -Echocardiogram -Check orthostatics  Hyponatremia -Secondary to volume depletion, poor solute intake, and hyperglycemia -Continue IV fluids  Seizure disorder -Continue Dilantin  Hyperlipidemia -Continue Lipitor  Anxiety/depression -Continue BuSpar and Cymbalta  Essential hypertension -Continue losartan  Status is: Inpatient  Remains inpatient appropriate because:IV treatments appropriate due to intensity of illness or inability to take PO   Dispo: The patient is from: Home              Anticipated d/c is to: Home              Patient currently is not medically stable to d/c.   Difficult to place patient No        Family Communication:  no Family at bedside  Consultants:  General surgery  Code Status:  FULL  DVT Prophylaxis:  [place] Lovenox   Procedures: As Listed in Progress Note Above  Antibiotics: vanc 6/3>> Ceftriaxone 6/4>>    Subjective: Patient denies  fevers, chills, headache, chest pain, dyspnea, nausea, vomiting, diarrhea, abdominal pain, dysuria, hematuria, hematochezia, and melena.   She has nonproductive cough and abd wall pain   Objective: Vitals:   10/15/20 2100 10/16/20 0007 10/16/20 0120 10/16/20 0442  BP: (!) 147/46  (!) 126/53 139/71  Pulse: 82  86 78   Resp: 20  16 18   Temp: 99.2 F (37.3 C) (!) 100.7 F (38.2 C) 100.1 F (37.8 C) 99.4 F (37.4 C)  TempSrc: Oral Oral Oral Oral  SpO2: 97%  97% 97%  Height:        Intake/Output Summary (Last 24 hours) at 10/16/2020 1139 Last data filed at 10/16/2020 0600 Gross per 24 hour  Intake 1649.58 ml  Output 951 ml  Net 698.58 ml   Weight change:  Exam:   General:  Pt is alert, follows commands appropriately, not in acute distress  HEENT: No icterus, No thrush, No neck mass, [place]/AT  Cardiovascular: RRR, S1/S2, no rubs, no gallops  Respiratory: bibasilar crackles. No wheeze  Abdomen: Soft/+BS, non tender, non distended, no guarding  Extremities: trace LE edema, No lymphangitis, No petechiae, No rashes, no synovitis     Data Reviewed: I have personally reviewed following labs and imaging studies Basic Metabolic Panel: Recent Labs  Lab 10/15/20 1557 10/15/20 2225 10/16/20 0604  NA 132*  --  136  K 4.4  --  4.0  CL 100  --  103  CO2 24  --  26  GLUCOSE 375*  --  304*  BUN 19  --  10  CREATININE 0.92 0.73 0.68  CALCIUM 8.5*  --  8.3*  MG  --   --  1.8  PHOS  --   --  2.3*   Liver Function Tests: Recent Labs  Lab 10/15/20 1557 10/16/20 0604  AST 12* 12*  ALT 20 18  ALKPHOS 147* 129*  BILITOT 1.0 1.0  PROT 7.5 6.8  ALBUMIN 3.3* 2.9*   No results for input(s): LIPASE, AMYLASE in the last 168 hours. No results for input(s): AMMONIA in the last 168 hours. Coagulation Profile: Recent Labs  Lab 10/15/20 1557 10/16/20 0604  INR 1.1 1.2   CBC: Recent Labs  Lab 10/15/20 1557 10/15/20 2225 10/16/20 0604  WBC 12.7* 10.5 9.7  NEUTROABS 9.8*  --   --   HGB 13.1 12.9 12.6  HCT 40.5 40.4 39.9  MCV 90.0 91.0 91.1  PLT 248 245 236   Cardiac Enzymes: No results for input(s): CKTOTAL, CKMB, CKMBINDEX, TROPONINI in the last 168 hours. BNP: Invalid input(s): POCBNP CBG: Recent Labs  Lab 10/15/20 2054 10/15/20 2153 10/16/20 0811 10/16/20 1122  GLUCAP 303*  290* 300* 219*   HbA1C: No results for input(s): HGBA1C in the last 72 hours. Urine analysis:    Component Value Date/Time   COLORURINE YELLOW 10/15/2020 1541   APPEARANCEUR CLEAR 10/15/2020 1541   LABSPEC 1.016 10/15/2020 1541   PHURINE 7.0 10/15/2020 1541   GLUCOSEU >=500 (A) 10/15/2020 1541   HGBUR NEGATIVE 10/15/2020 1541   BILIRUBINUR NEGATIVE 10/15/2020 1541   KETONESUR 20 (A) 10/15/2020 1541   PROTEINUR NEGATIVE 10/15/2020 1541   UROBILINOGEN 1.0 11/21/2014 1800   NITRITE NEGATIVE 10/15/2020 1541   LEUKOCYTESUR NEGATIVE 10/15/2020 1541   Sepsis Labs: @LABRCNTIP (procalcitonin:4,lacticidven:4) ) Recent Results (from the past 240 hour(s))  Blood Culture (routine x 2)     Status: None (Preliminary result)   Collection Time: 10/15/20  3:58 PM   Specimen: BLOOD  Result Value Ref Range Status  Specimen Description BLOOD  Final   Special Requests NONE  Final   Culture   Final    NO GROWTH < 24 HOURS Performed at [facility], [address], [place], [place] [address]    Report Status PENDING  Incomplete  Blood Culture (routine x 2)     Status: None (Preliminary result)   Collection Time: 10/15/20  4:07 PM   Specimen: Right Antecubital; Blood  Result Value Ref Range Status   Specimen Description   Final    RIGHT ANTECUBITAL BOTTLES DRAWN AEROBIC AND ANAEROBIC   Special Requests Blood Culture adequate volume  Final   Culture   Final    NO GROWTH < 24 HOURS Performed at [facility], [address], [place], [place] [address]    Report Status PENDING  Incomplete  Resp Panel by RT-PCR (Flu A&B, Covid) Nasopharyngeal Swab     Status: None   Collection Time: 10/15/20  5:17 PM   Specimen: Nasopharyngeal Swab; Nasopharyngeal(NP) swabs in vial transport medium  Result Value Ref Range Status   SARS Coronavirus 2 by RT PCR NEGATIVE NEGATIVE Final    Comment: (NOTE) SARS-CoV-2 target nucleic acids are NOT DETECTED.  The SARS-CoV-2 RNA is generally detectable in upper  respiratory specimens during the acute phase of infection. The lowest concentration of SARS-CoV-2 viral copies this assay can detect is 138 copies/mL. A negative result does not preclude SARS-Cov-2 infection and should not be used as the sole basis for treatment or other patient management decisions. A negative result may occur with  improper specimen collection/handling, submission of specimen other than nasopharyngeal swab, presence of viral mutation(s) within the areas targeted by this assay, and inadequate number of viral copies(<138 copies/mL). A negative result must be combined with clinical observations, patient history, and epidemiological information. The expected result is Negative.  Fact Sheet for Patients:  BloggerCourse.com  Fact Sheet for Healthcare Providers:  SeriousBroker.it  This test is no t yet approved or cleared by the [place] FDA and  has been authorized for detection and/or diagnosis of SARS-CoV-2 by FDA under an Emergency Use Authorization (EUA). This EUA will remain  in effect (meaning this test can be used) for the duration of the COVID-19 declaration under Section 564(b)(1) of the Act, 21 U.S.C.section 360bbb-3(b)(1), unless the authorization is terminated  or revoked sooner.       Influenza A by PCR NEGATIVE NEGATIVE Final   Influenza B by PCR NEGATIVE NEGATIVE Final    Comment: (NOTE) The Xpert Xpress SARS-CoV-2/FLU/RSV plus assay is intended as an aid in the diagnosis of influenza from Nasopharyngeal swab specimens and should not be used as a sole basis for treatment. Nasal washings and aspirates are unacceptable for Xpert Xpress SARS-CoV-2/FLU/RSV testing.  Fact Sheet for Patients: BloggerCourse.com  Fact Sheet for Healthcare Providers: SeriousBroker.it  This test is not yet approved or cleared by the [place] FDA and has been  authorized for detection and/or diagnosis of SARS-CoV-2 by FDA under an Emergency Use Authorization (EUA). This EUA will remain in effect (meaning this test can be used) for the duration of the COVID-19 declaration under Section 564(b)(1) of the Act, 21 U.S.C. section 360bbb-3(b)(1), unless the authorization is terminated or revoked.  Performed at [facility], [address] [place], [place] [address]      Scheduled Meds: . busPIRone  20 mg Oral TID  . DULoxetine  60 mg Oral Daily  . enoxaparin (LOVENOX) injection  40 mg Subcutaneous Q24H  . feeding supplement (GLUCERNA  SHAKE)  237 mL Oral TID BM  . fluticasone furoate-vilanterol  1 puff Inhalation Daily  . insulin aspart  0-20 Units Subcutaneous TID WC  . insulin aspart  0-5 Units Subcutaneous QHS  . losartan  50 mg Oral Daily  . nystatin   Topical BID  . pantoprazole  40 mg Oral Daily  . phenytoin  400 mg Oral Daily  . polyethylene glycol  17 g Oral Daily  . traZODone  50 mg Oral QHS   Continuous Infusions: . aztreonam 1 g (10/16/20 1013)  . lactated ringers 150 mL/hr at 10/16/20 0427  . vancomycin 1,250 mg (10/16/20 0512)    Procedures/Studies: CT Head Wo Contrast  Result Date: 10/15/2020 CLINICAL DATA:  Dizziness, syncope EXAM: CT HEAD WITHOUT CONTRAST TECHNIQUE: Contiguous axial images were obtained from the base of the skull through the vertex without intravenous contrast. COMPARISON:  None. FINDINGS: Brain: No acute intracranial abnormality. Specifically, no hemorrhage, hydrocephalus, mass lesion, acute infarction, or significant intracranial injury. Vascular: No hyperdense vessel or unexpected calcification. Skull: No acute calvarial abnormality. Sinuses/Orbits: No acute findings Other: None IMPRESSION: No acute intracranial abnormality. Electronically Signed   By: Charlett Nose M.D.   On: 10/15/2020 18:59   DG Chest Port 1 View  Result Date: 10/15/2020 CLINICAL DATA:  Shortness of breath EXAM: PORTABLE CHEST 1 VIEW  COMPARISON:  April 10, 2020 FINDINGS: There is no edema or airspace opacity. Heart is mildly enlarged with pulmonary vascularity normal. No adenopathy. There is degenerative change in each shoulder. IMPRESSION: Mild cardiac enlargement.  No edema or airspace opacity. Electronically Signed   By: Bretta Bang III M.D.   On: 10/15/2020 16:44    Catarina Hartshorn, DO  [facility]  If 7PM-7AM, please contact night-coverage [email or web address] Password [email or web address] 10/16/2020, 11:39 AM   LOS: 1 day

## 2020-10-16 NOTE — Progress Notes (Signed)
*  PRELIMINARY RESULTS* Echocardiogram 2D Echocardiogram has been performed with Definity.  Stacey Drain 10/16/2020, 10:51 AM

## 2020-10-17 ENCOUNTER — Inpatient Hospital Stay (HOSPITAL_COMMUNITY): Payer: Medicare HMO

## 2020-10-17 DIAGNOSIS — I480 Paroxysmal atrial fibrillation: Secondary | ICD-10-CM

## 2020-10-17 DIAGNOSIS — I5031 Acute diastolic (congestive) heart failure: Secondary | ICD-10-CM

## 2020-10-17 DIAGNOSIS — R7989 Other specified abnormal findings of blood chemistry: Secondary | ICD-10-CM

## 2020-10-17 DIAGNOSIS — J9601 Acute respiratory failure with hypoxia: Secondary | ICD-10-CM

## 2020-10-17 LAB — BLOOD GAS, ARTERIAL
Acid-base deficit: 0.8 mmol/L (ref 0.0–2.0)
Bicarbonate: 23.8 mmol/L (ref 20.0–28.0)
FIO2: 24
O2 Saturation: 94.9 %
Patient temperature: 38
pCO2 arterial: 39.9 mmHg (ref 32.0–48.0)
pH, Arterial: 7.389 (ref 7.350–7.450)
pO2, Arterial: 76 mmHg — ABNORMAL LOW (ref 83.0–108.0)

## 2020-10-17 LAB — GLUCOSE, CAPILLARY
Glucose-Capillary: 247 mg/dL — ABNORMAL HIGH (ref 70–99)
Glucose-Capillary: 294 mg/dL — ABNORMAL HIGH (ref 70–99)
Glucose-Capillary: 269 mg/dL — ABNORMAL HIGH (ref 70–99)
Glucose-Capillary: 312 mg/dL — ABNORMAL HIGH (ref 70–99)
Glucose-Capillary: 242 mg/dL — ABNORMAL HIGH (ref 70–99)

## 2020-10-17 LAB — CBC
HCT: 36.4 % (ref 36.0–46.0)
Hemoglobin: 11.8 g/dL — ABNORMAL LOW (ref 12.0–15.0)
MCH: 29.1 pg (ref 26.0–34.0)
MCHC: 32.4 g/dL (ref 30.0–36.0)
MCV: 89.7 fL (ref 80.0–100.0)
Platelets: 252 10*3/uL (ref 150–400)
RBC: 4.06 MIL/uL (ref 3.87–5.11)
RDW: 13.2 % (ref 11.5–15.5)
WBC: 10 10*3/uL (ref 4.0–10.5)
nRBC: 0 % (ref 0.0–0.2)

## 2020-10-17 LAB — BASIC METABOLIC PANEL
Anion gap: 9 (ref 5–15)
BUN: 7 mg/dL — ABNORMAL LOW (ref 8–23)
CO2: 24 mmol/L (ref 22–32)
Calcium: 8.3 mg/dL — ABNORMAL LOW (ref 8.9–10.3)
Chloride: 101 mmol/L (ref 98–111)
Creatinine, Ser: 0.64 mg/dL (ref 0.44–1.00)
GFR, Estimated: >60 mL/min (ref >60)
Glucose, Bld: 261 mg/dL — ABNORMAL HIGH (ref 70–99)
Potassium: 3.5 mmol/L (ref 3.5–5.1)
Sodium: 134 mmol/L — ABNORMAL LOW (ref 135–145)

## 2020-10-17 LAB — CULTURE, BLOOD (ROUTINE X 2): Special Requests: ADEQUATE

## 2020-10-17 LAB — LACTIC ACID, PLASMA
Lactic Acid, Venous: 1.1 mmol/L (ref 0.5–1.9)
Lactic Acid, Venous: 1 mmol/L (ref 0.5–1.9)

## 2020-10-17 LAB — TROPONIN I (HIGH SENSITIVITY)
Troponin I (High Sensitivity): 6 ng/L (ref <18)
Troponin I (High Sensitivity): 5 ng/L (ref <18)
Troponin I (High Sensitivity): 6 ng/L (ref <18)
Troponin I (High Sensitivity): 5 ng/L (ref <18)

## 2020-10-17 LAB — MAGNESIUM: Magnesium: 1.8 mg/dL (ref 1.7–2.4)

## 2020-10-17 LAB — BRAIN NATRIURETIC PEPTIDE: B Natriuretic Peptide: 178 pg/mL — ABNORMAL HIGH (ref 0.0–100.0)

## 2020-10-17 LAB — D-DIMER, QUANTITATIVE: D-Dimer, Quant: 0.79 ug/mL-FEU — ABNORMAL HIGH (ref 0.00–0.50)

## 2020-10-17 LAB — MRSA PCR SCREENING: MRSA by PCR: NEGATIVE

## 2020-10-17 MED ORDER — MAGNESIUM SULFATE 2 GM/50ML IV SOLN: 2.0000 g | Freq: Once | INTRAVENOUS | Status: DC

## 2020-10-17 MED ORDER — SIMETHICONE 80 MG PO CHEW
80.0000 mg | CHEWABLE_TABLET | Freq: Four times a day (QID) | ORAL | Status: DC | PRN
  Administered 2020-10-17 – 2020-10-19 (×2): 80 mg via ORAL
  Filled 2020-10-17 (×2): qty 1

## 2020-10-17 MED ORDER — LORAZEPAM 2 MG/ML IJ SOLN: 1.0000 mg | Freq: Once | INTRAMUSCULAR | Status: DC

## 2020-10-17 MED ORDER — INSULIN GLARGINE 100 UNIT/ML ~~LOC~~ SOLN
20.0000 [IU] | Freq: Every day | SUBCUTANEOUS | Status: DC
  Administered 2020-10-17 – 2020-10-19 (×3): 20 [IU] via SUBCUTANEOUS
  Filled 2020-10-17 (×5): qty 0.2

## 2020-10-17 MED ORDER — AMIODARONE HCL IN DEXTROSE 360-4.14 MG/200ML-% IV SOLN
INTRAVENOUS | Status: AC
  Filled 2020-10-17: qty 200

## 2020-10-17 MED ORDER — ORAL CARE MOUTH RINSE
15.0000 mL | Freq: Two times a day (BID) | OROMUCOSAL | Status: DC
  Administered 2020-10-18 – 2020-10-22 (×8): 15 mL via OROMUCOSAL

## 2020-10-17 MED ORDER — LORAZEPAM 2 MG/ML IJ SOLN
INTRAMUSCULAR | Status: AC
  Administered 2020-10-17: 1 mg
  Filled 2020-10-17: qty 1

## 2020-10-17 MED ORDER — FLEET ENEMA 7-19 GM/118ML RE ENEM
1.0000 | ENEMA | Freq: Once | RECTAL | Status: AC
  Administered 2020-10-17: 1 via RECTAL

## 2020-10-17 MED ORDER — POTASSIUM CHLORIDE CRYS ER 10 MEQ PO TBCR
10.0000 meq | EXTENDED_RELEASE_TABLET | Freq: Once | ORAL | Status: AC
  Administered 2020-10-17: 10 meq via ORAL
  Filled 2020-10-17: qty 1

## 2020-10-17 MED ORDER — IOHEXOL 350 MG/ML SOLN: 75.0000 mL | Freq: Once | INTRAVENOUS | Status: DC | PRN

## 2020-10-17 MED ORDER — AMIODARONE HCL IN DEXTROSE 360-4.14 MG/200ML-% IV SOLN
60.0000 mg/h | INTRAVENOUS | Status: AC
  Administered 2020-10-17: 60 mg/h via INTRAVENOUS
  Filled 2020-10-17: qty 200

## 2020-10-17 MED ORDER — ENOXAPARIN SODIUM 150 MG/ML IJ SOSY
130.0000 mg | PREFILLED_SYRINGE | Freq: Two times a day (BID) | INTRAMUSCULAR | Status: DC
  Administered 2020-10-17 – 2020-10-20 (×7): 130 mg via SUBCUTANEOUS
  Filled 2020-10-17: qty 0.86
  Filled 2020-10-17: qty 1
  Filled 2020-10-17: qty 0.86
  Filled 2020-10-17: qty 1
  Filled 2020-10-17 (×2): qty 0.86
  Filled 2020-10-17 (×2): qty 1
  Filled 2020-10-17 (×3): qty 0.86
  Filled 2020-10-17: qty 1

## 2020-10-17 MED ORDER — IOHEXOL 300 MG/ML  SOLN
75.0000 mL | Freq: Once | INTRAMUSCULAR | Status: AC | PRN
  Administered 2020-10-17: 75 mL via INTRAVENOUS

## 2020-10-17 MED ORDER — POTASSIUM CHLORIDE IN NACL 20-0.9 MEQ/L-% IV SOLN: INTRAVENOUS | Status: AC

## 2020-10-17 MED ORDER — AMIODARONE HCL IN DEXTROSE 360-4.14 MG/200ML-% IV SOLN
30.0000 mg/h | INTRAVENOUS | Status: DC
  Administered 2020-10-17: 30 mg/h via INTRAVENOUS
  Filled 2020-10-17 (×4): qty 200

## 2020-10-17 MED ORDER — SIMETHICONE 80 MG PO CHEW
80.0000 mg | CHEWABLE_TABLET | Freq: Once | ORAL | Status: AC
  Administered 2020-10-17: 80 mg via ORAL
  Filled 2020-10-17: qty 1

## 2020-10-17 MED ORDER — CHLORHEXIDINE GLUCONATE CLOTH 2 % EX PADS
6.0000 | MEDICATED_PAD | Freq: Every day | CUTANEOUS | Status: DC
  Administered 2020-10-18 – 2020-10-22 (×4): 6 via TOPICAL

## 2020-10-17 NOTE — Progress Notes (Signed)
ANTICOAGULATION CONSULT NOTE - Initial Consult  Pharmacy Consult for Lovenox Indication: atrial fibrillation  Allergies  Allergen Reactions  . Latex Other (See Comments) and Hives  . Penicillins     Patient Measurements: Height: 5\' 5"  (165.1 cm) IBW/kg (Calculated) : 57  Vital Signs: Temp: 98.9 F (37.2 C) (06/05 0250) Temp Source: Oral (06/05 0250) BP: 118/49 (06/05 0400) Pulse Rate: 94 (06/05 0400)  Labs: Recent Labs    10/15/20 1557 10/15/20 2225 10/16/20 0604 10/17/20 0148 10/17/20 0336  HGB 13.1 12.9 12.6  --  11.8*  HCT 40.5 40.4 39.9  --  36.4  PLT 248 245 236  --  252  APTT 29  --  30  --   --   LABPROT 14.3  --  15.1  --   --   INR 1.1  --  1.2  --   --   CREATININE 0.92 0.73 0.68 0.64  --   TROPONINIHS  --   --   --  6  --     CrCl cannot be calculated (Unknown ideal weight.).   Medical History: Past Medical History:  Diagnosis Date  . Diabetes mellitus   . Hypercholesteremia   . Hypertension   . Obesity     Assessment: 70 y/o F with abdominal wall abscess. Found to be in atrial fibrillation. Starting Lovenox. Hgb 11.8. Plts good. Renal function good. PTA meds reviewed.   Goal of Therapy:  Monitor platelets by anticoagulation protocol: Yes   Plan:  Lovenox 130 mg subcutaneous q12h Daily CBC Monitor for bleeding  74, PharmD, BCPS Clinical Pharmacist Phone: [phone]

## 2020-10-17 NOTE — Progress Notes (Signed)
Telemetry called and to alert of conversion to Afib-RVR >140. Patient maintained in this rhythm from  01:07 until 01:41. RRS was called for SOB and chest pain and new oxygen demand at approximately 01: 30. MD at bedside ordered BNP, Trop, ABG, EKG, 1mg  Ativan. Patient converted back to sinus tachy at 01:47 and MD ordered for patient to receive CT with Contrast.Patient started on Amiodarone drip en-route to radiology.  Patient was transported to CT and transferred to the ICU. Report given to [facility] ICU RN.

## 2020-10-17 NOTE — Progress Notes (Addendum)
1:41AM  RAPID RESPONSE  Rapid response was called due to chest pain, shortness of breath and new oxygen demand.  Patient was reported to have gone into A. fib with RVR at a rate > 140 bpm.  At bedside, patient appears anxious, she was in A. fib with RVR at a rate of 146 bpm on EKG, ABG, troponin, BNP, BMP and lactic acid ordered, CT angiography to rule out PE was ordered, patient was started on IV amiodarone drip and patient was transferred to ICU. Lactic acid was 1.0, Troponin x1-  6, BNP 178, ABG was positive for hypoxia. CT angiography of chest with contrast showed no acute intrathoracic pathology IV Ativan 1 mg was given, therapeutic Lovenox x1 was given with plan to possibly transition patient to DOAC if he does not convert to NSR.  Patient eventually converted to sinus tachycardia   Physical Exam  Gen:- Awake Alert, and oriented x3 HEENT:- [place].AT, No sclera icterus Neck-Supple Neck,No JVD,.  Lungs-  CTAB  CV- S1, S2 normal Abd-  +ve B.Sounds, Abd Soft, No tenderness,    Extremity/Skin:-Trace edema bilaterally in lower extremities Psych-affect is appropriate, oriented x3 Neuro-no new focal deficits, no tremors  Assessment and plan Paroxysmal atrial fibrillation Patient was started on IV amiodarone drip Therapeutic Lovenox was given Patient was transferred to ICU for closer monitoring  Elevated BNP due to diastolic CHF BNP 740, patient does not appear fluid overloaded EKG done on 6/4 showed LVEF of 65 to 70%, LV has no RWMA.  LV diastolic parameters are consistent with grade 1 diastolic dysfunction (impaired relaxation) Continue total input/output, daily weights and fluid restriction Continue Cardiac diet   Acute respiratory failure with hypoxia ABG showed pH 7.389, PCO2 39.9, PO2 76, bicarb 23.8 Cause of hypoxia unknown at this time, patient may have OSA/OHS Continue supplemental oxygen and wean as tolerated   Critical time: 37 minutes   Critical care personally provided   managing the patient due to high probability of clinically significant and life threatening deterioration. This critical care time included obtaining a history; examining the patient, pulse oximetry; ordering and review of studies; arranging urgent treatment with development of a management plan; evaluation of patient's response of treatment; frequent reassessment; and discussions with other providers.  This critical care time was performed to assess and manage the high probability of imminent and life threatening deterioration that could result in multi-organ failure.

## 2020-10-17 NOTE — Progress Notes (Addendum)
PROGRESS NOTE  Donna Snow GUR:[id number] DOB: Dec 08, 1950 DOA: 10/15/2020 PCP: [facility]   Brief History:  70 y.o.femalewith medical history significant forT2DM, hypertension, hyperlipidemia, GERD, chronic back pain obesity who presents to the emergency department due to an abscess on her abdominal wall.  Patient states that it started out as a small papule about 2 weeks prior to this admission.  She was using warm compresses on it, and it eventually became pustular and subsequently opened and drained purulent material.  She has not able to get to see her PCP secondary to transportation issues.  About 1 week prior to this admission, the patient began having generalized weakness, subjective fevers and chills, and poor p.o. intake.  She began feeling dizzy at that time.  About 2 days prior to this admission, the patient was getting up out of her chair to go to the kitchen when she felt dizzy and subsequently had a syncopal episode.  She denied biting her tongue or bowel or bladder incontinence.  She denied any postictal type symptoms.  At baseline, the patient is able to ambulate with a cane.  She denies any chest pain, but she has been having some dyspnea on exertion.  She denies any hemoptysis but has had a nonproductive cough.  She denies any vomiting, diarrhea, dysuria, hematuria.  ED Course: In the emergency department, febrile with a temperature of 101.63F, RR20, pulse 95 bpm, BP 135/60, O2 sat 95% on room air. Work-up in the ED showed normal CBC except for leukocytosis, BMP showed hyponatremia and hyperglycemia. Albumin 3.3, ALP 147, lactic acid was, urinalysis was positive for glycosuria. Influenza A, B and SARS coronavirus 2 was negative. CT of head showed no acute intracranial abnormality Chest x-ray showed mild cardiac enlargement. No edema or airspace opacity noted Incision and drainage was done, IV hydration was provided, she was started on IV antibiotics  and Tylenol was given due to fever. Hospitalist was asked to admit patient for further evaluation and management.  In the ED, a bedside I&D was attempted.  General surgery was consulted to assist.  On the evening of 10/16/20, patient developed cp, sob with afib RVR.  She was initially started on amiodarone drip and moved to SDU  Assessment/Plan: Sepsis -Present on admission -Presented with tachycardia, leukocytosis, and fever -Secondary to abdominal wall abscess -Lactic acid peaked 1.6 -Follow blood cultures--suggests contaminant -UA negative for pyuria -Continue IV antibiotics  Abdominal wall abscess -continue vancomycin -Started ceftriaxone -d/c aztreonam -General surgery consult appreciated  Atrial fibrillation with RVR, paroxysmal -now back to NSR -initially started on amio>>discontinue -if recurrs, may need long term AC -CHADSVASc = 5 (HTN, age, DM, ASVD, female)  Uncontrolled diabetes mellitus type 2 with hyperglycemia -07/25/2020 hemoglobin A1c 9.7 -Start Lantus--increase to 20 units -NovoLog sliding scale  Syncope and collapse -Secondary to vasovagal phenomena -10/16/20 Echo EF 65-70%, no WMA, normal RV, mild AS -Check orthostatics  Hyponatremia -Secondary to volume depletion, poor solute intake, and hyperglycemia -Continue IV fluids  Seizure disorder -Continue Dilantin  Hyperlipidemia -Continue Lipitor  Anxiety/depression -Continue BuSpar and Cymbalta  Essential hypertension -hold losartan to allow BP margin for chronotropic meds  Status is: Inpatient  Remains inpatient appropriate because:IV treatments appropriate due to intensity of illness or inability to take PO   Dispo: The patient is from: Home  Anticipated d/c is to: Home  Patient currently is not medically stable to d/c.  Difficult to place patient No        Family Communication:  no Family at bedside  Consultants:  General  surgery  Code Status:  FULL  DVT Prophylaxis:  [place] Lovenox   Procedures: As Listed in Progress Note Above  Antibiotics: vanc 6/3>> Ceftriaxone 6/4>>   Subjective:  Patient complains of sob.  Denies cp, n/v/d.  Has abd wall pain where infection is.  Denies headache, cough, hemoptysis Objective: Vitals:   10/17/20 0737 10/17/20 0800 10/17/20 0900 10/17/20 1000  BP:  (!) 143/60 (!) 124/46 (!) 129/51  Pulse: 76 79 87 75  Resp: 16 20 18 20   Temp: 98.5 F (36.9 C)     TempSrc: Oral     SpO2: 99% 100% 99% 92%  Height:        Intake/Output Summary (Last 24 hours) at 10/17/2020 1038 Last data filed at 10/17/2020 0900 Gross per 24 hour  Intake 2274.93 ml  Output 1750 ml  Net 524.93 ml   Weight change:  Exam:   General:  Pt is alert, follows commands appropriately, not in acute distress  HEENT: No icterus, No thrush, No neck mass, [place]/AT  Cardiovascular: RRR, S1/S2, no rubs, no gallops  Respiratory: bibasilar rales. No wheeze  Abdomen: Soft/+BS, non tender, non distended, no guarding  Extremities: No edema, No lymphangitis, No petechiae, No rashes, no synovitis   Data Reviewed: I have personally reviewed following labs and imaging studies Basic Metabolic Panel: Recent Labs  Lab 10/15/20 1557 10/15/20 2225 10/16/20 0604 10/17/20 0148  NA 132*  --  136 134*  K 4.4  --  4.0 3.5  CL 100  --  103 101  CO2 24  --  26 24  GLUCOSE 375*  --  304* 261*  BUN 19  --  10 7*  CREATININE 0.92 0.73 0.68 0.64  CALCIUM 8.5*  --  8.3* 8.3*  MG  --   --  1.8  --   PHOS  --   --  2.3*  --    Liver Function Tests: Recent Labs  Lab 10/15/20 1557 10/16/20 0604  AST 12* 12*  ALT 20 18  ALKPHOS 147* 129*  BILITOT 1.0 1.0  PROT 7.5 6.8  ALBUMIN 3.3* 2.9*   No results for input(s): LIPASE, AMYLASE in the last 168 hours. No results for input(s): AMMONIA in the last 168 hours. Coagulation Profile: Recent Labs  Lab 10/15/20 1557 10/16/20 0604  INR 1.1 1.2    CBC: Recent Labs  Lab 10/15/20 1557 10/15/20 2225 10/16/20 0604 10/17/20 0336  WBC 12.7* 10.5 9.7 10.0  NEUTROABS 9.8*  --   --   --   HGB 13.1 12.9 12.6 11.8*  HCT 40.5 40.4 39.9 36.4  MCV 90.0 91.0 91.1 89.7  PLT 248 245 236 252   Cardiac Enzymes: No results for input(s): CKTOTAL, CKMB, CKMBINDEX, TROPONINI in the last 168 hours. BNP: Invalid input(s): POCBNP CBG: Recent Labs  Lab 10/16/20 1122 10/16/20 1620 10/16/20 2036 10/17/20 0130 10/17/20 0739  GLUCAP 219* 239* 265* 247* 294*   HbA1C: No results for input(s): HGBA1C in the last 72 hours. Urine analysis:    Component Value Date/Time   COLORURINE YELLOW 10/15/2020 1541   APPEARANCEUR CLEAR 10/15/2020 1541   LABSPEC 1.016 10/15/2020 1541   PHURINE 7.0 10/15/2020 1541   GLUCOSEU >=500 (A) 10/15/2020 1541   HGBUR NEGATIVE 10/15/2020 1541   BILIRUBINUR NEGATIVE 10/15/2020 1541   KETONESUR 20 (A) 10/15/2020 1541   PROTEINUR NEGATIVE 10/15/2020 1541  UROBILINOGEN 1.0 11/21/2014 1800   NITRITE NEGATIVE 10/15/2020 1541   LEUKOCYTESUR NEGATIVE 10/15/2020 1541   Sepsis Labs: (procalcitonin:4,lacticidven:4) ) Recent Results (from the past 240 hour(s))  Urine culture     Status: None (Preliminary result)   Collection Time: 10/15/20  3:41 PM   Specimen: In/Out Cath Urine  Result Value Ref Range Status   Specimen Description   Final    IN/OUT CATH URINE Performed at [facility], [address] [place], [place] [address]    Special Requests   Final    NONE Performed at [facility], [address] [place], [place] [address]    Culture   Final    CULTURE REINCUBATED FOR BETTER GROWTH Performed at [facility], [address], [place], [place] [address]    Report Status PENDING  Incomplete  Blood Culture (routine x 2)     Status: None (Preliminary result)   Collection Time: 10/15/20  3:58 PM   Specimen: BLOOD  Result Value Ref Range Status   Specimen Description   Final     BLOOD Performed at [facility], [address] [place], [place] [address]    Special Requests   Final    NONE Performed at [facility], [address], [place], [place] [address]    Culture  Setup Time   Final    GRAM POSITIVE COCCI AEROBIC BOTTLE ONLY Gram Stain Report Called to,Read Back By and Verified With: COLEMAN @ 1309 ON [id number] BY HENDERSON L. CRITICAL RESULT CALLED TO, READ BACK BY AND VERIFIED WITH: Meryl Dare RN  10/16/20 EB Performed at [facility], [address] [place], [place] [address]    Culture GRAM POSITIVE COCCI  Final   Report Status PENDING  Incomplete  Blood Culture ID Panel (Reflexed)     Status: Abnormal   Collection Time: 10/15/20  3:58 PM  Result Value Ref Range Status   Enterococcus faecalis NOT DETECTED NOT DETECTED Final   Enterococcus Faecium NOT DETECTED NOT DETECTED Final   Listeria monocytogenes NOT DETECTED NOT DETECTED Final   Staphylococcus species DETECTED (A) NOT DETECTED Final    Comment: CRITICAL RESULT CALLED TO, READ BACK BY AND VERIFIED WITH: Meryl Dare RN  10/16/20 EB    Staphylococcus aureus (BCID) NOT DETECTED NOT DETECTED Final   Staphylococcus epidermidis DETECTED (A) NOT DETECTED Final    Comment: CRITICAL RESULT CALLED TO, READ BACK BY AND VERIFIED WITH: Meryl Dare RN  10/16/20 EB    Staphylococcus lugdunensis NOT DETECTED NOT DETECTED Final   Streptococcus species NOT DETECTED NOT DETECTED Final   Streptococcus agalactiae NOT DETECTED NOT DETECTED Final   Streptococcus pneumoniae NOT DETECTED NOT DETECTED Final   Streptococcus pyogenes NOT DETECTED NOT DETECTED Final   A.calcoaceticus-baumannii NOT DETECTED NOT DETECTED Final   Bacteroides fragilis NOT DETECTED NOT DETECTED Final   Enterobacterales NOT DETECTED NOT DETECTED Final   Enterobacter cloacae complex NOT DETECTED NOT DETECTED Final   Escherichia coli NOT DETECTED NOT DETECTED Final   Klebsiella aerogenes NOT DETECTED NOT DETECTED  Final   Klebsiella oxytoca NOT DETECTED NOT DETECTED Final   Klebsiella pneumoniae NOT DETECTED NOT DETECTED Final   Proteus species NOT DETECTED NOT DETECTED Final   Salmonella species NOT DETECTED NOT DETECTED Final   Serratia marcescens NOT DETECTED NOT DETECTED Final   Haemophilus influenzae NOT DETECTED NOT DETECTED Final   Neisseria meningitidis NOT DETECTED NOT DETECTED Final   Pseudomonas aeruginosa NOT DETECTED NOT DETECTED Final   Stenotrophomonas  maltophilia NOT DETECTED NOT DETECTED Final   Candida albicans NOT DETECTED NOT DETECTED Final   Candida auris NOT DETECTED NOT DETECTED Final   Candida glabrata NOT DETECTED NOT DETECTED Final   Candida krusei NOT DETECTED NOT DETECTED Final   Candida parapsilosis NOT DETECTED NOT DETECTED Final   Candida tropicalis NOT DETECTED NOT DETECTED Final   Cryptococcus neoformans/gattii NOT DETECTED NOT DETECTED Final   Methicillin resistance mecA/C NOT DETECTED NOT DETECTED Final    Comment: Performed at [facility], [address] [place], [place] [address]  Blood Culture (routine x 2)     Status: None (Preliminary result)   Collection Time: 10/15/20  4:07 PM   Specimen: Right Antecubital; Blood  Result Value Ref Range Status   Specimen Description   Final    RIGHT ANTECUBITAL BOTTLES DRAWN AEROBIC AND ANAEROBIC   Special Requests Blood Culture adequate volume  Final   Culture   Final    NO GROWTH < 24 HOURS Performed at [facility], [address], [place], [place] [address]    Report Status PENDING  Incomplete  Resp Panel by RT-PCR (Flu A&B, Covid) Nasopharyngeal Swab     Status: None   Collection Time: 10/15/20  5:17 PM   Specimen: Nasopharyngeal Swab; Nasopharyngeal(NP) swabs in vial transport medium  Result Value Ref Range Status   SARS Coronavirus 2 by RT PCR NEGATIVE NEGATIVE Final    Comment: (NOTE) SARS-CoV-2 target nucleic acids are NOT DETECTED.  The SARS-CoV-2 RNA is generally detectable in upper  respiratory specimens during the acute phase of infection. The lowest concentration of SARS-CoV-2 viral copies this assay can detect is 138 copies/mL. A negative result does not preclude SARS-Cov-2 infection and should not be used as the sole basis for treatment or other patient management decisions. A negative result may occur with  improper specimen collection/handling, submission of specimen other than nasopharyngeal swab, presence of viral mutation(s) within the areas targeted by this assay, and inadequate number of viral copies(<138 copies/mL). A negative result must be combined with clinical observations, patient history, and epidemiological information. The expected result is Negative.  Fact Sheet for Patients:  BloggerCourse.com  Fact Sheet for Healthcare Providers:  SeriousBroker.it  This test is no t yet approved or cleared by the [place] FDA and  has been authorized for detection and/or diagnosis of SARS-CoV-2 by FDA under an Emergency Use Authorization (EUA). This EUA will remain  in effect (meaning this test can be used) for the duration of the COVID-19 declaration under Section 564(b)(1) of the Act, 21 U.S.C.section 360bbb-3(b)(1), unless the authorization is terminated  or revoked sooner.       Influenza A by PCR NEGATIVE NEGATIVE Final   Influenza B by PCR NEGATIVE NEGATIVE Final    Comment: (NOTE) The Xpert Xpress SARS-CoV-2/FLU/RSV plus assay is intended as an aid in the diagnosis of influenza from Nasopharyngeal swab specimens and should not be used as a sole basis for treatment. Nasal washings and aspirates are unacceptable for Xpert Xpress SARS-CoV-2/FLU/RSV testing.  Fact Sheet for Patients: BloggerCourse.com  Fact Sheet for Healthcare Providers: SeriousBroker.it  This test is not yet approved or cleared by the [place] FDA and has been  authorized for detection and/or diagnosis of SARS-CoV-2 by FDA under an Emergency Use Authorization (EUA). This EUA will remain in effect (meaning this test can be used) for the duration of the COVID-19 declaration under Section 564(b)(1) of the Act, 21 U.S.C. section 360bbb-3(b)(1), unless the authorization is terminated  or revoked.  Performed at [facility], [address] [place], [place] [address]   MRSA PCR Screening     Status: None   Collection Time: 10/17/20  2:40 AM   Specimen: Nasal Mucosa; Nasopharyngeal  Result Value Ref Range Status   MRSA by PCR NEGATIVE NEGATIVE Final    Comment:        The GeneXpert MRSA Assay (FDA approved for NASAL specimens only), is one component of a comprehensive MRSA colonization surveillance program. It is not intended to diagnose MRSA infection nor to guide or monitor treatment for MRSA infections. Performed at [facility], [address] [place], [place] [address]      Scheduled Meds: . busPIRone  20 mg Oral TID  . Chlorhexidine Gluconate Cloth  6 each Topical Daily  . DULoxetine  60 mg Oral Daily  . enoxaparin (LOVENOX) injection  130 mg Subcutaneous Q12H  . feeding supplement (GLUCERNA SHAKE)  237 mL Oral TID BM  . fluticasone furoate-vilanterol  1 puff Inhalation Daily  . insulin aspart  0-20 Units Subcutaneous TID WC  . insulin aspart  0-5 Units Subcutaneous QHS  . insulin glargine  20 Units Subcutaneous Daily  . lidocaine (PF)  10 mL Intradermal Once  . LORazepam  1 mg Intravenous Once  . [START ON 10/18/2020] mouth rinse  15 mL Mouth Rinse BID  . nystatin   Topical BID  . pantoprazole  40 mg Oral Daily  . phenytoin  400 mg Oral Daily  . polyethylene glycol  17 g Oral Daily  . traZODone  50 mg Oral QHS   Continuous Infusions: . amiodarone 30 mg/hr (10/17/20 0741)  . cefTRIAXone (ROCEPHIN)  IV 2 g (10/16/20 1753)  . vancomycin 1,250 mg (10/17/20 0538)    Procedures/Studies: CT Head Wo Contrast  Result Date:  10/15/2020 CLINICAL DATA:  Dizziness, syncope EXAM: CT HEAD WITHOUT CONTRAST TECHNIQUE: Contiguous axial images were obtained from the base of the skull through the vertex without intravenous contrast. COMPARISON:  None. FINDINGS: Brain: No acute intracranial abnormality. Specifically, no hemorrhage, hydrocephalus, mass lesion, acute infarction, or significant intracranial injury. Vascular: No hyperdense vessel or unexpected calcification. Skull: No acute calvarial abnormality. Sinuses/Orbits: No acute findings Other: None IMPRESSION: No acute intracranial abnormality. Electronically Signed   By: Charlett Nose M.D.   On: 10/15/2020 18:59   CT CHEST W CONTRAST  Result Date: 10/17/2020 CLINICAL DATA:  Sepsis, atrial fibrillation with rapid ventricular response EXAM: CT CHEST WITH CONTRAST TECHNIQUE: Multidetector CT imaging of the chest was performed during intravenous contrast administration. CONTRAST:  75mL OMNIPAQUE IOHEXOL 300 MG/ML  SOLN COMPARISON:  None. FINDINGS: Cardiovascular: Cardiac size within normal limits. Extensive multi-vessel coronary artery calcification, largely within the left anterior descending coronary artery. No pericardial effusion. The central pulmonary arteries are of normal caliber. 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 No aortic aneurysm. Mild atherosclerotic calcification within the descending thoracic aorta. Mediastinum/Nodes: The visualized thyroid is unremarkable. No pathologic thoracic adenopathy. Small hiatal hernia. Esophagus is unremarkable. Lungs/Pleura: Lungs are clear. No pleural effusion or pneumothorax. The central airways are widely patent. Upper Abdomen: Status post cholecystectomy.  No acute abnormality. Musculoskeletal: The osseous structures are age-appropriate. No acute bone abnormality. No lytic or blastic bone lesion. IMPRESSION: No acute intrathoracic pathology identified. No definite radiographic explanation for the patient's reported symptoms.  Extensive coronary artery calcification. Small hiatal hernia. Aortic Atherosclerosis (ICD10-I70.0). Electronically Signed   By: Helyn Numbers MD   On: 10/17/2020 02:51   DG Chest [facility]  1 View  Result Date: 10/15/2020 CLINICAL DATA:  Shortness of breath EXAM: PORTABLE CHEST 1 VIEW COMPARISON:  April 10, 2020 FINDINGS: There is no edema or airspace opacity. Heart is mildly enlarged with pulmonary vascularity normal. No adenopathy. There is degenerative change in each shoulder. IMPRESSION: Mild cardiac enlargement.  No edema or airspace opacity. Electronically Signed   By: Bretta BangWilliam  Woodruff III M.D.   On: 10/15/2020 16:44   ECHOCARDIOGRAM COMPLETE  Result Date: 10/16/2020    ECHOCARDIOGRAM REPORT   Patient Name:   Marisue IvanERRY Lange Date of Exam: 10/16/2020 Medical Rec #:  [id number]    Height:       65.0 in Accession #:    [id number]   Weight:       283.1 lb Date of Birth:  02/16/1951    BSA:          2.293 m Patient Age:    69 years     BP:           139/70 mmHg Patient Gender: F            HR:           78 bpm. [facility] Procedure: 2D Echo, Cardiac Doppler and Color Doppler Indications:    Syncope R55  History:        Patient has no prior history of Echocardiogram examinations.                 Risk Factors:Hypertension, Dyslipidemia and Diabetes. Abdominal                 wall abscess, Morbid Obesity.  Sonographer:    Celesta GentileBernard White RCS Referring Phys: [id number] OLADAPO ADEFESO IMPRESSIONS  1. Left ventricular ejection fraction, by estimation, is 65 to 70%. The left ventricle has normal function. The left ventricle has no regional wall motion abnormalities. There is mild left ventricular hypertrophy. Left ventricular diastolic parameters are consistent with Grade I diastolic dysfunction (impaired relaxation).  2. Right ventricular systolic function is normal. The right ventricular size is normal. Tricuspid regurgitation signal is inadequate for assessing PA pressure.  3. Left atrial size was moderately  dilated.  4. Right atrial size was mildly dilated.  5. The mitral valve is grossly normal. Trivial mitral valve regurgitation.  6. The aortic valve is tricuspid. There is mild calcification of the aortic valve. Aortic valve regurgitation is not visualized. There is moderate sclerosis to mild aortic valve stenosis. Aortic valve mean gradient measures 12.0 mmHg.  7. The inferior vena cava is normal in size with greater than 50% respiratory variability, suggesting right atrial pressure of 3 mmHg. FINDINGS  Left Ventricle: Left ventricular ejection fraction, by estimation, is 65 to 70%. The left ventricle has normal function. The left ventricle has no regional wall motion abnormalities. Definity contrast agent was given IV to delineate the left ventricular  endocardial borders. The left ventricular internal cavity size was normal in size. There is mild left ventricular hypertrophy. Left ventricular diastolic parameters are consistent with Grade I diastolic dysfunction (impaired relaxation). Right Ventricle: The right ventricular size is normal. No increase in right ventricular wall thickness. Right ventricular systolic function is normal. Tricuspid regurgitation signal is inadequate for assessing PA pressure. Left Atrium: Left atrial size was moderately dilated. Right Atrium: Right atrial size was mildly dilated. Pericardium: There is no evidence of pericardial effusion. Mitral Valve: The mitral valve is grossly normal. There is mild calcification of the mitral valve leaflet(s). Trivial mitral valve regurgitation. Tricuspid  Valve: The tricuspid valve is grossly normal. Tricuspid valve regurgitation is trivial. Aortic Valve: The aortic valve is tricuspid. There is mild calcification of the aortic valve. There is mild aortic valve annular calcification. Aortic valve regurgitation is not visualized. Mild aortic stenosis is present. Aortic valve mean gradient measures 12.0 mmHg. Aortic valve peak gradient measures 24.2  mmHg. Aortic valve area, by VTI measures 1.76 cm. Pulmonic Valve: The pulmonic valve was not well visualized. Pulmonic valve regurgitation is trivial. Aorta: The aortic root is normal in size and structure. Venous: The inferior vena cava is normal in size with greater than 50% respiratory variability, suggesting right atrial pressure of 3 mmHg. IAS/Shunts: No atrial level shunt detected by color flow Doppler.  LEFT VENTRICLE PLAX 2D LVIDd:         4.07 cm  Diastology LVIDs:         2.65 cm  LV e' medial:    7.07 cm/s LV PW:         1.25 cm  LV E/e' medial:  15.0 LV IVS:        1.20 cm  LV e' lateral:   12.50 cm/s LVOT diam:     2.10 cm  LV E/e' lateral: 8.5 LV SV:         102 LV SV Index:   45 LVOT Area:     3.46 cm  RIGHT VENTRICLE RV S prime:     22.60 cm/s TAPSE (M-mode): 3.3 cm LEFT ATRIUM              Index       RIGHT ATRIUM           Index LA diam:        4.50 cm  1.96 cm/m  RA Area:     25.60 cm LA Vol (A2C):   66.5 ml  29.00 ml/m RA Volume:   86.40 ml  37.68 ml/m LA Vol (A4C):   120.0 ml 52.34 ml/m LA Biplane Vol: 93.5 ml  40.78 ml/m  AORTIC VALVE AV Area (Vmax):    1.87 cm AV Area (Vmean):   1.85 cm AV Area (VTI):     1.76 cm AV Vmax:           246.00 cm/s AV Vmean:          161.000 cm/s AV VTI:            0.582 m AV Peak Grad:      24.2 mmHg AV Mean Grad:      12.0 mmHg LVOT Vmax:         133.00 cm/s LVOT Vmean:        85.900 cm/s LVOT VTI:          0.295 m LVOT/AV VTI ratio: 0.51  AORTA Ao Root diam: 3.30 cm MITRAL VALVE MV Area (PHT): 3.08 cm     SHUNTS MV Decel Time: 246 msec     Systemic VTI:  0.30 m MV E velocity: 106.00 cm/s  Systemic Diam: 2.10 cm MV A velocity: 135.00 cm/s MV E/A ratio:  0.79 Nona Dell MD Electronically signed by Nona Dell MD Signature Date/Time: 10/16/2020/12:37:25 PM    Final     Catarina Hartshorn, DO  [facility]  If 7PM-7AM, please contact night-coverage [email or web address] Password [email or web address] 10/17/2020, 10:38 AM   LOS: 2 days

## 2020-10-17 NOTE — Plan of Care (Signed)

## 2020-10-18 ENCOUNTER — Encounter (HOSPITAL_COMMUNITY): Payer: Self-pay | Admitting: Internal Medicine

## 2020-10-18 ENCOUNTER — Inpatient Hospital Stay (HOSPITAL_COMMUNITY): Payer: Medicare HMO

## 2020-10-18 LAB — BASIC METABOLIC PANEL
Anion gap: 8 (ref 5–15)
BUN: 7 mg/dL — ABNORMAL LOW (ref 8–23)
CO2: 27 mmol/L (ref 22–32)
Calcium: 8.1 mg/dL — ABNORMAL LOW (ref 8.9–10.3)
Chloride: 103 mmol/L (ref 98–111)
Creatinine, Ser: 0.6 mg/dL (ref 0.44–1.00)
GFR, Estimated: >60 mL/min (ref >60)
Glucose, Bld: 206 mg/dL — ABNORMAL HIGH (ref 70–99)
Potassium: 3.5 mmol/L (ref 3.5–5.1)
Sodium: 138 mmol/L (ref 135–145)

## 2020-10-18 LAB — URINE CULTURE

## 2020-10-18 LAB — CBC
HCT: 37.2 % (ref 36.0–46.0)
Hemoglobin: 11.8 g/dL — ABNORMAL LOW (ref 12.0–15.0)
MCH: 28.5 pg (ref 26.0–34.0)
MCHC: 31.7 g/dL (ref 30.0–36.0)
MCV: 89.9 fL (ref 80.0–100.0)
Platelets: 295 10*3/uL (ref 150–400)
RBC: 4.14 MIL/uL (ref 3.87–5.11)
RDW: 13 % (ref 11.5–15.5)
WBC: 8.6 10*3/uL (ref 4.0–10.5)
nRBC: 0 % (ref 0.0–0.2)

## 2020-10-18 LAB — HEMOGLOBIN A1C
Hgb A1c MFr Bld: 9.7 % — ABNORMAL HIGH (ref 4.8–5.6)
Mean Plasma Glucose: 232 mg/dL

## 2020-10-18 LAB — GLUCOSE, CAPILLARY
Glucose-Capillary: 183 mg/dL — ABNORMAL HIGH (ref 70–99)
Glucose-Capillary: 240 mg/dL — ABNORMAL HIGH (ref 70–99)
Glucose-Capillary: 211 mg/dL — ABNORMAL HIGH (ref 70–99)

## 2020-10-18 LAB — MAGNESIUM: Magnesium: 1.7 mg/dL (ref 1.7–2.4)

## 2020-10-18 MED ORDER — MAGNESIUM SULFATE 2 GM/50ML IV SOLN
2.0000 g | Freq: Once | INTRAVENOUS | Status: AC
  Administered 2020-10-18: 2 g via INTRAVENOUS
  Filled 2020-10-18: qty 50

## 2020-10-18 MED ORDER — IOHEXOL 300 MG/ML  SOLN
100.0000 mL | Freq: Once | INTRAMUSCULAR | Status: AC | PRN
  Administered 2020-10-18: 100 mL via INTRAVENOUS

## 2020-10-18 MED ORDER — INSULIN ASPART 100 UNIT/ML IJ SOLN
4.0000 [IU] | Freq: Three times a day (TID) | INTRAMUSCULAR | Status: DC
  Administered 2020-10-18 – 2020-10-19 (×4): 4 [IU] via SUBCUTANEOUS

## 2020-10-18 NOTE — Progress Notes (Signed)
[facility]  Packing changed. Drainage with foul smell and bloody.  Cellulitis still present on the medial aspect of abdomen to the incision and drainage site. May have to do a Ct abd to ensure no fluid collections continue to be present.   Algis Greenhouse, MD [facility] [address] [place], [place] [address] [phone] (office)

## 2020-10-18 NOTE — Progress Notes (Signed)
Pharmacy Antibiotic Note  Donna Snow is a 70 y.o. female admitted on 10/15/2020 with cellulitis.  Pharmacy has been consulted for Vancomycin dosing.   Plan: Continue vancomycin 1250mg  IV q12hrs  Monitor labs, c/s, and vanco level as indicated.  Last recorded weight for pt was 149Kg, SCr 0.92 Height: 5\' 5"  (165.1 cm) Weight: (!) 148.6 kg (327 lb 9.7 oz) IBW/kg (Calculated) : 57  Temp (24hrs), Avg:98.8 F (37.1 C), Min:98.6 F (37 C), Max:99 F (37.2 C)  Recent Labs  Lab 10/15/20 1557 10/15/20 1919 10/15/20 2225 10/16/20 0604 10/17/20 0148 10/17/20 0150 10/17/20 0336 10/18/20 0433  WBC 12.7*  --  10.5 9.7  --   --  10.0 8.6  CREATININE 0.92  --  0.73 0.68 0.64  --   --  0.60  LATICACIDVEN 1.3 1.6  --   --   --  1.1 1.0  --     Estimated Creatinine Clearance: 99.5 mL/min (by C-G formula based on SCr of 0.6 mg/dL).    Allergies  Allergen Reactions  . Latex Other (See Comments) and Hives  . Penicillins     Antimicrobials this admission: Aztreonam 6/3 >>6/4 CTX 6/4 >> Vanco 6/3 >>  6/3 Bcx: 1 bottle- staph epi- contaminant  6/3 Ucx: multiple  MRSA PCR neg  Thank you for allowing pharmacy to be a part of this patient's care.  8/3 10/18/2020 10:29 AM

## 2020-10-18 NOTE — Progress Notes (Signed)
PROGRESS NOTE  Donna Snow ZOX:[id number] DOB: 15-Dec-1950 DOA: 10/15/2020 PCP: [facility]  Brief History:   70 y.o.femalewith medical history significant forT2DM, hypertension, hyperlipidemia, GERD, chronic back pain obesity who presents to the emergency department due to an abscesson her abdominal wall.Patient states that it started out as a small papule about 2 weeks prior to this admission. She was using warm compresses on it, and it eventually became pustular and subsequently opened and drained purulent material. She has not able to get to see her PCP secondary to transportation issues. About 1 week prior to this admission, the patient began having generalized weakness, subjective fevers and chills, and poor p.o. intake. She began feeling dizzy at that time. About 2 days prior to this admission, the patient was getting up out of her chair to go to the kitchen when she felt dizzy and subsequently had a syncopal episode. She denied biting her tongue or bowel or bladder incontinence. She denied any postictal type symptoms. At baseline, the patient is able to ambulate with a cane.She denies any chest pain, but she has been having some dyspnea on exertion. She denies any hemoptysis but has had a nonproductive cough. She denies any vomiting, diarrhea, dysuria, hematuria.  ED Course: In the emergency department, febrile with a temperature of 101.105F, RR20, pulse 95 bpm, BP 135/60, O2 sat 95% on room air. Work-up in the ED showed normal CBC except for leukocytosis, BMP showed hyponatremia and hyperglycemia. Albumin 3.3, ALP 147, lactic acid was, urinalysis was positive for glycosuria. Influenza A, B and SARS coronavirus 2 was negative. CT of head showed no acute intracranial abnormality Chest x-ray showed mild cardiac enlargement. No edema or airspace opacity noted Incision and drainage was done, IV hydration was provided, she was started on IV antibiotics  and Tylenol was given due to fever. Hospitalist was asked to admit patient for further evaluation and management.In the ED, a bedside I&D was attempted.  General surgery was consulted to assist.  On the evening of 10/16/20, patient developed cp, sob with afib RVR.  She was initially started on amiodarone drip and moved to SDU.  She spontaneously converted to sinus after <10 hrs.  Assessment/Plan: Sepsis -Present on admission -Presented with tachycardia, leukocytosis, and fever -Secondary to abdominal wall abscess -Lactic acid peaked 1.6 -Follow blood cultures--suggests contaminant -UA negative for pyuria -Continue IV antibiotics -continue IVF -sepsis physiology resolved  Abdominal wall abscess -continuevancomycin -continue ceftriaxone -d/c aztreonam -General surgery consult appreciated -CT abd/pelvis as abd wall remains quite red/indurated  Atrial fibrillation with RVR, paroxysmal -now back to NSR--remains in sinus -initially started on amio>>discontinue -if recurrs, may need long term AC -CHADSVASc = 5 (HTN, age, DM, ASVD, female)  Uncontrolled diabetes mellitus type 2 with hyperglycemia -07/25/2020 hemoglobin A1c 9.7 -Start Lantus--increase to 20 units -NovoLog sliding scale -add novolog 4 units with meals  Syncope and collapse -Secondary to vasovagalphenomena -10/16/20 Echo EF 65-70%, no WMA, normal RV, mild AS -Check orthostatics  Hyponatremia -Secondary to volume depletion, poor solute intake, and hyperglycemia -Continue IV fluids>>improved  Seizure disorder -Continue Dilantin  Hyperlipidemia -Continue Lipitor  Anxiety/depression -Continue BuSpar and Cymbalta  Essential hypertension -hold losartan to allow BP margin for chronotropic meds  Status is: Inpatient  Remains inpatient appropriate because:IV treatments appropriate due to intensity of illness or inability to take PO   Dispo: The patient is from:Home Anticipated  d/c is WJ:XBJY Patient currently is not medically stable to d/c. Difficult  to place patient No        Family Communication:noFamily at bedside  Consultants:General surgery  Code Status: FULL  DVT Prophylaxis: [place] Lovenox   Procedures: As Listed in Progress Note Above  Antibiotics: vanc 6/3>> Ceftriaxone 6/4>>      Status is: Inpatient  Remains inpatient appropriate because:IV treatments appropriate due to intensity of illness or inability to take PO   Dispo: The patient is from: Home              Anticipated d/c is to: Home              Patient currently is not medically stable to d/c.   Difficult to place patient No             Subjective: Patient complains of abd wall pain.  She states she is breathing better.  She denies any f/c, cp, sob, n/v/d, abd pain  Objective: Vitals:   10/18/20 0522 10/18/20 0600 10/18/20 0800 10/18/20 0923  BP: (!) 123/40 (!) 143/40 (!) 132/49   Pulse: 79 77 74   Resp: (!) 24 (!) 26 18   Temp:   98.7 F (37.1 C)   TempSrc:   Oral   SpO2: 94% 96% 95% 97%  Weight:      Height:        Intake/Output Summary (Last 24 hours) at 10/18/2020 1115 Last data filed at 10/18/2020 0906 Gross per 24 hour  Intake 3271.34 ml  Output 900 ml  Net 2371.34 ml   Weight change:  Exam:   General:  Pt is alert, follows commands appropriately, not in acute distress  HEENT: No icterus, No thrush, No neck mass, [place]/AT  Cardiovascular: RRR, S1/S2, no rubs, no gallops  Respiratory: bibasilar rales. No wheeze  Abdomen: Soft/+BS, non tender, non distended, no guarding  Extremities: trace LE edema, No lymphangitis, No petechiae, No rashes, no synovitis   Data Reviewed: I have personally reviewed following labs and imaging studies Basic Metabolic Panel: Recent Labs  Lab 10/15/20 1557 10/15/20 2225 10/16/20 0604 10/17/20 0148 10/17/20 1130 10/18/20 0433  NA 132*  --  136 134*  --   138  K 4.4  --  4.0 3.5  --  3.5  CL 100  --  103 101  --  103  CO2 24  --  26 24  --  27  GLUCOSE 375*  --  304* 261*  --  206*  BUN 19  --  10 7*  --  7*  CREATININE 0.92 0.73 0.68 0.64  --  0.60  CALCIUM 8.5*  --  8.3* 8.3*  --  8.1*  MG  --   --  1.8  --  1.8 1.7  PHOS  --   --  2.3*  --   --   --    Liver Function Tests: Recent Labs  Lab 10/15/20 1557 10/16/20 0604  AST 12* 12*  ALT 20 18  ALKPHOS 147* 129*  BILITOT 1.0 1.0  PROT 7.5 6.8  ALBUMIN 3.3* 2.9*   No results for input(s): LIPASE, AMYLASE in the last 168 hours. No results for input(s): AMMONIA in the last 168 hours. Coagulation Profile: Recent Labs  Lab 10/15/20 1557 10/16/20 0604  INR 1.1 1.2   CBC: Recent Labs  Lab 10/15/20 1557 10/15/20 2225 10/16/20 0604 10/17/20 0336 10/18/20 0433  WBC 12.7* 10.5 9.7 10.0 8.6  NEUTROABS 9.8*  --   --   --   --   HGB 13.1  12.9 12.6 11.8* 11.8*  HCT 40.5 40.4 39.9 36.4 37.2  MCV 90.0 91.0 91.1 89.7 89.9  PLT 248 245 236 252 295   Cardiac Enzymes: No results for input(s): CKTOTAL, CKMB, CKMBINDEX, TROPONINI in the last 168 hours. BNP: Invalid input(s): POCBNP CBG: Recent Labs  Lab 10/17/20 0739 10/17/20 1108 10/17/20 1617 10/17/20 2058 10/18/20 0736  GLUCAP 294* 269* 312* 242* 183*   HbA1C: No results for input(s): HGBA1C in the last 72 hours. Urine analysis:    Component Value Date/Time   COLORURINE YELLOW 10/15/2020 1541   APPEARANCEUR CLEAR 10/15/2020 1541   LABSPEC 1.016 10/15/2020 1541   PHURINE 7.0 10/15/2020 1541   GLUCOSEU >=500 (A) 10/15/2020 1541   HGBUR NEGATIVE 10/15/2020 1541   BILIRUBINUR NEGATIVE 10/15/2020 1541   KETONESUR 20 (A) 10/15/2020 1541   PROTEINUR NEGATIVE 10/15/2020 1541   UROBILINOGEN 1.0 11/21/2014 1800   NITRITE NEGATIVE 10/15/2020 1541   LEUKOCYTESUR NEGATIVE 10/15/2020 1541   Sepsis Labs: (procalcitonin:4,lacticidven:4) ) Recent Results (from the past 240 hour(s))  Urine culture     Status:  Abnormal   Collection Time: 10/15/20  3:41 PM   Specimen: In/Out Cath Urine  Result Value Ref Range Status   Specimen Description   Final    IN/OUT CATH URINE Performed at [facility], [address] [place], [place] [address]    Special Requests   Final    NONE Performed at [facility], [address], [place], [place] [address]    Culture MULTIPLE SPECIES PRESENT, SUGGEST RECOLLECTION (A)  Final   Report Status 10/18/2020 FINAL  Final  Blood Culture (routine x 2)     Status: Abnormal   Collection Time: 10/15/20  3:58 PM   Specimen: Left Antecubital; Blood  Result Value Ref Range Status   Specimen Description   Final    LEFT ANTECUBITAL BOTTLES DRAWN AEROBIC AND ANAEROBIC Performed at [facility], [address] [place], [place] [address]    Special Requests   Final    Blood Culture adequate volume Performed at [facility], [address], [place], [place] [address]    Culture  Setup Time   Final    GRAM POSITIVE COCCI AEROBIC BOTTLE ONLY Gram Stain Report Called to,Read Back By and Verified With: COLEMAN @ 1309 ON [id number] BY HENDERSON L. CRITICAL RESULT CALLED TO, READ BACK BY AND VERIFIED WITH: Meryl Dare RN  10/16/20 EB    Culture (A)  Final    STAPHYLOCOCCUS EPIDERMIDIS THE SIGNIFICANCE OF ISOLATING THIS ORGANISM FROM A SINGLE SET OF BLOOD CULTURES WHEN MULTIPLE SETS ARE DRAWN IS UNCERTAIN. PLEASE NOTIFY THE MICROBIOLOGY DEPARTMENT WITHIN ONE WEEK IF SPECIATION AND SENSITIVITIES ARE REQUIRED. Performed at [facility], [address], [place], [place] [address]    Report Status 10/17/2020 FINAL  Final  Blood Culture ID Panel (Reflexed)     Status: Abnormal   Collection Time: 10/15/20  3:58 PM  Result Value Ref Range Status   Enterococcus faecalis NOT DETECTED NOT DETECTED Final   Enterococcus Faecium NOT DETECTED NOT DETECTED Final   Listeria monocytogenes NOT DETECTED NOT DETECTED Final   Staphylococcus species DETECTED (A) NOT DETECTED Final     Comment: CRITICAL RESULT CALLED TO, READ BACK BY AND VERIFIED WITH: Meryl Dare RN  10/16/20 EB    Staphylococcus aureus (BCID) NOT DETECTED NOT DETECTED Final   Staphylococcus epidermidis DETECTED (A) NOT DETECTED Final    Comment: CRITICAL RESULT CALLED TO, READ BACK BY AND VERIFIED WITH: Meryl Dare RN @  1843 10/16/20 EB    Staphylococcus lugdunensis NOT DETECTED NOT DETECTED Final   Streptococcus species NOT DETECTED NOT DETECTED Final   Streptococcus agalactiae NOT DETECTED NOT DETECTED Final   Streptococcus pneumoniae NOT DETECTED NOT DETECTED Final   Streptococcus pyogenes NOT DETECTED NOT DETECTED Final   A.calcoaceticus-baumannii NOT DETECTED NOT DETECTED Final   Bacteroides fragilis NOT DETECTED NOT DETECTED Final   Enterobacterales NOT DETECTED NOT DETECTED Final   Enterobacter cloacae complex NOT DETECTED NOT DETECTED Final   Escherichia coli NOT DETECTED NOT DETECTED Final   Klebsiella aerogenes NOT DETECTED NOT DETECTED Final   Klebsiella oxytoca NOT DETECTED NOT DETECTED Final   Klebsiella pneumoniae NOT DETECTED NOT DETECTED Final   Proteus species NOT DETECTED NOT DETECTED Final   Salmonella species NOT DETECTED NOT DETECTED Final   Serratia marcescens NOT DETECTED NOT DETECTED Final   Haemophilus influenzae NOT DETECTED NOT DETECTED Final   Neisseria meningitidis NOT DETECTED NOT DETECTED Final   Pseudomonas aeruginosa NOT DETECTED NOT DETECTED Final   Stenotrophomonas maltophilia NOT DETECTED NOT DETECTED Final   Candida albicans NOT DETECTED NOT DETECTED Final   Candida auris NOT DETECTED NOT DETECTED Final   Candida glabrata NOT DETECTED NOT DETECTED Final   Candida krusei NOT DETECTED NOT DETECTED Final   Candida parapsilosis NOT DETECTED NOT DETECTED Final   Candida tropicalis NOT DETECTED NOT DETECTED Final   Cryptococcus neoformans/gattii NOT DETECTED NOT DETECTED Final   Methicillin resistance mecA/C NOT DETECTED NOT DETECTED Final    Comment:  Performed at [facility], [address] [place], [place] [address]  Blood Culture (routine x 2)     Status: None (Preliminary result)   Collection Time: 10/15/20  4:07 PM   Specimen: Right Antecubital; Blood  Result Value Ref Range Status   Specimen Description   Final    RIGHT ANTECUBITAL BOTTLES DRAWN AEROBIC AND ANAEROBIC   Special Requests Blood Culture adequate volume  Final   Culture   Final    NO GROWTH 3 DAYS Performed at [facility], [address] [place], [place] [address]    Report Status PENDING  Incomplete  Resp Panel by RT-PCR (Flu A&B, Covid) Nasopharyngeal Swab     Status: None   Collection Time: 10/15/20  5:17 PM   Specimen: Nasopharyngeal Swab; Nasopharyngeal(NP) swabs in vial transport medium  Result Value Ref Range Status   SARS Coronavirus 2 by RT PCR NEGATIVE NEGATIVE Final    Comment: (NOTE) SARS-CoV-2 target nucleic acids are NOT DETECTED.  The SARS-CoV-2 RNA is generally detectable in upper respiratory specimens during the acute phase of infection. The lowest concentration of SARS-CoV-2 viral copies this assay can detect is 138 copies/mL. A negative result does not preclude SARS-Cov-2 infection and should not be used as the sole basis for treatment or other patient management decisions. A negative result may occur with  improper specimen collection/handling, submission of specimen other than nasopharyngeal swab, presence of viral mutation(s) within the areas targeted by this assay, and inadequate number of viral copies(<138 copies/mL). A negative result must be combined with clinical observations, patient history, and epidemiological information. The expected result is Negative.  Fact Sheet for Patients:  BloggerCourse.comhttps://www.fda.gov/media/152166/download  Fact Sheet for Healthcare Providers:  SeriousBroker.ithttps://www.fda.gov/media/152162/download  This test is no t yet approved or cleared by the [place] FDA and  has been authorized for detection and/or  diagnosis of SARS-CoV-2 by FDA under an Emergency Use Authorization (EUA). This EUA will remain  in effect (meaning this test  can be used) for the duration of the COVID-19 declaration under Section 564(b)(1) of the Act, 21 U.S.C.section 360bbb-3(b)(1), unless the authorization is terminated  or revoked sooner.       Influenza A by PCR NEGATIVE NEGATIVE Final   Influenza B by PCR NEGATIVE NEGATIVE Final    Comment: (NOTE) The Xpert Xpress SARS-CoV-2/FLU/RSV plus assay is intended as an aid in the diagnosis of influenza from Nasopharyngeal swab specimens and should not be used as a sole basis for treatment. Nasal washings and aspirates are unacceptable for Xpert Xpress SARS-CoV-2/FLU/RSV testing.  Fact Sheet for Patients: BloggerCourse.com  Fact Sheet for Healthcare Providers: SeriousBroker.it  This test is not yet approved or cleared by the [place] FDA and has been authorized for detection and/or diagnosis of SARS-CoV-2 by FDA under an Emergency Use Authorization (EUA). This EUA will remain in effect (meaning this test can be used) for the duration of the COVID-19 declaration under Section 564(b)(1) of the Act, 21 U.S.C. section 360bbb-3(b)(1), unless the authorization is terminated or revoked.  Performed at [facility], [address], [place], [place] [address]   MRSA PCR Screening     Status: None   Collection Time: 10/17/20  2:40 AM   Specimen: Nasal Mucosa; Nasopharyngeal  Result Value Ref Range Status   MRSA by PCR NEGATIVE NEGATIVE Final    Comment:        The GeneXpert MRSA Assay (FDA approved for NASAL specimens only), is one component of a comprehensive MRSA colonization surveillance program. It is not intended to diagnose MRSA infection nor to guide or monitor treatment for MRSA infections. Performed at [facility], [address] [place], [place] [address]      Scheduled Meds: . busPIRone  20 mg  Oral TID  . Chlorhexidine Gluconate Cloth  6 each Topical Daily  . DULoxetine  60 mg Oral Daily  . enoxaparin (LOVENOX) injection  130 mg Subcutaneous Q12H  . feeding supplement (GLUCERNA SHAKE)  237 mL Oral TID BM  . fluticasone furoate-vilanterol  1 puff Inhalation Daily  . insulin aspart  0-20 Units Subcutaneous TID WC  . insulin aspart  0-5 Units Subcutaneous QHS  . insulin glargine  20 Units Subcutaneous Daily  . lidocaine (PF)  10 mL Intradermal Once  . LORazepam  1 mg Intravenous Once  . mouth rinse  15 mL Mouth Rinse BID  . nystatin   Topical BID  . pantoprazole  40 mg Oral Daily  . phenytoin  400 mg Oral Daily  . polyethylene glycol  17 g Oral Daily  . traZODone  50 mg Oral QHS   Continuous Infusions: . 0.9 % NaCl with KCl 20 mEq / L 75 mL/hr at 10/18/20 0143  . cefTRIAXone (ROCEPHIN)  IV 2 g (10/17/20 1659)  . vancomycin 1,250 mg (10/18/20 0533)    Procedures/Studies: CT Head Wo Contrast  Result Date: 10/15/2020 CLINICAL DATA:  Dizziness, syncope EXAM: CT HEAD WITHOUT CONTRAST TECHNIQUE: Contiguous axial images were obtained from the base of the skull through the vertex without intravenous contrast. COMPARISON:  None. FINDINGS: Brain: No acute intracranial abnormality. Specifically, no hemorrhage, hydrocephalus, mass lesion, acute infarction, or significant intracranial injury. Vascular: No hyperdense vessel or unexpected calcification. Skull: No acute calvarial abnormality. Sinuses/Orbits: No acute findings Other: None IMPRESSION: No acute intracranial abnormality. Electronically Signed   By: Charlett Nose M.D.   On: 10/15/2020 18:59   CT CHEST W CONTRAST  Result Date: 10/17/2020 CLINICAL DATA:  Sepsis, atrial fibrillation with rapid ventricular  response EXAM: CT CHEST WITH CONTRAST TECHNIQUE: Multidetector CT imaging of the chest was performed during intravenous contrast administration. CONTRAST:  75mL OMNIPAQUE IOHEXOL 300 MG/ML  SOLN COMPARISON:  None. FINDINGS:  Cardiovascular: Cardiac size within normal limits. Extensive multi-vessel coronary artery calcification, largely within the left anterior descending coronary artery. No pericardial effusion. The central pulmonary arteries are of normal caliber. 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 No aortic aneurysm. Mild atherosclerotic calcification within the descending thoracic aorta. Mediastinum/Nodes: The visualized thyroid is unremarkable. No pathologic thoracic adenopathy. Small hiatal hernia. Esophagus is unremarkable. Lungs/Pleura: Lungs are clear. No pleural effusion or pneumothorax. The central airways are widely patent. Upper Abdomen: Status post cholecystectomy.  No acute abnormality. Musculoskeletal: The osseous structures are age-appropriate. No acute bone abnormality. No lytic or blastic bone lesion. IMPRESSION: No acute intrathoracic pathology identified. No definite radiographic explanation for the patient's reported symptoms. Extensive coronary artery calcification. Small hiatal hernia. Aortic Atherosclerosis (ICD10-I70.0). Electronically Signed   By: Helyn Numbers MD   On: 10/17/2020 02:51   DG Chest Port 1 View  Result Date: 10/15/2020 CLINICAL DATA:  Shortness of breath EXAM: PORTABLE CHEST 1 VIEW COMPARISON:  April 10, 2020 FINDINGS: There is no edema or airspace opacity. Heart is mildly enlarged with pulmonary vascularity normal. No adenopathy. There is degenerative change in each shoulder. IMPRESSION: Mild cardiac enlargement.  No edema or airspace opacity. Electronically Signed   By: Bretta Bang III M.D.   On: 10/15/2020 16:44   ECHOCARDIOGRAM COMPLETE  Result Date: 10/16/2020    ECHOCARDIOGRAM REPORT   Patient Name:   EMBERLI BALLESTER Date of Exam: 10/16/2020 Medical Rec #:  [id number]    Height:       65.0 in Accession #:    [id number]   Weight:       283.1 lb Date of Birth:  1951/03/18    BSA:          2.293 m Patient Age:    69 years     BP:           139/70 mmHg Patient  Gender: F            HR:           78 bpm. [facility] Procedure: 2D Echo, Cardiac Doppler and Color Doppler Indications:    Syncope R55  History:        Patient has no prior history of Echocardiogram examinations.                 Risk Factors:Hypertension, Dyslipidemia and Diabetes. Abdominal                 wall abscess, Morbid Obesity.  Sonographer:    Celesta Gentile RCS Referring Phys: [id number] OLADAPO ADEFESO IMPRESSIONS  1. Left ventricular ejection fraction, by estimation, is 65 to 70%. The left ventricle has normal function. The left ventricle has no regional wall motion abnormalities. There is mild left ventricular hypertrophy. Left ventricular diastolic parameters are consistent with Grade I diastolic dysfunction (impaired relaxation).  2. Right ventricular systolic function is normal. The right ventricular size is normal. Tricuspid regurgitation signal is inadequate for assessing PA pressure.  3. Left atrial size was moderately dilated.  4. Right atrial size was mildly dilated.  5. The mitral valve is grossly normal. Trivial mitral valve regurgitation.  6. The aortic valve is tricuspid. There is mild calcification of the aortic valve. Aortic valve regurgitation is not visualized. There is moderate sclerosis to  mild aortic valve stenosis. Aortic valve mean gradient measures 12.0 mmHg.  7. The inferior vena cava is normal in size with greater than 50% respiratory variability, suggesting right atrial pressure of 3 mmHg. FINDINGS  Left Ventricle: Left ventricular ejection fraction, by estimation, is 65 to 70%. The left ventricle has normal function. The left ventricle has no regional wall motion abnormalities. Definity contrast agent was given IV to delineate the left ventricular  endocardial borders. The left ventricular internal cavity size was normal in size. There is mild left ventricular hypertrophy. Left ventricular diastolic parameters are consistent with Grade I diastolic dysfunction  (impaired relaxation). Right Ventricle: The right ventricular size is normal. No increase in right ventricular wall thickness. Right ventricular systolic function is normal. Tricuspid regurgitation signal is inadequate for assessing PA pressure. Left Atrium: Left atrial size was moderately dilated. Right Atrium: Right atrial size was mildly dilated. Pericardium: There is no evidence of pericardial effusion. Mitral Valve: The mitral valve is grossly normal. There is mild calcification of the mitral valve leaflet(s). Trivial mitral valve regurgitation. Tricuspid Valve: The tricuspid valve is grossly normal. Tricuspid valve regurgitation is trivial. Aortic Valve: The aortic valve is tricuspid. There is mild calcification of the aortic valve. There is mild aortic valve annular calcification. Aortic valve regurgitation is not visualized. Mild aortic stenosis is present. Aortic valve mean gradient measures 12.0 mmHg. Aortic valve peak gradient measures 24.2 mmHg. Aortic valve area, by VTI measures 1.76 cm. Pulmonic Valve: The pulmonic valve was not well visualized. Pulmonic valve regurgitation is trivial. Aorta: The aortic root is normal in size and structure. Venous: The inferior vena cava is normal in size with greater than 50% respiratory variability, suggesting right atrial pressure of 3 mmHg. IAS/Shunts: No atrial level shunt detected by color flow Doppler.  LEFT VENTRICLE PLAX 2D LVIDd:         4.07 cm  Diastology LVIDs:         2.65 cm  LV e' medial:    7.07 cm/s LV PW:         1.25 cm  LV E/e' medial:  15.0 LV IVS:        1.20 cm  LV e' lateral:   12.50 cm/s LVOT diam:     2.10 cm  LV E/e' lateral: 8.5 LV SV:         102 LV SV Index:   45 LVOT Area:     3.46 cm  RIGHT VENTRICLE RV S prime:     22.60 cm/s TAPSE (M-mode): 3.3 cm LEFT ATRIUM              Index       RIGHT ATRIUM           Index LA diam:        4.50 cm  1.96 cm/m  RA Area:     25.60 cm LA Vol (A2C):   66.5 ml  29.00 ml/m RA Volume:   86.40 ml   37.68 ml/m LA Vol (A4C):   120.0 ml 52.34 ml/m LA Biplane Vol: 93.5 ml  40.78 ml/m  AORTIC VALVE AV Area (Vmax):    1.87 cm AV Area (Vmean):   1.85 cm AV Area (VTI):     1.76 cm AV Vmax:           246.00 cm/s AV Vmean:          161.000 cm/s AV VTI:            0.582 m AV  Peak Grad:      24.2 mmHg AV Mean Grad:      12.0 mmHg LVOT Vmax:         133.00 cm/s LVOT Vmean:        85.900 cm/s LVOT VTI:          0.295 m LVOT/AV VTI ratio: 0.51  AORTA Ao Root diam: 3.30 cm MITRAL VALVE MV Area (PHT): 3.08 cm     SHUNTS MV Decel Time: 246 msec     Systemic VTI:  0.30 m MV E velocity: 106.00 cm/s  Systemic Diam: 2.10 cm MV A velocity: 135.00 cm/s MV E/A ratio:  0.79 Nona Dell MD Electronically signed by Nona Dell MD Signature Date/Time: 10/16/2020/12:37:25 PM    Final     Catarina Hartshorn, DO  [facility]  If 7PM-7AM, please contact night-coverage [email or web address] Password [email or web address] 10/18/2020, 11:15 AM   LOS: 3 days

## 2020-10-19 ENCOUNTER — Inpatient Hospital Stay (HOSPITAL_COMMUNITY): Payer: Medicare HMO

## 2020-10-19 LAB — BASIC METABOLIC PANEL
Anion gap: 7 (ref 5–15)
BUN: 5 mg/dL — ABNORMAL LOW (ref 8–23)
CO2: 25 mmol/L (ref 22–32)
Calcium: 7.9 mg/dL — ABNORMAL LOW (ref 8.9–10.3)
Chloride: 102 mmol/L (ref 98–111)
Creatinine, Ser: 0.54 mg/dL (ref 0.44–1.00)
GFR, Estimated: >60 mL/min (ref >60)
Glucose, Bld: 271 mg/dL — ABNORMAL HIGH (ref 70–99)
Potassium: 3.6 mmol/L (ref 3.5–5.1)
Sodium: 134 mmol/L — ABNORMAL LOW (ref 135–145)

## 2020-10-19 LAB — CBC
HCT: 36.9 % (ref 36.0–46.0)
Hemoglobin: 11.8 g/dL — ABNORMAL LOW (ref 12.0–15.0)
MCH: 28.9 pg (ref 26.0–34.0)
MCHC: 32 g/dL (ref 30.0–36.0)
MCV: 90.4 fL (ref 80.0–100.0)
Platelets: 311 10*3/uL (ref 150–400)
RBC: 4.08 MIL/uL (ref 3.87–5.11)
RDW: 13.2 % (ref 11.5–15.5)
WBC: 8.5 10*3/uL (ref 4.0–10.5)
nRBC: 0 % (ref 0.0–0.2)

## 2020-10-19 LAB — VANCOMYCIN, TROUGH: Vancomycin Tr: 10 ug/mL — ABNORMAL LOW (ref 15–20)

## 2020-10-19 LAB — GLUCOSE, CAPILLARY
Glucose-Capillary: 263 mg/dL — ABNORMAL HIGH (ref 70–99)
Glucose-Capillary: 301 mg/dL — ABNORMAL HIGH (ref 70–99)
Glucose-Capillary: 308 mg/dL — ABNORMAL HIGH (ref 70–99)
Glucose-Capillary: 144 mg/dL — ABNORMAL HIGH (ref 70–99)
Glucose-Capillary: 170 mg/dL — ABNORMAL HIGH (ref 70–99)

## 2020-10-19 LAB — MAGNESIUM: Magnesium: 2.2 mg/dL (ref 1.7–2.4)

## 2020-10-19 MED ORDER — SODIUM CHLORIDE 0.9 % IV SOLN
2.0000 g | Freq: Three times a day (TID) | INTRAVENOUS | Status: DC
  Administered 2020-10-19 – 2020-10-22 (×9): 2 g via INTRAVENOUS
  Filled 2020-10-19 (×9): qty 2

## 2020-10-19 MED ORDER — INSULIN GLARGINE 100 UNIT/ML ~~LOC~~ SOLN
40.0000 [IU] | Freq: Every day | SUBCUTANEOUS | Status: DC
  Administered 2020-10-19 – 2020-10-21 (×3): 40 [IU] via SUBCUTANEOUS
  Filled 2020-10-19 (×6): qty 0.4

## 2020-10-19 MED ORDER — INSULIN ASPART 100 UNIT/ML IJ SOLN
10.0000 [IU] | Freq: Three times a day (TID) | INTRAMUSCULAR | Status: DC
  Administered 2020-10-19 – 2020-10-20 (×4): 10 [IU] via SUBCUTANEOUS

## 2020-10-19 NOTE — Progress Notes (Signed)
Pharmacy Antibiotic Note  Donna Snow is a 70 y.o. female admitted on 10/15/2020 with cellulitis.  Pharmacy has been consulted for Vancomycin dosing.  VT = 25mcg/ml, therapeutic. Abdominal wall abscess slowly improving.  Plan: Continue vancomycin 1250mg  IV q12hrs  Also on ceftriaxone 2gm IV q24h Monitor labs, c/s, and vanco level as indicated.  Last recorded weight for pt was 149Kg, SCr 0.92 Height: 5\' 5"  (165.1 cm) Weight: (!) 147.9 kg (326 lb 1 oz) IBW/kg (Calculated) : 57  Temp (24hrs), Avg:99.7 F (37.6 C), Min:98.4 F (36.9 C), Max:102 F (38.9 C)  Recent Labs  Lab 10/15/20 1557 10/15/20 1919 10/15/20 2225 10/16/20 0604 10/17/20 0148 10/17/20 0150 10/17/20 0336 10/18/20 0433 10/19/20 0717  WBC 12.7*  --  10.5 9.7  --   --  10.0 8.6 8.5  CREATININE 0.92  --  0.73 0.68 0.64  --   --  0.60 0.54  LATICACIDVEN 1.3 1.6  --   --   --  1.1 1.0  --   --   VANCOTROUGH  --   --   --   --   --   --   --   --  10*    Estimated Creatinine Clearance: 99.2 mL/min (by C-G formula based on SCr of 0.54 mg/dL).    Allergies  Allergen Reactions  . Latex Other (See Comments) and Hives  . Penicillins     Antimicrobials this admission: Aztreonam 6/3 >>6/4 CTX 6/4 >> Vanco 6/3 >>  6/3 Bcx: 1 bottle- staph epi- contaminant  6/3 Ucx: multiple species 6/5 MRSA PCR neg  Thank you for allowing pharmacy to be a part of this patient's care.  8/3, BS 8/3, Elder Cyphers Clinical Pharmacist Pager [phone] 10/19/2020 10:33 AM

## 2020-10-19 NOTE — Progress Notes (Signed)
Inpatient Diabetes Program Recommendations  AACE/ADA: New Consensus Statement on Inpatient Glycemic Control (2015)  Target Ranges:  Prepandial:   less than 140 mg/dL      Peak postprandial:   less than 180 mg/dL (1-2 hours)      Critically ill patients:  140 - 180 mg/dL   Lab Results  Component Value Date   GLUCAP 301 (H) 10/19/2020   HGBA1C 9.7 (H) 10/15/2020    Review of Glycemic Control  Diabetes history: DM2 Outpatient Diabetes medications: U-500 12-15 units TID with meals Current orders for Inpatient glycemic control: Lantus 20 units QD, Novolog 0-20 units TID with meals and 0-5 HS + 4 units TID  HgA1C - 9.7%  Inpatient Diabetes Program Recommendations:     Consider U-500 12 units TID. Novolog 0-20 units TID with meals and 0-5 HS D/C Lantus.  Secure text to Dr Tat.  Spoke with pt about her HgbA1C of 9.7%. Pt states her Endo is Dr Katrinka Blazing and she had gotten her HgbA1C down to around 7-8%, before the infection. Requesting to be on U-500 insulin, although pt said she thought [facility] did not stock it. Discussed importance of improving glycemic control to reduce risks of complications. Stressefd importance of lifestyle modification with diet and exercise. Pt appreciative of call.  Thank you. Ailene Ards, RD, LDN, CDE Inpatient Diabetes Coordinator [phone]

## 2020-10-19 NOTE — Progress Notes (Signed)
[facility]  CT abd without collections just cellulitis and no drainable collections. Needs to continue antibiotics and packing. The gas in the subcutaneous region is where the packing is in place. No gas outside of that area. Not worried about any gas forming organisms.   Continue packing. Antibiotics  Algis Greenhouse, MD [facility] [address] [place] [employer], [place] [address] [phone] (office)

## 2020-10-19 NOTE — Progress Notes (Signed)
PROGRESS NOTE  Donna Snow HQP:[id number] DOB: June 22, 1950 DOA: 10/15/2020 PCP: [facility]  Brief History:   70 y.o.femalewith medical history significant forT2DM, hypertension, hyperlipidemia, GERD, chronic back pain obesity who presents to the emergency department due to an abscesson her abdominal wall.Patient states that it started out as a small papule about 2 weeks prior to this admission. She was using warm compresses on it, and it eventually became pustular and subsequently opened and drained purulent material. She has not able to get to see her PCP secondary to transportation issues. About 1 week prior to this admission, the patient began having generalized weakness, subjective fevers and chills, and poor p.o. intake. She began feeling dizzy at that time. About 2 days prior to this admission, the patient was getting up out of her chair to go to the kitchen when she felt dizzy and subsequently had a syncopal episode. She denied biting her tongue or bowel or bladder incontinence. She denied any postictal type symptoms. At baseline, the patient is able to ambulate with a cane.She denies any chest pain, but she has been having some dyspnea on exertion. She denies any hemoptysis but has had a nonproductive cough. She denies any vomiting, diarrhea, dysuria, hematuria.  ED Course: In the emergency department, febrile with a temperature of 101.45F, RR20, pulse 95 bpm, BP 135/60, O2 sat 95% on room air. Work-up in the ED showed normal CBC except for leukocytosis, BMP showed hyponatremia and hyperglycemia. Albumin 3.3, ALP 147, lactic acid was, urinalysis was positive for glycosuria. Influenza A, B and SARS coronavirus 2 was negative. CT of head showed no acute intracranial abnormality Chest x-ray showed mild cardiac enlargement. No edema or airspace opacity noted Incision and drainage was done, IV hydration was provided, she was started on IV antibiotics  and Tylenol was given due to fever. Hospitalist was asked to admit patient for further evaluation and management.In the ED, a bedside I&D was attempted.  General surgery was consulted to assist. On the evening of 10/16/20, patient developed cp, sob with afib RVR. She was initially started on amiodarone drip and moved to SDU.  She spontaneously converted to sinus after <10 hrs.  Assessment/Plan: Sepsis -Present on admission -Presented with tachycardia, leukocytosis, and fever -Secondary to abdominal wall abscess -Lactic acid peaked 1.6 -Follow blood cultures--suggests contaminant -UA negative for pyuria -Continue IV antibiotics -continue IVF -sepsis physiology resolved  Abdominal wall abscess -continuevancomycin -discontinueceftriaxone -start cefepime -d/c aztreonam -General surgery consultappreciated -CT abd/pelvis as abd wall--Anterior abdominal wall subcutaneous edema, likely representing cellulitis.  No fluid collection  Atrial fibrillation with RVR, paroxysmal -now back to NSR--remains in sinus -initially started on amio>>discontinue -if recurrs, may need long term AC -CHADSVASc = 5 (HTN, age, DM, ASVD, female)  Uncontrolled diabetes mellitus type 2 with hyperglycemia -07/25/2020 hemoglobin A1c 9.7 -Increase Lantus--increase to 40 units -NovoLog sliding scale -add novolog 10 units with meals  Syncope and collapse -Secondary to vasovagalphenomena -6/4/22EchoEF 65-70%, no WMA, normal RV, mild AS -Check orthostatics  Hyponatremia -Secondary to volume depletion, poor solute intake, and hyperglycemia -Continue IV fluids>>improved  Seizure disorder -Continue Dilantin  Hyperlipidemia -Continue Lipitor  Anxiety/depression -Continue BuSpar and Cymbalta  Essential hypertension -holdlosartanto allow BP margin for chronotropic meds  Right foot/ankle pain -Xray R-foot and ankle  Status is: Inpatient  Remains inpatient appropriate because:IV  treatments appropriate due to intensity of illness or inability to take PO   Dispo: The patient is from:Home Anticipated d/c is  IO:[facility]EX Patient currently is not medically stable to d/c. Difficult to place patient No        Family Communication:noFamily at bedside  Consultants:General surgery  Code Status: FULL  DVT Prophylaxis: [place] Lovenox   Procedures: As Listed in Progress Note Above  Antibiotics: vanc 6/3>> Ceftriaxone 6/4>>6/7 Cefepime 6/7>>      Status is: Inpatient  Remains inpatient appropriate because:IV treatments appropriate due to intensity of illness or inability to take PO   Dispo: The patient is from: Home  Anticipated d/c is to: Home  Patient currently is not medically stable to d/c.              Difficult to place patient No        Subjective: Patient complains of right foot/ankle pain.  Complains of abd wall pain.  Denies f/c, cp, sob, n/v/d, headache  Objective: Vitals:   10/19/20 0100 10/19/20 0628 10/19/20 0841 10/19/20 1359  BP: (!) 163/68 (!) 158/65  (!) 150/61  Pulse: 92 81  79  Resp: Temp: 100 F (37.8 C) 99.3 F (37.4 C)  98.9 F (37.2 C)  TempSrc: Oral Oral  Oral  SpO2: 97% 95% 93% 96%  Weight:      Height:        Intake/Output Summary (Last 24 hours) at 10/19/2020 1454 Last data filed at 10/19/2020 0538 Gross per 24 hour  Intake 1193.23 ml  Output --  Net 1193.23 ml   Weight change: -0.7 kg Exam:   General:  Pt is alert, follows commands appropriately, not in acute distress  HEENT: No icterus, No thrush, No neck mass, [place]/AT  Cardiovascular: RRR, S1/S2, no rubs, no gallops  Respiratory: bibasilar rales. No wheeze  Abdomen: Soft/+BS,  non distended, no guarding  Extremities: No edema, No lymphangitis, No petechiae, No rashes, no synovitis     Data Reviewed: I have personally reviewed following  labs and imaging studies Basic Metabolic Panel: Recent Labs  Lab 10/15/20 1557 10/15/20 2225 10/16/20 0604 10/17/20 0148 10/17/20 1130 10/18/20 0433 10/19/20 0717  NA 132*  --  136 134*  --  138 134*  K 4.4  --  4.0 3.5  --  3.5 3.6  CL 100  --  103 101  --  103 102  CO2 24  --  26 24  --  27 25  GLUCOSE 375*  --  304* 261*  --  206* 271*  BUN 19  --  10 7*  --  7* 5*  CREATININE 0.92 0.73 0.68 0.64  --  0.60 0.54  CALCIUM 8.5*  --  8.3* 8.3*  --  8.1* 7.9*  MG  --   --  1.8  --  1.8 1.7 2.2  PHOS  --   --  2.3*  --   --   --   --    Liver Function Tests: Recent Labs  Lab 10/15/20 1557 10/16/20 0604  AST 12* 12*  ALT 20 18  ALKPHOS 147* 129*  BILITOT 1.0 1.0  PROT 7.5 6.8  ALBUMIN 3.3* 2.9*   No results for input(s): LIPASE, AMYLASE in the last 168 hours. No results for input(s): AMMONIA in the last 168 hours. Coagulation Profile: Recent Labs  Lab 10/15/20 1557 10/16/20 0604  INR 1.1 1.2   CBC: Recent Labs  Lab 10/15/20 1557 10/15/20 2225 10/16/20 0604 10/17/20 0336 10/18/20 0433 10/19/20 0717  WBC 12.7* 10.5 9.7 10.0 8.6 8.5  NEUTROABS 9.8*  --   --   --   --   --  HGB 13.1 12.9 12.6 11.8* 11.8* 11.8*  HCT 40.5 40.4 39.9 36.4 37.2 36.9  MCV 90.0 91.0 91.1 89.7 89.9 90.4  PLT 248 245 236 252 295 311   Cardiac Enzymes: No results for input(s): CKTOTAL, CKMB, CKMBINDEX, TROPONINI in the last 168 hours. BNP: Invalid input(s): POCBNP CBG: Recent Labs  Lab 10/18/20 1130 10/18/20 1642 10/18/20 2123 10/19/20 0716 10/19/20 1124  GLUCAP 240* 211* 263* 301* 308*   HbA1C: No results for input(s): HGBA1C in the last 72 hours. Urine analysis:    Component Value Date/Time   COLORURINE YELLOW 10/15/2020 1541   APPEARANCEUR CLEAR 10/15/2020 1541   LABSPEC 1.016 10/15/2020 1541   PHURINE 7.0 10/15/2020 1541   GLUCOSEU >=500 (A) 10/15/2020 1541   HGBUR NEGATIVE 10/15/2020 1541   BILIRUBINUR NEGATIVE 10/15/2020 1541   KETONESUR 20 (A) 10/15/2020 1541    PROTEINUR NEGATIVE 10/15/2020 1541   UROBILINOGEN 1.0 11/21/2014 1800   NITRITE NEGATIVE 10/15/2020 1541   LEUKOCYTESUR NEGATIVE 10/15/2020 1541   Sepsis Labs: @LABRCNTIP (procalcitonin:4,lacticidven:4) ) Recent Results (from the past 240 hour(s))  Urine culture     Status: Abnormal   Collection Time: 10/15/20  3:41 PM   Specimen: In/Out Cath Urine  Result Value Ref Range Status   Specimen Description   Final    IN/OUT CATH URINE Performed at [facility], [address] [place], [place]    Special Requests   Final    NONE Performed at [facility], [address] [place], [place]    Culture MULTIPLE SPECIES PRESENT, SUGGEST RECOLLECTION (A)  Final   Report Status 10/18/2020 FINAL  Final  Blood Culture (routine x 2)     Status: Abnormal   Collection Time: 10/15/20  3:58 PM   Specimen: Left Antecubital; Blood  Result Value Ref Range Status   Specimen Description   Final    LEFT ANTECUBITAL BOTTLES DRAWN AEROBIC AND ANAEROBIC Performed at [facility], [address] [place], [place]    Special Requests   Final    Blood Culture adequate volume Performed at [facility], [address], [place], [place]    Culture  Setup Time   Final    GRAM POSITIVE COCCI AEROBIC BOTTLE ONLY Gram Stain Report Called to,Read Back By and Verified With: COLEMAN @ 1309 ON [id number] BY HENDERSON L. CRITICAL RESULT CALLED TO, READ BACK BY AND VERIFIED WITH: [id number] RN @1843  10/16/20 EB    Culture (A)  Final    STAPHYLOCOCCUS EPIDERMIDIS THE SIGNIFICANCE OF ISOLATING THIS ORGANISM FROM A SINGLE SET OF BLOOD CULTURES WHEN MULTIPLE SETS ARE DRAWN IS UNCERTAIN. PLEASE NOTIFY THE MICROBIOLOGY DEPARTMENT WITHIN ONE WEEK IF SPECIATION AND SENSITIVITIES ARE REQUIRED. Performed at [facility], [address] [place], [address] [place]    Report Status 10/17/2020 FINAL  Final  Blood Culture ID Panel (Reflexed)     Status: Abnormal   Collection Time:  10/15/20  3:58 PM  Result Value Ref Range Status   Enterococcus faecalis NOT DETECTED NOT DETECTED Final   Enterococcus Faecium NOT DETECTED NOT DETECTED Final   Listeria monocytogenes NOT DETECTED NOT DETECTED Final   Staphylococcus species DETECTED (A) NOT DETECTED Final    Comment: CRITICAL RESULT CALLED TO, READ BACK BY AND VERIFIED WITH: 12/17/2020 RN @1843  10/16/20 EB    Staphylococcus aureus (BCID) NOT DETECTED NOT DETECTED Final   Staphylococcus epidermidis DETECTED (A) NOT DETECTED Final    Comment: CRITICAL RESULT CALLED TO, READ BACK BY  AND VERIFIED WITH: Meryl DareLAUREN COLEMAN RN @1843  10/16/20 EB    Staphylococcus lugdunensis NOT DETECTED NOT DETECTED Final   Streptococcus species NOT DETECTED NOT DETECTED Final   Streptococcus agalactiae NOT DETECTED NOT DETECTED Final   Streptococcus pneumoniae NOT DETECTED NOT DETECTED Final   Streptococcus pyogenes NOT DETECTED NOT DETECTED Final   A.calcoaceticus-baumannii NOT DETECTED NOT DETECTED Final   Bacteroides fragilis NOT DETECTED NOT DETECTED Final   Enterobacterales NOT DETECTED NOT DETECTED Final   Enterobacter cloacae complex NOT DETECTED NOT DETECTED Final   Escherichia coli NOT DETECTED NOT DETECTED Final   Klebsiella aerogenes NOT DETECTED NOT DETECTED Final   Klebsiella oxytoca NOT DETECTED NOT DETECTED Final   Klebsiella pneumoniae NOT DETECTED NOT DETECTED Final   Proteus species NOT DETECTED NOT DETECTED Final   Salmonella species NOT DETECTED NOT DETECTED Final   Serratia marcescens NOT DETECTED NOT DETECTED Final   Haemophilus influenzae NOT DETECTED NOT DETECTED Final   Neisseria meningitidis NOT DETECTED NOT DETECTED Final   Pseudomonas aeruginosa NOT DETECTED NOT DETECTED Final   Stenotrophomonas maltophilia NOT DETECTED NOT DETECTED Final   Candida albicans NOT DETECTED NOT DETECTED Final   Candida auris NOT DETECTED NOT DETECTED Final   Candida glabrata NOT DETECTED NOT DETECTED Final   Candida krusei NOT  DETECTED NOT DETECTED Final   Candida parapsilosis NOT DETECTED NOT DETECTED Final   Candida tropicalis NOT DETECTED NOT DETECTED Final   Cryptococcus neoformans/gattii NOT DETECTED NOT DETECTED Final   Methicillin resistance mecA/C NOT DETECTED NOT DETECTED Final    Comment: Performed at [facility], [address] [place], [place] [address]  Blood Culture (routine x 2)     Status: None (Preliminary result)   Collection Time: 10/15/20  4:07 PM   Specimen: Right Antecubital; Blood  Result Value Ref Range Status   Specimen Description   Final    RIGHT ANTECUBITAL BOTTLES DRAWN AEROBIC AND ANAEROBIC   Special Requests Blood Culture adequate volume  Final   Culture   Final    NO GROWTH 4 DAYS Performed at [facility], [address] [place], [place] [address]    Report Status PENDING  Incomplete  Resp Panel by RT-PCR (Flu A&B, Covid) Nasopharyngeal Swab     Status: None   Collection Time: 10/15/20  5:17 PM   Specimen: Nasopharyngeal Swab; Nasopharyngeal(NP) swabs in vial transport medium  Result Value Ref Range Status   SARS Coronavirus 2 by RT PCR NEGATIVE NEGATIVE Final    Comment: (NOTE) SARS-CoV-2 target nucleic acids are NOT DETECTED.  The SARS-CoV-2 RNA is generally detectable in upper respiratory specimens during the acute phase of infection. The lowest concentration of SARS-CoV-2 viral copies this assay can detect is 138 copies/mL. A negative result does not preclude SARS-Cov-2 infection and should not be used as the sole basis for treatment or other patient management decisions. A negative result may occur with  improper specimen collection/handling, submission of specimen other than nasopharyngeal swab, presence of viral mutation(s) within the areas targeted by this assay, and inadequate number of viral copies(<138 copies/mL). A negative result must be combined with clinical observations, patient history, and epidemiological information. The expected result is  Negative.  Fact Sheet for Patients:  BloggerCourse.comhttps://www.fda.gov/media/152166/download  Fact Sheet for Healthcare Providers:  SeriousBroker.ithttps://www.fda.gov/media/152162/download  This test is no t yet approved or cleared by the [place] FDA and  has been authorized for detection and/or diagnosis of SARS-CoV-2 by FDA under an Emergency Use Authorization (EUA). This EUA will remain  in effect (meaning this test can be used) for the duration of the COVID-19 declaration under Section 564(b)(1) of the Act, 21 U.S.C.section 360bbb-3(b)(1), unless the authorization is terminated  or revoked sooner.       Influenza A by PCR NEGATIVE NEGATIVE Final   Influenza B by PCR NEGATIVE NEGATIVE Final    Comment: (NOTE) The Xpert Xpress SARS-CoV-2/FLU/RSV plus assay is intended as an aid in the diagnosis of influenza from Nasopharyngeal swab specimens and should not be used as a sole basis for treatment. Nasal washings and aspirates are unacceptable for Xpert Xpress SARS-CoV-2/FLU/RSV testing.  Fact Sheet for Patients: BloggerCourse.com  Fact Sheet for Healthcare Providers: SeriousBroker.it  This test is not yet approved or cleared by the [place] FDA and has been authorized for detection and/or diagnosis of SARS-CoV-2 by FDA under an Emergency Use Authorization (EUA). This EUA will remain in effect (meaning this test can be used) for the duration of the COVID-19 declaration under Section 564(b)(1) of the Act, 21 U.S.C. section 360bbb-3(b)(1), unless the authorization is terminated or revoked.  Performed at [facility], [address], [place], [place] [address]   MRSA PCR Screening     Status: None   Collection Time: 10/17/20  2:40 AM   Specimen: Nasal Mucosa; Nasopharyngeal  Result Value Ref Range Status   MRSA by PCR NEGATIVE NEGATIVE Final    Comment:        The GeneXpert MRSA Assay (FDA approved for NASAL specimens only), is one component  of a comprehensive MRSA colonization surveillance program. It is not intended to diagnose MRSA infection nor to guide or monitor treatment for MRSA infections. Performed at [facility], [address] [place], [place] [address]      Scheduled Meds: . busPIRone  20 mg Oral TID  . Chlorhexidine Gluconate Cloth  6 each Topical Daily  . DULoxetine  60 mg Oral Daily  . enoxaparin (LOVENOX) injection  130 mg Subcutaneous Q12H  . feeding supplement (GLUCERNA SHAKE)  237 mL Oral TID BM  . fluticasone furoate-vilanterol  1 puff Inhalation Daily  . insulin aspart  0-20 Units Subcutaneous TID WC  . insulin aspart  0-5 Units Subcutaneous QHS  . insulin aspart  10 Units Subcutaneous TID WC  . insulin glargine  40 Units Subcutaneous QHS  . lidocaine (PF)  10 mL Intradermal Once  . LORazepam  1 mg Intravenous Once  . mouth rinse  15 mL Mouth Rinse BID  . nystatin   Topical BID  . pantoprazole  40 mg Oral Daily  . phenytoin  400 mg Oral Daily  . polyethylene glycol  17 g Oral Daily  . traZODone  50 mg Oral QHS   Continuous Infusions: . ceFEPime (MAXIPIME) IV    . vancomycin 1,250 mg (10/19/20 0911)    Procedures/Studies: CT Head Wo Contrast  Result Date: 10/15/2020 CLINICAL DATA:  Dizziness, syncope EXAM: CT HEAD WITHOUT CONTRAST TECHNIQUE: Contiguous axial images were obtained from the base of the skull through the vertex without intravenous contrast. COMPARISON:  None. FINDINGS: Brain: No acute intracranial abnormality. Specifically, no hemorrhage, hydrocephalus, mass lesion, acute infarction, or significant intracranial injury. Vascular: No hyperdense vessel or unexpected calcification. Skull: No acute calvarial abnormality. Sinuses/Orbits: No acute findings Other: None IMPRESSION: No acute intracranial abnormality. Electronically Signed   By: Charlett Nose M.D.   On: 10/15/2020 18:59   CT CHEST W CONTRAST  Result Date: 10/17/2020 CLINICAL DATA:  Sepsis, atrial fibrillation with rapid  ventricular response EXAM: CT  CHEST WITH CONTRAST TECHNIQUE: Multidetector CT imaging of the chest was performed during intravenous contrast administration. CONTRAST:  75mL OMNIPAQUE IOHEXOL 300 MG/ML  SOLN COMPARISON:  None. FINDINGS: Cardiovascular: Cardiac size within normal limits. Extensive multi-vessel coronary artery calcification, largely within the left anterior descending coronary artery. No pericardial effusion. The central pulmonary arteries are of normal caliber. 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 No aortic aneurysm. Mild atherosclerotic calcification within the descending thoracic aorta. Mediastinum/Nodes: The visualized thyroid is unremarkable. No pathologic thoracic adenopathy. Small hiatal hernia. Esophagus is unremarkable. Lungs/Pleura: Lungs are clear. No pleural effusion or pneumothorax. The central airways are widely patent. Upper Abdomen: Status post cholecystectomy.  No acute abnormality. Musculoskeletal: The osseous structures are age-appropriate. No acute bone abnormality. No lytic or blastic bone lesion. IMPRESSION: No acute intrathoracic pathology identified. No definite radiographic explanation for the patient's reported symptoms. Extensive coronary artery calcification. Small hiatal hernia. Aortic Atherosclerosis (ICD10-I70.0). Electronically Signed   By: Helyn Numbers MD   On: 10/17/2020 02:51   CT ABDOMEN PELVIS W CONTRAST  Result Date: 10/18/2020 CLINICAL DATA:  Abdominal pain. Abdominal wall abscess. Weakness. Fever. Decreased appetite. EXAM: CT ABDOMEN AND PELVIS WITH CONTRAST TECHNIQUE: Multidetector CT imaging of the abdomen and pelvis was performed using the standard protocol following bolus administration of intravenous contrast. CONTRAST:  OMNIPAQUE IOHEXOL 300 MG/ML  SOLN COMPARISON:  Chest CT of 1 day prior FINDINGS: Lower chest: Clear lung bases. Mild cardiomegaly, without pericardial or pleural effusion. Hepatobiliary: Mild hepatomegaly at  greater than 19 cm craniocaudal. Favor focal fat within the posterior aspect of segment 2-3 on 27/2. No suspicious liver lesion. Cholecystectomy, without biliary ductal dilatation. Pancreas: Normal, without mass or ductal dilatation. Spleen: Normal in size, without focal abnormality. Adrenals/Urinary Tract: Minimal left adrenal nodularity. Normal right adrenal gland. Upper and lower pole punctate right renal collecting system calculi. Normal left kidney and urinary bladder. Stomach/Bowel: Normal stomach, without wall thickening. The cecum terminates in the midline. Normal terminal ileum. Normal small bowel. Vascular/Lymphatic: Aortic atherosclerosis. No abdominopelvic adenopathy. Reproductive: Hysterectomy.  No adnexal mass. Other: Anterior abdominal wall subcutaneous edema and minimal gas, including on images 46 and 41 respectively. No well-defined or drainable fluid collection 2 suggest abscess. No fistulous communication to bowel. Other areas of dependent subcutaneous edema are likely due to anasarca. Musculoskeletal: No acute osseous abnormality. Thoracolumbar spondylosis. IMPRESSION: 1. Anterior abdominal wall subcutaneous edema, likely representing cellulitis. Cannot exclude gas-forming organism, given concurrent subcutaneous gas. No well-defined fluid collection to suggest abscess. 2. No explanation for pain. 3. Hepatomegaly. 4. Right nephrolithiasis. Electronically Signed   By: Jeronimo Greaves M.D.   On: 10/18/2020 15:17   DG Chest Port 1 View  Result Date: 10/15/2020 CLINICAL DATA:  Shortness of breath EXAM: PORTABLE CHEST 1 VIEW COMPARISON:  April 10, 2020 FINDINGS: There is no edema or airspace opacity. Heart is mildly enlarged with pulmonary vascularity normal. No adenopathy. There is degenerative change in each shoulder. IMPRESSION: Mild cardiac enlargement.  No edema or airspace opacity. Electronically Signed   By: Bretta Bang III M.D.   On: 10/15/2020 16:44   ECHOCARDIOGRAM  COMPLETE  Result Date: 10/16/2020    ECHOCARDIOGRAM REPORT   Patient Name:   CELISE BAZAR Date of Exam: 10/16/2020 Medical Rec #:  [id number]    Height:       65.0 in Accession #:    [id number]   Weight:       283.1 lb Date of Birth:  Apr 18, 1951    BSA:  2.293 m Patient Age:    69 years     BP:           139/70 mmHg Patient Gender: F            HR:           78 bpm. [facility] Procedure: 2D Echo, Cardiac Doppler and Color Doppler Indications:    Syncope R55  History:        Patient has no prior history of Echocardiogram examinations.                 Risk Factors:Hypertension, Dyslipidemia and Diabetes. Abdominal                 wall abscess, Morbid Obesity.  Sonographer:    Celesta Gentile RCS Referring Phys: [id number] OLADAPO ADEFESO IMPRESSIONS  1. Left ventricular ejection fraction, by estimation, is 65 to 70%. The left ventricle has normal function. The left ventricle has no regional wall motion abnormalities. There is mild left ventricular hypertrophy. Left ventricular diastolic parameters are consistent with Grade I diastolic dysfunction (impaired relaxation).  2. Right ventricular systolic function is normal. The right ventricular size is normal. Tricuspid regurgitation signal is inadequate for assessing PA pressure.  3. Left atrial size was moderately dilated.  4. Right atrial size was mildly dilated.  5. The mitral valve is grossly normal. Trivial mitral valve regurgitation.  6. The aortic valve is tricuspid. There is mild calcification of the aortic valve. Aortic valve regurgitation is not visualized. There is moderate sclerosis to mild aortic valve stenosis. Aortic valve mean gradient measures 12.0 mmHg.  7. The inferior vena cava is normal in size with greater than 50% respiratory variability, suggesting right atrial pressure of 3 mmHg. FINDINGS  Left Ventricle: Left ventricular ejection fraction, by estimation, is 65 to 70%. The left ventricle has normal function. The left ventricle  has no regional wall motion abnormalities. Definity contrast agent was given IV to delineate the left ventricular  endocardial borders. The left ventricular internal cavity size was normal in size. There is mild left ventricular hypertrophy. Left ventricular diastolic parameters are consistent with Grade I diastolic dysfunction (impaired relaxation). Right Ventricle: The right ventricular size is normal. No increase in right ventricular wall thickness. Right ventricular systolic function is normal. Tricuspid regurgitation signal is inadequate for assessing PA pressure. Left Atrium: Left atrial size was moderately dilated. Right Atrium: Right atrial size was mildly dilated. Pericardium: There is no evidence of pericardial effusion. Mitral Valve: The mitral valve is grossly normal. There is mild calcification of the mitral valve leaflet(s). Trivial mitral valve regurgitation. Tricuspid Valve: The tricuspid valve is grossly normal. Tricuspid valve regurgitation is trivial. Aortic Valve: The aortic valve is tricuspid. There is mild calcification of the aortic valve. There is mild aortic valve annular calcification. Aortic valve regurgitation is not visualized. Mild aortic stenosis is present. Aortic valve mean gradient measures 12.0 mmHg. Aortic valve peak gradient measures 24.2 mmHg. Aortic valve area, by VTI measures 1.76 cm. Pulmonic Valve: The pulmonic valve was not well visualized. Pulmonic valve regurgitation is trivial. Aorta: The aortic root is normal in size and structure. Venous: The inferior vena cava is normal in size with greater than 50% respiratory variability, suggesting right atrial pressure of 3 mmHg. IAS/Shunts: No atrial level shunt detected by color flow Doppler.  LEFT VENTRICLE PLAX 2D LVIDd:         4.07 cm  Diastology LVIDs:  2.65 cm  LV e' medial:    7.07 cm/s LV PW:         1.25 cm  LV E/e' medial:  15.0 LV IVS:        1.20 cm  LV e' lateral:   12.50 cm/s LVOT diam:     2.10 cm  LV E/e'  lateral: 8.5 LV SV:         102 LV SV Index:   45 LVOT Area:     3.46 cm  RIGHT VENTRICLE RV S prime:     22.60 cm/s TAPSE (M-mode): 3.3 cm LEFT ATRIUM              Index       RIGHT ATRIUM           Index LA diam:        4.50 cm  1.96 cm/m  RA Area:     25.60 cm LA Vol (A2C):   66.5 ml  29.00 ml/m RA Volume:   86.40 ml  37.68 ml/m LA Vol (A4C):   120.0 ml 52.34 ml/m LA Biplane Vol: 93.5 ml  40.78 ml/m  AORTIC VALVE AV Area (Vmax):    1.87 cm AV Area (Vmean):   1.85 cm AV Area (VTI):     1.76 cm AV Vmax:           246.00 cm/s AV Vmean:          161.000 cm/s AV VTI:            0.582 m AV Peak Grad:      24.2 mmHg AV Mean Grad:      12.0 mmHg LVOT Vmax:         133.00 cm/s LVOT Vmean:        85.900 cm/s LVOT VTI:          0.295 m LVOT/AV VTI ratio: 0.51  AORTA Ao Root diam: 3.30 cm MITRAL VALVE MV Area (PHT): 3.08 cm     SHUNTS MV Decel Time: 246 msec     Systemic VTI:  0.30 m MV E velocity: 106.00 cm/s  Systemic Diam: 2.10 cm MV A velocity: 135.00 cm/s MV E/A ratio:  0.79 Nona Dell MD Electronically signed by Nona Dell MD Signature Date/Time: 10/16/2020/12:37:25 PM    Final     Catarina Hartshorn, DO  [facility]  If 7PM-7AM, please contact night-coverage [email or web address] Password [email or web address] 10/19/2020, 2:54 PM   LOS: 4 days

## 2020-10-20 LAB — BASIC METABOLIC PANEL
Anion gap: 10 (ref 5–15)
BUN: 6 mg/dL — ABNORMAL LOW (ref 8–23)
CO2: 24 mmol/L (ref 22–32)
Calcium: 8.1 mg/dL — ABNORMAL LOW (ref 8.9–10.3)
Chloride: 103 mmol/L (ref 98–111)
Creatinine, Ser: 0.52 mg/dL (ref 0.44–1.00)
GFR, Estimated: >60 mL/min (ref >60)
Glucose, Bld: 175 mg/dL — ABNORMAL HIGH (ref 70–99)
Potassium: 3.8 mmol/L (ref 3.5–5.1)
Sodium: 137 mmol/L (ref 135–145)

## 2020-10-20 LAB — CBC
HCT: 37.8 % (ref 36.0–46.0)
Hemoglobin: 12.2 g/dL (ref 12.0–15.0)
MCH: 29 pg (ref 26.0–34.0)
MCHC: 32.3 g/dL (ref 30.0–36.0)
MCV: 90 fL (ref 80.0–100.0)
Platelets: 301 10*3/uL (ref 150–400)
RBC: 4.2 MIL/uL (ref 3.87–5.11)
RDW: 13.2 % (ref 11.5–15.5)
WBC: 8.2 10*3/uL (ref 4.0–10.5)
nRBC: 0 % (ref 0.0–0.2)

## 2020-10-20 LAB — CULTURE, BLOOD (ROUTINE X 2)
Culture: NO GROWTH
Special Requests: ADEQUATE

## 2020-10-20 LAB — GLUCOSE, CAPILLARY
Glucose-Capillary: 175 mg/dL — ABNORMAL HIGH (ref 70–99)
Glucose-Capillary: 229 mg/dL — ABNORMAL HIGH (ref 70–99)
Glucose-Capillary: 198 mg/dL — ABNORMAL HIGH (ref 70–99)
Glucose-Capillary: 180 mg/dL — ABNORMAL HIGH (ref 70–99)

## 2020-10-20 MED ORDER — WARFARIN - PHARMACIST DOSING INPATIENT: Freq: Every day | Status: DC

## 2020-10-20 MED ORDER — APIXABAN 5 MG PO TABS: 5.0000 mg | ORAL_TABLET | Freq: Two times a day (BID) | ORAL | Status: DC

## 2020-10-20 MED ORDER — WARFARIN SODIUM 5 MG PO TABS
5.0000 mg | ORAL_TABLET | Freq: Once | ORAL | Status: AC
  Administered 2020-10-20: 5 mg via ORAL
  Filled 2020-10-20: qty 1

## 2020-10-20 MED ORDER — ENOXAPARIN SODIUM 150 MG/ML IJ SOSY
140.0000 mg | PREFILLED_SYRINGE | Freq: Two times a day (BID) | INTRAMUSCULAR | Status: DC
  Administered 2020-10-20 – 2020-10-22 (×4): 140 mg via SUBCUTANEOUS
  Filled 2020-10-20 (×4): qty 1

## 2020-10-20 MED ORDER — INSULIN ASPART 100 UNIT/ML IJ SOLN
12.0000 [IU] | Freq: Three times a day (TID) | INTRAMUSCULAR | Status: DC
  Administered 2020-10-20 – 2020-10-21 (×2): 12 [IU] via SUBCUTANEOUS

## 2020-10-20 NOTE — Progress Notes (Addendum)
ANTICOAGULATION CONSULT NOTE -  Pharmacy Consult for Lovenox- bridge with coumadin Indication: atrial fibrillation  Allergies  Allergen Reactions  . Latex Other (See Comments) and Hives  . Penicillins     Tolerated ceftriaxone 10/2020    Patient Measurements: Height: 5\' 5"  (165.1 cm) Weight: (!) 143.9 kg (317 lb 3.9 oz) IBW/kg (Calculated) : 57  Vital Signs: Temp: 99.1 F (37.3 C) (06/08 0516) Temp Source: Oral (06/08 0516) BP: 162/63 (06/08 0516) Pulse Rate: 79 (06/08 0516)  Labs: Recent Labs    10/18/20 0433 10/19/20 0717 10/20/20 0448  HGB 11.8* 11.8* 12.2  HCT 37.2 36.9 37.8  PLT 295 311 301  CREATININE 0.60 0.54 0.52    Estimated Creatinine Clearance: 97.5 mL/min (by C-G formula based on SCr of 0.52 mg/dL).   Medical History: Past Medical History:  Diagnosis Date  . Diabetes mellitus   . Hypercholesteremia   . Hypertension   . Obesity     Assessment: 70 y/o F with abdominal wall abscess. Found to be in atrial fibrillation.  Hgb 11.8. Plts good. Renal function good. PTA meds reviewed.  Now transitioning to Coumadin, bridging with lovenox Unable to use DOAC since on phenytoin  Goal of Therapy:  Monitor platelets by anticoagulation protocol: Yes   Plan:  Lovenox 1mg /kg (140 mg) subcutaneous q12h Coumadin 5mg  po x 1 today Daily PT-INR CBC daily Monitor for bleeding  73, BS , BCPS Clinical Pharmacist Pager [phone]

## 2020-10-20 NOTE — Care Management Important Message (Signed)
Important Message  Patient Details  Name: Donna Snow MRN: [id number] Date of Birth: 1950/07/16   Medicare Important Message Given:  Yes     Corey Harold 10/20/2020, 12:06 PM

## 2020-10-20 NOTE — Progress Notes (Signed)
PROGRESS NOTE   Donna Snow  NWG:[id number] DOB: 01-20-1951 DOA: 10/15/2020 PCP: [facility]   Chief Complaint  Patient presents with  . Abscess    Right upper abd abscess    Level of care: Telemetry  Brief Admission History:  70 year old female with morbid obesity, type 2 diabetes mellitus, hypertension, GERD, chronic back pain presented to the emergency department with an abscess on her ventral abdominal wall status post I&D in the emergency department and is surrounding cellulitis which is currently being treated with IV antibiotic therapy.  Assessment & Plan:   Principal Problem:   Abdominal wall abscess Active Problems:   Abdominal wall cellulitis   SIRS (systemic inflammatory response syndrome) (HCC)   Leukocytosis   Hyponatremia   Hyperglycemia due to diabetes mellitus (HCC)   Hypoalbuminemia due to protein-calorie malnutrition (HCC)   Elevated AST (SGOT)   Essential hypertension   Hyperlipidemia   Syncope and collapse   GERD (gastroesophageal reflux disease)   Chronic back pain   Dehydration   Sepsis due to undetermined organism (HCC)   Uncontrolled type 2 diabetes mellitus with hyperglycemia, with long-term current use of insulin (HCC)   AF (paroxysmal atrial fibrillation) (HCC)   Acute respiratory failure with hypoxia (HCC)   Elevated brain natriuretic peptide (BNP) level   Acute diastolic CHF (congestive heart failure) (HCC)   Sepsis secondary to cellulitis of the ventral abdominal wall-treated with IV antibiotics and IV fluids with improvement and resolution of sepsis physiology.  Continue IV antibiotics for an additional 24 hours and plan to transition to oral antibiotic therapy starting 10/21/2020 and likely discharge home.  Paroxysmal atrial fibrillation with RVR-patient was briefly on IV amiodarone and spontaneously has converted to sinus rhythm.  Consult to pharmacy for starting apixaban for full anticoagulation.  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  Continue basal bolus insulin and follow closely. CBG (last 3)  Recent Labs    10/19/20 2026 10/20/20 0740 10/20/20 1104  GLUCAP 170* 175* 229*   Vasovagal syncope as treated with IV fluid hydration.  PT evaluation requested.  Hyponatremia-resolved  Epilepsy-resume home Dilantin.  Anxiety and depression-continue BuSpar and Cymbalta.  Essential hypertension- blood pressure seems to be controlled continue to follow.  DVT prophylaxis: Enoxaparin transition to apixaban Code Status: Full Family Communication: Plan of care discussed with patient at bedside who verbalized understanding Disposition: Home Status is: Inpatient  Remains inpatient appropriate because:IV treatments appropriate due to intensity of illness or inability to take PO and Inpatient level of care appropriate due to severity of illness   Dispo: The patient is from: Home              Anticipated d/c is to: Home              Patient currently is not medically stable to d/c.   Difficult to place patient No   Consultants:     Procedures:     Antimicrobials:  N/a    Subjective: Pt reports she has a little drainage from wound otherwise no complaints.    Objective: Vitals:   10/19/20 2031 10/20/20 0500 10/20/20 0516 10/20/20 0802  BP: 109/65  (!) 162/63   Pulse: 75  79   Resp: (!) 22  20   Temp: 100.1 F (37.8 C)  99.1 F (37.3 C)   TempSrc: Oral  Oral   SpO2: 96%  97% 94%  Weight:  (!) 143.9 kg    Height:  Intake/Output Summary (Last 24 hours) at 10/20/2020 1331 Last data filed at 10/20/2020 0401 Gross per 24 hour  Intake 650 ml  Output --  Net 650 ml   Filed Weights   10/18/20 0423 10/18/20 1920 10/20/20 0500  Weight: (!) 148.6 kg (!) 147.9 kg (!) 143.9 kg    Examination:  General exam: Mor ob, awake, alert, in bed, Appears calm and comfortable  Respiratory system: Clear to auscultation.  Respiratory effort normal. Cardiovascular system: normal S1 & S2 heard. No JVD, murmurs, rubs, gallops or clicks. No pedal edema. Gastrointestinal system: Abdomen wound is clean and dry. No masses felt. Normal bowel sounds heard. Central nervous system: Alert and oriented. No focal neurological deficits. Extremities: Symmetric 5 x 5 power. Skin: No rashes, lesions or ulcers Psychiatry: Judgement and insight appear normal. Mood & affect appropriate.   Data Reviewed: I have personally reviewed following labs and imaging studies  CBC: Recent Labs  Lab 10/15/20 1557 10/15/20 2225 10/16/20 0604 10/17/20 0336 10/18/20 0433 10/19/20 0717 10/20/20 0448  WBC 12.7*   < > 9.7 10.0 8.6 8.5 8.2  NEUTROABS 9.8*  --   --   --   --   --   --   HGB 13.1   < > 12.6 11.8* 11.8* 11.8* 12.2  HCT 40.5   < > 39.9 36.4 37.2 36.9 37.8  MCV 90.0   < > 91.1 89.7 89.9 90.4 90.0  PLT 248   < > 236 252 295 311 301   < > = values in this interval not displayed.    Basic Metabolic Panel: Recent Labs  Lab 10/16/20 0604 10/17/20 0148 10/17/20 1130 10/18/20 0433 10/19/20 0717 10/20/20 0448  NA 136 134*  --  138 134* 137  K 4.0 3.5  --  3.5 3.6 3.8  CL 103 101  --  103 102 103  CO2 26 24  --  27 25 24   GLUCOSE 304* 261*  --  206* 271* 175*  BUN 10 7*  --  7* 5* 6*  CREATININE 0.68 0.64  --  0.60 0.54 0.52  CALCIUM 8.3* 8.3*  --  8.1* 7.9* 8.1*  MG 1.8  --  1.8 1.7 2.2  --   PHOS 2.3*  --   --   --   --   --     GFR: Estimated Creatinine Clearance: 97.5 mL/min (by C-G formula based on SCr of 0.52 mg/dL).  Liver Function Tests: Recent Labs  Lab 10/15/20 1557 10/16/20 0604  AST 12* 12*  ALT 20 18  ALKPHOS 147* 129*  BILITOT 1.0 1.0  PROT 7.5 6.8  ALBUMIN 3.3* 2.9*    CBG: Recent Labs  Lab 10/19/20 1124 10/19/20 1618 10/19/20 2026 10/20/20 0740 10/20/20 1104  GLUCAP 308* 144* 170* 175* 229*    Recent Results (from the past 240 hour(s))  Urine culture     Status: Abnormal    Collection Time: 10/15/20  3:41 PM   Specimen: In/Out Cath Urine  Result Value Ref Range Status   Specimen Description   Final    IN/OUT CATH URINE Performed at [facility], [address] [place], [place]    Special Requests   Final    NONE Performed at [facility], [address] [place], [place]    Culture MULTIPLE SPECIES PRESENT, SUGGEST RECOLLECTION (A)  Final   Report Status 10/18/2020 FINAL  Final  Blood Culture (routine x 2)     Status: Abnormal  Collection Time: 10/15/20  3:58 PM   Specimen: Left Antecubital; Blood  Result Value Ref Range Status   Specimen Description   Final    LEFT ANTECUBITAL BOTTLES DRAWN AEROBIC AND ANAEROBIC Performed at [facility], [address] [place], [place] [address]    Special Requests   Final    Blood Culture adequate volume Performed at [facility], [address] [place], [place] [address]    Culture  Setup Time   Final    GRAM POSITIVE COCCI AEROBIC BOTTLE ONLY Gram Stain Report Called to,Read Back By and Verified With: COLEMAN @ 1309 ON [id number] BY HENDERSON L. CRITICAL RESULT CALLED TO, READ BACK BY AND VERIFIED WITH: Meryl DareLAUREN COLEMAN RN @1843  10/16/20 EB    Culture (A)  Final    STAPHYLOCOCCUS EPIDERMIDIS THE SIGNIFICANCE OF ISOLATING THIS ORGANISM FROM A SINGLE SET OF BLOOD CULTURES WHEN MULTIPLE SETS ARE DRAWN IS UNCERTAIN. PLEASE NOTIFY THE MICROBIOLOGY DEPARTMENT WITHIN ONE WEEK IF SPECIATION AND SENSITIVITIES ARE REQUIRED. Performed at [facility], [address] [place], [place] [address]    Report Status 10/17/2020 FINAL  Final  Blood Culture ID Panel (Reflexed)     Status: Abnormal   Collection Time: 10/15/20  3:58 PM  Result Value Ref Range Status   Enterococcus faecalis NOT DETECTED NOT DETECTED Final   Enterococcus Faecium NOT DETECTED NOT DETECTED Final   Listeria monocytogenes NOT DETECTED NOT DETECTED Final   Staphylococcus species DETECTED (A) NOT DETECTED Final    Comment: CRITICAL  RESULT CALLED TO, READ BACK BY AND VERIFIED WITH: Meryl DareLAUREN COLEMAN RN @1843  10/16/20 EB    Staphylococcus aureus (BCID) NOT DETECTED NOT DETECTED Final   Staphylococcus epidermidis DETECTED (A) NOT DETECTED Final    Comment: CRITICAL RESULT CALLED TO, READ BACK BY AND VERIFIED WITH: Meryl DareLAUREN COLEMAN RN @1843  10/16/20 EB    Staphylococcus lugdunensis NOT DETECTED NOT DETECTED Final   Streptococcus species NOT DETECTED NOT DETECTED Final   Streptococcus agalactiae NOT DETECTED NOT DETECTED Final   Streptococcus pneumoniae NOT DETECTED NOT DETECTED Final   Streptococcus pyogenes NOT DETECTED NOT DETECTED Final   A.calcoaceticus-baumannii NOT DETECTED NOT DETECTED Final   Bacteroides fragilis NOT DETECTED NOT DETECTED Final   Enterobacterales NOT DETECTED NOT DETECTED Final   Enterobacter cloacae complex NOT DETECTED NOT DETECTED Final   Escherichia coli NOT DETECTED NOT DETECTED Final   Klebsiella aerogenes NOT DETECTED NOT DETECTED Final   Klebsiella oxytoca NOT DETECTED NOT DETECTED Final   Klebsiella pneumoniae NOT DETECTED NOT DETECTED Final   Proteus species NOT DETECTED NOT DETECTED Final   Salmonella species NOT DETECTED NOT DETECTED Final   Serratia marcescens NOT DETECTED NOT DETECTED Final   Haemophilus influenzae NOT DETECTED NOT DETECTED Final   Neisseria meningitidis NOT DETECTED NOT DETECTED Final   Pseudomonas aeruginosa NOT DETECTED NOT DETECTED Final   Stenotrophomonas maltophilia NOT DETECTED NOT DETECTED Final   Candida albicans NOT DETECTED NOT DETECTED Final   Candida auris NOT DETECTED NOT DETECTED Final   Candida glabrata NOT DETECTED NOT DETECTED Final   Candida krusei NOT DETECTED NOT DETECTED Final   Candida parapsilosis NOT DETECTED NOT DETECTED Final   Candida tropicalis NOT DETECTED NOT DETECTED Final   Cryptococcus neoformans/gattii NOT DETECTED NOT DETECTED Final   Methicillin resistance mecA/C NOT DETECTED NOT DETECTED Final    Comment: Performed at [facility], [address] [place], [place] [address]  Blood Culture (routine x 2)     Status: None  Collection Time: 10/15/20  4:07 PM   Specimen: Right Antecubital; Blood  Result Value Ref Range Status   Specimen Description   Final    RIGHT ANTECUBITAL BOTTLES DRAWN AEROBIC AND ANAEROBIC   Special Requests Blood Culture adequate volume  Final   Culture   Final    NO GROWTH 5 DAYS Performed at [facility], [address] [place], [place] [address]    Report Status 10/20/2020 FINAL  Final  Resp Panel by RT-PCR (Flu A&B, Covid) Nasopharyngeal Swab     Status: None   Collection Time: 10/15/20  5:17 PM   Specimen: Nasopharyngeal Swab; Nasopharyngeal(NP) swabs in vial transport medium  Result Value Ref Range Status   SARS Coronavirus 2 by RT PCR NEGATIVE NEGATIVE Final    Comment: (NOTE) SARS-CoV-2 target nucleic acids are NOT DETECTED.  The SARS-CoV-2 RNA is generally detectable in upper respiratory specimens during the acute phase of infection. The lowest concentration of SARS-CoV-2 viral copies this assay can detect is 138 copies/mL. A negative result does not preclude SARS-Cov-2 infection and should not be used as the sole basis for treatment or other patient management decisions. A negative result may occur with  improper specimen collection/handling, submission of specimen other than nasopharyngeal swab, presence of viral mutation(s) within the areas targeted by this assay, and inadequate number of viral copies(<138 copies/mL). A negative result must be combined with clinical observations, patient history, and epidemiological information. The expected result is Negative.  Fact Sheet for Patients:  BloggerCourse.com  Fact Sheet for Healthcare Providers:  SeriousBroker.it  This test is no t yet approved or cleared by the [place] FDA and  has been authorized for detection and/or diagnosis of SARS-CoV-2 by FDA under an  Emergency Use Authorization (EUA). This EUA will remain  in effect (meaning this test can be used) for the duration of the COVID-19 declaration under Section 564(b)(1) of the Act, 21 U.S.C.section 360bbb-3(b)(1), unless the authorization is terminated  or revoked sooner.       Influenza A by PCR NEGATIVE NEGATIVE Final   Influenza B by PCR NEGATIVE NEGATIVE Final    Comment: (NOTE) The Xpert Xpress SARS-CoV-2/FLU/RSV plus assay is intended as an aid in the diagnosis of influenza from Nasopharyngeal swab specimens and should not be used as a sole basis for treatment. Nasal washings and aspirates are unacceptable for Xpert Xpress SARS-CoV-2/FLU/RSV testing.  Fact Sheet for Patients: BloggerCourse.com  Fact Sheet for Healthcare Providers: SeriousBroker.it  This test is not yet approved or cleared by the [place] FDA and has been authorized for detection and/or diagnosis of SARS-CoV-2 by FDA under an Emergency Use Authorization (EUA). This EUA will remain in effect (meaning this test can be used) for the duration of the COVID-19 declaration under Section 564(b)(1) of the Act, 21 U.S.C. section 360bbb-3(b)(1), unless the authorization is terminated or revoked.  Performed at [facility], [address] [place], [place] [address]   MRSA PCR Screening     Status: None   Collection Time: 10/17/20  2:40 AM   Specimen: Nasal Mucosa; Nasopharyngeal  Result Value Ref Range Status   MRSA by PCR NEGATIVE NEGATIVE Final    Comment:        The GeneXpert MRSA Assay (FDA approved for NASAL specimens only), is one component of a comprehensive MRSA colonization surveillance program. It is not intended to diagnose MRSA infection nor to guide or monitor treatment for MRSA infections. Performed at [facility], [address] [place], [place] [address]  Radiology Studies: DG Ankle 2 Views Right  Result Date: 10/19/2020 CLINICAL  DATA:  Right foot ankle pain.  No known injury. EXAM: RIGHT ANKLE - 2 VIEW COMPARISON:  No recent. FINDINGS: Diffuse soft tissue swelling. Mild degenerative change. No acute bony or joint abnormality. No evidence of fracture dislocation. IMPRESSION: Diffuse soft tissue swelling. Degenerative change. No acute bony or joint abnormality. Electronically Signed   By: Maisie Fus  Register   On: 10/19/2020 15:48   CT ABDOMEN PELVIS W CONTRAST  Result Date: 10/18/2020 CLINICAL DATA:  Abdominal pain. Abdominal wall abscess. Weakness. Fever. Decreased appetite. EXAM: CT ABDOMEN AND PELVIS WITH CONTRAST TECHNIQUE: Multidetector CT imaging of the abdomen and pelvis was performed using the standard protocol following bolus administration of intravenous contrast. CONTRAST:  OMNIPAQUE IOHEXOL 300 MG/ML  SOLN COMPARISON:  Chest CT of 1 day prior FINDINGS: Lower chest: Clear lung bases. Mild cardiomegaly, without pericardial or pleural effusion. Hepatobiliary: Mild hepatomegaly at greater than 19 cm craniocaudal. Favor focal fat within the posterior aspect of segment 2-3 on 27/2. No suspicious liver lesion. Cholecystectomy, without biliary ductal dilatation. Pancreas: Normal, without mass or ductal dilatation. Spleen: Normal in size, without focal abnormality. Adrenals/Urinary Tract: Minimal left adrenal nodularity. Normal right adrenal gland. Upper and lower pole punctate right renal collecting system calculi. Normal left kidney and urinary bladder. Stomach/Bowel: Normal stomach, without wall thickening. The cecum terminates in the midline. Normal terminal ileum. Normal small bowel. Vascular/Lymphatic: Aortic atherosclerosis. No abdominopelvic adenopathy. Reproductive: Hysterectomy.  No adnexal mass. Other: Anterior abdominal wall subcutaneous edema and minimal gas, including on images 46 and 41 respectively. No well-defined or drainable fluid collection 2 suggest abscess. No fistulous communication to bowel. Other areas of  dependent subcutaneous edema are likely due to anasarca. Musculoskeletal: No acute osseous abnormality. Thoracolumbar spondylosis. IMPRESSION: 1. Anterior abdominal wall subcutaneous edema, likely representing cellulitis. Cannot exclude gas-forming organism, given concurrent subcutaneous gas. No well-defined fluid collection to suggest abscess. 2. No explanation for pain. 3. Hepatomegaly. 4. Right nephrolithiasis. Electronically Signed   By: Jeronimo Greaves M.D.   On: 10/18/2020 15:17   DG Foot 2 Views Right  Result Date: 10/19/2020 CLINICAL DATA:  Right foot ankle pain.  No known injury. EXAM: RIGHT FOOT - 2 VIEW COMPARISON:  No recent prior. FINDINGS: Diffuse degenerative change. Tiny corticated bony density noted adjacent to the interphalangeal joint right great toe. This is most likely degenerative. No acute bony or joint abnormality identified. No radiopaque foreign body. IMPRESSION: Diffuse degenerative change.  No acute abnormality identified. Electronically Signed   By: Maisie Fus  Register   On: 10/19/2020 15:42    Scheduled Meds: . busPIRone  20 mg Oral TID  . Chlorhexidine Gluconate Cloth  6 each Topical Daily  . DULoxetine  60 mg Oral Daily  . enoxaparin (LOVENOX) injection  130 mg Subcutaneous Q12H  . feeding supplement (GLUCERNA SHAKE)  237 mL Oral TID BM  . fluticasone furoate-vilanterol  1 puff Inhalation Daily  . insulin aspart  0-20 Units Subcutaneous TID WC  . insulin aspart  0-5 Units Subcutaneous QHS  . insulin aspart  10 Units Subcutaneous TID WC  . insulin glargine  40 Units Subcutaneous QHS  . lidocaine (PF)  10 mL Intradermal Once  . LORazepam  1 mg Intravenous Once  . mouth rinse  15 mL Mouth Rinse BID  . nystatin   Topical BID  . pantoprazole  40 mg Oral Daily  . phenytoin  400 mg Oral Daily  . polyethylene glycol  17  g Oral Daily  . traZODone  50 mg Oral QHS   Continuous Infusions: . ceFEPime (MAXIPIME) IV 2 g (10/20/20 0842)  . vancomycin 1,250 mg (10/20/20 0521)     LOS: 5 days   Time spent: 35 mins   Tabytha Gradillas Laural Benes, MD How to contact the [facility] Attending or Consulting provider 7A - 7P or covering provider during after hours 7P -7A, for this patient?  1. Check the care team in [facility] and look for a) attending/consulting TRH provider listed and b) the [facility] team listed 2. Log into [email or web address] and use [place]'s universal password to access. If you do not have the password, please contact the hospital operator. 3. Locate the [facility] provider you are looking for under [facility] and page to a number that you can be directly reached. 4. If you still have difficulty reaching the provider, please page the [facility] (Director on Call) for the Hospitalists listed on amion for assistance.  10/20/2020, 1:31 PM

## 2020-10-21 LAB — CBC
HCT: 36.8 % (ref 36.0–46.0)
Hemoglobin: 11.7 g/dL — ABNORMAL LOW (ref 12.0–15.0)
MCH: 28.7 pg (ref 26.0–34.0)
MCHC: 31.8 g/dL (ref 30.0–36.0)
MCV: 90.4 fL (ref 80.0–100.0)
Platelets: 343 10*3/uL (ref 150–400)
RBC: 4.07 MIL/uL (ref 3.87–5.11)
RDW: 13.2 % (ref 11.5–15.5)
WBC: 7.4 10*3/uL (ref 4.0–10.5)
nRBC: 0 % (ref 0.0–0.2)

## 2020-10-21 LAB — PROTIME-INR
INR: 1.1 (ref 0.8–1.2)
Prothrombin Time: 13.8 seconds (ref 11.4–15.2)

## 2020-10-21 LAB — GLUCOSE, CAPILLARY
Glucose-Capillary: 168 mg/dL — ABNORMAL HIGH (ref 70–99)
Glucose-Capillary: 214 mg/dL — ABNORMAL HIGH (ref 70–99)

## 2020-10-21 MED ORDER — TOPIRAMATE 100 MG PO TABS
200.0000 mg | ORAL_TABLET | Freq: Two times a day (BID) | ORAL | Status: DC
  Administered 2020-10-21 – 2020-10-22 (×3): 200 mg via ORAL
  Filled 2020-10-21 (×3): qty 2

## 2020-10-21 MED ORDER — ROSUVASTATIN CALCIUM 20 MG PO TABS
40.0000 mg | ORAL_TABLET | Freq: Every evening | ORAL | Status: DC
  Administered 2020-10-21: 40 mg via ORAL
  Filled 2020-10-21: qty 2

## 2020-10-21 MED ORDER — DULOXETINE HCL 60 MG PO CPEP
60.0000 mg | ORAL_CAPSULE | Freq: Two times a day (BID) | ORAL | Status: DC
  Administered 2020-10-21 – 2020-10-22 (×2): 60 mg via ORAL
  Filled 2020-10-21 (×2): qty 1

## 2020-10-21 MED ORDER — FUROSEMIDE 40 MG PO TABS
40.0000 mg | ORAL_TABLET | Freq: Every day | ORAL | Status: DC
  Administered 2020-10-21 – 2020-10-22 (×2): 40 mg via ORAL
  Filled 2020-10-21 (×2): qty 1

## 2020-10-21 MED ORDER — INSULIN ASPART 100 UNIT/ML IJ SOLN
14.0000 [IU] | Freq: Three times a day (TID) | INTRAMUSCULAR | Status: DC
  Administered 2020-10-21 – 2020-10-22 (×4): 14 [IU] via SUBCUTANEOUS

## 2020-10-21 MED ORDER — LORATADINE 10 MG PO TABS
10.0000 mg | ORAL_TABLET | Freq: Every day | ORAL | Status: DC
  Administered 2020-10-21 – 2020-10-22 (×2): 10 mg via ORAL
  Filled 2020-10-21 (×2): qty 1

## 2020-10-21 MED ORDER — WARFARIN SODIUM 5 MG PO TABS
5.0000 mg | ORAL_TABLET | Freq: Once | ORAL | Status: AC
  Administered 2020-10-21: 5 mg via ORAL
  Filled 2020-10-21: qty 1

## 2020-10-21 MED ORDER — EZETIMIBE 10 MG PO TABS
10.0000 mg | ORAL_TABLET | Freq: Every day | ORAL | Status: DC
  Administered 2020-10-21 – 2020-10-22 (×2): 10 mg via ORAL
  Filled 2020-10-21 (×2): qty 1

## 2020-10-21 MED ORDER — LORAZEPAM 1 MG PO TABS: 1.0000 mg | ORAL_TABLET | ORAL | Status: DC | PRN

## 2020-10-21 NOTE — Plan of Care (Signed)
  Problem: Acute Rehab PT Goals(only PT should resolve) Goal: Pt Will Go Supine/Side To Sit Outcome: Progressing Flowsheets (Taken 10/21/2020 1220) Pt will go Supine/Side to Sit:  Independently  with modified independence Goal: Patient Will Transfer Sit To/From Stand Outcome: Progressing Flowsheets (Taken 10/21/2020 1220) Patient will transfer sit to/from stand:  Independently  with modified independence Goal: Pt Will Transfer Bed To Chair/Chair To Bed Outcome: Progressing Flowsheets (Taken 10/21/2020 1220) Pt will Transfer Bed to Chair/Chair to Bed: with modified independence Goal: Pt Will Ambulate Outcome: Progressing Flowsheets (Taken 10/21/2020 1220) Pt will Ambulate:  50 feet  with modified independence  with least restrictive assistive device   12:20 PM, 10/21/20 Ocie Bob, MPT Physical Therapist with [facility] [phone] office [phone] mobile phone

## 2020-10-21 NOTE — Evaluation (Signed)
Physical Therapy Evaluation Patient Details Name: Donna Snow MRN: [id number] DOB: Aug 29, 1950 Today's Date: 10/21/2020   History of Present Illness  Donna Snow is a 70 y.o. female with medical history significant for T2DM, hypertension, hyperlipidemia, GERD, chronic back pain obesity who presents to the emergency department due to an abscess in right upper quadrant.  Patient states that symptoms started about 2 weeks ago with a papule, she was using warm compresses and few days after, this became pustular and eventually burst open a few days after with drainage of pus, she continued to use hot compresses and wanted to go to her PCP, but due to transportation issues, she was unable to go.  3 days ago, she became febrile, weak, lost appetite and has progressively became weaker, she endorsed 2-day onset of headache and reported passing out in her kitchen yesterday for a few minutes due to weakness, there was no biting of tongue, bowel or urinary incontinence.  Patient decided to activate EMS today she was taken to the ED for further evaluation and management.  She ambulates with a cane at baseline, she denies chest pain, shortness of breath, diarrhea or constipation.  On review of medical record, it was noted that patient had similar presentation on presenting to [facility] urgent care at [facility] on 11/27/2019 during which an incision and drainage was done and patient was prescribed with antibiotics and pain medication on discharge   Clinical Impression  Patient functioning near baseline for functional mobility and gait demonstrating good return for transfers and taking steps in room without loss of balance, limited mostly due to c/o abdominal pain at wound site and fatigue.  Patient tolerated sitting up at bedside after therapy - nursing staff aware.  Patient will benefit from continued physical therapy in hospital and recommended venue below to increase strength, balance, endurance for safe ADLs and  gait.      Follow Up Recommendations Home health PT;Supervision - Intermittent    Equipment Recommendations  None recommended by PT    Recommendations for Other Services       Precautions / Restrictions Precautions Precautions: None Restrictions Weight Bearing Restrictions: No      Mobility  Bed Mobility Overal bed mobility: Modified Independent             General bed mobility comments: slightly increased time with HOB raised    Transfers Overall transfer level: Modified independent Equipment used: Rolling walker (2 wheeled);None                Ambulation/Gait Ambulation/Gait assistance: Supervision Gait Distance (Feet): 20 Feet Assistive device: Rolling walker (2 wheeled) Gait Pattern/deviations: Decreased step length - right;Decreased step length - left;Decreased stride length Gait velocity: decreased   General Gait Details: slightly labored slow cadence without loss of balance, limited mostly due to abdominal pain at wound site and c/o fatigue  Stairs            Wheelchair Mobility    Modified Rankin (Stroke Patients Only)       Balance Overall balance assessment: Needs assistance Sitting-balance support: Feet supported;No upper extremity supported Sitting balance-Leahy Scale: Good Sitting balance - Comments: seated at EOB   Standing balance support: During functional activity;No upper extremity supported Standing balance-Leahy Scale: Fair Standing balance comment: fair/good using RW                             Pertinent Vitals/Pain Pain Assessment: 0-10 Pain Score: 8  Pain Location: right side of abdomen Pain Descriptors / Indicators: Sore;Aching Pain Intervention(s): Limited activity within patient's tolerance;Monitored during session;Patient requesting pain meds-RN notified    Home Living Family/patient expects to be discharged to:: Private residence Living Arrangements: Alone Available Help at Discharge:  Family;Available PRN/intermittently Type of Home: Mobile home Home Access: Stairs to enter Entrance Stairs-Rails: Left Entrance Stairs-Number of Steps: 5 Home Layout: One level Home Equipment: Walker - 2 wheels;Cane - single point;Shower seat - built in      Prior Function Level of Independence: Independent with assistive device(s)         Comments: household and short distanced community ambulator using SPC or RW, usually ambulates without AD in home     Hand Dominance        Extremity/Trunk Assessment   Upper Extremity Assessment Upper Extremity Assessment: Overall WFL for tasks assessed    Lower Extremity Assessment Lower Extremity Assessment: Generalized weakness    Cervical / Trunk Assessment Cervical / Trunk Assessment: Normal  Communication   Communication: No difficulties  Cognition Arousal/Alertness: Awake/alert Behavior During Therapy: WFL for tasks assessed/performed Overall Cognitive Status: Within Functional Limits for tasks assessed                                        General Comments      Exercises     Assessment/Plan    PT Assessment Patient needs continued PT services  PT Problem List Decreased strength;Decreased activity tolerance;Decreased balance;Decreased mobility       PT Treatment Interventions DME instruction;Gait training;Stair training;Functional mobility training;Therapeutic activities;Therapeutic exercise;Patient/family education;Balance training    PT Goals (Current goals can be found in the Care Plan section)  Acute Rehab PT Goals Patient Stated Goal: return home able to take care of self PT Goal Formulation: With patient Time For Goal Achievement: 10/28/20 Potential to Achieve Goals: Good    Frequency Min 2X/week   Barriers to discharge        Co-evaluation               AM-PAC PT "6 Clicks" Mobility  Outcome Measure Help needed turning from your back to your side while in a flat bed  without using bedrails?: None Help needed moving from lying on your back to sitting on the side of a flat bed without using bedrails?: A Little Help needed moving to and from a bed to a chair (including a wheelchair)?: A Little Help needed standing up from a chair using your arms (e.g., wheelchair or bedside chair)?: None Help needed to walk in hospital room?: A Little Help needed climbing 3-5 steps with a railing? : A Little 6 Click Score: 20    End of Session   Activity Tolerance: Patient tolerated treatment well;Patient limited by fatigue Patient left: in bed;with call bell/phone within reach Nurse Communication: Mobility status PT Visit Diagnosis: Unsteadiness on feet (R26.81);Other abnormalities of gait and mobility (R26.89);Muscle weakness (generalized) (M62.81)    Time: 9211-9417 PT Time Calculation (min) (ACUTE ONLY): 21 min   Charges:   PT Evaluation $PT Eval Moderate Complexity: 1 Mod PT Treatments $Therapeutic Activity: 8-22 mins        12:18 PM, 10/21/20 Ocie Bob, MPT Physical Therapist with [facility] [phone] office [phone] mobile phone

## 2020-10-21 NOTE — Progress Notes (Signed)
PROGRESS NOTE   Donna Snow  ZHY:[id number] DOB: 04/29/1951 DOA: 10/15/2020 PCP: [facility]   Chief Complaint  Patient presents with   Abscess    Right upper abd abscess    Level of care: Telemetry  Brief Admission History:  70 year old female with morbid obesity, type 2 diabetes mellitus, hypertension, GERD, chronic back pain presented to the emergency department with an abscess on her ventral abdominal wall status post I&D in the emergency department and is surrounding cellulitis which is currently being treated with IV antibiotic therapy.  Assessment & Plan:   Principal Problem:   Abdominal wall abscess Active Problems:   Abdominal wall cellulitis   SIRS (systemic inflammatory response syndrome) (HCC)   Leukocytosis   Hyponatremia   Hyperglycemia due to diabetes mellitus (HCC)   Hypoalbuminemia due to protein-calorie malnutrition (HCC)   Elevated AST (SGOT)   Essential hypertension   Hyperlipidemia   Syncope and collapse   GERD (gastroesophageal reflux disease)   Chronic back pain   Dehydration   Sepsis due to undetermined organism (HCC)   Uncontrolled type 2 diabetes mellitus with hyperglycemia, with long-term current use of insulin (HCC)   AF (paroxysmal atrial fibrillation) (HCC)   Acute respiratory failure with hypoxia (HCC)   Elevated brain natriuretic peptide (BNP) level   Acute diastolic CHF (congestive heart failure) (HCC)   Sepsis secondary to cellulitis of the ventral abdominal wall-treated with IV antibiotics and IV fluids with improvement and resolution of sepsis physiology.  Continue IV antibiotics.  Discussed with pharm D, plan to DC home tomorrow on oral omnicef and doxycycline.  Continue wound care and packing of wound.    Paroxysmal atrial fibrillation with RVR-patient was briefly on IV amiodarone and spontaneously has converted to sinus rhythm.  Consult to pharmacy for warfarin, (unable to do NOAC due to being on dilantin), enoxaparin  bridge ordered for now but likely would not have to bridge at home.  Try to arrange home health RN to check wounds and PT/INR.    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  Continue basal bolus insulin and follow closely. CBG (last 3)  Recent Labs    10/20/20 2025 10/21/20 0725 10/21/20 1057  GLUCAP 180* 168* 214*   Vasovagal syncope as treated with IV fluid hydration.  PT evaluation recommending HH PT which will be ordered.   Hyponatremia-resolved  Epilepsy-resume home Dilantin.  Anxiety and depression-continue BuSpar and Cymbalta.  Essential hypertension- blood pressure seems to be controlled continue to follow.  DVT prophylaxis: Enoxaparin transition to apixaban Code Status: Full Family Communication: Plan of care discussed with patient at bedside who verbalized understanding Disposition: Home Status is: Inpatient  Remains inpatient appropriate because:IV treatments appropriate due to intensity of illness or inability to take PO and Inpatient level of care appropriate due to severity of illness   Dispo: The patient is from: Home              Anticipated d/c is to: Home              Patient currently is not medically stable to d/c.   Difficult to place patient No   Consultants:    Procedures:    Antimicrobials:  N/a    Subjective: Pt having tenderness in the area of the wound with a lot of drainage from wound.     Objective: Vitals:   10/20/20 1948 10/21/20 0510 10/21/20 0723 10/21/20 1350  BP: (!) 150/61 Marland Kitchen)  129/57  (!) 121/59  Pulse: 78 81  69  Resp: 16 20  16   Temp: 99.2 F (37.3 C) 99.8 F (37.7 C)  97.6 F (36.4 C)  TempSrc: Oral Oral  Oral  SpO2: 95% 91% 94% 96%  Weight:  (!) 149.4 kg    Height:        Intake/Output Summary (Last 24 hours) at 10/21/2020 1628 Last data filed at 10/21/2020 1351 Gross per 24 hour  Intake 514 ml  Output 1100 ml  Net -586 ml   Filed Weights    10/18/20 1920 10/20/20 0500 10/21/20 0510  Weight: (!) 147.9 kg (!) 143.9 kg (!) 149.4 kg    Examination:  General exam: Mor ob, awake, alert, in bed, Appears calm and comfortable  Respiratory system: Clear to auscultation. Respiratory effort normal. Cardiovascular system: normal S1 & S2 heard. No JVD, murmurs, rubs, gallops or clicks. No pedal edema. Gastrointestinal system: Abdomen wound is packed with thick purulent drainage seen, foul smelling. No masses felt. Normal bowel sounds heard. Central nervous system: Alert and oriented. No focal neurological deficits. Extremities: Symmetric 5 x 5 power. Skin: No rashes, lesions or ulcers Psychiatry: Judgement and insight appear normal. Mood & affect appropriate.   Data Reviewed: I have personally reviewed following labs and imaging studies  CBC: Recent Labs  Lab 10/15/20 1557 10/15/20 2225 10/17/20 0336 10/18/20 0433 10/19/20 0717 10/20/20 0448 10/21/20 0514  WBC 12.7*   < > 10.0 8.6 8.5 8.2 7.4  NEUTROABS 9.8*  --   --   --   --   --   --   HGB 13.1   < > 11.8* 11.8* 11.8* 12.2 11.7*  HCT 40.5   < > 36.4 37.2 36.9 37.8 36.8  MCV 90.0   < > 89.7 89.9 90.4 90.0 90.4  PLT 248   < > 252 295 311 301 343   < > = values in this interval not displayed.    Basic Metabolic Panel: Recent Labs  Lab 10/16/20 0604 10/17/20 0148 10/17/20 1130 10/18/20 0433 10/19/20 0717 10/20/20 0448  NA 136 134*  --  138 134* 137  K 4.0 3.5  --  3.5 3.6 3.8  CL 103 101  --  103 102 103  CO2 26 24  --  27 25 24   GLUCOSE 304* 261*  --  206* 271* 175*  BUN 10 7*  --  7* 5* 6*  CREATININE 0.68 0.64  --  0.60 0.54 0.52  CALCIUM 8.3* 8.3*  --  8.1* 7.9* 8.1*  MG 1.8  --  1.8 1.7 2.2  --   PHOS 2.3*  --   --   --   --   --     GFR: Estimated Creatinine Clearance: 99.9 mL/min (by C-G formula based on SCr of 0.52 mg/dL).  Liver Function Tests: Recent Labs  Lab 10/15/20 1557 10/16/20 0604  AST 12* 12*  ALT 20 18  ALKPHOS 147* 129*  BILITOT  1.0 1.0  PROT 7.5 6.8  ALBUMIN 3.3* 2.9*    CBG: Recent Labs  Lab 10/20/20 1104 10/20/20 1707 10/20/20 2025 10/21/20 0725 10/21/20 1057  GLUCAP 229* 198* 180* 168* 214*    Recent Results (from the past 240 hour(s))  Urine culture     Status: Abnormal   Collection Time: 10/15/20  3:41 PM   Specimen: In/Out Cath Urine  Result Value Ref Range Status   Specimen Description   Final    IN/OUT CATH URINE Performed at  [facility], [address] [place], [place] [address]    Special Requests   Final    NONE Performed at [facility], [address], [place], [place] [address]    Culture MULTIPLE SPECIES PRESENT, SUGGEST RECOLLECTION (A)  Final   Report Status 10/18/2020 FINAL  Final  Blood Culture (routine x 2)     Status: Abnormal   Collection Time: 10/15/20  3:58 PM   Specimen: Left Antecubital; Blood  Result Value Ref Range Status   Specimen Description   Final    LEFT ANTECUBITAL BOTTLES DRAWN AEROBIC AND ANAEROBIC Performed at [facility], [address] [place], [place] [address]    Special Requests   Final    Blood Culture adequate volume Performed at [facility], [address] [place], [place] [address]    Culture  Setup Time   Final    GRAM POSITIVE COCCI AEROBIC BOTTLE ONLY Gram Stain Report Called to,Read Back By and Verified With: COLEMAN @ 1309 ON [id number] BY HENDERSON L. CRITICAL RESULT CALLED TO, READ BACK BY AND VERIFIED WITH: Meryl Dare RN @1843  10/16/20 EB    Culture (A)  Final    STAPHYLOCOCCUS EPIDERMIDIS THE SIGNIFICANCE OF ISOLATING THIS ORGANISM FROM A SINGLE SET OF BLOOD CULTURES WHEN MULTIPLE SETS ARE DRAWN IS UNCERTAIN. PLEASE NOTIFY THE MICROBIOLOGY DEPARTMENT WITHIN ONE WEEK IF SPECIATION AND SENSITIVITIES ARE REQUIRED. Performed at [facility], [address], [place], [place]    Report Status 10/17/2020 FINAL  Final  Blood Culture ID Panel (Reflexed)     Status: Abnormal   Collection Time: 10/15/20  3:58 PM  Result  Value Ref Range Status   Enterococcus faecalis NOT DETECTED NOT DETECTED Final   Enterococcus Faecium NOT DETECTED NOT DETECTED Final   Listeria monocytogenes NOT DETECTED NOT DETECTED Final   Staphylococcus species DETECTED (A) NOT DETECTED Final    Comment: CRITICAL RESULT CALLED TO, READ BACK BY AND VERIFIED WITH: 12/15/20 RN @1843  10/16/20 EB    Staphylococcus aureus (BCID) NOT DETECTED NOT DETECTED Final   Staphylococcus epidermidis DETECTED (A) NOT DETECTED Final    Comment: CRITICAL RESULT CALLED TO, READ BACK BY AND VERIFIED WITH: RN @1843  10/16/20 EB    Staphylococcus lugdunensis NOT DETECTED NOT DETECTED Final   Streptococcus species NOT DETECTED NOT DETECTED Final   Streptococcus agalactiae NOT DETECTED NOT DETECTED Final   Streptococcus pneumoniae NOT DETECTED NOT DETECTED Final   Streptococcus pyogenes NOT DETECTED NOT DETECTED Final   A.calcoaceticus-baumannii NOT DETECTED NOT DETECTED Final   Bacteroides fragilis NOT DETECTED NOT DETECTED Final   Enterobacterales NOT DETECTED NOT DETECTED Final   Enterobacter cloacae complex NOT DETECTED NOT DETECTED Final   Escherichia coli NOT DETECTED NOT DETECTED Final   Klebsiella aerogenes NOT DETECTED NOT DETECTED Final   Klebsiella oxytoca NOT DETECTED NOT DETECTED Final   Klebsiella pneumoniae NOT DETECTED NOT DETECTED Final   Proteus species NOT DETECTED NOT DETECTED Final   Salmonella species NOT DETECTED NOT DETECTED Final   Serratia marcescens NOT DETECTED NOT DETECTED Final   Haemophilus influenzae NOT DETECTED NOT DETECTED Final   Neisseria meningitidis NOT DETECTED NOT DETECTED Final   Pseudomonas aeruginosa NOT DETECTED NOT DETECTED Final   Stenotrophomonas maltophilia NOT DETECTED NOT DETECTED Final   Candida albicans NOT DETECTED NOT DETECTED Final   Candida auris NOT DETECTED NOT DETECTED Final   Candida glabrata NOT DETECTED NOT DETECTED Final   Candida krusei NOT DETECTED NOT DETECTED Final  Candida parapsilosis NOT DETECTED NOT DETECTED Final   Candida tropicalis NOT DETECTED NOT DETECTED Final   Cryptococcus neoformans/gattii NOT DETECTED NOT DETECTED Final   Methicillin resistance mecA/C NOT DETECTED NOT DETECTED Final    Comment: Performed at [facility], [address] [place], [place] [address]  Blood Culture (routine x 2)     Status: None   Collection Time: 10/15/20  4:07 PM   Specimen: Right Antecubital; Blood  Result Value Ref Range Status   Specimen Description   Final    RIGHT ANTECUBITAL BOTTLES DRAWN AEROBIC AND ANAEROBIC   Special Requests Blood Culture adequate volume  Final   Culture   Final    NO GROWTH 5 DAYS Performed at [facility], [address] [place], [place] [address]    Report Status 10/20/2020 FINAL  Final  Resp Panel by RT-PCR (Flu A&B, Covid) Nasopharyngeal Swab     Status: None   Collection Time: 10/15/20  5:17 PM   Specimen: Nasopharyngeal Swab; Nasopharyngeal(NP) swabs in vial transport medium  Result Value Ref Range Status   SARS Coronavirus 2 by RT PCR NEGATIVE NEGATIVE Final    Comment: (NOTE) SARS-CoV-2 target nucleic acids are NOT DETECTED.  The SARS-CoV-2 RNA is generally detectable in upper respiratory specimens during the acute phase of infection. The lowest concentration of SARS-CoV-2 viral copies this assay can detect is 138 copies/mL. A negative result does not preclude SARS-Cov-2 infection and should not be used as the sole basis for treatment or other patient management decisions. A negative result may occur with  improper specimen collection/handling, submission of specimen other than nasopharyngeal swab, presence of viral mutation(s) within the areas targeted by this assay, and inadequate number of viral copies(<138 copies/mL). A negative result must be combined with clinical observations, patient history, and epidemiological information. The expected result is Negative.  Fact Sheet for Patients:   BloggerCourse.comhttps://www.fda.gov/media/152166/download  Fact Sheet for Healthcare Providers:  SeriousBroker.ithttps://www.fda.gov/media/152162/download  This test is no t yet approved or cleared by the [place] FDA and  has been authorized for detection and/or diagnosis of SARS-CoV-2 by FDA under an Emergency Use Authorization (EUA). This EUA will remain  in effect (meaning this test can be used) for the duration of the COVID-19 declaration under Section 564(b)(1) of the Act, 21 U.S.C.section 360bbb-3(b)(1), unless the authorization is terminated  or revoked sooner.       Influenza A by PCR NEGATIVE NEGATIVE Final   Influenza B by PCR NEGATIVE NEGATIVE Final    Comment: (NOTE) The Xpert Xpress SARS-CoV-2/FLU/RSV plus assay is intended as an aid in the diagnosis of influenza from Nasopharyngeal swab specimens and should not be used as a sole basis for treatment. Nasal washings and aspirates are unacceptable for Xpert Xpress SARS-CoV-2/FLU/RSV testing.  Fact Sheet for Patients: BloggerCourse.comhttps://www.fda.gov/media/152166/download  Fact Sheet for Healthcare Providers: SeriousBroker.ithttps://www.fda.gov/media/152162/download  This test is not yet approved or cleared by the [place] FDA and has been authorized for detection and/or diagnosis of SARS-CoV-2 by FDA under an Emergency Use Authorization (EUA). This EUA will remain in effect (meaning this test can be used) for the duration of the COVID-19 declaration under Section 564(b)(1) of the Act, 21 U.S.C. section 360bbb-3(b)(1), unless the authorization is terminated or revoked.  Performed at [facility], [address] [place], [place] [address]   MRSA PCR Screening     Status: None   Collection Time: 10/17/20  2:40 AM   Specimen: Nasal Mucosa; Nasopharyngeal  Result Value Ref Range Status  MRSA by PCR NEGATIVE NEGATIVE Final    Comment:        The GeneXpert MRSA Assay (FDA approved for NASAL specimens only), is one component of a comprehensive MRSA  colonization surveillance program. It is not intended to diagnose MRSA infection nor to guide or monitor treatment for MRSA infections. Performed at [facility], [address] [place], [place] [address]      Radiology Studies: No results found.   Scheduled Meds:  busPIRone  20 mg Oral TID   Chlorhexidine Gluconate Cloth  6 each Topical Daily   DULoxetine  60 mg Oral BID   enoxaparin (LOVENOX) injection  140 mg Subcutaneous Q12H   ezetimibe  10 mg Oral Daily   feeding supplement (GLUCERNA SHAKE)  237 mL Oral TID BM   fluticasone furoate-vilanterol  1 puff Inhalation Daily   furosemide  40 mg Oral Daily   insulin aspart  0-20 Units Subcutaneous TID WC   insulin aspart  0-5 Units Subcutaneous QHS   insulin aspart  14 Units Subcutaneous TID WC   insulin glargine  40 Units Subcutaneous QHS   lidocaine (PF)  10 mL Intradermal Once   loratadine  10 mg Oral Daily   LORazepam  1 mg Intravenous Once   mouth rinse  15 mL Mouth Rinse BID   nystatin   Topical BID   pantoprazole  40 mg Oral Daily   phenytoin  400 mg Oral Daily   polyethylene glycol  17 g Oral Daily   rosuvastatin  40 mg Oral QPM   topiramate  200 mg Oral BID   traZODone  50 mg Oral QHS   warfarin  5 mg Oral ONCE-1600   Warfarin - Pharmacist Dosing Inpatient   Does not apply q1600   Continuous Infusions:  ceFEPime (MAXIPIME) IV 2 g (10/21/20 0828)    LOS: 6 days   Time spent: 35 mins   Alaine Loughney Laural Benes, MD How to contact the [facility] Attending or Consulting provider 7A - 7P or covering provider during after hours 7P -7A, for this patient?  Check the care team in [facility] and look for a) attending/consulting TRH provider listed and b) the [facility] team listed Log into [email or web address] and use [facility]'s universal password to access. If you do not have the password, please contact the hospital operator. Locate the [facility] provider you are looking for under [facility] and page to a number that you can be directly  reached. If you still have difficulty reaching the provider, please page the [facility] (Director on Call) for the Hospitalists listed on amion for assistance.  10/21/2020, 4:28 PM

## 2020-10-21 NOTE — Progress Notes (Signed)
ANTICOAGULATION CONSULT NOTE -  Pharmacy Consult for Lovenox- bridge with coumadin Indication: atrial fibrillation  Allergies  Allergen Reactions   Latex Other (See Comments) and Hives   Penicillins     Tolerated ceftriaxone 10/2020    Patient Measurements: Height: 5\' 5"  (165.1 cm) Weight: (!) 149.4 kg (329 lb 5.9 oz) IBW/kg (Calculated) : 57  Vital Signs: Temp: 99.8 F (37.7 C) (06/09 0510) Temp Source: Oral (06/09 0510) BP: 129/57 (06/09 0510) Pulse Rate: 81 (06/09 0510)  Labs: Recent Labs    10/19/20 0717 10/20/20 0448 10/21/20 0514  HGB 11.8* 12.2 11.7*  HCT 36.9 37.8 36.8  PLT 311 301 343  LABPROT  --   --  13.8  INR  --   --  1.1  CREATININE 0.54 0.52  --      Estimated Creatinine Clearance: 99.9 mL/min (by C-G formula based on SCr of 0.52 mg/dL).   Medical History: Past Medical History:  Diagnosis Date   Diabetes mellitus    Hypercholesteremia    Hypertension    Obesity     Assessment: 70 y/o F with abdominal wall abscess. Found to be in atrial fibrillation.  Hgb 11.8. Plts good. Renal function good. PTA meds reviewed.  Now transitioning to Coumadin, bridging with lovenox Unable to use DOAC since on phenytoin  INR 1.1 today  Goal of Therapy:  Monitor platelets by anticoagulation protocol: Yes   Plan:  Lovenox 1mg /kg (140 mg) subcutaneous q12h Coumadin 5mg  po x 1 today Daily PT-INR CBC daily Monitor for bleeding  73, BS , BCPS Clinical Pharmacist Pager [phone]

## 2020-10-22 LAB — PROTIME-INR
INR: 1.2 (ref 0.8–1.2)
Prothrombin Time: 15.1 seconds (ref 11.4–15.2)

## 2020-10-22 LAB — CBC
HCT: 39.2 % (ref 36.0–46.0)
Hemoglobin: 12.3 g/dL (ref 12.0–15.0)
MCH: 28.4 pg (ref 26.0–34.0)
MCHC: 31.4 g/dL (ref 30.0–36.0)
MCV: 90.5 fL (ref 80.0–100.0)
Platelets: 325 10*3/uL (ref 150–400)
RBC: 4.33 MIL/uL (ref 3.87–5.11)
RDW: 13.2 % (ref 11.5–15.5)
WBC: 6.9 10*3/uL (ref 4.0–10.5)
nRBC: 0 % (ref 0.0–0.2)

## 2020-10-22 LAB — GLUCOSE, CAPILLARY
Glucose-Capillary: 210 mg/dL — ABNORMAL HIGH (ref 70–99)
Glucose-Capillary: 205 mg/dL — ABNORMAL HIGH (ref 70–99)
Glucose-Capillary: 196 mg/dL — ABNORMAL HIGH (ref 70–99)
Glucose-Capillary: 184 mg/dL — ABNORMAL HIGH (ref 70–99)

## 2020-10-22 LAB — CREATININE, SERUM
Creatinine, Ser: 0.54 mg/dL (ref 0.44–1.00)
GFR, Estimated: >60 mL/min (ref >60)

## 2020-10-22 MED ORDER — WARFARIN SODIUM 7.5 MG PO TABS: 7.5000 mg | ORAL_TABLET | Freq: Once | ORAL | Status: DC

## 2020-10-22 MED ORDER — IBU 800 MG PO TABS: 800.0000 mg | ORAL_TABLET | Freq: Three times a day (TID) | ORAL | 0 refills | Status: DC | PRN

## 2020-10-22 MED ORDER — CEFDINIR 300 MG PO CAPS: 600.0000 mg | ORAL_CAPSULE | Freq: Every day | ORAL | 0 refills | Status: AC

## 2020-10-22 MED ORDER — WARFARIN SODIUM 7.5 MG PO TABS: 7.5000 mg | ORAL_TABLET | Freq: Every day | ORAL | 0 refills | Status: DC

## 2020-10-22 MED ORDER — DOXYCYCLINE HYCLATE 100 MG PO CAPS: 100.0000 mg | ORAL_CAPSULE | Freq: Two times a day (BID) | ORAL | 0 refills | Status: AC

## 2020-10-22 NOTE — Progress Notes (Signed)
ANTICOAGULATION CONSULT NOTE -  Pharmacy Consult for coumadin Indication: atrial fibrillation  Allergies  Allergen Reactions   Latex Other (See Comments) and Hives   Penicillins     Tolerated ceftriaxone 10/2020    Patient Measurements: Height: 5\' 5"  (165.1 cm) Weight: (!) 149.4 kg (329 lb 5.9 oz) IBW/kg (Calculated) : 57  Vital Signs: Temp: 98.2 F (36.8 C) (06/10 0541) Temp Source: Oral (06/10 0541) BP: 147/69 (06/10 0541) Pulse Rate: 73 (06/10 0541)  Labs: Recent Labs    10/20/20 0448 10/21/20 0514 10/22/20 0632  HGB 12.2 11.7* 12.3  HCT 37.8 36.8 39.2  PLT 301 343 325  LABPROT  --  13.8 15.1  INR  --  1.1 1.2  CREATININE 0.52  --  0.54     Estimated Creatinine Clearance: 99.9 mL/min (by C-G formula based on SCr of 0.54 mg/dL).   Medical History: Past Medical History:  Diagnosis Date   Diabetes mellitus    Hypercholesteremia    Hypertension    Obesity     Assessment: 70 y/o F with abdominal wall abscess. Found to be in atrial fibrillation.  Hgb 11.8. Plts good. Renal function good. PTA meds reviewed.  Now transitioning to Coumadin, Unable to use DOAC since on phenytoin  Per cardiology, bridging with lovenox wont be necessary  INR 1.1> 1.2  Goal of Therapy:  Monitor platelets by anticoagulation protocol: Yes   Plan:  Coumadin 7.5mg  daily x 3 days  INR check Monday 6/13 CBC daily Monitor for bleeding  7/13, PharmD Clinical Pharmacist 10/22/2020 10:58 AM

## 2020-10-22 NOTE — Care Management Important Message (Signed)
Important Message  Patient Details  Name: Donna Snow MRN: [id number] Date of Birth: [birth year]   Medicare Important Message Given:  Yes     Corey Harold 10/22/2020, 9:24 AM

## 2020-10-22 NOTE — TOC Initial Note (Signed)
Transition of Care [facility]) - Initial/Assessment Note    Patient Details  Name: Donna Snow MRN: [id number] Date of Birth: 09-02-1950  [facility] CM/SW Contact:    Annice Needy, LCSW Phone Number: 10/22/2020, 1:46 PM  Clinical Narrative:                 Patient from home where a friend lives with her. Patient needs assistance with transportation to [place] at [facility] to pick up antibiotics and then home.  TOC arranged transportation with [facility] [employer] to go pick up medications and then home. [facility] will go in [employer] and get customer scooter, bring scooter to car for patient to get medication. Patient also provided bariatric walker.  TOC signing off.   Expected Discharge Plan: Home w Home Health Services Barriers to Discharge: No Barriers Identified   Patient Goals and CMS Choice Patient states their goals for this hospitalization and ongoing recovery are:: return home   Choice offered to / list presented to : Patient  Expected Discharge Plan and Services Expected Discharge Plan: Home w Home Health Services     Post Acute Care Choice: Durable Medical Equipment, Home Health Living arrangements for the past 2 months: Single Family Home Expected Discharge Date: 10/22/20               DME Arranged: Dan Humphreys rolling (bariatric) DME Agency: [employer] Date DME Agency Contacted: 10/22/20 Time DME Agency Contacted: 1343   HH Arranged: RN, PT HH Agency: [facility] Date HH Agency Contacted: 10/22/20 Time HH Agency Contacted: 1345 Representative spoke with at [facility] Agency: Kandee Keen  Prior Living Arrangements/Services Living arrangements for the past 2 months: Single Family Home Lives with:: Friends Patient language and need for interpreter reviewed:: Yes Do you feel safe going back to the place where you live?: Yes      Need for Family Participation in Patient Care: Yes (Comment) Care giver support system in place?: Yes  (comment)   Criminal Activity/Legal Involvement Pertinent to Current Situation/Hospitalization: No - Comment as needed  Activities of Daily Living Home Assistive Devices/Equipment: CBG Meter ADL Screening (condition at time of admission) Patient's cognitive ability adequate to safely complete daily activities?: Yes Is the patient deaf or have difficulty hearing?: No Does the patient have difficulty seeing, even when wearing glasses/contacts?: No Does the patient have difficulty concentrating, remembering, or making decisions?: No Patient able to express need for assistance with ADLs?: Yes Does the patient have difficulty dressing or bathing?: Yes Independently performs ADLs?: No Communication: Independent Does the patient have difficulty walking or climbing stairs?: Yes Weakness of Legs: Both Weakness of Arms/Hands: None  Permission Sought/Granted                  Emotional Assessment     Affect (typically observed): Appropriate Orientation: : Oriented to Self, Oriented to Place, Oriented to  Time, Oriented to Situation Alcohol / Substance Use: Not Applicable    Admission diagnosis:  Abdominal wall abscess [L02.211] Hyperglycemia [R73.9] Abdominal wall cellulitis [L03.311] Patient Active Problem List   Diagnosis Date Noted   AF (paroxysmal atrial fibrillation) (HCC) 10/17/2020   Acute respiratory failure with hypoxia (HCC) 10/17/2020   Elevated brain natriuretic peptide (BNP) level 10/17/2020   Acute diastolic CHF (congestive heart failure) (HCC) 10/17/2020   Sepsis due to undetermined organism (HCC) 10/16/2020   Uncontrolled type 2 diabetes mellitus with hyperglycemia, with long-term current use of insulin (HCC) 10/16/2020   Abdominal wall cellulitis 10/15/2020  Abdominal wall abscess 10/15/2020   SIRS (systemic inflammatory response syndrome) (HCC) 10/15/2020   Leukocytosis 10/15/2020   Hyponatremia 10/15/2020   Hyperglycemia due to diabetes mellitus (HCC)  10/15/2020   Hypoalbuminemia due to protein-calorie malnutrition (HCC) 10/15/2020   Elevated AST (SGOT) 10/15/2020   Essential hypertension 10/15/2020   Hyperlipidemia 10/15/2020   Syncope and collapse 10/15/2020   GERD (gastroesophageal reflux disease) 10/15/2020   Chronic back pain 10/15/2020   Dehydration 10/15/2020   PCP:  [facility] Pharmacy:   [id number] - [place] ([place]), [place] - [address] [place] [address] Phone: [phone] Fax: [phone]     Social Determinants of Health (SDOH) Interventions    Readmission Risk Interventions No flowsheet data found.

## 2020-10-22 NOTE — Progress Notes (Signed)
Discharge instructions reviewed with patient.  Discussed home health and patient's home meds returned to patient. Patient verbalized understanding of instructions. Patient discharged home via [employer].

## 2020-10-22 NOTE — Discharge Instructions (Addendum)
Continue packing the wound twice daily until it closes.    Home health RN to check PT/INR on 6/13, 6/15, 6/17 and send results to PCP.   Please go to your PCP and have your PT/INR tested early next week.       IMPORTANT INFORMATION: PAY CLOSE ATTENTION   PHYSICIAN DISCHARGE INSTRUCTIONS  Follow with Primary care provider  [facility]  and other consultants as instructed by your Hospitalist Physician  SEEK MEDICAL CARE OR RETURN TO EMERGENCY ROOM IF SYMPTOMS COME BACK, WORSEN OR NEW PROBLEM DEVELOPS   Please note: You were cared for by a hospitalist during your hospital stay. Every effort will be made to forward records to your primary care provider.  You can request that your primary care provider send for your hospital records if they have not received them.  Once you are discharged, your primary care physician will handle any further medical issues. Please note that NO REFILLS for any discharge medications will be authorized once you are discharged, as it is imperative that you return to your primary care physician (or establish a relationship with a primary care physician if you do not have one) for your post hospital discharge needs so that they can reassess your need for medications and monitor your lab values.  Please get a complete blood count and chemistry panel checked by your Primary MD at your next visit, and again as instructed by your Primary MD.  Get Medicines reviewed and adjusted: Please take all your medications with you for your next visit with your Primary MD  Laboratory/radiological data: Please request your Primary MD to go over all hospital tests and procedure/radiological results at the follow up, please ask your primary care provider to get all Hospital records sent to his/her office.  In some cases, they will be blood work, cultures and biopsy results pending at the time of your discharge. Please request that your primary care provider follow up  on these results.  If you are diabetic, please bring your blood sugar readings with you to your follow up appointment with primary care.    Please call and make your follow up appointments as soon as possible.    Also Note the following: If you experience worsening of your admission symptoms, develop shortness of breath, life threatening emergency, suicidal or homicidal thoughts you must seek medical attention immediately by calling 911 or calling your MD immediately  if symptoms less severe.  You must read complete instructions/literature along with all the possible adverse reactions/side effects for all the Medicines you take and that have been prescribed to you. Take any new Medicines after you have completely understood and accpet all the possible adverse reactions/side effects.   Do not drive when taking Pain medications or sleeping medications (Benzodiazepines)  Do not take more than prescribed Pain, Sleep and Anxiety Medications. It is not advisable to combine anxiety,sleep and pain medications without talking with your primary care practitioner  Special Instructions: If you have smoked or chewed Tobacco  in the last 2 yrs please stop smoking, stop any regular Alcohol  and or any Recreational drug use.  Wear Seat belts while driving.  Do not drive if taking any narcotic, mind altering or controlled substances or recreational drugs or alcohol.    Information on my medicine - Coumadin   (Warfarin)  This medication education was reviewed with me or my healthcare representative as part of my discharge preparation.    Why was Coumadin prescribed for you?  Coumadin was prescribed for you because you have a blood clot or a medical condition that can cause an increased risk of forming blood clots. Blood clots can cause serious health problems by blocking the flow of blood to the heart, lung, or brain. Coumadin can prevent harmful blood clots from forming. As a reminder your indication for  Coumadin is:   Select from menu  What test will check on my response to Coumadin? While on Coumadin (warfarin) you will need to have an INR test regularly to ensure that your dose is keeping you in the desired range. The INR (international normalized ratio) number is calculated from the result of the laboratory test called prothrombin time (PT).  If an INR APPOINTMENT HAS NOT ALREADY BEEN MADE FOR YOU please schedule an appointment to have this lab work done by your health care provider within 7 days. Your INR goal is usually a number between:  2 to 3 or your provider may give you a more narrow range like 2-2.5.  Ask your health care provider during an office visit what your goal INR is.  What  do you need to  know  About  COUMADIN? Take Coumadin (warfarin) exactly as prescribed by your healthcare provider about the same time each day.  DO NOT stop taking without talking to the doctor who prescribed the medication.  Stopping without other blood clot prevention medication to take the place of Coumadin may increase your risk of developing a new clot or stroke.  Get refills before you run out.  What do you do if you miss a dose? If you miss a dose, take it as soon as you remember on the same day then continue your regularly scheduled regimen the next day.  Do not take two doses of Coumadin at the same time.  Important Safety Information A possible side effect of Coumadin (Warfarin) is an increased risk of bleeding. You should call your healthcare provider right away if you experience any of the following: Bleeding from an injury or your nose that does not stop. Unusual colored urine (red or dark brown) or unusual colored stools (red or black). Unusual bruising for unknown reasons. A serious fall or if you hit your head (even if there is no bleeding).  Some foods or medicines interact with Coumadin (warfarin) and might alter your response to warfarin. To help avoid this: Eat a balanced diet,  maintaining a consistent amount of Vitamin K. Notify your provider about major diet changes you plan to make. Avoid alcohol or limit your intake to 1 drink for women and 2 drinks for men per day. (1 drink is 5 oz. wine, 12 oz. beer, or 1.5 oz. liquor.)  Make sure that ANY health care provider who prescribes medication for you knows that you are taking Coumadin (warfarin).  Also make sure the healthcare provider who is monitoring your Coumadin knows when you have started a new medication including herbals and non-prescription products.  Coumadin (Warfarin)  Major Drug Interactions  Increased Warfarin Effect Decreased Warfarin Effect  Alcohol (large quantities) Antibiotics (esp. Septra/Bactrim, Flagyl, Cipro) Amiodarone (Cordarone) Aspirin (ASA) Cimetidine (Tagamet) Megestrol (Megace) NSAIDs (ibuprofen, naproxen, etc.) Piroxicam (Feldene) Propafenone (Rythmol SR) Propranolol (Inderal) Isoniazid (INH) Posaconazole (Noxafil) Barbiturates (Phenobarbital) Carbamazepine (Tegretol) Chlordiazepoxide (Librium) Cholestyramine (Questran) Griseofulvin Oral Contraceptives Rifampin Sucralfate (Carafate) Vitamin K   Coumadin (Warfarin) Major Herbal Interactions  Increased Warfarin Effect Decreased Warfarin Effect  Garlic Ginseng Ginkgo biloba Coenzyme Q10 Green tea St. John's wort    Coumadin (Warfarin)  FOOD Interactions  Eat a consistent number of servings per week of foods HIGH in Vitamin K (1 serving =  cup)  Collards (cooked, or boiled & drained) Kale (cooked, or boiled & drained) Mustard greens (cooked, or boiled & drained) Parsley *serving size only =  cup Spinach (cooked, or boiled & drained) Swiss chard (cooked, or boiled & drained) Turnip greens (cooked, or boiled & drained)  Eat a consistent number of servings per week of foods MEDIUM-HIGH in Vitamin K (1 serving = 1 cup)  Asparagus (cooked, or boiled & drained) Broccoli (cooked, boiled & drained, or raw &  chopped) Brussel sprouts (cooked, or boiled & drained) *serving size only =  cup Lettuce, raw (green leaf, endive, romaine) Spinach, raw Turnip greens, raw & chopped   These websites have more information on Coumadin (warfarin):  http://www.king-russell.com/; https://www.hines.net/;

## 2020-10-22 NOTE — Discharge Summary (Signed)
Physician Discharge Summary  Donna Snow:[id number] DOB: 20-Jun-1950 DOA: 10/15/2020  PCP: [facility]  Admit date: 10/15/2020 Discharge date: 10/22/2020  Admitted From:  Home  Disposition: Home with Home health   Recommendations for Outpatient Follow-up:  Follow up with PCP early next week to have PT/INR tested Please adjust warfarin dose as needed for therapeutic INR goals 2-3. Home health RN to check INR 6/13, 6/15, 6/17 and send results to PCP Please continue twice daily packing wound and dressing changes.  Please follow PT/INR regularly.  Please check CBC in 1-2 weeks.   Home Health: PT, RN (check PT/INR 6/13, 6/15, 6/17 and report results to PCP)  Discharge Condition: stable   CODE STATUS: Full  DIET: vitamin K consistent, heart healthy, carb modified   Brief Hospitalization Summary: Please see all hospital notes, images, labs for full details of the hospitalization. ADMISSION HPI: Donna Snow is a 70 y.o. female with medical history significant for T2DM, hypertension, hyperlipidemia, GERD, chronic back pain obesity who presents to the emergency department due to an abscess in right upper quadrant.  Patient states that symptoms started about 2 weeks ago with a papule, she was using warm compresses and few days after, this became pustular and eventually burst open a few days after with drainage of pus, she continued to use hot compresses and wanted to go to her PCP, but due to transportation issues, she was unable to go.  3 days ago, she became febrile, weak, lost appetite and has progressively became weaker, she endorsed 2-day onset of headache and reported passing out in her kitchen yesterday for a few minutes due to weakness, there was no biting of tongue, bowel or urinary incontinence.  Patient decided to activate EMS today she was taken to the ED for further evaluation and management.  She ambulates with a cane at baseline, she denies chest pain, shortness of  breath, diarrhea or constipation. On review of medical record, it was noted that patient had similar presentation on presenting to [facility] urgent care [facility] on 11/27/2019 during which an incision and drainage was done and patient was prescribed with antibiotics and pain medication on discharge   ED Course: In the emergency department, febrile with a temperature of 101.70F, RR 20, pulse 95 bpm, BP 135/60, O2 sat 95% on room air.  Work-up in the ED showed normal CBC except for leukocytosis, BMP showed hyponatremia and hyperglycemia.  Albumin 3.3, ALP 147, lactic acid was, urinalysis was positive for glycosuria.  Influenza A, B and SARS coronavirus 2 was negative. CT of head showed no acute intracranial abnormality Chest x-ray showed mild cardiac enlargement.  No edema or airspace opacity noted Incision and drainage was done, IV hydration was provided, she was started on IV antibiotics and Tylenol was given due to fever.  Hospitalist was asked to admit patient for further evaluation and management.  Hospital Course  Sepsis secondary to cellulitis of the ventral abdominal wall-treated with IV antibiotics and IV fluids with improvement and resolution of sepsis physiology.  Continue IV antibiotics.  Discussed with pharm D, plan to DC home tomorrow on oral omnicef and doxycycline.  Continue wound care and packing of wound.     Paroxysmal atrial fibrillation with RVR-patient was briefly on IV amiodarone and spontaneously has converted to sinus rhythm.  Consult to pharmacy for warfarin, (unable to do NOAC due to being on dilantin), I spoke with cardiology [place] [facility] and no need to bridge warfarin. We have arranged for home  health RN to check wounds and PT/INR.  INR to be checked on 6/13, 6/15, 6/17.  DC home on warfarin 7.5 mg daily.  Adjust warfarin dose as needed outpatient with PCP. Pt says she goes to [facility] for her PCP and they monitor warfarin patients.      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  Treated with basal bolus insulin in hospital.  Resume home U500 insulin at discharge (not available in hospital).   CBG (last 3)  Recent Labs    10/20/20 2025 10/21/20 0725 10/21/20 1057  GLUCAP 180* 168* 214*    Vasovagal syncope as treated with IV fluid hydration.  PT evaluation recommending HH PT which will be ordered.    Hyponatremia-resolved   Epilepsy-resume home Dilantin.   Anxiety and depression-continue BuSpar and Cymbalta.   Essential hypertension- blood pressure seems to be controlled continue to follow.   DVT prophylaxis: enoxaparin Code Status: Full Family Communication: Plan of care discussed with patient at bedside who verbalized understanding Disposition: Home with home health  Status is: Inpatient  Discharge Diagnoses:  Principal Problem:   Abdominal wall abscess Active Problems:   Abdominal wall cellulitis   SIRS (systemic inflammatory response syndrome) (HCC)   Leukocytosis   Hyponatremia   Hyperglycemia due to diabetes mellitus (HCC)   Hypoalbuminemia due to protein-calorie malnutrition (HCC)   Elevated AST (SGOT)   Essential hypertension   Hyperlipidemia   Syncope and collapse   GERD (gastroesophageal reflux disease)   Chronic back pain   Dehydration   Sepsis due to undetermined organism (HCC)   Uncontrolled type 2 diabetes mellitus with hyperglycemia, with long-term current use of insulin (HCC)   AF (paroxysmal atrial fibrillation) (HCC)   Acute respiratory failure with hypoxia (HCC)   Elevated brain natriuretic peptide (BNP) level   Acute diastolic CHF (congestive heart failure) (HCC)   Discharge Instructions:  Allergies as of 10/22/2020       Reactions   Latex Other (See Comments), Hives   Penicillins    Tolerated ceftriaxone 10/2020        Medication List     TAKE these medications    albuterol 108 (90 Base) MCG/ACT  inhaler Commonly known as: VENTOLIN HFA Inhale 2 puffs into the lungs every 4 (four) hours as needed for shortness of breath.   budesonide-formoterol 80-4.5 MCG/ACT inhaler Commonly known as: SYMBICORT Inhale 2 puffs into the lungs 2 (two) times daily.   busPIRone 10 MG tablet Commonly known as: BUSPAR Take 20 mg by mouth 3 (three) times daily.   cefdinir 300 MG capsule Commonly known as: OMNICEF Take 2 capsules (600 mg total) by mouth daily for 5 days. Start taking on: October 23, 2020   cetirizine 10 MG tablet Commonly known as: ZYRTEC Take 10 mg by mouth daily.   cyclobenzaprine 10 MG tablet Commonly known as: FLEXERIL Take 1 tablet (10 mg total) by mouth 2 (two) times daily as needed for muscle spasms.   doxycycline 100 MG capsule Commonly known as: VIBRAMYCIN Take 1 capsule (100 mg total) by mouth 2 (two) times daily for 5 days. Start taking on: October 23, 2020   DULoxetine 60 MG capsule Commonly known as: CYMBALTA Take 60 mg by mouth 2 (two) times daily.   ezetimibe 10 MG tablet Commonly known as: ZETIA Take 1 tablet by mouth daily.   fluticasone furoate-vilanterol 100-25 MCG/INH Aepb Commonly known as: BREO ELLIPTA Inhale 1 puff into the  lungs daily.   furosemide 40 MG tablet Commonly known as: LASIX Take 40 mg by mouth daily.   Hyoscyamine Sulfate SL 0.125 MG Subl Place 1 tablet under the tongue 3 (three) times daily.   IBU 800 MG tablet Generic drug: ibuprofen Take 1 tablet (800 mg total) by mouth every 8 (eight) hours as needed for moderate pain. What changed:  when to take this reasons to take this   insulin regular human CONCENTRATED 500 UNIT/ML injection Commonly known as: HUMULIN R Inject 12-15 Units into the skin 3 (three) times daily with meals. Sliding scale   LORazepam 2 MG tablet Commonly known as: ATIVAN Take 2 mg by mouth 2 (two) times daily.   losartan 50 MG tablet Commonly known as: COZAAR Take 1 tablet by mouth daily.   omeprazole  40 MG capsule Commonly known as: PRILOSEC Take 1 capsule by mouth daily.   phenytoin 100 MG ER capsule Commonly known as: DILANTIN Take 400 mg by mouth daily.   rosuvastatin 40 MG tablet Commonly known as: CRESTOR Take 1 tablet by mouth daily.   tiZANidine 4 MG tablet Commonly known as: ZANAFLEX Take 4 mg by mouth 3 (three) times daily.   topiramate 100 MG tablet Commonly known as: TOPAMAX Take 200 mg by mouth 2 (two) times daily.   traZODone 50 MG tablet Commonly known as: DESYREL Take 50 mg by mouth at bedtime.   warfarin 7.5 MG tablet Commonly known as: Coumadin Take 1 tablet (7.5 mg total) by mouth daily at 4 PM.        Follow-up Information     [facility] Schedule an appointment as soon as possible for a visit in 5 day(s).   Specialty: Internal Medicine Why: Follow-up as instructed. Contact information: [address] [place] [address] [phone]                Allergies  Allergen Reactions   Latex Other (See Comments) and Hives   Penicillins     Tolerated ceftriaxone 10/2020   Allergies as of 10/22/2020       Reactions   Latex Other (See Comments), Hives   Penicillins    Tolerated ceftriaxone 10/2020        Medication List     TAKE these medications    albuterol 108 (90 Base) MCG/ACT inhaler Commonly known as: VENTOLIN HFA Inhale 2 puffs into the lungs every 4 (four) hours as needed for shortness of breath.   budesonide-formoterol 80-4.5 MCG/ACT inhaler Commonly known as: SYMBICORT Inhale 2 puffs into the lungs 2 (two) times daily.   busPIRone 10 MG tablet Commonly known as: BUSPAR Take 20 mg by mouth 3 (three) times daily.   cefdinir 300 MG capsule Commonly known as: OMNICEF Take 2 capsules (600 mg total) by mouth daily for 5 days. Start taking on: October 23, 2020   cetirizine 10 MG tablet Commonly known as: ZYRTEC Take 10 mg by mouth daily.   cyclobenzaprine 10 MG tablet Commonly  known as: FLEXERIL Take 1 tablet (10 mg total) by mouth 2 (two) times daily as needed for muscle spasms.   doxycycline 100 MG capsule Commonly known as: VIBRAMYCIN Take 1 capsule (100 mg total) by mouth 2 (two) times daily for 5 days. Start taking on: October 23, 2020   DULoxetine 60 MG capsule Commonly known as: CYMBALTA Take 60 mg by mouth 2 (two) times daily.   ezetimibe 10 MG tablet Commonly known as: ZETIA Take 1 tablet  by mouth daily.   fluticasone furoate-vilanterol 100-25 MCG/INH Aepb Commonly known as: BREO ELLIPTA Inhale 1 puff into the lungs daily.   furosemide 40 MG tablet Commonly known as: LASIX Take 40 mg by mouth daily.   Hyoscyamine Sulfate SL 0.125 MG Subl Place 1 tablet under the tongue 3 (three) times daily.   IBU 800 MG tablet Generic drug: ibuprofen Take 1 tablet (800 mg total) by mouth every 8 (eight) hours as needed for moderate pain. What changed:  when to take this reasons to take this   insulin regular human CONCENTRATED 500 UNIT/ML injection Commonly known as: HUMULIN R Inject 12-15 Units into the skin 3 (three) times daily with meals. Sliding scale   LORazepam 2 MG tablet Commonly known as: ATIVAN Take 2 mg by mouth 2 (two) times daily.   losartan 50 MG tablet Commonly known as: COZAAR Take 1 tablet by mouth daily.   omeprazole 40 MG capsule Commonly known as: PRILOSEC Take 1 capsule by mouth daily.   phenytoin 100 MG ER capsule Commonly known as: DILANTIN Take 400 mg by mouth daily.   rosuvastatin 40 MG tablet Commonly known as: CRESTOR Take 1 tablet by mouth daily.   tiZANidine 4 MG tablet Commonly known as: ZANAFLEX Take 4 mg by mouth 3 (three) times daily.   topiramate 100 MG tablet Commonly known as: TOPAMAX Take 200 mg by mouth 2 (two) times daily.   traZODone 50 MG tablet Commonly known as: DESYREL Take 50 mg by mouth at bedtime.   warfarin 7.5 MG tablet Commonly known as: Coumadin Take 1 tablet (7.5 mg total) by  mouth daily at 4 PM.        Procedures/Studies: DG Ankle 2 Views Right  Result Date: 10/19/2020 CLINICAL DATA:  Right foot ankle pain.  No known injury. EXAM: RIGHT ANKLE - 2 VIEW COMPARISON:  No recent. FINDINGS: Diffuse soft tissue swelling. Mild degenerative change. No acute bony or joint abnormality. No evidence of fracture dislocation. IMPRESSION: Diffuse soft tissue swelling. Degenerative change. No acute bony or joint abnormality. Electronically Signed   By: Maisie Fus  Register   On: 10/19/2020 15:48   CT Head Wo Contrast  Result Date: 10/15/2020 CLINICAL DATA:  Dizziness, syncope EXAM: CT HEAD WITHOUT CONTRAST TECHNIQUE: Contiguous axial images were obtained from the base of the skull through the vertex without intravenous contrast. COMPARISON:  None. FINDINGS: Brain: No acute intracranial abnormality. Specifically, no hemorrhage, hydrocephalus, mass lesion, acute infarction, or significant intracranial injury. Vascular: No hyperdense vessel or unexpected calcification. Skull: No acute calvarial abnormality. Sinuses/Orbits: No acute findings Other: None IMPRESSION: No acute intracranial abnormality. Electronically Signed   By: Charlett Nose M.D.   On: 10/15/2020 18:59   CT CHEST W CONTRAST  Result Date: 10/17/2020 CLINICAL DATA:  Sepsis, atrial fibrillation with rapid ventricular response EXAM: CT CHEST WITH CONTRAST TECHNIQUE: Multidetector CT imaging of the chest was performed during intravenous contrast administration. CONTRAST:  75mL OMNIPAQUE IOHEXOL 300 MG/ML  SOLN COMPARISON:  None. FINDINGS: Cardiovascular: Cardiac size within normal limits. Extensive multi-vessel coronary artery calcification, largely within the left anterior descending coronary artery. No pericardial effusion. The central pulmonary arteries are of normal caliber. 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 No aortic aneurysm. Mild atherosclerotic calcification within the descending thoracic aorta.  Mediastinum/Nodes: The visualized thyroid is unremarkable. No pathologic thoracic adenopathy. Small hiatal hernia. Esophagus is unremarkable. Lungs/Pleura: Lungs are clear. No pleural effusion or pneumothorax. The central airways are widely patent. Upper Abdomen: Status post cholecystectomy.  No acute abnormality. Musculoskeletal: The osseous structures are age-appropriate. No acute bone abnormality. No lytic or blastic bone lesion. IMPRESSION: No acute intrathoracic pathology identified. No definite radiographic explanation for the patient's reported symptoms. Extensive coronary artery calcification. Small hiatal hernia. Aortic Atherosclerosis (ICD10-I70.0). Electronically Signed   By: Helyn Numbers MD   On: 10/17/2020 02:51   CT ABDOMEN PELVIS W CONTRAST  Result Date: 10/18/2020 CLINICAL DATA:  Abdominal pain. Abdominal wall abscess. Weakness. Fever. Decreased appetite. EXAM: CT ABDOMEN AND PELVIS WITH CONTRAST TECHNIQUE: Multidetector CT imaging of the abdomen and pelvis was performed using the standard protocol following bolus administration of intravenous contrast. CONTRAST:  OMNIPAQUE IOHEXOL 300 MG/ML  SOLN COMPARISON:  Chest CT of 1 day prior FINDINGS: Lower chest: Clear lung bases. Mild cardiomegaly, without pericardial or pleural effusion. Hepatobiliary: Mild hepatomegaly at greater than 19 cm craniocaudal. Favor focal fat within the posterior aspect of segment 2-3 on 27/2. No suspicious liver lesion. Cholecystectomy, without biliary ductal dilatation. Pancreas: Normal, without mass or ductal dilatation. Spleen: Normal in size, without focal abnormality. Adrenals/Urinary Tract: Minimal left adrenal nodularity. Normal right adrenal gland. Upper and lower pole punctate right renal collecting system calculi. Normal left kidney and urinary bladder. Stomach/Bowel: Normal stomach, without wall thickening. The cecum terminates in the midline. Normal terminal ileum. Normal small bowel.  Vascular/Lymphatic: Aortic atherosclerosis. No abdominopelvic adenopathy. Reproductive: Hysterectomy.  No adnexal mass. Other: Anterior abdominal wall subcutaneous edema and minimal gas, including on images 46 and 41 respectively. No well-defined or drainable fluid collection 2 suggest abscess. No fistulous communication to bowel. Other areas of dependent subcutaneous edema are likely due to anasarca. Musculoskeletal: No acute osseous abnormality. Thoracolumbar spondylosis. IMPRESSION: 1. Anterior abdominal wall subcutaneous edema, likely representing cellulitis. Cannot exclude gas-forming organism, given concurrent subcutaneous gas. No well-defined fluid collection to suggest abscess. 2. No explanation for pain. 3. Hepatomegaly. 4. Right nephrolithiasis. Electronically Signed   By: Jeronimo Greaves M.D.   On: 10/18/2020 15:17   DG Chest Port 1 View  Result Date: 10/15/2020 CLINICAL DATA:  Shortness of breath EXAM: PORTABLE CHEST 1 VIEW COMPARISON:  April 10, 2020 FINDINGS: There is no edema or airspace opacity. Heart is mildly enlarged with pulmonary vascularity normal. No adenopathy. There is degenerative change in each shoulder. IMPRESSION: Mild cardiac enlargement.  No edema or airspace opacity. Electronically Signed   By: Bretta Bang III M.D.   On: 10/15/2020 16:44   DG Foot 2 Views Right  Result Date: 10/19/2020 CLINICAL DATA:  Right foot ankle pain.  No known injury. EXAM: RIGHT FOOT - 2 VIEW COMPARISON:  No recent prior. FINDINGS: Diffuse degenerative change. Tiny corticated bony density noted adjacent to the interphalangeal joint right great toe. This is most likely degenerative. No acute bony or joint abnormality identified. No radiopaque foreign body. IMPRESSION: Diffuse degenerative change.  No acute abnormality identified. Electronically Signed   By: Maisie Fus  Register   On: 10/19/2020 15:42   ECHOCARDIOGRAM COMPLETE  Result Date: 10/16/2020    ECHOCARDIOGRAM REPORT   Patient Name:   AYDA TANCREDI Date of Exam: 10/16/2020 Medical Rec #:  [id number]    Height:       65.0 in Accession #:    [id number]   Weight:       283.1 lb Date of Birth:  April 17, 1951    BSA:          2.293 m Patient Age:    69 years     BP:  139/70 mmHg Patient Gender: F            HR:           78 bpm. [facility]am [facility] Procedure: 2D Echo, Cardiac Doppler and Color Doppler Indications:    Syncope R55  History:        Patient has no prior history of Echocardiogram examinations.                 Risk Factors:Hypertension, Dyslipidemia and Diabetes. Abdominal                 wall abscess, Morbid Obesity.  Sonographer:    Celesta Gentile RCS Referring Phys: [id number] OLADAPO ADEFESO IMPRESSIONS  1. Left ventricular ejection fraction, by estimation, is 65 to 70%. The left ventricle has normal function. The left ventricle has no regional wall motion abnormalities. There is mild left ventricular hypertrophy. Left ventricular diastolic parameters are consistent with Grade I diastolic dysfunction (impaired relaxation).  2. Right ventricular systolic function is normal. The right ventricular size is normal. Tricuspid regurgitation signal is inadequate for assessing PA pressure.  3. Left atrial size was moderately dilated.  4. Right atrial size was mildly dilated.  5. The mitral valve is grossly normal. Trivial mitral valve regurgitation.  6. The aortic valve is tricuspid. There is mild calcification of the aortic valve. Aortic valve regurgitation is not visualized. There is moderate sclerosis to mild aortic valve stenosis. Aortic valve mean gradient measures 12.0 mmHg.  7. The inferior vena cava is normal in size with greater than 50% respiratory variability, suggesting right atrial pressure of 3 mmHg. FINDINGS  Left Ventricle: Left ventricular ejection fraction, by estimation, is 65 to 70%. The left ventricle has normal function. The left ventricle has no regional wall motion abnormalities. Definity contrast agent was given IV to  delineate the left ventricular  endocardial borders. The left ventricular internal cavity size was normal in size. There is mild left ventricular hypertrophy. Left ventricular diastolic parameters are consistent with Grade I diastolic dysfunction (impaired relaxation). Right Ventricle: The right ventricular size is normal. No increase in right ventricular wall thickness. Right ventricular systolic function is normal. Tricuspid regurgitation signal is inadequate for assessing PA pressure. Left Atrium: Left atrial size was moderately dilated. Right Atrium: Right atrial size was mildly dilated. Pericardium: There is no evidence of pericardial effusion. Mitral Valve: The mitral valve is grossly normal. There is mild calcification of the mitral valve leaflet(s). Trivial mitral valve regurgitation. Tricuspid Valve: The tricuspid valve is grossly normal. Tricuspid valve regurgitation is trivial. Aortic Valve: The aortic valve is tricuspid. There is mild calcification of the aortic valve. There is mild aortic valve annular calcification. Aortic valve regurgitation is not visualized. Mild aortic stenosis is present. Aortic valve mean gradient measures 12.0 mmHg. Aortic valve peak gradient measures 24.2 mmHg. Aortic valve area, by VTI measures 1.76 cm. Pulmonic Valve: The pulmonic valve was not well visualized. Pulmonic valve regurgitation is trivial. Aorta: The aortic root is normal in size and structure. Venous: The inferior vena cava is normal in size with greater than 50% respiratory variability, suggesting right atrial pressure of 3 mmHg. IAS/Shunts: No atrial level shunt detected by color flow Doppler.  LEFT VENTRICLE PLAX 2D LVIDd:         4.07 cm  Diastology LVIDs:         2.65 cm  LV e' medial:    7.07 cm/s LV PW:         1.25 cm  LV E/e' medial:  15.0 LV IVS:        1.20 cm  LV e' lateral:   12.50 cm/s LVOT diam:     2.10 cm  LV E/e' lateral: 8.5 LV SV:         102 LV SV Index:   45 LVOT Area:     3.46 cm  RIGHT  VENTRICLE RV S prime:     22.60 cm/s TAPSE (M-mode): 3.3 cm LEFT ATRIUM              Index       RIGHT ATRIUM           Index LA diam:        4.50 cm  1.96 cm/m  RA Area:     25.60 cm LA Vol (A2C):   66.5 ml  29.00 ml/m RA Volume:   86.40 ml  37.68 ml/m LA Vol (A4C):   120.0 ml 52.34 ml/m LA Biplane Vol: 93.5 ml  40.78 ml/m  AORTIC VALVE AV Area (Vmax):    1.87 cm AV Area (Vmean):   1.85 cm AV Area (VTI):     1.76 cm AV Vmax:           246.00 cm/s AV Vmean:          161.000 cm/s AV VTI:            0.582 m AV Peak Grad:      24.2 mmHg AV Mean Grad:      12.0 mmHg LVOT Vmax:         133.00 cm/s LVOT Vmean:        85.900 cm/s LVOT VTI:          0.295 m LVOT/AV VTI ratio: 0.51  AORTA Ao Root diam: 3.30 cm MITRAL VALVE MV Area (PHT): 3.08 cm     SHUNTS MV Decel Time: 246 msec     Systemic VTI:  0.30 m MV E velocity: 106.00 cm/s  Systemic Diam: 2.10 cm MV A velocity: 135.00 cm/s MV E/A ratio:  0.79 Nona Dell MD Electronically signed by Nona Dell MD Signature Date/Time: 10/16/2020/12:37:25 PM    Final      Subjective: Pt reports some tenderness in wound but otherwise no complaints.   Discharge Exam: Vitals:   10/22/20 0541 10/22/20 0716  BP: (!) 147/69   Pulse: 73   Resp: 18   Temp: 98.2 F (36.8 C)   SpO2: 96% 92%   Vitals:   10/21/20 2015 10/21/20 2155 10/22/20 0541 10/22/20 0716  BP:  (!) 142/57 (!) 147/69   Pulse:  77 73   Resp:  17 18   Temp:  98.4 F (36.9 C) 98.2 F (36.8 C)   TempSrc:  Oral Oral   SpO2: 96% 91% 96% 92%  Weight:      Height:        General exam: Mor ob, awake, alert, in bed, Appears calm and comfortable Respiratory system: Clear to auscultation. Respiratory effort normal. Cardiovascular system: normal S1 & S2 heard. No JVD, murmurs, rubs, gallops or clicks. No pedal edema. Gastrointestinal system: Abdomen wound is packed with purulent drainage. No masses felt. Normal bowel sounds heard. Central nervous system: Alert and oriented. No focal  neurological deficits. Extremities: Symmetric 5 x 5 power. Skin: No rashes, lesions or ulcers Psychiatry: Judgement and insight appear normal. Mood & affect appropriate.    The results of significant diagnostics from this hospitalization (including imaging, microbiology, ancillary and laboratory) are listed  below for reference.     Microbiology: Recent Results (from the past 240 hour(s))  Urine culture     Status: Abnormal   Collection Time: 10/15/20  3:41 PM   Specimen: In/Out Cath Urine  Result Value Ref Range Status   Specimen Description   Final    IN/OUT CATH URINE Performed at [facility], [address], [place], [place] [address]    Special Requests   Final    NONE Performed at [facility], [address] [place], [place] [address]    Culture MULTIPLE SPECIES PRESENT, SUGGEST RECOLLECTION (A)  Final   Report Status 10/18/2020 FINAL  Final  Blood Culture (routine x 2)     Status: Abnormal   Collection Time: 10/15/20  3:58 PM   Specimen: Left Antecubital; Blood  Result Value Ref Range Status   Specimen Description   Final    LEFT ANTECUBITAL BOTTLES DRAWN AEROBIC AND ANAEROBIC Performed at [facility], [address], [place], [place] [address]    Special Requests   Final    Blood Culture adequate volume Performed at [facility], [address], [place], [place] [address]    Culture  Setup Time   Final    GRAM POSITIVE COCCI AEROBIC BOTTLE ONLY Gram Stain Report Called to,Read Back By and Verified With: COLEMAN @ 1309 ON [id number] BY HENDERSON L. CRITICAL RESULT CALLED TO, READ BACK BY AND VERIFIED WITH: Meryl Dare RN @1843  10/16/20 EB    Culture (A)  Final    STAPHYLOCOCCUS EPIDERMIDIS THE SIGNIFICANCE OF ISOLATING THIS ORGANISM FROM A SINGLE SET OF BLOOD CULTURES WHEN MULTIPLE SETS ARE DRAWN IS UNCERTAIN. PLEASE NOTIFY THE MICROBIOLOGY DEPARTMENT WITHIN ONE WEEK IF SPECIATION AND SENSITIVITIES ARE REQUIRED. Performed at [facility], [address]  [place], [place] [address]    Report Status 10/17/2020 FINAL  Final  Blood Culture ID Panel (Reflexed)     Status: Abnormal   Collection Time: 10/15/20  3:58 PM  Result Value Ref Range Status   Enterococcus faecalis NOT DETECTED NOT DETECTED Final   Enterococcus Faecium NOT DETECTED NOT DETECTED Final   Listeria monocytogenes NOT DETECTED NOT DETECTED Final   Staphylococcus species DETECTED (A) NOT DETECTED Final    Comment: CRITICAL RESULT CALLED TO, READ BACK BY AND VERIFIED WITH: Meryl Dare RN @1843  10/16/20 EB    Staphylococcus aureus (BCID) NOT DETECTED NOT DETECTED Final   Staphylococcus epidermidis DETECTED (A) NOT DETECTED Final    Comment: CRITICAL RESULT CALLED TO, READ BACK BY AND VERIFIED WITH: Meryl Dare RN @1843  10/16/20 EB    Staphylococcus lugdunensis NOT DETECTED NOT DETECTED Final   Streptococcus species NOT DETECTED NOT DETECTED Final   Streptococcus agalactiae NOT DETECTED NOT DETECTED Final   Streptococcus pneumoniae NOT DETECTED NOT DETECTED Final   Streptococcus pyogenes NOT DETECTED NOT DETECTED Final   A.calcoaceticus-baumannii NOT DETECTED NOT DETECTED Final   Bacteroides fragilis NOT DETECTED NOT DETECTED Final   Enterobacterales NOT DETECTED NOT DETECTED Final   Enterobacter cloacae complex NOT DETECTED NOT DETECTED Final   Escherichia coli NOT DETECTED NOT DETECTED Final   Klebsiella aerogenes NOT DETECTED NOT DETECTED Final   Klebsiella oxytoca NOT DETECTED NOT DETECTED Final   Klebsiella pneumoniae NOT DETECTED NOT DETECTED Final   Proteus species NOT DETECTED NOT DETECTED Final   Salmonella species NOT DETECTED NOT DETECTED Final   Serratia marcescens NOT DETECTED NOT DETECTED Final   Haemophilus influenzae NOT DETECTED NOT DETECTED Final   Neisseria meningitidis NOT DETECTED  NOT DETECTED Final   Pseudomonas aeruginosa NOT DETECTED NOT DETECTED Final   Stenotrophomonas maltophilia NOT DETECTED NOT DETECTED Final   Candida albicans NOT DETECTED NOT  DETECTED Final   Candida auris NOT DETECTED NOT DETECTED Final   Candida glabrata NOT DETECTED NOT DETECTED Final   Candida krusei NOT DETECTED NOT DETECTED Final   Candida parapsilosis NOT DETECTED NOT DETECTED Final   Candida tropicalis NOT DETECTED NOT DETECTED Final   Cryptococcus neoformans/gattii NOT DETECTED NOT DETECTED Final   Methicillin resistance mecA/C NOT DETECTED NOT DETECTED Final    Comment: Performed at [facility], [address] [place], [place] [address]  Blood Culture (routine x 2)     Status: None   Collection Time: 10/15/20  4:07 PM   Specimen: Right Antecubital; Blood  Result Value Ref Range Status   Specimen Description   Final    RIGHT ANTECUBITAL BOTTLES DRAWN AEROBIC AND ANAEROBIC   Special Requests Blood Culture adequate volume  Final   Culture   Final    NO GROWTH 5 DAYS Performed at [facility], [address], [place], [place] [address]    Report Status 10/20/2020 FINAL  Final  Resp Panel by RT-PCR (Flu A&B, Covid) Nasopharyngeal Swab     Status: None   Collection Time: 10/15/20  5:17 PM   Specimen: Nasopharyngeal Swab; Nasopharyngeal(NP) swabs in vial transport medium  Result Value Ref Range Status   SARS Coronavirus 2 by RT PCR NEGATIVE NEGATIVE Final    Comment: (NOTE) SARS-CoV-2 target nucleic acids are NOT DETECTED.  The SARS-CoV-2 RNA is generally detectable in upper respiratory specimens during the acute phase of infection. The lowest concentration of SARS-CoV-2 viral copies this assay can detect is 138 copies/mL. A negative result does not preclude SARS-Cov-2 infection and should not be used as the sole basis for treatment or other patient management decisions. A negative result may occur with  improper specimen collection/handling, submission of specimen other than nasopharyngeal swab, presence of viral mutation(s) within the areas targeted by this assay, and inadequate number of viral copies(<138 copies/mL). A negative result  must be combined with clinical observations, patient history, and epidemiological information. The expected result is Negative.  Fact Sheet for Patients:  BloggerCourse.com  Fact Sheet for Healthcare Providers:  SeriousBroker.it  This test is no t yet approved or cleared by the [place] FDA and  has been authorized for detection and/or diagnosis of SARS-CoV-2 by FDA under an Emergency Use Authorization (EUA). This EUA will remain  in effect (meaning this test can be used) for the duration of the COVID-19 declaration under Section 564(b)(1) of the Act, 21 U.S.C.section 360bbb-3(b)(1), unless the authorization is terminated  or revoked sooner.       Influenza A by PCR NEGATIVE NEGATIVE Final   Influenza B by PCR NEGATIVE NEGATIVE Final    Comment: (NOTE) The Xpert Xpress SARS-CoV-2/FLU/RSV plus assay is intended as an aid in the diagnosis of influenza from Nasopharyngeal swab specimens and should not be used as a sole basis for treatment. Nasal washings and aspirates are unacceptable for Xpert Xpress SARS-CoV-2/FLU/RSV testing.  Fact Sheet for Patients: BloggerCourse.com  Fact Sheet for Healthcare Providers: SeriousBroker.it  This test is not yet approved or cleared by the [place] FDA and has been authorized for detection and/or diagnosis of SARS-CoV-2 by FDA under an Emergency Use Authorization (EUA). This EUA will remain in effect (meaning this test can be used) for the duration of the COVID-19 declaration under Section  564(b)(1) of the Act, 21 U.S.C. section 360bbb-3(b)(1), unless the authorization is terminated or revoked.  Performed at [facility], [address], [place], [place] [address]   MRSA PCR Screening     Status: None   Collection Time: 10/17/20  2:40 AM   Specimen: Nasal Mucosa; Nasopharyngeal  Result Value Ref Range Status   MRSA by PCR NEGATIVE  NEGATIVE Final    Comment:        The GeneXpert MRSA Assay (FDA approved for NASAL specimens only), is one component of a comprehensive MRSA colonization surveillance program. It is not intended to diagnose MRSA infection nor to guide or monitor treatment for MRSA infections. Performed at [facility], [address], [place], [place] [address]      Labs: BNP (last 3 results) Recent Labs    10/17/20 0148  BNP 178.0*   Basic Metabolic Panel: Recent Labs  Lab 10/16/20 0604 10/17/20 0148 10/17/20 1130 10/18/20 0433 10/19/20 0717 10/20/20 0448 10/22/20 0632  NA 136 134*  --  138 134* 137  --   K 4.0 3.5  --  3.5 3.6 3.8  --   CL 103 101  --  103 102 103  --   CO2 26 24  --  27 25 24   --   GLUCOSE 304* 261*  --  206* 271* 175*  --   BUN 10 7*  --  7* 5* 6*  --   CREATININE 0.68 0.64  --  0.60 0.54 0.52 0.54  CALCIUM 8.3* 8.3*  --  8.1* 7.9* 8.1*  --   MG 1.8  --  1.8 1.7 2.2  --   --   PHOS 2.3*  --   --   --   --   --   --    Liver Function Tests: Recent Labs  Lab 10/15/20 1557 10/16/20 0604  AST 12* 12*  ALT 20 18  ALKPHOS 147* 129*  BILITOT 1.0 1.0  PROT 7.5 6.8  ALBUMIN 3.3* 2.9*   No results for input(s): LIPASE, AMYLASE in the last 168 hours. No results for input(s): AMMONIA in the last 168 hours. CBC: Recent Labs  Lab 10/15/20 1557 10/15/20 2225 10/18/20 0433 10/19/20 0717 10/20/20 0448 10/21/20 0514 10/22/20 0632  WBC 12.7*   < > 8.6 8.5 8.2 7.4 6.9  NEUTROABS 9.8*  --   --   --   --   --   --   HGB 13.1   < > 11.8* 11.8* 12.2 11.7* 12.3  HCT 40.5   < > 37.2 36.9 37.8 36.8 39.2  MCV 90.0   < > 89.9 90.4 90.0 90.4 90.5  PLT 248   < > 295 311 301 343 325   < > = values in this interval not displayed.   Cardiac Enzymes: No results for input(s): CKTOTAL, CKMB, CKMBINDEX, TROPONINI in the last 168 hours. BNP: Invalid input(s): POCBNP CBG: Recent Labs  Lab 10/20/20 1104 10/20/20 1707 10/20/20 2025 10/21/20 0725 10/21/20 1057  GLUCAP  229* 198* 180* 168* 214*   D-Dimer No results for input(s): DDIMER in the last 72 hours. Hgb A1c No results for input(s): HGBA1C in the last 72 hours. Lipid Profile No results for input(s): CHOL, HDL, LDLCALC, TRIG, CHOLHDL, LDLDIRECT in the last 72 hours. Thyroid function studies No results for input(s): TSH, T4TOTAL, T3FREE, THYROIDAB in the last 72 hours.  Invalid input(s): FREET3 Anemia work up No results for input(s): VITAMINB12, FOLATE, FERRITIN, TIBC, IRON, RETICCTPCT in the last  72 hours. Urinalysis    Component Value Date/Time   COLORURINE YELLOW 10/15/2020 1541   APPEARANCEUR CLEAR 10/15/2020 1541   LABSPEC 1.016 10/15/2020 1541   PHURINE 7.0 10/15/2020 1541   GLUCOSEU >=500 (A) 10/15/2020 1541   HGBUR NEGATIVE 10/15/2020 1541   BILIRUBINUR NEGATIVE 10/15/2020 1541   KETONESUR 20 (A) 10/15/2020 1541   PROTEINUR NEGATIVE 10/15/2020 1541   UROBILINOGEN 1.0 11/21/2014 1800   NITRITE NEGATIVE 10/15/2020 1541   LEUKOCYTESUR NEGATIVE 10/15/2020 1541   Sepsis Labs Invalid input(s): PROCALCITONIN,  WBC,  LACTICIDVEN Microbiology Recent Results (from the past 240 hour(s))  Urine culture     Status: Abnormal   Collection Time: 10/15/20  3:41 PM   Specimen: In/Out Cath Urine  Result Value Ref Range Status   Specimen Description   Final    IN/OUT CATH URINE Performed at [facility], [address] [place], [place] [address]    Special Requests   Final    NONE Performed at [facility], [address] [place], [place] [address]    Culture MULTIPLE SPECIES PRESENT, SUGGEST RECOLLECTION (A)  Final   Report Status 10/18/2020 FINAL  Final  Blood Culture (routine x 2)     Status: Abnormal   Collection Time: 10/15/20  3:58 PM   Specimen: Left Antecubital; Blood  Result Value Ref Range Status   Specimen Description   Final    LEFT ANTECUBITAL BOTTLES DRAWN AEROBIC AND ANAEROBIC Performed at [facility], [address] [place], [place] [address]    Special Requests    Final    Blood Culture adequate volume Performed at [facility], [address] [place], [place] [address]    Culture  Setup Time   Final    GRAM POSITIVE COCCI AEROBIC BOTTLE ONLY Gram Stain Report Called to,Read Back By and Verified With: COLEMAN @ 1309 ON [id number] BY HENDERSON L. CRITICAL RESULT CALLED TO, READ BACK BY AND VERIFIED WITH: Meryl DareLAUREN COLEMAN RN @1843  10/16/20 EB    Culture (A)  Final    STAPHYLOCOCCUS EPIDERMIDIS THE SIGNIFICANCE OF ISOLATING THIS ORGANISM FROM A SINGLE SET OF BLOOD CULTURES WHEN MULTIPLE SETS ARE DRAWN IS UNCERTAIN. PLEASE NOTIFY THE MICROBIOLOGY DEPARTMENT WITHIN ONE WEEK IF SPECIATION AND SENSITIVITIES ARE REQUIRED. Performed at [facility], [address] [place], [place] [address]    Report Status 10/17/2020 FINAL  Final  Blood Culture ID Panel (Reflexed)     Status: Abnormal   Collection Time: 10/15/20  3:58 PM  Result Value Ref Range Status   Enterococcus faecalis NOT DETECTED NOT DETECTED Final   Enterococcus Faecium NOT DETECTED NOT DETECTED Final   Listeria monocytogenes NOT DETECTED NOT DETECTED Final   Staphylococcus species DETECTED (A) NOT DETECTED Final    Comment: CRITICAL RESULT CALLED TO, READ BACK BY AND VERIFIED WITH: Meryl DareLAUREN COLEMAN RN @1843  10/16/20 EB    Staphylococcus aureus (BCID) NOT DETECTED NOT DETECTED Final   Staphylococcus epidermidis DETECTED (A) NOT DETECTED Final    Comment: CRITICAL RESULT CALLED TO, READ BACK BY AND VERIFIED WITH: Meryl DareLAUREN COLEMAN RN @1843  10/16/20 EB    Staphylococcus lugdunensis NOT DETECTED NOT DETECTED Final   Streptococcus species NOT DETECTED NOT DETECTED Final   Streptococcus agalactiae NOT DETECTED NOT DETECTED Final   Streptococcus pneumoniae NOT DETECTED NOT DETECTED Final   Streptococcus pyogenes NOT DETECTED NOT DETECTED Final   A.calcoaceticus-baumannii NOT DETECTED NOT DETECTED Final   Bacteroides fragilis NOT DETECTED NOT DETECTED Final   Enterobacterales NOT DETECTED NOT DETECTED  Final  Enterobacter cloacae complex NOT DETECTED NOT DETECTED Final   Escherichia coli NOT DETECTED NOT DETECTED Final   Klebsiella aerogenes NOT DETECTED NOT DETECTED Final   Klebsiella oxytoca NOT DETECTED NOT DETECTED Final   Klebsiella pneumoniae NOT DETECTED NOT DETECTED Final   Proteus species NOT DETECTED NOT DETECTED Final   Salmonella species NOT DETECTED NOT DETECTED Final   Serratia marcescens NOT DETECTED NOT DETECTED Final   Haemophilus influenzae NOT DETECTED NOT DETECTED Final   Neisseria meningitidis NOT DETECTED NOT DETECTED Final   Pseudomonas aeruginosa NOT DETECTED NOT DETECTED Final   Stenotrophomonas maltophilia NOT DETECTED NOT DETECTED Final   Candida albicans NOT DETECTED NOT DETECTED Final   Candida auris NOT DETECTED NOT DETECTED Final   Candida glabrata NOT DETECTED NOT DETECTED Final   Candida krusei NOT DETECTED NOT DETECTED Final   Candida parapsilosis NOT DETECTED NOT DETECTED Final   Candida tropicalis NOT DETECTED NOT DETECTED Final   Cryptococcus neoformans/gattii NOT DETECTED NOT DETECTED Final   Methicillin resistance mecA/C NOT DETECTED NOT DETECTED Final    Comment: Performed at [facility], [address] [place], [place] [address]  Blood Culture (routine x 2)     Status: None   Collection Time: 10/15/20  4:07 PM   Specimen: Right Antecubital; Blood  Result Value Ref Range Status   Specimen Description   Final    RIGHT ANTECUBITAL BOTTLES DRAWN AEROBIC AND ANAEROBIC   Special Requests Blood Culture adequate volume  Final   Culture   Final    NO GROWTH 5 DAYS Performed at [facility], [address], [place], [place] [address]    Report Status 10/20/2020 FINAL  Final  Resp Panel by RT-PCR (Flu A&B, Covid) Nasopharyngeal Swab     Status: None   Collection Time: 10/15/20  5:17 PM   Specimen: Nasopharyngeal Swab; Nasopharyngeal(NP) swabs in vial transport medium  Result Value Ref Range Status   SARS Coronavirus 2 by RT PCR NEGATIVE  NEGATIVE Final    Comment: (NOTE) SARS-CoV-2 target nucleic acids are NOT DETECTED.  The SARS-CoV-2 RNA is generally detectable in upper respiratory specimens during the acute phase of infection. The lowest concentration of SARS-CoV-2 viral copies this assay can detect is 138 copies/mL. A negative result does not preclude SARS-Cov-2 infection and should not be used as the sole basis for treatment or other patient management decisions. A negative result may occur with  improper specimen collection/handling, submission of specimen other than nasopharyngeal swab, presence of viral mutation(s) within the areas targeted by this assay, and inadequate number of viral copies(<138 copies/mL). A negative result must be combined with clinical observations, patient history, and epidemiological information. The expected result is Negative.  Fact Sheet for Patients:  BloggerCourse.com  Fact Sheet for Healthcare Providers:  SeriousBroker.it  This test is no t yet approved or cleared by the [place] FDA and  has been authorized for detection and/or diagnosis of SARS-CoV-2 by FDA under an Emergency Use Authorization (EUA). This EUA will remain  in effect (meaning this test can be used) for the duration of the COVID-19 declaration under Section 564(b)(1) of the Act, 21 U.S.C.section 360bbb-3(b)(1), unless the authorization is terminated  or revoked sooner.       Influenza A by PCR NEGATIVE NEGATIVE Final   Influenza B by PCR NEGATIVE NEGATIVE Final    Comment: (NOTE) The Xpert Xpress SARS-CoV-2/FLU/RSV plus assay is intended as an aid in the diagnosis of influenza from Nasopharyngeal swab specimens and should not  be used as a sole basis for treatment. Nasal washings and aspirates are unacceptable for Xpert Xpress SARS-CoV-2/FLU/RSV testing.  Fact Sheet for Patients: BloggerCourse.com  Fact Sheet for Healthcare  Providers: SeriousBroker.it  This test is not yet approved or cleared by the [place] FDA and has been authorized for detection and/or diagnosis of SARS-CoV-2 by FDA under an Emergency Use Authorization (EUA). This EUA will remain in effect (meaning this test can be used) for the duration of the COVID-19 declaration under Section 564(b)(1) of the Act, 21 U.S.C. section 360bbb-3(b)(1), unless the authorization is terminated or revoked.  Performed at [facility], [address], [place], [place] [address]   MRSA PCR Screening     Status: None   Collection Time: 10/17/20  2:40 AM   Specimen: Nasal Mucosa; Nasopharyngeal  Result Value Ref Range Status   MRSA by PCR NEGATIVE NEGATIVE Final    Comment:        The GeneXpert MRSA Assay (FDA approved for NASAL specimens only), is one component of a comprehensive MRSA colonization surveillance program. It is not intended to diagnose MRSA infection nor to guide or monitor treatment for MRSA infections. Performed at [facility], [address] [place], [place] [address]     Time coordinating discharge: 45 mins   SIGNED:  Standley Dakins, MD  [facility] 10/22/2020, 10:30 AM How to contact the [facility] Attending or Consulting provider 7A - 7P or covering provider during after hours 7P -7A, for this patient?  Check the care team in [facility] and look for a) attending/consulting [facility] provider listed and b) the [facility] team listed Log into [email or web address] and use [place]'s universal password to access. If you do not have the password, please contact the hospital operator. Locate the [facility] provider you are looking for under [facility] and page to a number that you can be directly reached. If you still have difficulty reaching the provider, please page the [facility] (Director on Call) for the Hospitalists listed on amion for assistance.
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
## 2021-06-02 ENCOUNTER — Encounter (HOSPITAL_COMMUNITY): Payer: Self-pay

## 2021-06-02 ENCOUNTER — Inpatient Hospital Stay (HOSPITAL_COMMUNITY)
Admission: EM | Admit: 2021-06-02 | Discharge: 2021-06-07 | DRG: 603 | Disposition: A | Discharge status: Home Health | Payer: Medicare HMO | Attending: Internal Medicine | Admitting: Internal Medicine

## 2021-06-02 ENCOUNTER — Other Ambulatory Visit: Payer: Self-pay

## 2021-06-02 DIAGNOSIS — L03311 Cellulitis of abdominal wall: Secondary | ICD-10-CM | POA: Diagnosis not present

## 2021-06-02 DIAGNOSIS — F411 Generalized anxiety disorder: Secondary | ICD-10-CM | POA: Diagnosis present

## 2021-06-02 DIAGNOSIS — Z6841 Body Mass Index (BMI) 40.0 and over, adult: Secondary | ICD-10-CM

## 2021-06-02 DIAGNOSIS — I480 Paroxysmal atrial fibrillation: Secondary | ICD-10-CM | POA: Diagnosis present

## 2021-06-02 DIAGNOSIS — I5032 Chronic diastolic (congestive) heart failure: Secondary | ICD-10-CM | POA: Diagnosis present

## 2021-06-02 DIAGNOSIS — I110 Hypertensive heart disease with heart failure: Secondary | ICD-10-CM | POA: Diagnosis present

## 2021-06-02 DIAGNOSIS — G4733 Obstructive sleep apnea (adult) (pediatric): Secondary | ICD-10-CM | POA: Diagnosis present

## 2021-06-02 DIAGNOSIS — E1165 Type 2 diabetes mellitus with hyperglycemia: Secondary | ICD-10-CM | POA: Diagnosis present

## 2021-06-02 DIAGNOSIS — Z7901 Long term (current) use of anticoagulants: Secondary | ICD-10-CM

## 2021-06-02 DIAGNOSIS — L02211 Cutaneous abscess of abdominal wall: Secondary | ICD-10-CM | POA: Diagnosis present

## 2021-06-02 DIAGNOSIS — I5033 Acute on chronic diastolic (congestive) heart failure: Secondary | ICD-10-CM

## 2021-06-02 DIAGNOSIS — Z79899 Other long term (current) drug therapy: Secondary | ICD-10-CM

## 2021-06-02 DIAGNOSIS — I10 Essential (primary) hypertension: Secondary | ICD-10-CM | POA: Diagnosis present

## 2021-06-02 DIAGNOSIS — Z880 Allergy status to penicillin: Secondary | ICD-10-CM

## 2021-06-02 DIAGNOSIS — F419 Anxiety disorder, unspecified: Secondary | ICD-10-CM | POA: Diagnosis present

## 2021-06-02 DIAGNOSIS — Z91040 Latex allergy status: Secondary | ICD-10-CM

## 2021-06-02 DIAGNOSIS — G40909 Epilepsy, unspecified, not intractable, without status epilepticus: Secondary | ICD-10-CM | POA: Diagnosis present

## 2021-06-02 DIAGNOSIS — L0390 Cellulitis, unspecified: Secondary | ICD-10-CM | POA: Diagnosis present

## 2021-06-02 DIAGNOSIS — E119 Type 2 diabetes mellitus without complications: Secondary | ICD-10-CM

## 2021-06-02 DIAGNOSIS — K219 Gastro-esophageal reflux disease without esophagitis: Secondary | ICD-10-CM | POA: Diagnosis present

## 2021-06-02 DIAGNOSIS — Z20822 Contact with and (suspected) exposure to covid-19: Secondary | ICD-10-CM | POA: Diagnosis present

## 2021-06-02 DIAGNOSIS — E7800 Pure hypercholesterolemia, unspecified: Secondary | ICD-10-CM | POA: Diagnosis present

## 2021-06-02 DIAGNOSIS — Z794 Long term (current) use of insulin: Secondary | ICD-10-CM

## 2021-06-02 HISTORY — DX: Unspecified convulsions: R56.9

## 2021-06-02 HISTORY — DX: Anxiety disorder, unspecified: F41.9

## 2021-06-02 HISTORY — DX: Heart failure, unspecified: I50.9

## 2021-06-02 LAB — COMPREHENSIVE METABOLIC PANEL
ALT: 21 U/L (ref 0–44)
AST: 14 U/L — ABNORMAL LOW (ref 15–41)
Albumin: 2.8 g/dL — ABNORMAL LOW (ref 3.5–5.0)
Alkaline Phosphatase: 124 U/L (ref 38–126)
Anion gap: 13 (ref 5–15)
BUN: 18 mg/dL (ref 8–23)
CO2: 24 mmol/L (ref 22–32)
Calcium: 9.4 mg/dL (ref 8.9–10.3)
Chloride: 105 mmol/L (ref 98–111)
Creatinine, Ser: 0.85 mg/dL (ref 0.44–1.00)
GFR, Estimated: >60 mL/min (ref >60)
Glucose, Bld: 150 mg/dL — ABNORMAL HIGH (ref 70–99)
Potassium: 4 mmol/L (ref 3.5–5.1)
Sodium: 142 mmol/L (ref 135–145)
Total Bilirubin: 0.2 mg/dL — ABNORMAL LOW (ref 0.3–1.2)
Total Protein: 7.2 g/dL (ref 6.5–8.1)

## 2021-06-02 LAB — CBC WITH DIFFERENTIAL/PLATELET
Abs Immature Granulocytes: 0.04 10*3/uL (ref 0.00–0.07)
Basophils Absolute: 0 10*3/uL (ref 0.0–0.1)
Basophils Relative: 0 %
Eosinophils Absolute: 0.2 10*3/uL (ref 0.0–0.5)
Eosinophils Relative: 2 %
HCT: 37.8 % (ref 36.0–46.0)
Hemoglobin: 12.3 g/dL (ref 12.0–15.0)
Immature Granulocytes: 0 %
Lymphocytes Relative: 18 %
Lymphs Abs: 1.7 10*3/uL (ref 0.7–4.0)
MCH: 28.5 pg (ref 26.0–34.0)
MCHC: 32.5 g/dL (ref 30.0–36.0)
MCV: 87.7 fL (ref 80.0–100.0)
Monocytes Absolute: 0.7 10*3/uL (ref 0.1–1.0)
Monocytes Relative: 7 %
Neutro Abs: 7.2 10*3/uL (ref 1.7–7.7)
Neutrophils Relative %: 73 %
Platelets: 307 10*3/uL (ref 150–400)
RBC: 4.31 MIL/uL (ref 3.87–5.11)
RDW: 13.7 % (ref 11.5–15.5)
WBC: 9.9 10*3/uL (ref 4.0–10.5)
nRBC: 0 % (ref 0.0–0.2)

## 2021-06-02 LAB — LACTIC ACID, PLASMA: Lactic Acid, Venous: 1.6 mmol/L (ref 0.5–1.9)

## 2021-06-02 MED ORDER — LIDOCAINE-EPINEPHRINE (PF) 2 %-1:200000 IJ SOLN
10.0000 mL | Freq: Once | INTRAMUSCULAR | Status: AC
  Administered 2021-06-03: 10 mL
  Filled 2021-06-02: qty 20

## 2021-06-02 MED ORDER — VANCOMYCIN HCL IN DEXTROSE 1-5 GM/200ML-% IV SOLN
1000.0000 mg | Freq: Once | INTRAVENOUS | Status: AC
  Administered 2021-06-03: 1000 mg via INTRAVENOUS
  Filled 2021-06-02: qty 200

## 2021-06-02 NOTE — ED Notes (Addendum)
Pt resting comfortably

## 2021-06-02 NOTE — ED Notes (Signed)
Carollee Herter - caregiver/friend [phone]

## 2021-06-02 NOTE — ED Provider Triage Note (Signed)
Emergency Medicine Provider Triage Evaluation Note  Donna Snow , a 71 y.o. female  was evaluated in triage.  Pt complains of abscess.  To the right lower part of her abdominal wall, was seen in urgent care on the 17th and given clindamycin.  Has been taking the clindamycin but the erythema is spreading.  There is also purulent drainage noted.  History of these previously, last year was hospitalized for 5 days for IV antibiotics..  Review of Systems  Positive: Abscess Negative: Fever  Physical Exam  BP (!) 135/52 (BP Location: Right Arm)    Pulse 83    Temp 98.9 F (37.2 C) (Oral)    Resp 18    Ht 5\' 3"  (1.6 m)    Wt (!) 143.8 kg    SpO2 95%    BMI 56.15 kg/m  Gen:   Awake, no distress   Resp:  Normal effort  MSK:   Moves extremities without difficulty  Other:  Purulent draining abscess to the right lower abdominal wall.  10 to 12 cm erythema surrounding it.  Medical Decision Making  Medically screening exam initiated at 4:33 PM.  Appropriate orders placed.  Caroleen Hamman was informed that the remainder of the evaluation will be completed by another provider, this initial triage assessment does not replace that evaluation, and the importance of remaining in the ED until their evaluation is complete.  Significant abscess with surrounding cellulitis.  Failed outpatient, will check labs.    Sherrill Raring, PA-C 06/02/21 1635

## 2021-06-02 NOTE — ED Triage Notes (Signed)
Pt arrived POV from home c/o abdominal abscess that she noticed a week ago. Pt states it is painful and is now draining fluid.

## 2021-06-02 NOTE — ED Provider Notes (Signed)
[facility] EMERGENCY DEPARTMENT Provider Note   CSN: [id number] Arrival date & time: 06/02/21  1505     History  Chief Complaint  Patient presents with   Abscess    Donna Snow is a 71 y.o. female.  The history is provided by the patient.  Abscess She has history of hypertension, diabetes, hyperlipidemia, diastolic heart failure, seizures paroxysmal atrial fibrillation anticoagulated on warfarin and comes in because of an abscess on her abdomen.  She noted some itching on the right side of her abdomen about a week ago and it is gotten progressively more red and more swollen.  It started draining yesterday.  She has had fevers at home as high as 101 with associated chills and sweats.  She denies any nausea or vomiting.  The only treatment at home has been warm compresses.  She does have a history of having to be admitted for abdominal wall abscess and cellulitis in the past.   Home Medications Prior to Admission medications   Medication Sig Start Date End Date Taking? Authorizing Provider  albuterol (VENTOLIN HFA) 108 (90 Base) MCG/ACT inhaler Inhale 2 puffs into the lungs every 4 (four) hours as needed for shortness of breath. 06/06/20   [provider]  budesonide-formoterol (SYMBICORT) 80-4.5 MCG/ACT inhaler Inhale 2 puffs into the lungs 2 (two) times daily. 11/22/18   [provider]  busPIRone (BUSPAR) 10 MG tablet Take 20 mg by mouth 3 (three) times daily. 08/16/20   [provider]  cetirizine (ZYRTEC) 10 MG tablet Take 10 mg by mouth daily.    [provider]  cyclobenzaprine (FLEXERIL) 10 MG tablet Take 1 tablet (10 mg total) by mouth 2 (two) times daily as needed for muscle spasms. 03/07/15   Dorie Rank, MD  DULoxetine (CYMBALTA) 60 MG capsule Take 60 mg by mouth 2 (two) times daily.    [provider]  ezetimibe (ZETIA) 10 MG tablet Take 1 tablet by mouth daily. 07/25/18   [provider]  fluticasone  furoate-vilanterol (BREO ELLIPTA) 100-25 MCG/INH AEPB Inhale 1 puff into the lungs daily. 03/08/20   [provider]  furosemide (LASIX) 40 MG tablet Take 40 mg by mouth daily. 09/27/20   [provider]  Hyoscyamine Sulfate SL 0.125 MG SUBL Place 1 tablet under the tongue 3 (three) times daily. 08/23/18   [provider]  IBU 800 MG tablet Take 1 tablet (800 mg total) by mouth every 8 (eight) hours as needed for moderate pain. 10/22/20   Johnson, Clanford L, MD  insulin regular human CONCENTRATED (HUMULIN R) 500 UNIT/ML injection Inject 12-15 Units into the skin 3 (three) times daily with meals. Sliding scale    [provider]  LORazepam (ATIVAN) 2 MG tablet Take 2 mg by mouth 2 (two) times daily.    [provider]  losartan (COZAAR) 50 MG tablet Take 1 tablet by mouth daily. 09/21/20   [provider]  omeprazole (PRILOSEC) 40 MG capsule Take 1 capsule by mouth daily. 06/05/20   [provider]  phenytoin (DILANTIN) 100 MG ER capsule Take 400 mg by mouth daily.    [provider]  rosuvastatin (CRESTOR) 40 MG tablet Take 1 tablet by mouth daily. 12/22/18   [provider]  tiZANidine (ZANAFLEX) 4 MG tablet Take 4 mg by mouth 3 (three) times daily. 09/01/20   [provider]  topiramate (TOPAMAX) 100 MG tablet Take 200 mg by mouth 2 (two) times daily. 06/06/20   [provider]  traZODone (DESYREL) 50 MG tablet Take 50 mg by mouth at bedtime. 08/17/20   [provider]  warfarin (COUMADIN) 7.5 MG tablet Take 1 tablet (7.5 mg total) by mouth daily at 4 PM. 10/22/20 11/21/20  Johnson, Clanford L, MD  atorvastatin (LIPITOR) 40 MG tablet Take 40 mg by mouth daily.  10/21/20  [provider]  citalopram (CELEXA) 20 MG tablet Take by mouth. Patient not taking: Reported on 10/15/2020  10/21/20  [provider]  dicyclomine (BENTYL) 10 MG capsule Take by mouth. Patient not taking: Reported on  10/15/2020  10/21/20  [provider]  fexofenadine (ALLEGRA) 180 MG tablet Take 180 mg by mouth daily.    11/27/19  [provider]  glipiZIDE (GLUCOTROL) 10 MG tablet Take 10 mg by mouth 2 (two) times daily before a meal.    11/27/19  [provider]  lisinopril-hydrochlorothiazide (PRINZIDE,ZESTORETIC) 10-12.5 MG per tablet Take 1 tablet by mouth daily.   Patient not taking: No sig reported  10/21/20  [provider]  sitaGLIPtan-metformin (JANUMET) 50-1000 MG per tablet Take 1 tablet by mouth 2 (two) times daily with a meal.    11/27/19  [provider]  zolpidem (AMBIEN) 10 MG tablet Take 10 mg by mouth at bedtime as needed.    11/27/19  [provider]      Allergies    Latex and Penicillins    Review of Systems   Review of Systems  All other systems reviewed and are negative.  Physical Exam Updated Vital Signs BP (!) 146/72    Pulse 60    Temp 98 F (36.7 C)    Resp 16    Ht 5\' 3"  (1.6 m)    Wt (!) 143.8 kg    SpO2 99%    BMI 56.15 kg/m  Physical Exam Vitals and nursing note reviewed.  71 year old female, resting comfortably and in no acute distress. Vital signs are significant for elevated blood pressure. Oxygen saturation is 99%, which is normal. Head is normocephalic and atraumatic. PERRLA, EOMI. Oropharynx is clear. Neck is nontender and supple without adenopathy or JVD. Back is nontender and there is no CVA tenderness. Lungs are clear without rales, wheezes, or rhonchi. Chest is nontender. Heart has regular rate and rhythm without murmur. Abdomen: There is a large area of erythema on the right side of the abdomen involving almost the entire right side of the abdomen.  In the right mid abdomen is an indurated area measuring 12 x 9 cm.  There is some desquamation at the center of the indurated area with some spontaneous purulent drainage. Extremities have no cyanosis or edema, full range of motion is present. Skin is warm and dry  without rash. Neurologic: Mental status is normal, cranial nerves are intact, moves all extremities equally.  ED Results / Procedures / Treatments   Labs (all labs ordered are listed, but only abnormal results are displayed) Labs Reviewed  COMPREHENSIVE METABOLIC PANEL - Abnormal; Notable for the following components:      Result Value   Glucose, Bld 150 (*)    Albumin 2.8 (*)    AST 14 (*)    Total Bilirubin 0.2 (*)    All other components within normal limits  RESP PANEL BY RT-PCR (FLU A&B, COVID) ARPGX2  CULTURE, BLOOD (ROUTINE X 2)  CULTURE, BLOOD (ROUTINE X 2)  CBC WITH DIFFERENTIAL/PLATELET  LACTIC ACID, PLASMA  LACTIC ACID, PLASMA   Procedures .Marland KitchenIncision and Drainage  Date/Time: 06/03/2021 12:53 AM Performed by: Delora Fuel, MD Authorized by: Delora Fuel, MD   Consent:    Consent obtained:  Verbal   Consent given by:  Patient   Risks, benefits, and alternatives were discussed: yes     Risks discussed:  Bleeding, incomplete drainage and pain   Alternatives discussed:  No treatment Universal protocol:    Procedure explained and questions answered to patient or proxy's satisfaction: yes     Relevant documents present and verified: yes     Test results available : yes     Required blood products, implants, devices, and special equipment available: yes     Site/side marked: yes     Immediately prior to procedure, a time out was called: yes     Patient identity confirmed:  Verbally with patient and arm band Location:    Type:  Abscess   Size:  12 cmx9 cm   Location:  Trunk   Trunk location:  Abdomen Pre-procedure details:    Skin preparation:  Antiseptic wash Sedation:    Sedation type:  None Anesthesia:    Anesthesia method:  Local infiltration   Local anesthetic:  Lidocaine 2% WITH epi Procedure type:    Complexity:  Complex Procedure details:    Ultrasound guidance: no     Needle aspiration: no     Incision types:  Cruciate   Wound management:  Probed  and deloculated   Drainage:  Purulent   Drainage amount:  Copious   Wound treatment:  Wound left open   Packing materials:  None Post-procedure details:    Procedure completion:  Tolerated well, no immediate complications    Medications Ordered in ED Medications  vancomycin (VANCOCIN) IVPB 1000 mg/200 mL premix (has no administration in time range)  lidocaine-EPINEPHrine (XYLOCAINE W/EPI) 2 %-1:200000 (PF) injection 10 mL (has no administration in time range)    ED Course/ Medical Decision Making/ A&P                           Medical Decision Making Amount and/or Complexity of Data Reviewed Radiology: ordered.  Risk Prescription drug management.   Abscess of the right side of the abdominal wall with cellulitis.  Although the abscess is draining, he clearly needs better drainage so incision and drainage is performed.  Because of the degree of cellulitis present, it is felt that she would require inpatient care, and she is given a dose of vancomycin.  Labs were actually rather unremarkable with normal WBC and normal electrolytes.  Glucose is mildly elevated.  Albumin is low suggesting underlying nutritional deficit.  Old records are reviewed confirming hospitalization last June for similar presentation.  Case is discussed with Dr. Bridgett Larsson of [facility], who agrees to admit the patient.  Per his request, wound culture is sent and CT of abdomen and pelvis is ordered to evaluate extent of abscess.        Final Clinical Impression(s) / ED Diagnoses Final diagnoses:  Cellulitis of right abdominal wall  Abscess of abdominal wall    Rx / DC Orders ED Discharge Orders     None         Delora Fuel, MD [id number]

## 2021-06-03 ENCOUNTER — Observation Stay (HOSPITAL_COMMUNITY): Payer: Medicare HMO

## 2021-06-03 DIAGNOSIS — G40909 Epilepsy, unspecified, not intractable, without status epilepticus: Secondary | ICD-10-CM

## 2021-06-03 DIAGNOSIS — F411 Generalized anxiety disorder: Secondary | ICD-10-CM

## 2021-06-03 DIAGNOSIS — L02211 Cutaneous abscess of abdominal wall: Secondary | ICD-10-CM | POA: Diagnosis not present

## 2021-06-03 DIAGNOSIS — K219 Gastro-esophageal reflux disease without esophagitis: Secondary | ICD-10-CM

## 2021-06-03 DIAGNOSIS — Z7901 Long term (current) use of anticoagulants: Secondary | ICD-10-CM

## 2021-06-03 DIAGNOSIS — I480 Paroxysmal atrial fibrillation: Secondary | ICD-10-CM

## 2021-06-03 DIAGNOSIS — Z6841 Body Mass Index (BMI) 40.0 and over, adult: Secondary | ICD-10-CM

## 2021-06-03 DIAGNOSIS — I10 Essential (primary) hypertension: Secondary | ICD-10-CM

## 2021-06-03 LAB — COMPREHENSIVE METABOLIC PANEL
ALT: 20 U/L (ref 0–44)
AST: 15 U/L (ref 15–41)
Albumin: 2.7 g/dL — ABNORMAL LOW (ref 3.5–5.0)
Alkaline Phosphatase: 127 U/L — ABNORMAL HIGH (ref 38–126)
Anion gap: 10 (ref 5–15)
BUN: 15 mg/dL (ref 8–23)
CO2: 22 mmol/L (ref 22–32)
Calcium: 8.4 mg/dL — ABNORMAL LOW (ref 8.9–10.3)
Chloride: 106 mmol/L (ref 98–111)
Creatinine, Ser: 0.74 mg/dL (ref 0.44–1.00)
GFR, Estimated: >60 mL/min (ref >60)
Glucose, Bld: 180 mg/dL — ABNORMAL HIGH (ref 70–99)
Potassium: 3.5 mmol/L (ref 3.5–5.1)
Sodium: 138 mmol/L (ref 135–145)
Total Bilirubin: 0.7 mg/dL (ref 0.3–1.2)
Total Protein: 6.8 g/dL (ref 6.5–8.1)

## 2021-06-03 LAB — PROTIME-INR
INR: 3 — ABNORMAL HIGH (ref 0.8–1.2)
Prothrombin Time: 31.3 seconds — ABNORMAL HIGH (ref 11.4–15.2)

## 2021-06-03 LAB — CBC WITH DIFFERENTIAL/PLATELET
Abs Immature Granulocytes: 0.03 10*3/uL (ref 0.00–0.07)
Basophils Absolute: 0 10*3/uL (ref 0.0–0.1)
Basophils Relative: 0 %
Eosinophils Absolute: 0.2 10*3/uL (ref 0.0–0.5)
Eosinophils Relative: 2 %
HCT: 36.8 % (ref 36.0–46.0)
Hemoglobin: 12 g/dL (ref 12.0–15.0)
Immature Granulocytes: 0 %
Lymphocytes Relative: 22 %
Lymphs Abs: 2.1 10*3/uL (ref 0.7–4.0)
MCH: 28.4 pg (ref 26.0–34.0)
MCHC: 32.6 g/dL (ref 30.0–36.0)
MCV: 87.2 fL (ref 80.0–100.0)
Monocytes Absolute: 0.6 10*3/uL (ref 0.1–1.0)
Monocytes Relative: 7 %
Neutro Abs: 6.5 10*3/uL (ref 1.7–7.7)
Neutrophils Relative %: 69 %
Platelets: 333 10*3/uL (ref 150–400)
RBC: 4.22 MIL/uL (ref 3.87–5.11)
RDW: 13.7 % (ref 11.5–15.5)
WBC: 9.4 10*3/uL (ref 4.0–10.5)
nRBC: 0 % (ref 0.0–0.2)

## 2021-06-03 LAB — LACTIC ACID, PLASMA: Lactic Acid, Venous: 1 mmol/L (ref 0.5–1.9)

## 2021-06-03 LAB — CBG MONITORING, ED
Glucose-Capillary: 199 mg/dL — ABNORMAL HIGH (ref 70–99)
Glucose-Capillary: 135 mg/dL — ABNORMAL HIGH (ref 70–99)
Glucose-Capillary: 135 mg/dL — ABNORMAL HIGH (ref 70–99)
Glucose-Capillary: 48 mg/dL — ABNORMAL LOW (ref 70–99)

## 2021-06-03 LAB — GLUCOSE, CAPILLARY
Glucose-Capillary: 178 mg/dL — ABNORMAL HIGH (ref 70–99)
Glucose-Capillary: 170 mg/dL — ABNORMAL HIGH (ref 70–99)
Glucose-Capillary: 181 mg/dL — ABNORMAL HIGH (ref 70–99)

## 2021-06-03 LAB — RESP PANEL BY RT-PCR (FLU A&B, COVID) ARPGX2
Influenza A by PCR: NEGATIVE
Influenza B by PCR: NEGATIVE
SARS Coronavirus 2 by RT PCR: NEGATIVE

## 2021-06-03 LAB — HEMOGLOBIN A1C
Hgb A1c MFr Bld: 7.5 % — ABNORMAL HIGH (ref 4.8–5.6)
Mean Plasma Glucose: 169 mg/dL

## 2021-06-03 LAB — PHENYTOIN LEVEL, TOTAL: Phenytoin Lvl: 5.2 ug/mL — ABNORMAL LOW (ref 10.0–20.0)

## 2021-06-03 MED ORDER — VANCOMYCIN HCL 1500 MG/300ML IV SOLN
1500.0000 mg | INTRAVENOUS | Status: DC
  Filled 2021-06-03: qty 300

## 2021-06-03 MED ORDER — ONDANSETRON HCL 4 MG/2ML IJ SOLN: 4.0000 mg | Freq: Four times a day (QID) | INTRAMUSCULAR | Status: DC | PRN

## 2021-06-03 MED ORDER — EZETIMIBE 10 MG PO TABS
10.0000 mg | ORAL_TABLET | Freq: Every day | ORAL | Status: DC
  Administered 2021-06-03 – 2021-06-07 (×5): 10 mg via ORAL
  Filled 2021-06-03 (×5): qty 1

## 2021-06-03 MED ORDER — ONDANSETRON HCL 4 MG PO TABS: 4.0000 mg | ORAL_TABLET | Freq: Four times a day (QID) | ORAL | Status: DC | PRN

## 2021-06-03 MED ORDER — INSULIN ASPART 100 UNIT/ML IJ SOLN
0.0000 [IU] | Freq: Three times a day (TID) | INTRAMUSCULAR | Status: DC
  Administered 2021-06-03: 4 [IU] via SUBCUTANEOUS
  Administered 2021-06-04 (×2): 7 [IU] via SUBCUTANEOUS
  Administered 2021-06-04: 11 [IU] via SUBCUTANEOUS
  Administered 2021-06-05: 15 [IU] via SUBCUTANEOUS
  Administered 2021-06-05 (×2): 7 [IU] via SUBCUTANEOUS
  Administered 2021-06-06 (×3): 11 [IU] via SUBCUTANEOUS
  Administered 2021-06-07: 12:00:00 15 [IU] via SUBCUTANEOUS
  Administered 2021-06-07: 08:00:00 11 [IU] via SUBCUTANEOUS

## 2021-06-03 MED ORDER — LIDOCAINE-EPINEPHRINE (PF) 2 %-1:200000 IJ SOLN: 10.0000 mL | Freq: Once | INTRAMUSCULAR | Status: DC

## 2021-06-03 MED ORDER — PANTOPRAZOLE SODIUM 40 MG PO TBEC
40.0000 mg | DELAYED_RELEASE_TABLET | Freq: Every day | ORAL | Status: DC
  Administered 2021-06-03 – 2021-06-07 (×5): 40 mg via ORAL
  Filled 2021-06-03 (×5): qty 1

## 2021-06-03 MED ORDER — BENZONATATE 100 MG PO CAPS
200.0000 mg | ORAL_CAPSULE | Freq: Two times a day (BID) | ORAL | Status: AC | PRN
  Administered 2021-06-04 (×2): 200 mg via ORAL
  Filled 2021-06-03 (×2): qty 2

## 2021-06-03 MED ORDER — ROSUVASTATIN CALCIUM 20 MG PO TABS
40.0000 mg | ORAL_TABLET | Freq: Every day | ORAL | Status: DC
  Administered 2021-06-03 – 2021-06-07 (×5): 40 mg via ORAL
  Filled 2021-06-03 (×5): qty 2

## 2021-06-03 MED ORDER — OXYCODONE HCL 5 MG PO TABS
5.0000 mg | ORAL_TABLET | Freq: Once | ORAL | Status: AC
  Administered 2021-06-03: 5 mg via ORAL
  Filled 2021-06-03: qty 1

## 2021-06-03 MED ORDER — VANCOMYCIN HCL 1500 MG/300ML IV SOLN
1500.0000 mg | Freq: Two times a day (BID) | INTRAVENOUS | Status: DC
  Filled 2021-06-03: qty 300

## 2021-06-03 MED ORDER — VANCOMYCIN HCL 1750 MG/350ML IV SOLN
1750.0000 mg | INTRAVENOUS | Status: DC
  Administered 2021-06-03 – 2021-06-07 (×5): 1750 mg via INTRAVENOUS
  Filled 2021-06-03 (×6): qty 350

## 2021-06-03 MED ORDER — TIZANIDINE HCL 2 MG PO TABS
8.0000 mg | ORAL_TABLET | Freq: Every day | ORAL | Status: DC
  Administered 2021-06-03 – 2021-06-06 (×4): 8 mg via ORAL
  Filled 2021-06-03: qty 2
  Filled 2021-06-03 (×4): qty 4

## 2021-06-03 MED ORDER — NYSTATIN 100000 UNIT/GM EX CREA
TOPICAL_CREAM | Freq: Two times a day (BID) | CUTANEOUS | Status: DC
  Filled 2021-06-03: qty 15

## 2021-06-03 MED ORDER — TOPIRAMATE 100 MG PO TABS
200.0000 mg | ORAL_TABLET | Freq: Two times a day (BID) | ORAL | Status: DC
  Administered 2021-06-03 – 2021-06-07 (×9): 200 mg via ORAL
  Filled 2021-06-03 (×12): qty 2

## 2021-06-03 MED ORDER — INSULIN ASPART 100 UNIT/ML IJ SOLN
0.0000 [IU] | Freq: Every day | INTRAMUSCULAR | Status: DC
  Administered 2021-06-04: 2 [IU] via SUBCUTANEOUS
  Administered 2021-06-05 – 2021-06-06 (×2): 3 [IU] via SUBCUTANEOUS

## 2021-06-03 MED ORDER — TRAZODONE HCL 150 MG PO TABS
150.0000 mg | ORAL_TABLET | Freq: Every day | ORAL | Status: DC
  Administered 2021-06-03 – 2021-06-06 (×4): 150 mg via ORAL
  Filled 2021-06-03 (×4): qty 1

## 2021-06-03 MED ORDER — POLYETHYLENE GLYCOL 3350 17 G PO PACK
17.0000 g | PACK | Freq: Every day | ORAL | Status: DC
  Administered 2021-06-04 – 2021-06-07 (×4): 17 g via ORAL
  Filled 2021-06-03 (×4): qty 1

## 2021-06-03 MED ORDER — BUSPIRONE HCL 5 MG PO TABS
20.0000 mg | ORAL_TABLET | Freq: Three times a day (TID) | ORAL | Status: DC
  Administered 2021-06-03 – 2021-06-07 (×14): 20 mg via ORAL
  Filled 2021-06-03 (×3): qty 1
  Filled 2021-06-03: qty 2
  Filled 2021-06-03 (×10): qty 1
  Filled 2021-06-03: qty 2

## 2021-06-03 MED ORDER — OXYCODONE HCL 5 MG PO TABS
5.0000 mg | ORAL_TABLET | ORAL | Status: DC | PRN
  Administered 2021-06-03 (×2): 5 mg via ORAL
  Filled 2021-06-03 (×2): qty 1

## 2021-06-03 MED ORDER — WARFARIN - PHARMACIST DOSING INPATIENT
Freq: Every day | Status: DC
  Filled 2021-06-03: qty 1

## 2021-06-03 MED ORDER — HYDROXYZINE HCL 25 MG PO TABS
25.0000 mg | ORAL_TABLET | Freq: Three times a day (TID) | ORAL | Status: DC
  Administered 2021-06-03 – 2021-06-07 (×14): 25 mg via ORAL
  Filled 2021-06-03 (×15): qty 1

## 2021-06-03 MED ORDER — DEXTROSE 50 % IV SOLN
1.0000 | Freq: Once | INTRAVENOUS | Status: AC
  Administered 2021-06-03: 50 mL via INTRAVENOUS
  Filled 2021-06-03: qty 50

## 2021-06-03 MED ORDER — WARFARIN SODIUM 7.5 MG PO TABS
7.5000 mg | ORAL_TABLET | Freq: Once | ORAL | Status: AC
  Administered 2021-06-03: 7.5 mg via ORAL
  Filled 2021-06-03 (×2): qty 1

## 2021-06-03 MED ORDER — PHENYTOIN SODIUM EXTENDED 100 MG PO CAPS
400.0000 mg | ORAL_CAPSULE | Freq: Every day | ORAL | Status: DC
  Administered 2021-06-03 – 2021-06-07 (×5): 400 mg via ORAL
  Filled 2021-06-03 (×5): qty 4

## 2021-06-03 MED ORDER — IOHEXOL 350 MG/ML SOLN
100.0000 mL | Freq: Once | INTRAVENOUS | Status: AC | PRN
  Administered 2021-06-03: 100 mL via INTRAVENOUS

## 2021-06-03 MED ORDER — DULOXETINE HCL 60 MG PO CPEP
60.0000 mg | ORAL_CAPSULE | Freq: Two times a day (BID) | ORAL | Status: DC
  Administered 2021-06-03 – 2021-06-07 (×9): 60 mg via ORAL
  Filled 2021-06-03 (×10): qty 1

## 2021-06-03 MED ORDER — ACETAMINOPHEN 650 MG RE SUPP: 650.0000 mg | Freq: Four times a day (QID) | RECTAL | Status: DC | PRN

## 2021-06-03 MED ORDER — FLUTICASONE FUROATE-VILANTEROL 100-25 MCG/ACT IN AEPB
1.0000 | INHALATION_SPRAY | Freq: Every day | RESPIRATORY_TRACT | Status: DC
  Administered 2021-06-03 – 2021-06-07 (×5): 1 via RESPIRATORY_TRACT
  Filled 2021-06-03: qty 28

## 2021-06-03 MED ORDER — MELATONIN 5 MG PO TABS
10.0000 mg | ORAL_TABLET | Freq: Every evening | ORAL | Status: DC | PRN
  Administered 2021-06-04 – 2021-06-06 (×3): 10 mg via ORAL
  Filled 2021-06-03 (×4): qty 2

## 2021-06-03 MED ORDER — ACETAMINOPHEN 325 MG PO TABS
650.0000 mg | ORAL_TABLET | Freq: Four times a day (QID) | ORAL | Status: DC | PRN
  Administered 2021-06-05: 650 mg via ORAL
  Filled 2021-06-03: qty 2

## 2021-06-03 NOTE — Assessment & Plan Note (Addendum)
-  Continue Coumadin for anticoagulation

## 2021-06-03 NOTE — Progress Notes (Signed)
ANTICOAGULATION & ANTIBIOTIC CONSULT NOTE - Follow Up Consult  Pharmacy Consult for warfarin; vancomycin Indication: atrial fibrillation; cellulitis   Allergies  Allergen Reactions   Latex Other (See Comments) and Hives   Penicillins     Tolerated ceftriaxone 10/2020    Patient Measurements: Height: 5\' 3"  (160 cm) Weight: (!) 143.8 kg (317 lb) IBW/kg (Calculated) : 52.4  Vital Signs: Temp: 98 F (36.7 C) (01/19 1928) Temp Source: Oral (01/19 1559) BP: 146/72 (01/19 2302) Pulse Rate: 60 (01/19 2302)  Labs: Recent Labs    06/02/21 1642  HGB 12.3  HCT 37.8  PLT 307  CREATININE 0.85    Estimated Creatinine Clearance: 87.8 mL/min (by C-G formula based on SCr of 0.85 mg/dL).   Medications:  (Not in a hospital admission)   Assessment: 76 YOF admitted with erythema on her abdominal wall who failed outpatient clindamycin for cellulitis. Pharmacy consulted to start vancomycin and resume home warfarin for h/o Afib.  AC: Last dose of warfarin was on 1/19. INR on 1/17 was therapeutic at 2.6. H/H and Plt wnl  ID: SCr wnl, WBC wnl.  Goal of Therapy:  INR 2-3 Monitor platelets by anticoagulation protocol: Yes   Plan:  -Obtain INR with AM labs and dose warfarin accordingly this evening  -Vancomycin 1500 mg IV Q 12 hrs. Goal AUC 400-550. Expected AUC: 545 SCr used: 0.85 -Monitor CBC, renal fx, cultures and clinical progress -Vanc levels as indicated   2/17, PharmD., BCPS, BCCCP Clinical Pharmacist Please refer to [facility] for unit-specific pharmacist

## 2021-06-03 NOTE — Progress Notes (Signed)
Pharmacy Antibiotic Note  Donna Snow is a 71 y.o. female admitted on 06/02/2021 with cellulitis.  Pharmacy has been consulted for vancomycin dosing. SCr stable at 0.74. Patient received vancomycin 1g IV x 1 dose 1/20 at 0035.  Plan: Continue vancomycin 1750mg  IV q24h. Goal AUC 400-550. Will start regimen now, as patient received less than adequate load overnight. Expected AUC: 487 SCr used: 0.8 Monitor clinical progress, c/s, renal function F/u de-escalation plan/LOT, vancomycin levels as indicated   Height: 5\' 3"  (160 cm) Weight: (!) 143.8 kg (317 lb) IBW/kg (Calculated) : 52.4  Temp (24hrs), Avg:98.3 F (36.8 C), Min:98 F (36.7 C), Max:98.9 F (37.2 C)  Recent Labs  Lab 06/02/21 1642 06/02/21 1643 06/03/21 0326  WBC 9.9  --  9.4  CREATININE 0.85  --  0.74  LATICACIDVEN  --  1.6 1.0    Estimated Creatinine Clearance: 93.2 mL/min (by C-G formula based on SCr of 0.74 mg/dL).    Allergies  Allergen Reactions   Latex Other (See Comments) and Hives   Penicillins     Tolerated ceftriaxone 10/2020    06/05/21, PharmD, BCPS Please check AMION for all [facility] contact numbers Clinical Pharmacist 06/03/2021 8:03 AM

## 2021-06-03 NOTE — Assessment & Plan Note (Addendum)
On Coumadin 

## 2021-06-03 NOTE — H&P (Signed)
History and Physical    Donna Snow [id number] DOB: [birth year] DOA: 06/02/2021  PCP: [place]   Patient coming from: Home  I have personally briefly reviewed patient's old medical records in [place]  CC: abd wall abscess HPI: 71 yo WF with hx of morbid obesity, PAF, HTN, DM2, anxiety, presents to ER with 1 week hx of worsening cellulitis of abdominal wall.  Patient states that she has been using hot compresses on the area of erythema on her abdominal wall.  She had spontaneous drainage about 2 to 3 days ago.  She was seen in urgent care and given a prescription for clindamycin which she has been taking for the last several days.  She states that the drainage has been bloody and sometimes white.  Has a bad smell to it.  She has had multiple abscesses on her belly in the past.  Patient denies any recent fevers.  No nausea.  No vomiting.  In the ER, patient was afebrile.  Her laboratory evaluation is largely unremarkable except for glucose of 150.  Lactic acid is normal at 1.6.  EDP performed an I&D of the abscess.  Patient started on IV vancomycin.  I asked for a wound culture along with a CT scan to evaluate for deeper abscess.  [facility] contacted for admission.    ED Course: I&D of abscess per EDP. Started on Vanco.  Review of Systems:  Review of Systems  Constitutional:  Negative for chills and fever.  HENT: Negative.    Eyes: Negative.   Respiratory: Negative.    Cardiovascular: Negative.   Gastrointestinal: Negative.   Genitourinary: Negative.   Musculoskeletal:  Positive for back pain.       Chronic back pain  Skin:        Increased erythema on right side of abd wall. Increased tenderness and spontaneous drainage of abscess  Neurological: Negative.   Endo/Heme/Allergies: Negative.   Psychiatric/Behavioral:  The patient is nervous/anxious.   All other systems reviewed and are negative.  Past Medical History:  Diagnosis  Date   Anxiety    CHF (congestive heart failure) (HCC)    Diabetes mellitus    Hypercholesteremia    Hypertension    Obesity    Seizures ([place])     Past Surgical History:  Procedure Laterality Date   ABDOMINAL HYSTERECTOMY     ABDOMINAL SURGERY     CHOLECYSTECTOMY     JOINT REPLACEMENT       reports that she has never smoked. She has never used smokeless tobacco. She reports that she does not drink alcohol and does not use drugs.  Allergies  Allergen Reactions   Latex Other (See Comments) and Hives   Penicillins     Tolerated ceftriaxone 10/2020    Family History  Adopted: Yes    Prior to Admission medications   Medication Sig Start Date End Date Taking? Authorizing Provider  albuterol (VENTOLIN HFA) 108 (90 Base) MCG/ACT inhaler Inhale 2 puffs into the lungs every 4 (four) hours as needed for shortness of breath. 06/06/20  Yes [provider]  busPIRone (BUSPAR) 10 MG tablet Take 20 mg by mouth 3 (three) times daily. 08/16/20  Yes [provider]  clindamycin (CLEOCIN) 300 MG capsule Take 300 mg by mouth See admin instructions. Qid x 10 days 05/31/21  Yes [provider]  DULoxetine (CYMBALTA) 60 MG capsule Take 60 mg by mouth 2 (two) times daily.   Yes [provider]  ezetimibe (  ZETIA) 10 MG tablet Take 1 tablet by mouth daily. 07/25/18  Yes [provider]  fluticasone furoate-vilanterol (BREO ELLIPTA) 100-25 MCG/INH AEPB Inhale 1 puff into the lungs daily. 03/08/20  Yes [provider]  furosemide (LASIX) 40 MG tablet Take 40 mg by mouth daily. 09/27/20  Yes [provider]  hydrOXYzine (ATARAX) 25 MG tablet Take 25 mg by mouth 3 (three) times daily. 05/23/21  Yes [provider]  Hyoscyamine Sulfate SL 0.125 MG SUBL Place 1 tablet under the tongue 3 (three) times daily as needed (stomach cramps). 08/23/18  Yes [provider]  IBU 800 MG tablet Take 1 tablet (800 mg total) by mouth every 8 (eight)  hours as needed for moderate pain. 10/22/20  Yes Johnson, Clanford L, MD  insulin regular human CONCENTRATED (HUMULIN R) 500 UNIT/ML injection Inject 12-20 Units into the skin See admin instructions. Sliding scale 3 times daily   Yes [provider]  loratadine (CLARITIN) 10 MG tablet Take 10 mg by mouth daily.   Yes [provider]  losartan (COZAAR) 50 MG tablet Take 1 tablet by mouth daily. 09/21/20  Yes [provider]  omeprazole (PRILOSEC) 40 MG capsule Take 40 mg by mouth daily. 06/05/20  Yes [provider]  phenytoin (DILANTIN) 100 MG ER capsule Take 400 mg by mouth daily.   Yes [provider]  rosuvastatin (CRESTOR) 40 MG tablet Take 1 tablet by mouth daily. 12/22/18  Yes [provider]  Semaglutide,0.25 or 0.5MG /DOS, 2 MG/1.5ML SOPN Inject 0.25 mg into the skin every Friday. 05/20/21  Yes [provider]  tiZANidine (ZANAFLEX) 4 MG tablet Take 8 mg by mouth at bedtime. 09/01/20  Yes [provider]  topiramate (TOPAMAX) 100 MG tablet Take 200 mg by mouth 2 (two) times daily. 06/06/20  Yes [provider]  traZODone (DESYREL) 150 MG tablet Take 150 mg by mouth at bedtime. 08/17/20  Yes [provider]  warfarin (COUMADIN) 7.5 MG tablet Take 1 tablet (7.5 mg total) by mouth daily at 4 PM. Patient taking differently: Take 3.75-7.5 mg by mouth See admin instructions. 3.75 mg Tuesday,Thursday,Saturday and Sunday  7.5 mg Monday,Wednesday and Friday 10/22/20 06/03/21 Yes Johnson, Clanford L, MD  budesonide-formoterol (SYMBICORT) 80-4.5 MCG/ACT inhaler Inhale 2 puffs into the lungs 2 (two) times daily. Patient not taking: Reported on 06/03/2021 11/22/18   [provider]  cyclobenzaprine (FLEXERIL) 10 MG tablet Take 1 tablet (10 mg total) by mouth 2 (two) times daily as needed for muscle spasms. Patient not taking: Reported on 06/03/2021 03/07/15   Dorie Rank, MD  atorvastatin (LIPITOR) 40 MG tablet Take 40 mg  by mouth daily.  10/21/20  [provider]  citalopram (CELEXA) 20 MG tablet Take by mouth. Patient not taking: Reported on 10/15/2020  10/21/20  [provider]  dicyclomine (BENTYL) 10 MG capsule Take by mouth. Patient not taking: Reported on 10/15/2020  10/21/20  [provider]  fexofenadine (ALLEGRA) 180 MG tablet Take 180 mg by mouth daily.    11/27/19  [provider]  glipiZIDE (GLUCOTROL) 10 MG tablet Take 10 mg by mouth 2 (two) times daily before a meal.    11/27/19  [provider]  lisinopril-hydrochlorothiazide (PRINZIDE,ZESTORETIC) 10-12.5 MG per tablet Take 1 tablet by mouth daily.   Patient not taking: No sig reported  10/21/20  [provider]  sitaGLIPtan-metformin (JANUMET) 50-1000 MG per tablet Take 1 tablet by mouth 2 (two) times daily with a meal.  11/27/19  [provider]  zolpidem (AMBIEN) 10 MG tablet Take 10 mg by mouth at bedtime as needed.    11/27/19  [provider]    Physical Exam: Vitals:   06/02/21 1559 06/02/21 1617 06/02/21 1928 06/02/21 2302  BP: (!) 135/52  (!) 141/65 (!) 146/72  Pulse: 83  84 60  Resp: 18  20 16   Temp: 98.9 F (37.2 C)  98 F (36.7 C)   TempSrc: Oral     SpO2: 95%  98% 99%  Weight:  (!) 143.8 kg    Height:  5\' 3"  (1.6 m)      Physical Exam Vitals and nursing note reviewed.  Constitutional:      General: She is not in acute distress.    Appearance: Normal appearance. She is obese. She is not ill-appearing, toxic-appearing or diaphoretic.  HENT:     Head: Normocephalic and atraumatic.     Nose: Nose normal. No rhinorrhea.  Eyes:     General: No scleral icterus. Cardiovascular:     Rate and Rhythm: Normal rate and regular rhythm.     Heart sounds: Normal heart sounds.  Pulmonary:     Effort: Pulmonary effort is normal. No respiratory distress.     Breath sounds: Normal breath sounds. No wheezing or rales.  Abdominal:     General: Bowel sounds are normal. There  is no distension.     Tenderness: There is no abdominal tenderness.  Musculoskeletal:     Right lower leg: 1+ Edema present.     Left lower leg: 1+ Edema present.  Skin:    General: Skin is warm and dry.     Capillary Refill: Capillary refill takes less than 2 seconds.     Findings: Erythema and lesion present.     Comments: See picture of abd erythema  Neurological:     General: No focal deficit present.     Mental Status: She is alert and oriented to person, place, and time.      Labs on Admission: I have personally reviewed following labs and imaging studies  CBC: Recent Labs  Lab 06/02/21 1642  WBC 9.9  NEUTROABS 7.2  HGB 12.3  HCT 37.8  MCV 87.7  PLT [id number]   Basic Metabolic Panel: Recent Labs  Lab 06/02/21 1642  NA 142  K 4.0  CL 105  CO2 24  GLUCOSE 150*  BUN 18  CREATININE 0.85  CALCIUM 9.4   GFR: Estimated Creatinine Clearance: 87.8 mL/min (by C-G formula based on SCr of 0.85 mg/dL). Liver Function Tests: Recent Labs  Lab 06/02/21 1642  AST 14*  ALT 21  ALKPHOS 124  BILITOT 0.2*  PROT 7.2  ALBUMIN 2.8*   No results for input(s): LIPASE, AMYLASE in the last 168 hours. No results for input(s): AMMONIA in the last 168 hours. Coagulation Profile: No results for input(s): INR, PROTIME in the last 168 hours. Cardiac Enzymes: No results for input(s): CKTOTAL, CKMB, CKMBINDEX, TROPONINI in the last 168 hours. BNP (last 3 results) No results for input(s): PROBNP in the last 8760 hours. HbA1C: No results for input(s): HGBA1C in the last 72 hours. CBG: No results for input(s): GLUCAP in the last 168 hours. Lipid Profile: No results for input(s): CHOL, HDL, LDLCALC, TRIG, CHOLHDL, LDLDIRECT in the last 72 hours. Thyroid Function Tests: No results for input(s): TSH, T4TOTAL, FREET4, T3FREE, THYROIDAB in the last 72 hours. Anemia Panel: No results for input(s): VITAMINB12, FOLATE, FERRITIN, TIBC, IRON, RETICCTPCT in the last  72 hours. Urine analysis:     Component Value Date/Time   COLORURINE YELLOW 10/15/2020 [place] 10/15/2020 1541   LABSPEC 1.016 10/15/2020 1541   PHURINE 7.0 10/15/2020 1541   GLUCOSEU >=500 (A) 10/15/2020 1541   HGBUR NEGATIVE 10/15/2020 1541   BILIRUBINUR NEGATIVE 10/15/2020 1541   KETONESUR 20 (A) 10/15/2020 1541   PROTEINUR NEGATIVE 10/15/2020 1541   UROBILINOGEN 1.0 11/21/2014 1800   NITRITE NEGATIVE 10/15/2020 1541   LEUKOCYTESUR NEGATIVE 10/15/2020 1541    Radiological Exams on Admission: I have personally reviewed images No results found.  EKG: I have personally reviewed EKG: no EKG performed.  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 Have asked EDP to culture abscess drainage and order CT abd/pelvis to evaluate for deeper pockets of abscess.  Continue with IV vanco for now.  Pt's MRSA screening in 6-22 was negative. Prn oxycodone for pain      Essential hypertension Stable.  GERD (gastroesophageal reflux disease) Stable.  AF (paroxysmal atrial fibrillation) (HCC) Stable. On coumadin.  Chronic anticoagulation - on coumadin for afib Will ask for pharmacy to dose coumadin  Morbid obesity with BMI of 50.0-59.9, adult (HCC) Chronic.  Anxiety state Continue home meds.  OSA (obstructive sleep apnea) Stable.  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

## 2021-06-03 NOTE — Consult Note (Addendum)
WOC Nurse Consult Note: Patient receiving care in [facility] ED037 Reason for Consult: Abdominal wound Wound type: RLQ Abdominal abscess. History of these abscesses. Was consulted by Dr. Henreitta Leber with [facility] in June of 2022 and this abscess was drained. I would recommend a consult or an MD in the ED to drain this area again [facility] nurses can not do this), pack the wound and continue the systemic ABX as ordered. Place a foam dressing over the wound for drainage until this can be assessed by MD or PA. Pressure Injury POA: NA  Monitor the wound area(s) for worsening of condition such as: Signs/symptoms of infection, increase in size, development of or worsening of odor, development of pain, or increased pain at the affected locations.   Notify the medical team if any of these develop.  Thank you for the consult. WOC nurse will not follow at this time.   Please re-consult the WOC team if needed.  Donna Snow Donna Blazing, MSN, RN, CMSRN, Angus Seller, [facility] Wound Treatment Associate Pager [phone]

## 2021-06-03 NOTE — Hospital Course (Signed)
71 yo WF with hx of morbid obesity, PAF, HTN, DM2, anxiety, presents to ER with 1 week hx of worsening cellulitis of abdominal wall.  Patient states that she has been using hot compresses on the area of erythema on her abdominal wall.  She had spontaneous drainage about 2 to 3 days ago.  She was seen in urgent care and given a prescription for clindamycin which she has been taking for the last several days.  She states that the drainage has been bloody and sometimes white.  Has a bad smell to it.  She has had multiple abscesses on her belly in the past.

## 2021-06-03 NOTE — Consult Note (Signed)
WOC Nurse wound follow up Abdominal wound abscess I & D and culture has been completed. I & D site is 0.5 cm x 0.5 cm x 3.4 cm   Pack the abdominal I & D site with 1/2" Iodoform packing strip [employer] [place] # [id number]) twice daily. Cover with ABD pad and Medipore tape or foam dressing.   Monitor the wound area(s) for worsening of condition such as: Signs/symptoms of infection, increase in size, development of or worsening of odor, development of pain, or increased pain at the affected locations.   Notify the medical team if any of these develop.  Thank you for the consult. WOC nurse will not follow at this time.   Please re-consult the WOC team if needed.  Renaldo Reel Katrinka Blazing, MSN, RN, CMSRN, Angus Seller, [facility] Wound Treatment Associate Pager [phone]

## 2021-06-03 NOTE — Progress Notes (Signed)
[place] for warfarin Indication: atrial fibrillation  Labs: Recent Labs    06/02/21 1642 06/03/21 0326  HGB 12.3 12.0  HCT 37.8 36.8  PLT 307 333  LABPROT  --  31.3*  INR  --  3.0*  CREATININE 0.85 0.74    Assessment: 26 yom with hx of afib on warfarin presenting with worsening abdominal wall cellulitis, failed outpatient therapy with clindamycin. Pharmacy consulted to dose warfarin inpatient. INR on admission is at the top of therapeutic range at 3.0. CBC WNL. No active bleed issues reported.  PTA warfarin dose - 3.75mg  daily except 7.5mg  on MWF (last dose 1/19 per med rec)  Goal of Therapy:  INR 2-3 Monitor platelets by anticoagulation protocol: Yes   Plan:  Warfarin 7.5mg  PO x 1 dose at 1600 - continue home dose for now, f/u INR trend closely Monitor daily INR, CBC, s/sx bleeding   Arturo Morton, PharmD, BCPS Clinical Pharmacist 06/03/2021 7:44 AM

## 2021-06-03 NOTE — Subjective & Objective (Signed)
CC: abd wall abscess HPI: 71 yo WF with hx of morbid obesity, PAF, HTN, DM2, anxiety, presents to ER with 1 week hx of worsening cellulitis of abdominal wall.  Patient states that she has been using hot compresses on the area of erythema on her abdominal wall.  She had spontaneous drainage about 2 to 3 days ago.  She was seen in urgent care and given a prescription for clindamycin which she has been taking for the last several days.  She states that the drainage has been bloody and sometimes white.  Has a bad smell to it.  She has had multiple abscesses on her belly in the past.  Patient denies any recent fevers.  No nausea.  No vomiting.  In the ER, patient was afebrile.  Her laboratory evaluation is largely unremarkable except for glucose of 150.  Lactic acid is normal at 1.6.  EDP performed an I&D of the abscess.  Patient started on IV vancomycin.  I asked for a wound culture along with a CT scan to evaluate for deeper abscess.  [facility] hospitalist contacted for admission.

## 2021-06-03 NOTE — Assessment & Plan Note (Addendum)
Stable

## 2021-06-03 NOTE — Assessment & Plan Note (Addendum)
Continue dilantin.  

## 2021-06-03 NOTE — ED Notes (Signed)
Patient reported to be treating herself with her own insulin to stay on her own home regimen. This RN instructed patient that while in the hospital we will treat with our sliding scale. Pt reported to understand at this time.

## 2021-06-03 NOTE — Progress Notes (Signed)
Pt. Refused cpap. RT informed pt. To notify if she changes her mind. 

## 2021-06-03 NOTE — Assessment & Plan Note (Addendum)
-   Continue home meds °

## 2021-06-03 NOTE — Assessment & Plan Note (Addendum)
-  Patient was admitted to the hospital, in the ED she underwent I&D, and was placed on vancomycin.  Cultures from the I&D grew GPC and GPR, showing Streptococcus intermedius and Streptococcus group F.  Case was briefly discussed with Dr. Drue Second with ID over the phone.  Clinically has improved, will be transition to cefadroxil and discharged home in stable condition.  She has penicillin allergy however has tolerated cephalosporins in the past.

## 2021-06-03 NOTE — ED Notes (Signed)
Pt requesting to have a bed bath prior to being hooked up to IV infusion. Will give IV Vanc after bed bath. Pt given bathing supplies at this time.

## 2021-06-03 NOTE — Progress Notes (Signed)
Patient seen and examined this morning, admitted overnight, H&P reviewed and agree with assessment and plan.  71 year old female with obesity class III, PAF, HTN, DM2, anxiety comes into the hospital with cellulitis of the abdominal wall as well as an abscess.  She has been using hot compresses in the area and had few episodes of spontaneous drainage several days ago.  She was seen at urgent care, was given clindamycin, she took at home but given progression of\ her cellulitis has come to the hospital.  Abdominal wall abscess-has been started on IV antibiotics, continue.  She failed oral antibiotics having clindamycin at home for a few days prior to admission.  She received I&D by EDP, monitor cultures  Seiji Wiswell M. Elvera Lennox, MD, PhD [facility]  Between 7 am - 7 pm you can contact me via Amion (for emergencies) or Securechat (non urgent matters).  I am not available 7 pm - 7 am, please contact night coverage MD/APP via Amion

## 2021-06-03 NOTE — Assessment & Plan Note (Addendum)
-  chronic.Patient would benefit from weight loss

## 2021-06-04 DIAGNOSIS — L03311 Cellulitis of abdominal wall: Secondary | ICD-10-CM | POA: Diagnosis present

## 2021-06-04 DIAGNOSIS — I5032 Chronic diastolic (congestive) heart failure: Secondary | ICD-10-CM | POA: Diagnosis present

## 2021-06-04 DIAGNOSIS — Z7901 Long term (current) use of anticoagulants: Secondary | ICD-10-CM | POA: Diagnosis not present

## 2021-06-04 DIAGNOSIS — L0390 Cellulitis, unspecified: Secondary | ICD-10-CM | POA: Diagnosis present

## 2021-06-04 DIAGNOSIS — Z20822 Contact with and (suspected) exposure to covid-19: Secondary | ICD-10-CM | POA: Diagnosis present

## 2021-06-04 DIAGNOSIS — Z79899 Other long term (current) drug therapy: Secondary | ICD-10-CM | POA: Diagnosis not present

## 2021-06-04 DIAGNOSIS — I480 Paroxysmal atrial fibrillation: Secondary | ICD-10-CM | POA: Diagnosis present

## 2021-06-04 DIAGNOSIS — G40909 Epilepsy, unspecified, not intractable, without status epilepticus: Secondary | ICD-10-CM | POA: Diagnosis present

## 2021-06-04 DIAGNOSIS — Z6841 Body Mass Index (BMI) 40.0 and over, adult: Secondary | ICD-10-CM | POA: Diagnosis not present

## 2021-06-04 DIAGNOSIS — Z91040 Latex allergy status: Secondary | ICD-10-CM | POA: Diagnosis not present

## 2021-06-04 DIAGNOSIS — E1165 Type 2 diabetes mellitus with hyperglycemia: Secondary | ICD-10-CM | POA: Diagnosis present

## 2021-06-04 DIAGNOSIS — L02211 Cutaneous abscess of abdominal wall: Secondary | ICD-10-CM | POA: Diagnosis present

## 2021-06-04 DIAGNOSIS — Z880 Allergy status to penicillin: Secondary | ICD-10-CM | POA: Diagnosis not present

## 2021-06-04 DIAGNOSIS — K219 Gastro-esophageal reflux disease without esophagitis: Secondary | ICD-10-CM | POA: Diagnosis present

## 2021-06-04 DIAGNOSIS — I110 Hypertensive heart disease with heart failure: Secondary | ICD-10-CM | POA: Diagnosis present

## 2021-06-04 DIAGNOSIS — G4733 Obstructive sleep apnea (adult) (pediatric): Secondary | ICD-10-CM | POA: Diagnosis present

## 2021-06-04 DIAGNOSIS — E7800 Pure hypercholesterolemia, unspecified: Secondary | ICD-10-CM | POA: Diagnosis present

## 2021-06-04 DIAGNOSIS — F411 Generalized anxiety disorder: Secondary | ICD-10-CM | POA: Diagnosis present

## 2021-06-04 DIAGNOSIS — Z794 Long term (current) use of insulin: Secondary | ICD-10-CM | POA: Diagnosis not present

## 2021-06-04 DIAGNOSIS — F419 Anxiety disorder, unspecified: Secondary | ICD-10-CM | POA: Diagnosis present

## 2021-06-04 HISTORY — DX: Cellulitis, unspecified: L03.90

## 2021-06-04 LAB — CBC
HCT: 34.7 % — ABNORMAL LOW (ref 36.0–46.0)
Hemoglobin: 11.4 g/dL — ABNORMAL LOW (ref 12.0–15.0)
MCH: 28.1 pg (ref 26.0–34.0)
MCHC: 32.9 g/dL (ref 30.0–36.0)
MCV: 85.7 fL (ref 80.0–100.0)
Platelets: 331 10*3/uL (ref 150–400)
RBC: 4.05 MIL/uL (ref 3.87–5.11)
RDW: 13.5 % (ref 11.5–15.5)
WBC: 8.5 10*3/uL (ref 4.0–10.5)
nRBC: 0 % (ref 0.0–0.2)

## 2021-06-04 LAB — BASIC METABOLIC PANEL
Anion gap: 6 (ref 5–15)
BUN: 11 mg/dL (ref 8–23)
CO2: 22 mmol/L (ref 22–32)
Calcium: 8.3 mg/dL — ABNORMAL LOW (ref 8.9–10.3)
Chloride: 108 mmol/L (ref 98–111)
Creatinine, Ser: 0.73 mg/dL (ref 0.44–1.00)
GFR, Estimated: >60 mL/min (ref >60)
Glucose, Bld: 235 mg/dL — ABNORMAL HIGH (ref 70–99)
Potassium: 3.6 mmol/L (ref 3.5–5.1)
Sodium: 136 mmol/L (ref 135–145)

## 2021-06-04 LAB — PROTIME-INR
INR: 3.7 — ABNORMAL HIGH (ref 0.8–1.2)
Prothrombin Time: 36.7 seconds — ABNORMAL HIGH (ref 11.4–15.2)

## 2021-06-04 LAB — GLUCOSE, CAPILLARY
Glucose-Capillary: 204 mg/dL — ABNORMAL HIGH (ref 70–99)
Glucose-Capillary: 225 mg/dL — ABNORMAL HIGH (ref 70–99)
Glucose-Capillary: 232 mg/dL — ABNORMAL HIGH (ref 70–99)
Glucose-Capillary: 247 mg/dL — ABNORMAL HIGH (ref 70–99)
Glucose-Capillary: 251 mg/dL — ABNORMAL HIGH (ref 70–99)

## 2021-06-04 MED ORDER — WARFARIN SODIUM 3 MG PO TABS
3.5000 mg | ORAL_TABLET | Freq: Once | ORAL | Status: AC
  Administered 2021-06-04: 3.5 mg via ORAL
  Filled 2021-06-04: qty 1

## 2021-06-04 MED ORDER — OXYCODONE HCL 5 MG PO TABS
10.0000 mg | ORAL_TABLET | ORAL | Status: DC | PRN
  Administered 2021-06-04 – 2021-06-06 (×7): 10 mg via ORAL
  Filled 2021-06-04 (×8): qty 2

## 2021-06-04 NOTE — Progress Notes (Signed)
PROGRESS NOTE  Donna Snow [id number] DOB: [birth year] DOA: 06/02/2021 PCP: [place]   LOS: 0 days   Brief Narrative / Interim history: 71 yo WF with hx of morbid obesity, PAF, HTN, DM2, anxiety, presents to ER with 1 week hx of worsening cellulitis of abdominal wall.  Patient states that she has been using hot compresses on the area of erythema on her abdominal wall.  She had spontaneous drainage about 2 to 3 days ago.  She was seen in urgent care and given a prescription for clindamycin which she has been taking for the last several days.  She states that the drainage has been bloody and sometimes white.  Has a bad smell to it.  She has had multiple abscesses on her belly in the past.  Subjective / 24h Interval events: Continues to complain of pain at the infectious site, especially when she gets dressing change  Assessment & Plan: * Abdominal wall abscess- (present on admission) - Wound improving, continue local care, continue IV antibiotics for today.  Cultures with gram-positive cocci and gram-positive rods, continue to monitor speciation.  She failed outpatient antibiotics and it is crucial to have targeted data for her antibiotics.  AF (paroxysmal atrial fibrillation) ([place])- (present on admission) Stable. On coumadin.  Essential hypertension- (present on admission) Stable.  Nonintractable epilepsy without status epilepticus ([place]) -Continue dilantin  OSA (obstructive sleep apnea)- (present on admission) -Stable.  Anxiety state- (present on admission) Continue home meds.  Morbid obesity with BMI of 50.0-59.9, adult (HCC) -chronic.Patient would benefit from weight loss  Chronic anticoagulation - on coumadin for afib Will ask for pharmacy to dose coumadin  GERD (gastroesophageal reflux disease)- (present on admission) Stable.    Scheduled Meds:  busPIRone  20 mg Oral TID   DULoxetine  60 mg Oral BID   ezetimibe  10 mg Oral Daily   fluticasone  furoate-vilanterol  1 puff Inhalation Daily   hydrOXYzine  25 mg Oral TID   insulin aspart  0-20 Units Subcutaneous TID WC   insulin aspart  0-5 Units Subcutaneous QHS   nystatin cream   Topical BID   pantoprazole  40 mg Oral Daily   phenytoin  400 mg Oral Daily   polyethylene glycol  17 g Oral Daily   rosuvastatin  40 mg Oral Daily   tiZANidine  8 mg Oral QHS   topiramate  200 mg Oral BID   traZODone  150 mg Oral QHS   warfarin  3.5 mg Oral ONCE-1600   Warfarin - Pharmacist Dosing Inpatient   Does not apply q1600   Continuous Infusions:  vancomycin 1,750 mg (06/04/21 0943)   PRN Meds:.acetaminophen **OR** acetaminophen, benzonatate, melatonin, ondansetron **OR** ondansetron (ZOFRAN) IV, oxyCODONE  Diet Orders (From admission, onward)     Start     Ordered   06/03/21 0216  Diet heart healthy/carb modified Room service appropriate? Yes; Fluid consistency: Thin  Diet effective now       Question Answer Comment  Diet-HS Snack? Nothing   Room service appropriate? Yes   Fluid consistency: Thin      06/03/21 0215            DVT prophylaxis: SCDs Start: 06/03/21 0216 warfarin (COUMADIN) tablet 3.5 mg   Lab Results  Component Value Date   PLT 331 06/04/2021      Code Status: Full Code  Family Communication: No family at bedside  Status is: Inpatient  Remains inpatient appropriate because: IV antibiotics  Level of care:  Med-Surg  Consultants:  None  Procedures:  I&D abdominal wall abscess  Microbiology  Culture shows gram-positive cocci, gram-positive rods  Antimicrobials: IV vancomycin 1/20   Objective: Vitals:   06/03/21 1712 06/03/21 2054 06/04/21 0443 06/04/21 0813  BP: (!) 126/51 (!) 147/54 (!) 146/61 (!) 147/47  Pulse: 66 72 69 78  Resp: 19 20 18 16   Temp: 98 F (36.7 C) 98 F (36.7 C) 98.2 F (36.8 C) 98.3 F (36.8 C)  TempSrc: Oral Oral Oral Oral  SpO2: 98% 97% 93% 95%  Weight:      Height:       No intake or output data in the 24  hours ending 06/04/21 1139 Wt Readings from Last 3 Encounters:  06/02/21 (!) 143.8 kg  10/21/20 (!) 149.4 kg  04/10/20 128.4 kg    Examination:  Constitutional: NAD Eyes: no scleral icterus ENMT: Mucous membranes are moist.  Neck: normal, supple Respiratory: clear to auscultation bilaterally, no wheezing, no crackles.  Cardiovascular: Regular rate and rhythm, no murmurs / rubs / gallops.  Abdomen: non distended, no tenderness. Bowel sounds positive.  Musculoskeletal: no clubbing / cyanosis.  Skin: Improving cellulitis and abdominal wall Neurologic: Nonfocal   Data Reviewed: I have independently reviewed following labs and imaging studies   CBC Recent Labs  Lab 06/02/21 1642 06/03/21 0326 06/04/21 0049  WBC 9.9 9.4 8.5  HGB 12.3 12.0 11.4*  HCT 37.8 36.8 34.7*  PLT 307 333 331  MCV 87.7 87.2 85.7  MCH 28.5 28.4 28.1  MCHC 32.5 32.6 32.9  RDW 13.7 13.7 13.5  LYMPHSABS 1.7 2.1  --   MONOABS 0.7 0.6  --   EOSABS 0.2 0.2  --   BASOSABS 0.0 0.0  --     Recent Labs  Lab 06/02/21 1642 06/02/21 1643 06/03/21 0326 06/04/21 0049  NA 142  --  138 136  K 4.0  --  3.5 3.6  CL 105  --  106 108  CO2 24  --  22 22  GLUCOSE 150*  --  180* 235*  BUN 18  --  15 11  CREATININE 0.85  --  0.74 0.73  CALCIUM 9.4  --  8.4* 8.3*  AST 14*  --  15  --   ALT 21  --  20  --   ALKPHOS 124  --  127*  --   BILITOT 0.2*  --  0.7  --   ALBUMIN 2.8*  --  2.7*  --   LATICACIDVEN  --  1.6 1.0  --   INR  --   --  3.0* 3.7*  HGBA1C  --   --  7.5*  --     ------------------------------------------------------------------------------------------------------------------ No results for input(s): CHOL, HDL, LDLCALC, TRIG, CHOLHDL, LDLDIRECT in the last 72 hours.  Lab Results  Component Value Date   HGBA1C 7.5 (H) 06/03/2021   ------------------------------------------------------------------------------------------------------------------ No results for input(s): TSH, T4TOTAL, T3FREE,  THYROIDAB in the last 72 hours.  Invalid input(s): FREET3  Cardiac Enzymes No results for input(s): CKMB, TROPONINI, MYOGLOBIN in the last 168 hours.  Invalid input(s): CK ------------------------------------------------------------------------------------------------------------------    Component Value Date/Time   BNP 178.0 (H) 10/17/2020 0148    CBG: Recent Labs  Lab 06/03/21 1726 06/03/21 2100 06/04/21 0012 06/04/21 0837 06/04/21 1127  GLUCAP 170* 181* 232* 251* 204*    Recent Results (from the past 240 hour(s))  Blood culture (routine x 2)     Status: None (Preliminary result)   Collection Time: 06/02/21  4:43 PM   Specimen: BLOOD RIGHT FOREARM  Result Value Ref Range Status   Specimen Description BLOOD RIGHT FOREARM  Final   Special Requests   Final    BOTTLES DRAWN AEROBIC AND ANAEROBIC Blood Culture adequate volume   Culture   Final    NO GROWTH 2 DAYS Performed at [facility], [address] [place], [place] [address]    Report Status PENDING  Incomplete  Aerobic/Anaerobic Culture w Gram Stain (surgical/deep wound)     Status: None (Preliminary result)   Collection Time: 06/03/21  1:12 AM   Specimen: Wound  Result Value Ref Range Status   Specimen Description WOUND  Final   Special Requests NONE  Final   Gram Stain   Final    RARE WBC PRESENT,BOTH PMN AND MONONUCLEAR RARE GRAM POSITIVE COCCI RARE GRAM POSITIVE RODS    Culture   Final    CULTURE REINCUBATED FOR BETTER GROWTH Performed at [facility], [place] [address] [place], [place] [address]    Report Status PENDING  Incomplete  Resp Panel by RT-PCR (Flu A&B, Covid) Nasopharyngeal Swab     Status: None   Collection Time: 06/03/21  1:14 AM   Specimen: Nasopharyngeal Swab; Nasopharyngeal(NP) swabs in vial transport medium  Result Value Ref Range Status   SARS Coronavirus 2 by RT PCR NEGATIVE NEGATIVE Final    Comment: (NOTE) SARS-CoV-2 target nucleic acids are NOT DETECTED.  The  SARS-CoV-2 RNA is generally detectable in upper respiratory specimens during the acute phase of infection. The lowest concentration of SARS-CoV-2 viral copies this assay can detect is 138 copies/mL. A negative result does not preclude SARS-Cov-2 infection and should not be used as the sole basis for treatment or other patient management decisions. A negative result may occur with  improper specimen collection/handling, submission of specimen other than nasopharyngeal swab, presence of viral mutation(s) within the areas targeted by this assay, and inadequate number of viral copies(<138 copies/mL). A negative result must be combined with clinical observations, patient history, and epidemiological information. The expected result is Negative.  Fact Sheet for Patients:  EntrepreneurPulse.com.au  Fact Sheet for Healthcare Providers:  IncredibleEmployment.be  This test is no t yet approved or cleared by the [place] FDA and  has been authorized for detection and/or diagnosis of SARS-CoV-2 by FDA under an Emergency Use Authorization (EUA). This EUA will remain  in effect (meaning this test can be used) for the duration of the COVID-19 declaration under Section 564(b)(1) of the Act, 21 U.S.C.section 360bbb-3(b)(1), unless the authorization is terminated  or revoked sooner.       Influenza A by PCR NEGATIVE NEGATIVE Final   Influenza B by PCR NEGATIVE NEGATIVE Final    Comment: (NOTE) The Xpert Xpress SARS-CoV-2/FLU/RSV plus assay is intended as an aid in the diagnosis of influenza from Nasopharyngeal swab specimens and should not be used as a sole basis for treatment. Nasal washings and aspirates are unacceptable for Xpert Xpress SARS-CoV-2/FLU/RSV testing.  Fact Sheet for Patients: EntrepreneurPulse.com.au  Fact Sheet for Healthcare Providers: IncredibleEmployment.be  This test is not yet approved or  cleared by the [place] FDA and has been authorized for detection and/or diagnosis of SARS-CoV-2 by FDA under an Emergency Use Authorization (EUA). This EUA will remain in effect (meaning this test can be used) for the duration of the COVID-19 declaration under Section 564(b)(1) of the Act, 21 U.S.C. section 360bbb-3(b)(1), unless the authorization is terminated or revoked.  Performed at [facility]  [facility], [place] [address], [place], [place] [address]      Radiology Studies: No results found.   Marzetta Board, MD, PhD [facility]  Between 7 am - 7 pm I am available, please contact me via Amion (for emergencies) or Securechat (non urgent messages)  Between 7 pm - 7 am I am not available, please contact night coverage MD/APP via Amion

## 2021-06-04 NOTE — Progress Notes (Signed)
[name] for warfarin Indication: atrial fibrillation  Labs: Recent Labs    06/02/21 1642 06/03/21 0326 06/04/21 0049  HGB 12.3 12.0 11.4*  HCT 37.8 36.8 34.7*  PLT 307 333 331  LABPROT  --  31.3* 36.7*  INR  --  3.0* 3.7*  CREATININE 0.85 0.74 0.73     Assessment: Donna Snow with hx of afib on warfarin presenting with worsening abdominal wall cellulitis, failed outpatient therapy with clindamycin. Pharmacy consulted to dose warfarin inpatient. INR on admission is at the top of therapeutic range at 3.0. CBC WNL. No active bleed issues reported.  PTA warfarin dose - 3.75mg  daily except 7.5mg  on MWF (last dose 1/19 per med rec)  1/21: INR 3.7, Hgb 11.4, Plt 331. No signs or symptoms of bleeding reported. CBC WNL  Goal of Therapy:  INR 2-3 Monitor platelets by anticoagulation protocol: Yes   Plan:  Warfarin 3.5 mg PO x 1 dose at 1600 - continue home dose for now, f/u INR trend closely Monitor daily INR, CBC, s/sx bleeding   Lestine Box, PharmD PGY2 Infectious Diseases Pharmacy Resident   Please check [email or web address] for unit-specific pharmacy phone numbers

## 2021-06-05 LAB — PROTIME-INR
INR: 4.2 (ref 0.8–1.2)
Prothrombin Time: 40.4 seconds — ABNORMAL HIGH (ref 11.4–15.2)

## 2021-06-05 LAB — CBC
HCT: 35.6 % — ABNORMAL LOW (ref 36.0–46.0)
Hemoglobin: 11.6 g/dL — ABNORMAL LOW (ref 12.0–15.0)
MCH: 28.2 pg (ref 26.0–34.0)
MCHC: 32.6 g/dL (ref 30.0–36.0)
MCV: 86.6 fL (ref 80.0–100.0)
Platelets: 343 10*3/uL (ref 150–400)
RBC: 4.11 MIL/uL (ref 3.87–5.11)
RDW: 13.4 % (ref 11.5–15.5)
WBC: 7.6 10*3/uL (ref 4.0–10.5)
nRBC: 0 % (ref 0.0–0.2)

## 2021-06-05 LAB — BASIC METABOLIC PANEL
Anion gap: 9 (ref 5–15)
BUN: 12 mg/dL (ref 8–23)
CO2: 22 mmol/L (ref 22–32)
Calcium: 8.1 mg/dL — ABNORMAL LOW (ref 8.9–10.3)
Chloride: 107 mmol/L (ref 98–111)
Creatinine, Ser: 0.75 mg/dL (ref 0.44–1.00)
GFR, Estimated: >60 mL/min (ref >60)
Glucose, Bld: 286 mg/dL — ABNORMAL HIGH (ref 70–99)
Potassium: 3.9 mmol/L (ref 3.5–5.1)
Sodium: 138 mmol/L (ref 135–145)

## 2021-06-05 LAB — GLUCOSE, CAPILLARY
Glucose-Capillary: 320 mg/dL — ABNORMAL HIGH (ref 70–99)
Glucose-Capillary: 248 mg/dL — ABNORMAL HIGH (ref 70–99)
Glucose-Capillary: 234 mg/dL — ABNORMAL HIGH (ref 70–99)
Glucose-Capillary: 293 mg/dL — ABNORMAL HIGH (ref 70–99)

## 2021-06-05 MED ORDER — BENZONATATE 100 MG PO CAPS
200.0000 mg | ORAL_CAPSULE | Freq: Three times a day (TID) | ORAL | Status: DC | PRN
  Administered 2021-06-05: 200 mg via ORAL
  Filled 2021-06-05: qty 2

## 2021-06-05 NOTE — Progress Notes (Signed)
PROGRESS NOTE  Donna Snow [id number] DOB: 05-06-1951 DOA: 06/02/2021 PCP: [facility]   LOS: 1 day   Brief Narrative / Interim history: 71 yo WF with hx of morbid obesity, PAF, HTN, DM2, anxiety, presents to ER with 1 week hx of worsening cellulitis of abdominal wall.  Patient states that she has been using hot compresses on the area of erythema on her abdominal wall.  She had spontaneous drainage about 2 to 3 days ago.  She was seen in urgent care and given a prescription for clindamycin which she has been taking for the last several days.  She states that the drainage has been bloody and sometimes white.  Has a bad smell to it.  She has had multiple abscesses on her belly in the past.  Subjective / 24h Interval events: Pain is better.  Tolerates dressing changes  Assessment & Plan: * Abdominal wall abscess- (present on admission) - Wound improving, continue local care, continue IV antibiotics for another day.  Cultures with gram-positive cocci and gram-positive rods, growing strep, continue to monitor final sensitivities.  She failed outpatient antibiotics and it is important to have targeted data for her antibiotics.  AF (paroxysmal atrial fibrillation) ([facility])- (present on admission) -Stable, on Coumadin, managed per pharmacy.  INR 4 this morning  Essential hypertension- (present on admission) -Stable  Nonintractable epilepsy without status epilepticus ([place]) -Continue dilantin  OSA (obstructive sleep apnea)- (present on admission) -Stable.  Anxiety state- (present on admission) -Continue home meds.  Morbid obesity with BMI of 50.0-59.9, adult (HCC) -chronic.Patient would benefit from weight loss  Chronic anticoagulation - on coumadin for afib -On Coumadin  GERD (gastroesophageal reflux disease)- (present on admission) -Stable.    Scheduled Meds:  busPIRone  20 mg Oral TID   DULoxetine  60 mg Oral BID   ezetimibe  10 mg Oral Daily   fluticasone  furoate-vilanterol  1 puff Inhalation Daily   hydrOXYzine  25 mg Oral TID   insulin aspart  0-20 Units Subcutaneous TID WC   insulin aspart  0-5 Units Subcutaneous QHS   nystatin cream   Topical BID   pantoprazole  40 mg Oral Daily   phenytoin  400 mg Oral Daily   polyethylene glycol  17 g Oral Daily   rosuvastatin  40 mg Oral Daily   tiZANidine  8 mg Oral QHS   topiramate  200 mg Oral BID   traZODone  150 mg Oral QHS   Warfarin - Pharmacist Dosing Inpatient   Does not apply q1600   Continuous Infusions:  vancomycin 1,750 mg (06/04/21 0943)   PRN Meds:.acetaminophen **OR** acetaminophen, melatonin, ondansetron **OR** ondansetron (ZOFRAN) IV, oxyCODONE  Diet Orders (From admission, onward)     Start     Ordered   06/03/21 0216  Diet heart healthy/carb modified Room service appropriate? Yes; Fluid consistency: Thin  Diet effective now       Question Answer Comment  Diet-HS Snack? Nothing   Room service appropriate? Yes   Fluid consistency: Thin      06/03/21 0215            DVT prophylaxis: SCDs Start: 06/03/21 0216   Lab Results  Component Value Date   PLT 343 06/05/2021      Code Status: Full Code  Family Communication: No family at bedside  Status is: Inpatient  Remains inpatient appropriate because: IV antibiotics  Level of care: Med-Surg  Consultants:  None  Procedures:  I&D abdominal wall abscess  Microbiology  Culture shows gram-positive cocci, gram-positive rods  Antimicrobials: IV vancomycin 1/20   Objective: Vitals:   06/04/21 2101 06/05/21 0500 06/05/21 0555 06/05/21 0811  BP: (!) 133/49  (!) 130/58 (!) 127/43  Pulse: 74  61 60  Resp: 18  15 17   Temp: 98.3 F (36.8 C)  97.7 F (36.5 C) 97.7 F (36.5 C)  TempSrc: Oral  Oral Oral  SpO2: 96%  95% 98%  Weight:  (!) 145.9 kg    Height:        Intake/Output Summary (Last 24 hours) at 06/05/2021 0957 Last data filed at 06/04/2021 1600 Gross per 24 hour  Intake 350 ml  Output --   Net 350 ml   Wt Readings from Last 3 Encounters:  06/05/21 (!) 145.9 kg  10/21/20 (!) 149.4 kg  04/10/20 128.4 kg    Examination:  Constitutional: NAD Eyes: lids and conjunctivae normal, no scleral icterus ENMT: mmm Neck: normal, supple Respiratory: clear to auscultation bilaterally, no wheezing, no crackles. Normal respiratory effort.  Cardiovascular: Regular rate and rhythm, no murmurs / rubs / gallops. No LE edema. Abdomen: soft, no distention, no tenderness. Bowel sounds positive.  Skin: no new rashes.  Cellulitis improving Neurologic: no focal deficits, equal strength   Data Reviewed: I have independently reviewed following labs and imaging studies   CBC Recent Labs  Lab 06/02/21 1642 06/03/21 0326 06/04/21 0049 06/05/21 0031  WBC 9.9 9.4 8.5 7.6  HGB 12.3 12.0 11.4* 11.6*  HCT 37.8 36.8 34.7* 35.6*  PLT 307 333 331 343  MCV 87.7 87.2 85.7 86.6  MCH 28.5 28.4 28.1 28.2  MCHC 32.5 32.6 32.9 32.6  RDW 13.7 13.7 13.5 13.4  LYMPHSABS 1.7 2.1  --   --   MONOABS 0.7 0.6  --   --   EOSABS 0.2 0.2  --   --   BASOSABS 0.0 0.0  --   --      Recent Labs  Lab 06/02/21 1642 06/02/21 1643 06/03/21 0326 06/04/21 0049 06/05/21 0031  NA 142  --  138 136 138  K 4.0  --  3.5 3.6 3.9  CL 105  --  106 108 107  CO2 24  --  22 22 22   GLUCOSE 150*  --  180* 235* 286*  BUN 18  --  15 11 12   CREATININE 0.85  --  0.74 0.73 0.75  CALCIUM 9.4  --  8.4* 8.3* 8.1*  AST 14*  --  15  --   --   ALT 21  --  20  --   --   ALKPHOS 124  --  127*  --   --   BILITOT 0.2*  --  0.7  --   --   ALBUMIN 2.8*  --  2.7*  --   --   LATICACIDVEN  --  1.6 1.0  --   --   INR  --   --  3.0* 3.7* 4.2*  HGBA1C  --   --  7.5*  --   --      ------------------------------------------------------------------------------------------------------------------ No results for input(s): CHOL, HDL, LDLCALC, TRIG, CHOLHDL, LDLDIRECT in the last 72 hours.  Lab Results  Component Value Date   HGBA1C  7.5 (H) 06/03/2021   ------------------------------------------------------------------------------------------------------------------ No results for input(s): TSH, T4TOTAL, T3FREE, THYROIDAB in the last 72 hours.  Invalid input(s): FREET3  Cardiac Enzymes No results for input(s): CKMB, TROPONINI, MYOGLOBIN in the last 168 hours.  Invalid input(s): CK ------------------------------------------------------------------------------------------------------------------    Component  Value Date/Time   BNP 178.0 (H) 10/17/2020 0148    CBG: Recent Labs  Lab 06/04/21 0837 06/04/21 1127 06/04/21 1627 06/04/21 2158 06/05/21 0731  GLUCAP 251* 204* 225* 247* 320*     Recent Results (from the past 240 hour(s))  Blood culture (routine x 2)     Status: None (Preliminary result)   Collection Time: 06/02/21  4:43 PM   Specimen: BLOOD RIGHT FOREARM  Result Value Ref Range Status   Specimen Description BLOOD RIGHT FOREARM  Final   Special Requests   Final    BOTTLES DRAWN AEROBIC AND ANAEROBIC Blood Culture adequate volume   Culture   Final    NO GROWTH 3 DAYS Performed at [facility], [place] [address] [place], [place] [address]    Report Status PENDING  Incomplete  Aerobic/Anaerobic Culture w Gram Stain (surgical/deep wound)     Status: None (Preliminary result)   Collection Time: 06/03/21  1:12 AM   Specimen: Wound  Result Value Ref Range Status   Specimen Description WOUND  Final   Special Requests NONE  Final   Gram Stain   Final    RARE WBC PRESENT,BOTH PMN AND MONONUCLEAR RARE GRAM POSITIVE COCCI RARE GRAM POSITIVE RODS Performed at [facility], [address] [place], [place] [address]    Culture FEW STREPTOCOCCUS INTERMEDIUS  Final   Report Status PENDING  Incomplete  Resp Panel by RT-PCR (Flu A&B, Covid) Nasopharyngeal Swab     Status: None   Collection Time: 06/03/21  1:14 AM   Specimen: Nasopharyngeal Swab; Nasopharyngeal(NP) swabs in vial transport  medium  Result Value Ref Range Status   SARS Coronavirus 2 by RT PCR NEGATIVE NEGATIVE Final    Comment: (NOTE) SARS-CoV-2 target nucleic acids are NOT DETECTED.  The SARS-CoV-2 RNA is generally detectable in upper respiratory specimens during the acute phase of infection. The lowest concentration of SARS-CoV-2 viral copies this assay can detect is 138 copies/mL. A negative result does not preclude SARS-Cov-2 infection and should not be used as the sole basis for treatment or other patient management decisions. A negative result may occur with  improper specimen collection/handling, submission of specimen other than nasopharyngeal swab, presence of viral mutation(s) within the areas targeted by this assay, and inadequate number of viral copies(<138 copies/mL). A negative result must be combined with clinical observations, patient history, and epidemiological information. The expected result is Negative.  Fact Sheet for Patients:  EntrepreneurPulse.com.au  Fact Sheet for Healthcare Providers:  IncredibleEmployment.be  This test is no t yet approved or cleared by the [place] FDA and  has been authorized for detection and/or diagnosis of SARS-CoV-2 by FDA under an Emergency Use Authorization (EUA). This EUA will remain  in effect (meaning this test can be used) for the duration of the COVID-19 declaration under Section 564(b)(1) of the Act, 21 U.S.C.section 360bbb-3(b)(1), unless the authorization is terminated  or revoked sooner.       Influenza A by PCR NEGATIVE NEGATIVE Final   Influenza B by PCR NEGATIVE NEGATIVE Final    Comment: (NOTE) The Xpert Xpress SARS-CoV-2/FLU/RSV plus assay is intended as an aid in the diagnosis of influenza from Nasopharyngeal swab specimens and should not be used as a sole basis for treatment. Nasal washings and aspirates are unacceptable for Xpert Xpress SARS-CoV-2/FLU/RSV testing.  Fact Sheet for  Patients: EntrepreneurPulse.com.au  Fact Sheet for Healthcare Providers: IncredibleEmployment.be  This test is not yet approved or cleared by the [place] FDA  and has been authorized for detection and/or diagnosis of SARS-CoV-2 by FDA under an Emergency Use Authorization (EUA). This EUA will remain in effect (meaning this test can be used) for the duration of the COVID-19 declaration under Section 564(b)(1) of the Act, 21 U.S.C. section 360bbb-3(b)(1), unless the authorization is terminated or revoked.  Performed at [facility], [place] [address], [place], [place] [address]       Radiology Studies: No results found.   Marzetta Board, MD, PhD [facility]  Between 7 am - 7 pm I am available, please contact me via Amion (for emergencies) or Securechat (non urgent messages)  Between 7 pm - 7 am I am not available, please contact night coverage MD/APP via Amion

## 2021-06-05 NOTE — Progress Notes (Signed)
[facility] for warfarin Indication: atrial fibrillation  Labs: Recent Labs    06/03/21 0326 06/04/21 0049 06/05/21 0031  HGB 12.0 11.4* 11.6*  HCT 36.8 34.7* 35.6*  PLT 333 331 343  LABPROT 31.3* 36.7* 40.4*  INR 3.0* 3.7* 4.2*  CREATININE 0.74 0.73 0.75     Assessment: 56 yom with hx of afib on warfarin presenting with worsening abdominal wall cellulitis, failed outpatient therapy with clindamycin. Pharmacy consulted to dose warfarin inpatient. INR on admission is at the top of therapeutic range at 3.0. CBC WNL. No active bleed issues reported.  PTA warfarin dose - 3.75mg  daily except 7.5mg  on MWF (last dose 1/19 per med rec)  1/22: INR 4.2, Hgb 11.6, Plt 343. No signs or symptoms of bleeding reported. CBC WNL and stable  Goal of Therapy:  INR 2-3 Monitor platelets by anticoagulation protocol: Yes   Plan: Hold coumadin dose today- INR trending up on PTA dose; can resume on Monday at lower dose  Monitor INR trend closely Monitor daily INR, CBC, s/sx bleeding   Lestine Box, PharmD PGY2 Infectious Diseases Pharmacy Resident   Please check [email or web address] for unit-specific pharmacy phone numbers

## 2021-06-05 NOTE — TOC Progression Note (Signed)
Transition of Care [facility]) - Progression Note    Patient Details  Name: Donna Snow MRN: [id number] Date of Birth: 10-05-1950  Transition of Care [facility]) CM/SW Contact  Leone Haven, RN Phone Number: 06/05/2021, 2:25 PM  Clinical Narrative:     Transition of Care [facility]) Screening Note   Patient Details  Name: Donna Snow Date of Birth: Jun 06, 1950   Transition of Care [facility]) CM/SW Contact:    Leone Haven, RN Phone Number: 06/05/2021, 2:25 PM    Transition of Care Department [facility]) has reviewed patient and no TOC needs have been identified at this time. We will continue to monitor patient advancement through interdisciplinary progression rounds. If new patient transition needs arise, please place a TOC consult.          Expected Discharge Plan and Services                                                 Social Determinants of Health (SDOH) Interventions    Readmission Risk Interventions No flowsheet data found.

## 2021-06-05 NOTE — Plan of Care (Signed)
  Problem: Nutrition: Goal: Adequate nutrition will be maintained Outcome: Progressing   Problem: Pain Managment: Goal: General experience of comfort will improve Outcome: Progressing   Problem: Safety: Goal: Ability to remain free from injury will improve Outcome: Progressing   

## 2021-06-06 LAB — GLUCOSE, CAPILLARY
Glucose-Capillary: 279 mg/dL — ABNORMAL HIGH (ref 70–99)
Glucose-Capillary: 277 mg/dL — ABNORMAL HIGH (ref 70–99)
Glucose-Capillary: 299 mg/dL — ABNORMAL HIGH (ref 70–99)
Glucose-Capillary: 260 mg/dL — ABNORMAL HIGH (ref 70–99)

## 2021-06-06 LAB — CBC
HCT: 39.8 % (ref 36.0–46.0)
Hemoglobin: 13.4 g/dL (ref 12.0–15.0)
MCH: 29.1 pg (ref 26.0–34.0)
MCHC: 33.7 g/dL (ref 30.0–36.0)
MCV: 86.3 fL (ref 80.0–100.0)
Platelets: 364 10*3/uL (ref 150–400)
RBC: 4.61 MIL/uL (ref 3.87–5.11)
RDW: 13.6 % (ref 11.5–15.5)
WBC: 7.7 10*3/uL (ref 4.0–10.5)
nRBC: 0 % (ref 0.0–0.2)

## 2021-06-06 LAB — PROTIME-INR
INR: 2.9 — ABNORMAL HIGH (ref 0.8–1.2)
Prothrombin Time: 29.9 seconds — ABNORMAL HIGH (ref 11.4–15.2)

## 2021-06-06 MED ORDER — WARFARIN SODIUM 2.5 MG PO TABS
2.5000 mg | ORAL_TABLET | Freq: Once | ORAL | Status: AC
  Administered 2021-06-06: 2.5 mg via ORAL
  Filled 2021-06-06: qty 1

## 2021-06-06 MED ORDER — CARBAMIDE PEROXIDE 6.5 % OT SOLN
5.0000 [drp] | Freq: Two times a day (BID) | OTIC | Status: DC
  Administered 2021-06-06 – 2021-06-07 (×2): 5 [drp] via OTIC
  Filled 2021-06-06: qty 15

## 2021-06-06 NOTE — Progress Notes (Signed)
Pharmacy Antibiotic Note  Donna Snow is a 71 y.o. female admitted on 06/02/2021 with cellulitis.  Pharmacy has been consulted for vancomycin dosing. SCr stable at 0.74. Patient received vancomycin 1g IV x 1 dose 1/20 at 0035.  SCr remains stable.  Culture pending - hopefully can narrow once available  Plan: Continue vancomycin 1750mg  IV q24h. Goal AUC 400-550. Expected AUC: 487 SCr used: 0.8 Monitor clinical progress, c/s, renal function F/u de-escalation plan/LOT, vancomycin levels as indicated   Height: 5\' 3"  (160 cm) Weight: (!) 145.7 kg (321 lb 3.2 oz) IBW/kg (Calculated) : 52.4  Temp (24hrs), Avg:98 F (36.7 C), Min:97.5 F (36.4 C), Max:98.7 F (37.1 C)  Recent Labs  Lab 06/02/21 1642 06/02/21 1643 06/03/21 0326 06/04/21 0049 06/05/21 0031 06/06/21 0308  WBC 9.9  --  9.4 8.5 7.6 7.7  CREATININE 0.85  --  0.74 0.73 0.75  --   LATICACIDVEN  --  1.6 1.0  --   --   --     Estimated Creatinine Clearance: 94 mL/min (by C-G formula based on SCr of 0.75 mg/dL).    Allergies  Allergen Reactions   Latex Other (See Comments) and Hives   Penicillins     Tolerated ceftriaxone 10/2020    06/08/21, PharmD, BCPS, BCCCP Clinical Pharmacist Please refer to [facility] for [facility] Pharmacy numbers 06/06/2021 1:46 PM

## 2021-06-06 NOTE — Progress Notes (Signed)
[facility] for warfarin Indication: atrial fibrillation  Labs: Recent Labs    06/04/21 0049 06/05/21 0031 06/06/21 0308  HGB 11.4* 11.6* 13.4  HCT 34.7* 35.6* 39.8  PLT 331 343 364  LABPROT 36.7* 40.4* 29.9*  INR 3.7* 4.2* 2.9*  CREATININE 0.73 0.75  --     Assessment: 22 yom with hx of afib on warfarin presenting with worsening abdominal wall cellulitis, failed outpatient therapy with clindamycin. Pharmacy consulted to dose warfarin inpatient. INR on admission is at the top of therapeutic range at 3.0. CBC WNL. No active bleed issues reported.  PTA warfarin dose - 3.75mg  daily except 7.5mg  on MWF (last dose 1/19 per med rec)  1/23: INR trended back down to 2.9 today after holding one dose. Remains on Phenytoin at home dose. Patient is on vancomycin but no new significant interacting medications. Dietary intake may be playing a role. INR has been trending up on home dose so will give dose reduction and re-evaluate INR trends.   Goal of Therapy:  INR 2-3 Monitor platelets by anticoagulation protocol: Yes   Plan: Restart Warfarin 2.5 mg po x1 tonight. Daily PT/INR - trend closely  Monitor daily INR, CBC, s/sx bleeding   Sloan Leiter, PharmD, BCPS, BCCCP Clinical Pharmacist Please refer to [facility] for [facility] numbers 06/06/2021, 9:44 AM

## 2021-06-06 NOTE — Progress Notes (Signed)
PROGRESS NOTE  Donna Snow [id number] DOB: 1951-01-08 DOA: 06/02/2021 PCP: [employer]   LOS: 2 days   Brief Narrative / Interim history: 71 yo WF with hx of morbid obesity, PAF, HTN, DM2, anxiety, presents to ER with 1 week hx of worsening cellulitis of abdominal wall.  Patient states that she has been using hot compresses on the area of erythema on her abdominal wall.  She had spontaneous drainage about 2 to 3 days ago.  She was seen in urgent care and given a prescription for clindamycin which she has been taking for the last several days.  She states that the drainage has been bloody and sometimes white.  Has a bad smell to it.  She has had multiple abscesses on her belly in the past.  Subjective / 24h Interval events: She is a bit upset, she lost power at her home.  Abdominal wound with some pain but improved  Assessment & Plan: * Abdominal wall abscess- (present on admission) - Wound improving, continue local care, continue IV antibiotics for another day.  Cultures with gram-positive cocci and gram-positive rods, growing strep, sensitivities still pending.  She failed outpatient antibiotics and it is important to have targeted data for her antibiotics at the time of discharge.  AF (paroxysmal atrial fibrillation) ([name])- (present on admission) -Stable, on Coumadin, managed per pharmacy.  INR 2.9 this morning  Essential hypertension- (present on admission) -Stable  Nonintractable epilepsy without status epilepticus ([name]) -Continue dilantin  OSA (obstructive sleep apnea)- (present on admission) -Stable.  Anxiety state- (present on admission) -Continue home meds.  Morbid obesity with BMI of 50.0-59.9, adult (HCC) -chronic.Patient would benefit from weight loss  Chronic anticoagulation - on coumadin for afib -On Coumadin  GERD (gastroesophageal reflux disease)- (present on admission) -Stable.    Scheduled Meds:  busPIRone  20 mg Oral TID   DULoxetine   60 mg Oral BID   ezetimibe  10 mg Oral Daily   fluticasone furoate-vilanterol  1 puff Inhalation Daily   hydrOXYzine  25 mg Oral TID   insulin aspart  0-20 Units Subcutaneous TID WC   insulin aspart  0-5 Units Subcutaneous QHS   nystatin cream   Topical BID   pantoprazole  40 mg Oral Daily   phenytoin  400 mg Oral Daily   polyethylene glycol  17 g Oral Daily   rosuvastatin  40 mg Oral Daily   tiZANidine  8 mg Oral QHS   topiramate  200 mg Oral BID   traZODone  150 mg Oral QHS   warfarin  2.5 mg Oral ONCE-1600   Warfarin - Pharmacist Dosing Inpatient   Does not apply q1600   Continuous Infusions:  vancomycin 1,750 mg (06/05/21 1011)   PRN Meds:.acetaminophen **OR** acetaminophen, benzonatate, melatonin, ondansetron **OR** ondansetron (ZOFRAN) IV, oxyCODONE  Diet Orders (From admission, onward)     Start     Ordered   06/03/21 0216  Diet heart healthy/carb modified Room service appropriate? Yes; Fluid consistency: Thin  Diet effective now       Question Answer Comment  Diet-HS Snack? Nothing   Room service appropriate? Yes   Fluid consistency: Thin      06/03/21 0215            DVT prophylaxis: SCDs Start: 06/03/21 0216 warfarin (COUMADIN) tablet 2.5 mg   Lab Results  Component Value Date   PLT 364 06/06/2021      Code Status: Full Code  Family Communication: No family at bedside  Status is: Inpatient  Remains inpatient appropriate because: IV antibiotics  Level of care: Med-Surg  Consultants:  None  Procedures:  I&D abdominal wall abscess  Microbiology  Culture shows gram-positive cocci, gram-positive rods  Antimicrobials: IV vancomycin 1/20   Objective: Vitals:   06/05/21 2327 06/06/21 0431 06/06/21 0633 06/06/21 0817  BP:   (!) 126/37 (!) 111/45  Pulse:   60 64  Resp: 18  18 19   Temp:   97.8 F (36.6 C) 97.8 F (36.6 C)  TempSrc:   Oral   SpO2:   95% 96%  Weight:  (!) 145.7 kg    Height:       No intake or output data in the 24  hours ending 06/06/21 1049  Wt Readings from Last 3 Encounters:  06/06/21 (!) 145.7 kg  10/21/20 (!) 149.4 kg  04/10/20 128.4 kg    Examination:  Constitutional: NAD Eyes: lids and conjunctivae normal, no scleral icterus ENMT: mmm Neck: normal, supple Respiratory: clear to auscultation bilaterally, no wheezing, no crackles. Normal respiratory effort.  Cardiovascular: Regular rate and rhythm, no murmurs / rubs / gallops. No LE edema. Abdomen: soft, no distention, no tenderness. Bowel sounds positive.  Skin: no new rashes.  Cellulitis improving Neurologic: no focal deficits, equal strength   Data Reviewed: I have independently reviewed following labs and imaging studies   CBC Recent Labs  Lab 06/02/21 1642 06/03/21 0326 06/04/21 0049 06/05/21 0031 06/06/21 0308  WBC 9.9 9.4 8.5 7.6 7.7  HGB 12.3 12.0 11.4* 11.6* 13.4  HCT 37.8 36.8 34.7* 35.6* 39.8  PLT 307 333 331 343 364  MCV 87.7 87.2 85.7 86.6 86.3  MCH 28.5 28.4 28.1 28.2 29.1  MCHC 32.5 32.6 32.9 32.6 33.7  RDW 13.7 13.7 13.5 13.4 13.6  LYMPHSABS 1.7 2.1  --   --   --   MONOABS 0.7 0.6  --   --   --   EOSABS 0.2 0.2  --   --   --   BASOSABS 0.0 0.0  --   --   --      Recent Labs  Lab 06/02/21 1642 06/02/21 1643 06/03/21 0326 06/04/21 0049 06/05/21 0031 06/06/21 0308  NA 142  --  138 136 138  --   K 4.0  --  3.5 3.6 3.9  --   CL 105  --  106 108 107  --   CO2 24  --  22 22 22   --   GLUCOSE 150*  --  180* 235* 286*  --   BUN 18  --  15 11 12   --   CREATININE 0.85  --  0.74 0.73 0.75  --   CALCIUM 9.4  --  8.4* 8.3* 8.1*  --   AST 14*  --  15  --   --   --   ALT 21  --  20  --   --   --   ALKPHOS 124  --  127*  --   --   --   BILITOT 0.2*  --  0.7  --   --   --   ALBUMIN 2.8*  --  2.7*  --   --   --   LATICACIDVEN  --  1.6 1.0  --   --   --   INR  --   --  3.0* 3.7* 4.2* 2.9*  HGBA1C  --   --  7.5*  --   --   --       ------------------------------------------------------------------------------------------------------------------  No results for input(s): CHOL, HDL, LDLCALC, TRIG, CHOLHDL, LDLDIRECT in the last 72 hours.  Lab Results  Component Value Date   HGBA1C 7.5 (H) 06/03/2021   ------------------------------------------------------------------------------------------------------------------ No results for input(s): TSH, T4TOTAL, T3FREE, THYROIDAB in the last 72 hours.  Invalid input(s): FREET3  Cardiac Enzymes No results for input(s): CKMB, TROPONINI, MYOGLOBIN in the last 168 hours.  Invalid input(s): CK ------------------------------------------------------------------------------------------------------------------    Component Value Date/Time   BNP 178.0 (H) 10/17/2020 0148    CBG: Recent Labs  Lab 06/05/21 0731 06/05/21 1207 06/05/21 1652 06/05/21 1946 06/06/21 0819  GLUCAP 320* 248* 234* 293* 279*     Recent Results (from the past 240 hour(s))  Blood culture (routine x 2)     Status: None (Preliminary result)   Collection Time: 06/02/21  4:43 PM   Specimen: BLOOD RIGHT FOREARM  Result Value Ref Range Status   Specimen Description BLOOD RIGHT FOREARM  Final   Special Requests   Final    BOTTLES DRAWN AEROBIC AND ANAEROBIC Blood Culture adequate volume   Culture   Final    NO GROWTH 4 DAYS Performed at [facility], [place] [address], [place], [place] [address]    Report Status PENDING  Incomplete  Aerobic/Anaerobic Culture w Gram Stain (surgical/deep wound)     Status: None (Preliminary result)   Collection Time: 06/03/21  1:12 AM   Specimen: Wound  Result Value Ref Range Status   Specimen Description WOUND  Final   Special Requests NONE  Final   Gram Stain   Final    RARE WBC PRESENT,BOTH PMN AND MONONUCLEAR RARE GRAM POSITIVE COCCI RARE GRAM POSITIVE RODS    Culture   Final    FEW STREPTOCOCCUS INTERMEDIUS CULTURE REINCUBATED FOR BETTER  GROWTH SUSCEPTIBILITIES TO FOLLOW Performed at [facility], [place] [address], [place], [place] [address]    Report Status PENDING  Incomplete  Resp Panel by RT-PCR (Flu A&B, Covid) Nasopharyngeal Swab     Status: None   Collection Time: 06/03/21  1:14 AM   Specimen: Nasopharyngeal Swab; Nasopharyngeal(NP) swabs in vial transport medium  Result Value Ref Range Status   SARS Coronavirus 2 by RT PCR NEGATIVE NEGATIVE Final    Comment: (NOTE) SARS-CoV-2 target nucleic acids are NOT DETECTED.  The SARS-CoV-2 RNA is generally detectable in upper respiratory specimens during the acute phase of infection. The lowest concentration of SARS-CoV-2 viral copies this assay can detect is 138 copies/mL. A negative result does not preclude SARS-Cov-2 infection and should not be used as the sole basis for treatment or other patient management decisions. A negative result may occur with  improper specimen collection/handling, submission of specimen other than nasopharyngeal swab, presence of viral mutation(s) within the areas targeted by this assay, and inadequate number of viral copies(<138 copies/mL). A negative result must be combined with clinical observations, patient history, and epidemiological information. The expected result is Negative.  Fact Sheet for Patients:  EntrepreneurPulse.com.au  Fact Sheet for Healthcare Providers:  IncredibleEmployment.be  This test is no t yet approved or cleared by the [place] FDA and  has been authorized for detection and/or diagnosis of SARS-CoV-2 by FDA under an Emergency Use Authorization (EUA). This EUA will remain  in effect (meaning this test can be used) for the duration of the COVID-19 declaration under Section 564(b)(1) of the Act, 21 U.S.C.section 360bbb-3(b)(1), unless the authorization is terminated  or revoked sooner.       Influenza A by PCR NEGATIVE NEGATIVE  Final   Influenza B by PCR  NEGATIVE NEGATIVE Final    Comment: (NOTE) The Xpert Xpress SARS-CoV-2/FLU/RSV plus assay is intended as an aid in the diagnosis of influenza from Nasopharyngeal swab specimens and should not be used as a sole basis for treatment. Nasal washings and aspirates are unacceptable for Xpert Xpress SARS-CoV-2/FLU/RSV testing.  Fact Sheet for Patients: EntrepreneurPulse.com.au  Fact Sheet for Healthcare Providers: IncredibleEmployment.be  This test is not yet approved or cleared by the [place] FDA and has been authorized for detection and/or diagnosis of SARS-CoV-2 by FDA under an Emergency Use Authorization (EUA). This EUA will remain in effect (meaning this test can be used) for the duration of the COVID-19 declaration under Section 564(b)(1) of the Act, 21 U.S.C. section 360bbb-3(b)(1), unless the authorization is terminated or revoked.  Performed at [facility], [place] [address] [place], [place] [address]       Radiology Studies: No results found.   Marzetta Board, MD, PhD [facility]  Between 7 am - 7 pm I am available, please contact me via Amion (for emergencies) or Securechat (non urgent messages)  Between 7 pm - 7 am I am not available, please contact night coverage MD/APP via Amion

## 2021-06-06 NOTE — Progress Notes (Signed)
Inpatient Diabetes Program Recommendations  AACE/ADA: New Consensus Statement on Inpatient Glycemic Control (2015)  Target Ranges:  Prepandial:   less than 140 mg/dL      Peak postprandial:   less than 180 mg/dL (1-2 hours)      Critically ill patients:  140 - 180 mg/dL   Lab Results  Component Value Date   GLUCAP 279 (H) 06/06/2021   HGBA1C 7.5 (H) 06/03/2021    Review of Glycemic Control  Latest Reference Range & Units 06/05/21 12:07 06/05/21 16:52 06/05/21 19:46 06/06/21 08:19  Glucose-Capillary 70 - 99 mg/dL 694 (H) 503 (H) 888 (H) 279 (H)  (H): Data is abnormally high Diabetes history: Type 2 DM Outpatient Diabetes medications: U-500 12-20 units TID, Ozempic 0.25 mg qwk Current orders for Inpatient glycemic control: Novolog 0-20 units TID & HS  Inpatient Diabetes Program Recommendations:    Consider adding Semglee 24 units QHS.   Thanks, Lujean Rave, MSN, RNC-OB Diabetes Coordinator [phone] (8a-5p)<

## 2021-06-06 NOTE — Progress Notes (Signed)
Pt stated she doesn't want to wear CPAP for tonight.

## 2021-06-07 DIAGNOSIS — I5033 Acute on chronic diastolic (congestive) heart failure: Secondary | ICD-10-CM

## 2021-06-07 DIAGNOSIS — I5032 Chronic diastolic (congestive) heart failure: Secondary | ICD-10-CM

## 2021-06-07 DIAGNOSIS — I480 Paroxysmal atrial fibrillation: Secondary | ICD-10-CM | POA: Diagnosis not present

## 2021-06-07 DIAGNOSIS — L02211 Cutaneous abscess of abdominal wall: Secondary | ICD-10-CM | POA: Diagnosis not present

## 2021-06-07 LAB — CBC
HCT: 36.6 % (ref 36.0–46.0)
Hemoglobin: 12.2 g/dL (ref 12.0–15.0)
MCH: 28.6 pg (ref 26.0–34.0)
MCHC: 33.3 g/dL (ref 30.0–36.0)
MCV: 85.9 fL (ref 80.0–100.0)
Platelets: 349 10*3/uL (ref 150–400)
RBC: 4.26 MIL/uL (ref 3.87–5.11)
RDW: 13.4 % (ref 11.5–15.5)
WBC: 8.2 10*3/uL (ref 4.0–10.5)
nRBC: 0 % (ref 0.0–0.2)

## 2021-06-07 LAB — PROTIME-INR
INR: 1.9 — ABNORMAL HIGH (ref 0.8–1.2)
Prothrombin Time: 21.9 seconds — ABNORMAL HIGH (ref 11.4–15.2)

## 2021-06-07 LAB — CULTURE, BLOOD (ROUTINE X 2)
Culture: NO GROWTH
Special Requests: ADEQUATE

## 2021-06-07 LAB — GLUCOSE, CAPILLARY
Glucose-Capillary: 282 mg/dL — ABNORMAL HIGH (ref 70–99)
Glucose-Capillary: 320 mg/dL — ABNORMAL HIGH (ref 70–99)

## 2021-06-07 MED ORDER — WARFARIN SODIUM 5 MG PO TABS
5.0000 mg | ORAL_TABLET | Freq: Once | ORAL | Status: AC
Start: 2021-06-07 — End: 2021-06-07
  Administered 2021-06-07: 15:00:00 5 mg via ORAL
  Filled 2021-06-07: qty 1

## 2021-06-07 MED ORDER — CEFADROXIL 500 MG PO CAPS: 1000.0000 mg | ORAL_CAPSULE | Freq: Two times a day (BID) | ORAL | 0 refills | Status: AC

## 2021-06-07 NOTE — Care Management Important Message (Signed)
Important Message  Patient Details  Name: Gay Moncivais MRN: [id number] Date of Birth: [birth year]   Medicare Important Message Given:  Yes     Sherilyn Banker 06/07/2021, 12:37 PM

## 2021-06-07 NOTE — Progress Notes (Addendum)
Pt escorted by discharge RN off unit to DC lounge via WC. All pt belongings at side. Pt remains awake, alert and stable at baseline.

## 2021-06-07 NOTE — TOC Progression Note (Addendum)
Transition of Care [facility]) - Progression Note    Patient Details  Name: Jaidan Stachnik MRN: [id number] Date of Birth: 16-Sep-1950  [facility] CM/SW Contact  Huston Foley Jacklynn Ganong, RN Phone Number: 06/07/2021, 9:38 AM  Clinical Narrative:   Case Manager spoke with patient concerning having a HHRN come to assist with wound care. CM explained to patient that it will not be daily visits and she will have to be able to do dressing changes herself and have a support person. Patient voiced understanding . Discussed options for Home Health agencies, patient states she has used [facility] in the past and wants to do so now. CM called referral to Lorenza Chick, Liaison  With [facility]. No further needs identified. Messaged MD for Face to face and order for [facility].   Expected Discharge Plan: Home w Home Health Services Barriers to Discharge: No Barriers Identified  Expected Discharge Plan and Services Expected Discharge Plan: Home w Home Health Services   Discharge Planning Services: CM Consult Post Acute Care Choice: Home Health Living arrangements for the past 2 months: Single Family Home                 DME Arranged: N/A         HH Arranged: RN HH Agency: [facility] Date [facility] Agency Contacted: 06/07/21 Time HH Agency Contacted: [phone] Representative spoke with at [facility] Agency: Kandee Keen   Social Determinants of Health (SDOH) Interventions    Readmission Risk Interventions No flowsheet data found.

## 2021-06-07 NOTE — Discharge Summary (Signed)
Physician Discharge Summary  [place] WUJ:[id number] DOB: 05/19/1950 DOA: 06/02/2021  PCP: [facility]  Admit date: 06/02/2021 Discharge date: 06/07/2021  Admitted From: home Disposition:  home  Recommendations for Outpatient Follow-up:  Follow up with PCP in 1-2 weeks Please obtain BMP/CBC in one week  Home Health: none Equipment/Devices: none  Discharge Condition: stable CODE STATUS: Full code Diet recommendation: diabetic  HPI: Per admitting MD, 71 yo WF with hx of morbid obesity, PAF, HTN, DM2, anxiety, presents to ER with 1 week hx of worsening cellulitis of abdominal wall.  Patient states that she has been using hot compresses on the area of erythema on her abdominal wall.  She had spontaneous drainage about 2 to 3 days ago.  She was seen in urgent care and given a prescription for clindamycin which she has been taking for the last several days.  She states that the drainage has been bloody and sometimes white.  Has a bad smell to it.  She has had multiple abscesses on her belly in the past.  Hospital Course / Discharge diagnoses: * Abdominal wall abscess- (present on admission) -Patient was admitted to the hospital, in the ED she underwent I&D, and was placed on vancomycin.  Cultures from the I&D grew GPC and GPR, showing Streptococcus intermedius and Streptococcus group F.  Case was briefly discussed with Dr. Drue Second with ID over the phone.  Clinically has improved, will be transition to cefadroxil and discharged home in stable condition.  She has penicillin allergy however has tolerated cephalosporins in the past.  AF (paroxysmal atrial fibrillation) (HCC)- (present on admission) -Continue Coumadin for anticoagulation  Essential hypertension- (present on admission) -Stable  Chronic diastolic CHF (congestive heart failure) (HCC) -Stable, continue home Lasix.  Euvolemic  Nonintractable epilepsy without status epilepticus (HCC) -Continue dilantin  OSA  (obstructive sleep apnea)- (present on admission) -Stable.  Anxiety state- (present on admission) -Continue home meds.  Morbid obesity with BMI of 50.0-59.9, adult (HCC) -chronic.Patient would benefit from weight loss  Chronic anticoagulation - on coumadin for afib -On Coumadin  Uncontrolled type 2 diabetes mellitus with hyperglycemia, with long-term current use of insulin (HCC) -Continue home medications on discharge.  Most recent A1c 7.5  GERD (gastroesophageal reflux disease)- (present on admission) -Stable.    Sepsis ruled out   Discharge Instructions   Allergies as of 06/07/2021       Reactions   Latex Other (See Comments), Hives   Penicillins    Tolerated ceftriaxone 10/2020        Medication List     STOP taking these medications    clindamycin 300 MG capsule Commonly known as: CLEOCIN       TAKE these medications    albuterol 108 (90 Base) MCG/ACT inhaler Commonly known as: VENTOLIN HFA Inhale 2 puffs into the lungs every 4 (four) hours as needed for shortness of breath.   budesonide-formoterol 80-4.5 MCG/ACT inhaler Commonly known as: SYMBICORT Inhale 2 puffs into the lungs 2 (two) times daily.   busPIRone 10 MG tablet Commonly known as: BUSPAR Take 20 mg by mouth 3 (three) times daily.   cefadroxil 500 MG capsule Commonly known as: DURICEF Take 2 capsules (1,000 mg total) by mouth 2 (two) times daily for 7 days.   cyclobenzaprine 10 MG tablet Commonly known as: FLEXERIL Take 1 tablet (10 mg total) by mouth 2 (two) times daily as needed for muscle spasms.   DULoxetine 60 MG capsule Commonly known as: CYMBALTA Take 60 mg by  mouth 2 (two) times daily.   ezetimibe 10 MG tablet Commonly known as: ZETIA Take 1 tablet by mouth daily.   fluticasone furoate-vilanterol 100-25 MCG/INH Aepb Commonly known as: BREO ELLIPTA Inhale 1 puff into the lungs daily.   furosemide 40 MG tablet Commonly known as: LASIX Take 40 mg by mouth daily.    hydrOXYzine 25 MG tablet Commonly known as: ATARAX Take 25 mg by mouth 3 (three) times daily.   Hyoscyamine Sulfate SL 0.125 MG Subl Place 1 tablet under the tongue 3 (three) times daily as needed (stomach cramps).   IBU 800 MG tablet Generic drug: ibuprofen Take 1 tablet (800 mg total) by mouth every 8 (eight) hours as needed for moderate pain.   insulin regular human CONCENTRATED 500 UNIT/ML injection Commonly known as: HUMULIN R Inject 12-20 Units into the skin See admin instructions. Sliding scale 3 times daily   loratadine 10 MG tablet Commonly known as: CLARITIN Take 10 mg by mouth daily.   losartan 50 MG tablet Commonly known as: COZAAR Take 1 tablet by mouth daily.   omeprazole 40 MG capsule Commonly known as: PRILOSEC Take 40 mg by mouth daily.   phenytoin 100 MG ER capsule Commonly known as: DILANTIN Take 400 mg by mouth daily.   rosuvastatin 40 MG tablet Commonly known as: CRESTOR Take 1 tablet by mouth daily.   Semaglutide(0.25 or 0.5MG /DOS) 2 MG/1.5ML Sopn Inject 0.25 mg into the skin every Friday.   tiZANidine 4 MG tablet Commonly known as: ZANAFLEX Take 8 mg by mouth at bedtime.   topiramate 100 MG tablet Commonly known as: TOPAMAX Take 200 mg by mouth 2 (two) times daily.   traZODone 150 MG tablet Commonly known as: DESYREL Take 150 mg by mouth at bedtime.   warfarin 7.5 MG tablet Commonly known as: Coumadin Take 1 tablet (7.5 mg total) by mouth daily at 4 PM. What changed:  how much to take when to take this additional instructions        Follow-up Information     [facility], [facility] Follow up.   Specialty: Home Health Services Why: Someone from [facility] will contact you to arrange start date and time for your [facility]. Contact information: [address] [place] [address] [phone]                 Consultations: none  Procedures/Studies:  CT ABDOMEN PELVIS W CONTRAST  Result Date:  06/03/2021 CLINICAL DATA:  Abdominal wall abscess. EXAM: CT ABDOMEN AND PELVIS WITH CONTRAST TECHNIQUE: Multidetector CT imaging of the abdomen and pelvis was performed using the standard protocol following bolus administration of intravenous contrast. RADIATION DOSE REDUCTION: This exam was performed according to the departmental dose-optimization program which includes automated exposure control, adjustment of the mA and/or kV according to patient size and/or use of iterative reconstruction technique. CONTRAST:  OMNIPAQUE IOHEXOL 350 MG/ML SOLN COMPARISON:  October 18, 2020 FINDINGS: Lower chest: No acute abnormality. Hepatobiliary: No focal liver abnormality is seen. Status post cholecystectomy. No biliary dilatation. Pancreas: Unremarkable. No pancreatic ductal dilatation or surrounding inflammatory changes. Spleen: Normal in size without focal abnormality. Adrenals/Urinary Tract: Adrenal glands are unremarkable. Kidneys are normal in size with areas of lobulated cortex seen within the lateral aspect of the upper pole of the right kidney. There is no evidence of renal calculi or hydronephrosis. Bladder is unremarkable. Stomach/Bowel: There is a small hiatal hernia. Appendix appears normal. No evidence of bowel wall thickening, distention, or inflammatory changes.  Vascular/Lymphatic: No significant vascular findings are present. No enlarged abdominal or pelvic lymph nodes. Reproductive: Status post hysterectomy. No adnexal masses. Other: Moderate severity subcutaneous inflammatory fat stranding is seen along the anterolateral abdominal wall on the right. Associated cutaneous thickening is seen. There is no evidence of an associated fluid collection or abscess. A 9.4 cm x 3.8 cm x 5.5 cm ventral hernia is seen along the anterior aspect of the lower pelvic wall, to the left of midline. This contains fat and nondilated small bowel loops. Mild to moderate severity adjacent para muscular inflammatory fat stranding  is noted. No abdominopelvic ascites. Musculoskeletal: Degenerative changes are seen at the levels of L4-L5 and L5-S1. IMPRESSION: 1. Moderate severity cellulitis involving the anterolateral abdominal wall on the right, without evidence of an associated fluid collection or abscess. 2. Lower anterior pelvic wall ventral hernia,, with adjacent areas of para muscular cellulitis, as described above. 3. Small hiatal hernia. 4. Evidence of prior cholecystectomy and hysterectomy. Electronically Signed   By: Aram Candela M.D.   On: 06/03/2021 02:22     Subjective: - no chest pain, shortness of breath, no abdominal pain, nausea or vomiting.   Discharge Exam: BP (!) 138/53 (BP Location: Right Arm)    Pulse 63    Temp 98.2 F (36.8 C) (Oral)    Resp 18    Ht 5\' 3"  (1.6 m)    Wt (!) 145.7 kg    SpO2 96%    BMI 56.90 kg/m   General: Pt is alert, awake, not in acute distress Cardiovascular: RRR, S1/S2 +, no rubs, no gallops Respiratory: CTA bilaterally, no wheezing, no rhonchi Abdominal: Soft, NT, ND, bowel sounds + Extremities: no edema, no cyanosis   The results of significant diagnostics from this hospitalization (including imaging, microbiology, ancillary and laboratory) are listed below for reference.     Microbiology: Recent Results (from the past 240 hour(s))  Blood culture (routine x 2)     Status: None   Collection Time: 06/02/21  4:43 PM   Specimen: BLOOD RIGHT FOREARM  Result Value Ref Range Status   Specimen Description BLOOD RIGHT FOREARM  Final   Special Requests   Final    BOTTLES DRAWN AEROBIC AND ANAEROBIC Blood Culture adequate volume   Culture   Final    NO GROWTH 5 DAYS Performed at [facility], [address] [place], [place]    Report Status 06/07/2021 FINAL  Final  Aerobic/Anaerobic Culture w Gram Stain (surgical/deep wound)     Status: None (Preliminary result)   Collection Time: 06/03/21  1:12 AM   Specimen: Wound  Result Value Ref Range Status    Specimen Description WOUND  Final   Special Requests NONE  Final   Gram Stain   Final    RARE WBC PRESENT,BOTH PMN AND MONONUCLEAR RARE GRAM POSITIVE COCCI RARE GRAM POSITIVE RODS    Culture   Final    FEW STREPTOCOCCUS INTERMEDIUS RARE STREPTOCOCCUS GROUP F Beta hemolytic streptococci are predictably susceptible to penicillin and other beta lactams. Susceptibility testing not routinely performed. repeating susceptibility Performed at [facility], [address] [place], [place]    Report Status PENDING  Incomplete  Resp Panel by RT-PCR (Flu A&B, Covid) Nasopharyngeal Swab     Status: None   Collection Time: 06/03/21  1:14 AM   Specimen: Nasopharyngeal Swab; Nasopharyngeal(NP) swabs in vial transport medium  Result Value Ref Range Status   SARS Coronavirus 2 by RT PCR  NEGATIVE NEGATIVE Final    Comment: (NOTE) SARS-CoV-2 target nucleic acids are NOT DETECTED.  The SARS-CoV-2 RNA is generally detectable in upper respiratory specimens during the acute phase of infection. The lowest concentration of SARS-CoV-2 viral copies this assay can detect is 138 copies/mL. A negative result does not preclude SARS-Cov-2 infection and should not be used as the sole basis for treatment or other patient management decisions. A negative result may occur with  improper specimen collection/handling, submission of specimen other than nasopharyngeal swab, presence of viral mutation(s) within the areas targeted by this assay, and inadequate number of viral copies(<138 copies/mL). A negative result must be combined with clinical observations, patient history, and epidemiological information. The expected result is Negative.  Fact Sheet for Patients:  BloggerCourse.comhttps://www.fda.gov/media/152166/download  Fact Sheet for Healthcare Providers:  SeriousBroker.ithttps://www.fda.gov/media/152162/download  This test is no t yet approved or cleared by the [place] FDA and  has been authorized for detection  and/or diagnosis of SARS-CoV-2 by FDA under an Emergency Use Authorization (EUA). This EUA will remain  in effect (meaning this test can be used) for the duration of the COVID-19 declaration under Section 564(b)(1) of the Act, 21 U.S.C.section 360bbb-3(b)(1), unless the authorization is terminated  or revoked sooner.       Influenza A by PCR NEGATIVE NEGATIVE Final   Influenza B by PCR NEGATIVE NEGATIVE Final    Comment: (NOTE) The Xpert Xpress SARS-CoV-2/FLU/RSV plus assay is intended as an aid in the diagnosis of influenza from Nasopharyngeal swab specimens and should not be used as a sole basis for treatment. Nasal washings and aspirates are unacceptable for Xpert Xpress SARS-CoV-2/FLU/RSV testing.  Fact Sheet for Patients: BloggerCourse.comhttps://www.fda.gov/media/152166/download  Fact Sheet for Healthcare Providers: SeriousBroker.ithttps://www.fda.gov/media/152162/download  This test is not yet approved or cleared by the [place] FDA and has been authorized for detection and/or diagnosis of SARS-CoV-2 by FDA under an Emergency Use Authorization (EUA). This EUA will remain in effect (meaning this test can be used) for the duration of the COVID-19 declaration under Section 564(b)(1) of the Act, 21 U.S.C. section 360bbb-3(b)(1), unless the authorization is terminated or revoked.  Performed at [facility], [address] [place], [place] [id number]      Labs: Basic Metabolic Panel: Recent Labs  Lab 06/02/21 1642 06/03/21 0326 06/04/21 0049 06/05/21 0031  NA 142 138 136 138  K 4.0 3.5 3.6 3.9  CL 105 106 108 107  CO2 24 22 22 22   GLUCOSE 150* 180* 235* 286*  BUN 18 15 11 12   CREATININE 0.85 0.74 0.73 0.75  CALCIUM 9.4 8.4* 8.3* 8.1*   Liver Function Tests: Recent Labs  Lab 06/02/21 1642 06/03/21 0326  AST 14* 15  ALT 21 20  ALKPHOS 124 127*  BILITOT 0.2* 0.7  PROT 7.2 6.8  ALBUMIN 2.8* 2.7*   CBC: Recent Labs  Lab 06/02/21 1642 06/03/21 0326 06/04/21 0049  06/05/21 0031 06/06/21 0308 06/07/21 0158  WBC 9.9 9.4 8.5 7.6 7.7 8.2  NEUTROABS 7.2 6.5  --   --   --   --   HGB 12.3 12.0 11.4* 11.6* 13.4 12.2  HCT 37.8 36.8 34.7* 35.6* 39.8 36.6  MCV 87.7 87.2 85.7 86.6 86.3 85.9  PLT 307 333 331 343 364 349   CBG: Recent Labs  Lab 06/06/21 1226 06/06/21 1641 06/06/21 2144 06/07/21 0733 06/07/21 1055  GLUCAP 277* 299* 260* 282* 320*   Hgb A1c No results for input(s): HGBA1C in the last 72 hours. Lipid Profile No results  for input(s): CHOL, HDL, LDLCALC, TRIG, CHOLHDL, LDLDIRECT in the last 72 hours. Thyroid function studies No results for input(s): TSH, T4TOTAL, T3FREE, THYROIDAB in the last 72 hours.  Invalid input(s): FREET3 Urinalysis    Component Value Date/Time   COLORURINE YELLOW 10/15/2020 1541   APPEARANCEUR CLEAR 10/15/2020 1541   LABSPEC 1.016 10/15/2020 1541   PHURINE 7.0 10/15/2020 1541   GLUCOSEU >=500 (A) 10/15/2020 1541   HGBUR NEGATIVE 10/15/2020 1541   BILIRUBINUR NEGATIVE 10/15/2020 1541   KETONESUR 20 (A) 10/15/2020 1541   PROTEINUR NEGATIVE 10/15/2020 1541   UROBILINOGEN 1.0 11/21/2014 1800   NITRITE NEGATIVE 10/15/2020 1541   LEUKOCYTESUR NEGATIVE 10/15/2020 1541    FURTHER DISCHARGE INSTRUCTIONS:   Get Medicines reviewed and adjusted: Please take all your medications with you for your next visit with your Primary MD   Laboratory/radiological data: Please request your Primary MD to go over all hospital tests and procedure/radiological results at the follow up, please ask your Primary MD to get all Hospital records sent to his/her office.   In some cases, they will be blood work, cultures and biopsy results pending at the time of your discharge. Please request that your primary care M.D. goes through all the records of your hospital data and follows up on these results.   Also Note the following: If you experience worsening of your admission symptoms, develop shortness of breath, life threatening  emergency, suicidal or homicidal thoughts you must seek medical attention immediately by calling 911 or calling your MD immediately  if symptoms less severe.   You must read complete instructions/literature along with all the possible adverse reactions/side effects for all the Medicines you take and that have been prescribed to you. Take any new Medicines after you have completely understood and accpet all the possible adverse reactions/side effects.    Do not drive when taking Pain medications or sleeping medications (Benzodaizepines)   Do not take more than prescribed Pain, Sleep and Anxiety Medications. It is not advisable to combine anxiety,sleep and pain medications without talking with your primary care practitioner   Special Instructions: If you have smoked or chewed Tobacco  in the last 2 yrs please stop smoking, stop any regular Alcohol  and or any Recreational drug use.   Wear Seat belts while driving.   Please note: You were cared for by a hospitalist during your hospital stay. Once you are discharged, your primary care physician will handle any further medical issues. Please note that NO REFILLS for any discharge medications will be authorized once you are discharged, as it is imperative that you return to your primary care physician (or establish a relationship with a primary care physician if you do not have one) for your post hospital discharge needs so that they can reassess your need for medications and monitor your lab values.  Time coordinating discharge: 40 minutes  SIGNED:  Pamella Pertostin Nayali Talerico, MD, PhD 06/07/2021, 2:17 PM

## 2021-06-07 NOTE — Progress Notes (Signed)
ANTICOAGULATION CONSULT NOTE  Pharmacy Consult for warfarin Indication: atrial fibrillation  Labs: Recent Labs    06/05/21 0031 06/06/21 0308 06/07/21 0158  HGB 11.6* 13.4 12.2  HCT 35.6* 39.8 36.6  PLT 343 364 349  LABPROT 40.4* 29.9* 21.9*  INR 4.2* 2.9* 1.9*  CREATININE 0.75  --   --      Assessment: 70 yom with hx of afib on warfarin presenting with worsening abdominal wall cellulitis, failed outpatient therapy with clindamycin. Pharmacy consulted to dose warfarin inpatient. INR on admission is at the top of therapeutic range at 3.0. CBC WNL. No active bleed issues reported.  PTA warfarin dose - 3.75mg  daily except 7.5mg  on MWF (last dose 1/19 per med rec)  INR continues large downward trend back down to 1.9 (s/p dose held 1/22). Remains on Phenytoin at home dose. Dietary intake may be playing a role in variable INRs.   Goal of Therapy:  INR 2-3 Monitor platelets by anticoagulation protocol: Yes   Plan: Warfarin 5 mg po tonight. Daily PT/INR   Christoper Fabian, PharmD, BCPS Please see amion for complete clinical pharmacist phone list 06/07/2021, 7:33 AM

## 2021-06-07 NOTE — Discharge Instructions (Signed)
Information on my medicine - Coumadin   (Warfarin)  This medication education was reviewed with me or my healthcare representative as part of my discharge preparation.   Why was Coumadin prescribed for you? Coumadin was prescribed for you because you have a blood clot or a medical condition that can cause an increased risk of forming blood clots. Blood clots can cause serious health problems by blocking the flow of blood to the heart, lung, or brain. Coumadin can prevent harmful blood clots from forming. As a reminder your indication for Coumadin is:  Stroke Prevention because of Atrial Fibrillation  What test will check on my response to Coumadin? While on Coumadin (warfarin) you will need to have an INR test regularly to ensure that your dose is keeping you in the desired range. The INR (international normalized ratio) number is calculated from the result of the laboratory test called prothrombin time (PT).  If an INR APPOINTMENT HAS NOT ALREADY BEEN MADE FOR YOU please schedule an appointment to have this lab work done by your health care provider within 7 days. Your INR goal is usually a number between:  2 to 3 or your provider may give you a more narrow range like 2-2.5.  Ask your health care provider during an office visit what your goal INR is.  What  do you need to  know  About  COUMADIN? Take Coumadin (warfarin) exactly as prescribed by your healthcare provider about the same time each day.  DO NOT stop taking without talking to the doctor who prescribed the medication.  Stopping without other blood clot prevention medication to take the place of Coumadin may increase your risk of developing a new clot or stroke.  Get refills before you run out.  What do you do if you miss a dose? If you miss a dose, take it as soon as you remember on the same day then continue your regularly scheduled regimen the next day.  Do not take two doses of Coumadin at the same time.  Important Safety  Information A possible side effect of Coumadin (Warfarin) is an increased risk of bleeding. You should call your healthcare provider right away if you experience any of the following: Bleeding from an injury or your nose that does not stop. Unusual colored urine (red or dark brown) or unusual colored stools (red or black). Unusual bruising for unknown reasons. A serious fall or if you hit your head (even if there is no bleeding).  Some foods or medicines interact with Coumadin (warfarin) and might alter your response to warfarin. To help avoid this: Eat a balanced diet, maintaining a consistent amount of Vitamin K. Notify your provider about major diet changes you plan to make. Avoid alcohol or limit your intake to 1 drink for women and 2 drinks for men per day. (1 drink is 5 oz. wine, 12 oz. beer, or 1.5 oz. liquor.)  Make sure that ANY health care provider who prescribes medication for you knows that you are taking Coumadin (warfarin).  Also make sure the healthcare provider who is monitoring your Coumadin knows when you have started a new medication including herbals and non-prescription products.  Coumadin (Warfarin)  Major Drug Interactions  Increased Warfarin Effect Decreased Warfarin Effect  Alcohol (large quantities) Antibiotics (esp. Septra/Bactrim, Flagyl, Cipro) Amiodarone (Cordarone) Aspirin (ASA) Cimetidine (Tagamet) Megestrol (Megace) NSAIDs (ibuprofen, naproxen, etc.) Piroxicam (Feldene) Propafenone (Rythmol SR) Propranolol (Inderal) Isoniazid (INH) Posaconazole (Noxafil) Barbiturates (Phenobarbital) Carbamazepine (Tegretol) Chlordiazepoxide (Librium) Cholestyramine (Questran) Griseofulvin Oral Contraceptives   Rifampin Sucralfate (Carafate) Vitamin K   Coumadin (Warfarin) Major Herbal Interactions  Increased Warfarin Effect Decreased Warfarin Effect  Garlic Ginseng Ginkgo biloba Coenzyme Q10 Green tea St. John's wort    Coumadin (Warfarin) FOOD  Interactions  Eat a consistent number of servings per week of foods HIGH in Vitamin K (1 serving =  cup)  Collards (cooked, or boiled & drained) Kale (cooked, or boiled & drained) Mustard greens (cooked, or boiled & drained) Parsley *serving size only =  cup Spinach (cooked, or boiled & drained) Swiss chard (cooked, or boiled & drained) Turnip greens (cooked, or boiled & drained)  Eat a consistent number of servings per week of foods MEDIUM-HIGH in Vitamin K (1 serving = 1 cup)  Asparagus (cooked, or boiled & drained) Broccoli (cooked, boiled & drained, or raw & chopped) Brussel sprouts (cooked, or boiled & drained) *serving size only =  cup Lettuce, raw (green leaf, endive, romaine) Spinach, raw Turnip greens, raw & chopped   These websites have more information on Coumadin (warfarin):  www.coumadin.com; www.ahrq.gov/consumer/coumadin.htm;   

## 2021-06-07 NOTE — Assessment & Plan Note (Signed)
-  Stable, continue home Lasix.  Euvolemic

## 2021-06-07 NOTE — Progress Notes (Signed)
Discharge instructions given at this time. Pt states family will be here in 1 hr to pick her up. Reviewed wound care with pt and provided pt with supplies for dressing change tonight. Pt states [place] agency called her and is set up to come out tomorrow 1/25. Instructed pt to notify RN when family is here for DC. All pt belongings at bedside; pt remains stable at baseline.

## 2021-06-07 NOTE — Assessment & Plan Note (Signed)
-  Continue home medications on discharge.  Most recent A1c 7.5

## 2021-06-14 LAB — SUSCEPTIBILITY, AER + ANAEROB: Source of Sample: 8680

## 2021-06-14 LAB — SUSCEPTIBILITY RESULT

## 2021-06-21 LAB — AEROBIC/ANAEROBIC CULTURE W GRAM STAIN (SURGICAL/DEEP WOUND)

## 2022-02-13 ENCOUNTER — Emergency Department (HOSPITAL_COMMUNITY): Payer: Medicare HMO

## 2022-02-13 ENCOUNTER — Observation Stay (HOSPITAL_COMMUNITY)
Admission: EM | Admit: 2022-02-13 | Discharge: 2022-02-17 | Disposition: A | Discharge status: Home / Self Care | Payer: Medicare HMO | Attending: Internal Medicine | Admitting: Internal Medicine

## 2022-02-13 ENCOUNTER — Other Ambulatory Visit: Payer: Self-pay

## 2022-02-13 ENCOUNTER — Encounter (HOSPITAL_COMMUNITY): Payer: Self-pay

## 2022-02-13 DIAGNOSIS — Z7985 Long-term (current) use of injectable non-insulin antidiabetic drugs: Secondary | ICD-10-CM | POA: Diagnosis not present

## 2022-02-13 DIAGNOSIS — Z7984 Long term (current) use of oral hypoglycemic drugs: Secondary | ICD-10-CM | POA: Insufficient documentation

## 2022-02-13 DIAGNOSIS — I5032 Chronic diastolic (congestive) heart failure: Secondary | ICD-10-CM | POA: Diagnosis not present

## 2022-02-13 DIAGNOSIS — E785 Hyperlipidemia, unspecified: Secondary | ICD-10-CM | POA: Diagnosis present

## 2022-02-13 DIAGNOSIS — F411 Generalized anxiety disorder: Secondary | ICD-10-CM | POA: Diagnosis present

## 2022-02-13 DIAGNOSIS — R079 Chest pain, unspecified: Secondary | ICD-10-CM

## 2022-02-13 DIAGNOSIS — Z91040 Latex allergy status: Secondary | ICD-10-CM | POA: Diagnosis not present

## 2022-02-13 DIAGNOSIS — E876 Hypokalemia: Secondary | ICD-10-CM | POA: Diagnosis not present

## 2022-02-13 DIAGNOSIS — L03311 Cellulitis of abdominal wall: Secondary | ICD-10-CM | POA: Diagnosis present

## 2022-02-13 DIAGNOSIS — Z79899 Other long term (current) drug therapy: Secondary | ICD-10-CM | POA: Insufficient documentation

## 2022-02-13 DIAGNOSIS — L089 Local infection of the skin and subcutaneous tissue, unspecified: Secondary | ICD-10-CM | POA: Diagnosis present

## 2022-02-13 DIAGNOSIS — I10 Essential (primary) hypertension: Secondary | ICD-10-CM | POA: Diagnosis present

## 2022-02-13 DIAGNOSIS — R0600 Dyspnea, unspecified: Secondary | ICD-10-CM | POA: Insufficient documentation

## 2022-02-13 DIAGNOSIS — Z9660 Presence of unspecified orthopedic joint implant: Secondary | ICD-10-CM | POA: Insufficient documentation

## 2022-02-13 DIAGNOSIS — I480 Paroxysmal atrial fibrillation: Secondary | ICD-10-CM | POA: Diagnosis not present

## 2022-02-13 DIAGNOSIS — I110 Hypertensive heart disease with heart failure: Secondary | ICD-10-CM | POA: Diagnosis not present

## 2022-02-13 DIAGNOSIS — I5033 Acute on chronic diastolic (congestive) heart failure: Secondary | ICD-10-CM | POA: Diagnosis present

## 2022-02-13 DIAGNOSIS — Z7901 Long term (current) use of anticoagulants: Secondary | ICD-10-CM | POA: Insufficient documentation

## 2022-02-13 DIAGNOSIS — L02211 Cutaneous abscess of abdominal wall: Secondary | ICD-10-CM | POA: Diagnosis not present

## 2022-02-13 DIAGNOSIS — Z794 Long term (current) use of insulin: Secondary | ICD-10-CM | POA: Diagnosis not present

## 2022-02-13 DIAGNOSIS — E119 Type 2 diabetes mellitus without complications: Secondary | ICD-10-CM

## 2022-02-13 LAB — CBC WITH DIFFERENTIAL/PLATELET
Abs Immature Granulocytes: 0.03 10*3/uL (ref 0.00–0.07)
Basophils Absolute: 0 10*3/uL (ref 0.0–0.1)
Basophils Relative: 1 %
Eosinophils Absolute: 0.2 10*3/uL (ref 0.0–0.5)
Eosinophils Relative: 3 %
HCT: 40.3 % (ref 36.0–46.0)
Hemoglobin: 13.5 g/dL (ref 12.0–15.0)
Immature Granulocytes: 1 %
Lymphocytes Relative: 24 %
Lymphs Abs: 1.6 10*3/uL (ref 0.7–4.0)
MCH: 29 pg (ref 26.0–34.0)
MCHC: 33.5 g/dL (ref 30.0–36.0)
MCV: 86.5 fL (ref 80.0–100.0)
Monocytes Absolute: 0.5 10*3/uL (ref 0.1–1.0)
Monocytes Relative: 8 %
Neutro Abs: 4.2 10*3/uL (ref 1.7–7.7)
Neutrophils Relative %: 63 %
Platelets: 299 10*3/uL (ref 150–400)
RBC: 4.66 MIL/uL (ref 3.87–5.11)
RDW: 13.3 % (ref 11.5–15.5)
WBC: 6.6 10*3/uL (ref 4.0–10.5)
nRBC: 0 % (ref 0.0–0.2)

## 2022-02-13 LAB — BASIC METABOLIC PANEL
Anion gap: 8 (ref 5–15)
BUN: 8 mg/dL (ref 8–23)
CO2: 24 mmol/L (ref 22–32)
Calcium: 8.5 mg/dL — ABNORMAL LOW (ref 8.9–10.3)
Chloride: 106 mmol/L (ref 98–111)
Creatinine, Ser: 0.82 mg/dL (ref 0.44–1.00)
GFR, Estimated: >60 mL/min (ref >60)
Glucose, Bld: 125 mg/dL — ABNORMAL HIGH (ref 70–99)
Potassium: 3.4 mmol/L — ABNORMAL LOW (ref 3.5–5.1)
Sodium: 138 mmol/L (ref 135–145)

## 2022-02-13 LAB — PROTIME-INR
INR: 2.4 — ABNORMAL HIGH (ref 0.8–1.2)
Prothrombin Time: 25.9 seconds — ABNORMAL HIGH (ref 11.4–15.2)

## 2022-02-13 LAB — TROPONIN I (HIGH SENSITIVITY): Troponin I (High Sensitivity): 5 ng/L (ref <18)

## 2022-02-13 LAB — LACTIC ACID, PLASMA: Lactic Acid, Venous: 1.6 mmol/L (ref 0.5–1.9)

## 2022-02-13 MED ORDER — VANCOMYCIN HCL 1750 MG/350ML IV SOLN
1750.0000 mg | INTRAVENOUS | Status: DC
  Administered 2022-02-15 – 2022-02-16 (×3): 1750 mg via INTRAVENOUS
  Filled 2022-02-13 (×4): qty 350

## 2022-02-13 MED ORDER — IOHEXOL 350 MG/ML SOLN
100.0000 mL | Freq: Once | INTRAVENOUS | Status: AC | PRN
  Administered 2022-02-13: 100 mL via INTRAVENOUS

## 2022-02-13 MED ORDER — VANCOMYCIN HCL 2000 MG/400ML IV SOLN
2000.0000 mg | Freq: Once | INTRAVENOUS | Status: AC
  Administered 2022-02-13: 2000 mg via INTRAVENOUS
  Filled 2022-02-13: qty 400

## 2022-02-13 NOTE — ED Provider Triage Note (Signed)
Emergency Medicine Provider Triage Evaluation Note  Tilly Pernice , a 71 y.o. female  was evaluated in triage.  Pt complains of infection right lower quadrants, going on for last 2 weeks, states it happened after she injections help with her insulin, states area is becoming more red and more swollen and is has a foul smell to it, was seen in urgent care and sent here for possible staph infection..  Review of Systems  Positive: Abscess, swelling Negative: Fever, chills  Physical Exam  BP (!) 167/66 (BP Location: Right Arm)   Pulse 79   Temp 98.4 F (36.9 C) (Oral)   Resp 16   SpO2 94%  Gen:   Awake, no distress   Resp:  Normal effort  MSK:   Moves extremities without difficulty  Other:    Medical Decision Making  Medically screening exam initiated at 11:25 AM.  Appropriate orders placed.  Caroleen Hamman was informed that the remainder of the evaluation will be completed by another provider, this initial triage assessment does not replace that evaluation, and the importance of remaining in the ED until their evaluation is complete.  Lab work has been ordered will need further work-up.   Marcello Fennel, PA-C 02/13/22 1126

## 2022-02-13 NOTE — ED Provider Notes (Signed)
[facility] EMERGENCY DEPARTMENT Provider Note   CSN: [id number] Arrival date & time: 02/13/22  1043     History  Chief Complaint  Patient presents with   Abdominal Injury    Donna Snow is a 71 y.o. female.  HPI    71yo female with history of CHF, DM, hlpd, htn, seizures. MRSA, atrial fibrillation on coumadin, previous admission in June 2022, January 2023 for abscess , who presents with concern for abdominal wall abscess.   Was seen in urgent care on the 28th and was recommended to come to the ED Reports she gives herself insulin in this location, gave herself shot a few weeks ago and felt like it hurt in a different way, after this it became red, she did warm compresses and was monitoring it, however over the last 5 days it has significantly worsened, on Friday it busted open and began draining. Reports that copious amounts of pus are coming from the wound   Has had chills, nausea. No known fevers.  Does have abdominal pain surrounding the area. No current diarrhea/constipation  Past Medical History:  Diagnosis Date   Anxiety    CHF (congestive heart failure) (HCC)    Diabetes mellitus    Hypercholesteremia    Hypertension    Obesity    Seizures ([place])     Home Medications Prior to Admission medications   Medication Sig Start Date End Date Taking? Authorizing Provider  albuterol (VENTOLIN HFA) 108 (90 Base) MCG/ACT inhaler Inhale 2 puffs into the lungs every 4 (four) hours as needed for shortness of breath. 06/06/20  Yes [provider]  busPIRone (BUSPAR) 10 MG tablet Take 20 mg by mouth 3 (three) times daily. 08/16/20  Yes [provider]  ezetimibe (ZETIA) 10 MG tablet Take 1 tablet by mouth daily. 07/25/18  Yes [provider]  FLUoxetine (PROZAC) 20 MG capsule Take 20 mg by mouth daily. Take in addition to the 40 mg capsule for a total of 60 mg daily. 01/03/22  Yes [provider]  FLUoxetine (PROZAC) 40 MG capsule  Take 40 mg by mouth daily. Take in addition to the 20 mg capsule for a total of 60 mg daily. 02/07/22  Yes [provider]  fluticasone furoate-vilanterol (BREO ELLIPTA) 100-25 MCG/INH AEPB Inhale 1 puff into the lungs daily as needed (for shortness of breath). 03/08/20  Yes [provider]  furosemide (LASIX) 40 MG tablet Take 40-60 mg by mouth See admin instructions. Take 40mg  by mouth once daily on Monday, Wednesday, Friday, then take 60mg  all other days 09/27/20  Yes [provider]  hydrOXYzine (ATARAX) 25 MG tablet Take 25 mg by mouth 3 (three) times daily. 05/23/21  Yes [provider]  Hyoscyamine Sulfate SL 0.125 MG SUBL Place 1 tablet under the tongue 3 (three) times daily as needed (stomach cramps). 08/23/18  Yes [provider]  IBU 800 MG tablet Take 1 tablet (800 mg total) by mouth every 8 (eight) hours as needed for moderate pain. 10/22/20  Yes Johnson, Clanford L, MD  insulin regular human CONCENTRATED (HUMULIN R) 500 UNIT/ML injection Inject 15-21 Units into the skin See admin instructions. Sliding scale 3 times daily   Yes [provider]  loratadine (CLARITIN) 10 MG tablet Take 10 mg by mouth daily.   Yes [provider]  losartan (COZAAR) 50 MG tablet Take 50 mg by mouth daily. 09/21/20  Yes [provider]  omeprazole (PRILOSEC) 40 MG capsule Take 40 mg  by mouth at bedtime. 06/05/20  Yes [provider]  phenytoin (DILANTIN) 100 MG ER capsule Take 400 mg by mouth daily.   Yes [provider]  prazosin (MINIPRESS) 2 MG capsule Take 2 mg by mouth at bedtime. 11/23/21  Yes [provider]  rosuvastatin (CRESTOR) 40 MG tablet Take 1 tablet by mouth daily. 12/22/18  Yes [provider]  Semaglutide,0.25 or 0.5MG /DOS, 2 MG/1.5ML SOPN Inject 0.25 mg into the skin every Friday. 05/20/21  Yes [provider]  tiZANidine (ZANAFLEX) 4 MG tablet Take 8 mg by mouth at bedtime. 09/01/20  Yes  [provider]  topiramate (TOPAMAX) 100 MG tablet Take 200 mg by mouth 2 (two) times daily. 06/06/20  Yes [provider]  traZODone (DESYREL) 150 MG tablet Take 300 mg by mouth at bedtime. 08/17/20  Yes [provider]  warfarin (COUMADIN) 7.5 MG tablet Take 1 tablet (7.5 mg total) by mouth daily at 4 PM. Patient taking differently: Take 3.75-7.5 mg by mouth See admin instructions. 3.75 mg Tuesday,Thursday,Saturday and Sunday  7.5 mg Monday,Wednesday and Friday 10/22/20 02/14/23 Yes Johnson, Clanford L, MD  atorvastatin (LIPITOR) 40 MG tablet Take 40 mg by mouth daily.  10/21/20  [provider]  citalopram (CELEXA) 20 MG tablet Take by mouth. Patient not taking: Reported on 10/15/2020  10/21/20  [provider]  dicyclomine (BENTYL) 10 MG capsule Take by mouth. Patient not taking: Reported on 10/15/2020  10/21/20  [provider]  fexofenadine (ALLEGRA) 180 MG tablet Take 180 mg by mouth daily.    11/27/19  [provider]  glipiZIDE (GLUCOTROL) 10 MG tablet Take 10 mg by mouth 2 (two) times daily before a meal.    11/27/19  [provider]  lisinopril-hydrochlorothiazide (PRINZIDE,ZESTORETIC) 10-12.5 MG per tablet Take 1 tablet by mouth daily.   Patient not taking: No sig reported  10/21/20  [provider]  sitaGLIPtan-metformin (JANUMET) 50-1000 MG per tablet Take 1 tablet by mouth 2 (two) times daily with a meal.    11/27/19  [provider]  zolpidem (AMBIEN) 10 MG tablet Take 10 mg by mouth at bedtime as needed.    11/27/19  [provider]      Allergies    Latex and Penicillins    Review of Systems   Review of Systems  Physical Exam Updated Vital Signs BP 125/67   Pulse 80   Temp 98.1 F (36.7 C) (Oral)   Resp 15   Ht 5\' 3"  (1.6 m)   Wt (!) 146.5 kg   SpO2 96%   BMI 57.22 kg/m  Physical Exam Vitals and nursing note reviewed.  Constitutional:      General: She is not in acute distress.     Appearance: She is well-developed. She is not diaphoretic.  HENT:     Head: Normocephalic and atraumatic.  Eyes:     Conjunctiva/sclera: Conjunctivae normal.  Cardiovascular:     Rate and Rhythm: Normal rate and regular rhythm.     Heart sounds: Normal heart sounds. No murmur heard.    No friction rub. No gallop.  Pulmonary:     Effort: Pulmonary effort is normal. No respiratory distress.     Breath sounds: Normal breath sounds. No wheezing or rales.  Abdominal:     General: There is no distension.     Palpations: Abdomen is soft.     Tenderness: There is abdominal tenderness. There is no guarding.     Comments: 1cm open wound  with purulent drainage Approx 9cm area of induration surrounding this  Musculoskeletal:        General: No tenderness.     Cervical back: Normal range of motion.  Skin:    General: Skin is warm and dry.     Findings: No erythema or rash.  Neurological:     Mental Status: She is alert and oriented to person, place, and time.     ED Results / Procedures / Treatments   Labs (all labs ordered are listed, but only abnormal results are displayed) Labs Reviewed  BASIC METABOLIC PANEL - Abnormal; Notable for the following components:      Result Value   Potassium 3.4 (*)    Glucose, Bld 125 (*)    Calcium 8.5 (*)    All other components within normal limits  PROTIME-INR - Abnormal; Notable for the following components:   Prothrombin Time 25.9 (*)    INR 2.4 (*)    All other components within normal limits  BASIC METABOLIC PANEL - Abnormal; Notable for the following components:   Potassium 3.4 (*)    Glucose, Bld 232 (*)    BUN 6 (*)    Calcium 8.2 (*)    All other components within normal limits  PROTIME-INR - Abnormal; Notable for the following components:   Prothrombin Time 28.4 (*)    INR 2.7 (*)    All other components within normal limits  HEMOGLOBIN A1C - Abnormal; Notable for the following components:   Hgb A1c MFr Bld 8.1 (*)    All other  components within normal limits  CBG MONITORING, ED - Abnormal; Notable for the following components:   Glucose-Capillary 220 (*)    All other components within normal limits  CBG MONITORING, ED - Abnormal; Notable for the following components:   Glucose-Capillary 176 (*)    All other components within normal limits  CBG MONITORING, ED - Abnormal; Notable for the following components:   Glucose-Capillary 176 (*)    All other components within normal limits  CULTURE, BLOOD (ROUTINE X 2)  CULTURE, BLOOD (ROUTINE X 2)  MRSA NEXT GEN BY PCR, NASAL  CBC WITH DIFFERENTIAL/PLATELET  LACTIC ACID, PLASMA  CBC  MAGNESIUM  BRAIN NATRIURETIC PEPTIDE  HIV ANTIBODY (ROUTINE TESTING W REFLEX)  TROPONIN I (HIGH SENSITIVITY)  TROPONIN I (HIGH SENSITIVITY)    EKG EKG Interpretation  Date/Time:  Monday February 13 2022 20:57:26 EDT Ventricular Rate:  69 PR Interval:  193 QRS Duration: 95 QT Interval:  431 QTC Calculation: 462 R Axis:   34 Text Interpretation: Sinus rhythm Low voltage, precordial leads No significant change since last tracing Confirmed by Alvira MondaySchlossman, Karynn Deblasi ([id number]) on 02/13/2022 10:16:58 PM  Radiology DG Chest 2 View  Result Date: 02/14/2022 CLINICAL DATA:  Shortness of breath EXAM: CHEST - 2 VIEW COMPARISON:  10/15/2020 FINDINGS: Cardiac shadow is enlarged but stable. The lungs are well aerated bilaterally. Prominent central vascularity is again seen and stable. No edema is noted. No focal infiltrate is seen. Degenerative changes of the thoracic spine are noted. IMPRESSION: No acute abnormality noted. Electronically Signed   By: Alcide CleverMark  Lukens M.D.   On: 02/14/2022 00:44   CT ABDOMEN PELVIS W CONTRAST  Result Date: 02/13/2022 CLINICAL DATA:  Abdominal infection EXAM: CT ABDOMEN AND PELVIS WITH CONTRAST TECHNIQUE: Multidetector CT imaging of the abdomen and pelvis was performed using the standard protocol following bolus administration of intravenous contrast. RADIATION DOSE  REDUCTION: This exam was performed according to the departmental dose-optimization program  which includes automated exposure control, adjustment of the mA and/or kV according to patient size and/or use of iterative reconstruction technique. CONTRAST:  OMNIPAQUE IOHEXOL 350 MG/ML SOLN COMPARISON:  06/03/2021 FINDINGS: Lower chest: Lung bases are clear. Hepatobiliary: Liver is within normal limits. Status post cholecystectomy. No intrahepatic or extrahepatic ductal dilatation. Pancreas: Within normal limits. Spleen: Within normal limits. Adrenals/Urinary Tract: Adrenal glands are within normal limits. Kidneys are within normal limits.  No hydronephrosis. Bladder is underdistended with history contrast, within normal limits. Stomach/Bowel: Stomach is notable for a tiny hiatal hernia. No evidence of bowel obstruction. Appendix is not discretely visualized. No colonic wall thickening or inflammatory changes. Vascular/Lymphatic: No evidence of abdominal aortic aneurysm. No suspicious abdominopelvic lymphadenopathy. Reproductive: Status post hysterectomy. No adnexal masses. Other: No abdominopelvic ascites. Musculoskeletal: Mild subcutaneous stranding beneath the right mid anterior abdominal wall (series 3/image 40), possibly a subcutaneous injection site. Mild stranding along the lower anterior abdominal wall (series 8/image 30), chronic, favored to be postsurgical. No drainable fluid collection/abscess. Degenerative changes of the visualized thoracolumbar spine. IMPRESSION: No drainable fluid collection/abscess. No acute findings in the abdomen/pelvis. Electronically Signed   By: Charline Bills M.D.   On: 02/13/2022 20:54    Procedures Procedures    Medications Ordered in ED Medications  vancomycin (VANCOREADY) IVPB 1750 mg/350 mL (has no administration in time range)  busPIRone (BUSPAR) tablet 20 mg (20 mg Oral Given 02/14/22 0758)  ezetimibe (ZETIA) tablet 10 mg (has no administration in time range)   fluticasone furoate-vilanterol (BREO ELLIPTA) 100-25 MCG/INH 1 puff (has no administration in time range)  furosemide (LASIX) tablet 40 mg (has no administration in time range)  hydrOXYzine (ATARAX) tablet 25 mg (25 mg Oral Given 02/14/22 0758)  hyoscyamine (LEVSIN SL) SL tablet 0.125 mg (has no administration in time range)  loratadine (CLARITIN) tablet 10 mg (has no administration in time range)  losartan (COZAAR) tablet 50 mg (has no administration in time range)  pantoprazole (PROTONIX) EC tablet 40 mg (has no administration in time range)  phenytoin (DILANTIN) ER capsule 400 mg (has no administration in time range)  prazosin (MINIPRESS) capsule 2 mg (has no administration in time range)  rosuvastatin (CRESTOR) tablet 40 mg (has no administration in time range)  tiZANidine (ZANAFLEX) tablet 8 mg (has no administration in time range)  topiramate (TOPAMAX) tablet 200 mg (has no administration in time range)  traZODone (DESYREL) tablet 300 mg (has no administration in time range)  acetaminophen (TYLENOL) tablet 650 mg (has no administration in time range)  FLUoxetine (PROZAC) capsule 60 mg (has no administration in time range)  furosemide (LASIX) tablet 60 mg (has no administration in time range)  insulin aspart (novoLOG) injection 0-9 Units (has no administration in time range)  vancomycin (VANCOREADY) IVPB 2000 mg/400 mL (0 mg Intravenous Stopped 02/13/22 2300)  iohexol (OMNIPAQUE) 350 MG/ML injection 100 mL (100 mLs Intravenous Contrast Given 02/13/22 2045)  potassium chloride SA (KLOR-CON M) CR tablet 40 mEq (40 mEq Oral Given 02/14/22 0153)    ED Course/ Medical Decision Making/ A&P                           Medical Decision Making Amount and/or Complexity of Data Reviewed Labs: ordered. Radiology: ordered.  Risk Prescription drug management. Decision regarding hospitalization.   71yo female with history of CHF, DM, hlpd, htn, seizures. MRSA, atrial fibrillation on coumadin,  previous admission in June 2022, January 2023 for abscess , who presents with  concern for abdominal wall abscess.   Given recurrent nature of abscesses in this area, concern for large area and greater depth of infection a CT abdomen was completed.  Labs completed and personally evaluated and interpreted by me show no leukocytosis, no anemia, mild hyperglycemia, no AKI.  CT does not show findings of drainable abscess noted on CT, however physical exam is concerning for abscess.  Given concern for size, possible extension of infection, discussed with Dr. Andrey Campanile of general surgery and she will be evaluated tomorrow morning.  The wound is currently draining purulent drainage.  Given her systemic symptoms, and high risk, will admit for further care of cellulitis and abscess.  Given vancomycin.         Final Clinical Impression(s) / ED Diagnoses Final diagnoses:  Cellulitis of abdominal wall  Abscess of abdominal wall    Rx / DC Orders ED Discharge Orders     None         Alvira Monday, MD 02/14/22 [phone]

## 2022-02-13 NOTE — ED Triage Notes (Signed)
Patient complains of infection to abdomen with odor. [facility] sent her here for staph infection, hx of same.

## 2022-02-13 NOTE — ED Notes (Signed)
Pt c/o CP after returning from CT. EKG captured. MD Schlossman made aware.

## 2022-02-13 NOTE — H&P (Signed)
History and Physical    Donna Snow AYT:[id number] DOB: Sep 16, 1950 DOA: 02/13/2022  PCP: [facility]  Patient coming from: Home  Chief Complaint: Abdominal wall infection  HPI: Donna Snow is a 71 y.o. female with medical history significant of paroxysmal A-fib on Coumadin, anxiety, chronic diastolic CHF, insulin-dependent type 2 diabetes, hypertension, hyperlipidemia, class III obesity, seizures, GERD, OSA, history of abdominal wall abscess in January 2023.  She presents to the ED today complaining of abdominal wall infection.  Vital signs on arrival to the ED: Temperature 98.4 F, pulse 79, respiratory rate 16, blood pressure 167/66, SPO2 94% on room air.  Labs showing no leukocytosis, potassium 3.4, lactate 1.6, PT 25.9/INR 2.4, blood cultures drawn.  CT abdomen pelvis showing no drainable fluid collection/abscess.  ED physician discussed the case with general surgery (Dr. Andrey Campanile), recommended holding Coumadin and general surgery team will consult in the morning.  While in the ED, she had an episode of chest pain after returning from CT.  Initial troponin negative and repeat pending.  Patient states 2 weeks ago she took an insulin shot in her abdomen and then a few days later noticed a bump in that area which then started draining pus.  Reports subjective fevers and chills.  States she went to urgent care and was sent to the ED for further evaluation.  Patient states while in the ED, she had a brief episode of left lateral chest pain which resolved.  Patient thinks this is due to a strap being wrapped around her chest too tightly during CT.  Also reports history of exertional chest pain and dyspnea for several months.  Symptoms occur with minimal exertion.  Reports bilateral lower extremity edema.  She denies history of CAD or prior work-up such as stress test or cardiac catheterization.  Review of Systems:  Review of Systems  All other systems reviewed and are  negative.   Past Medical History:  Diagnosis Date   Anxiety    CHF (congestive heart failure) (HCC)    Diabetes mellitus    Hypercholesteremia    Hypertension    Obesity    Seizures (HCC)     Past Surgical History:  Procedure Laterality Date   ABDOMINAL HYSTERECTOMY     ABDOMINAL SURGERY     CHOLECYSTECTOMY     JOINT REPLACEMENT       reports that she has never smoked. She has never used smokeless tobacco. She reports that she does not drink alcohol and does not use drugs.  Allergies  Allergen Reactions   Latex Other (See Comments) and Hives   Penicillins     Tolerated ceftriaxone 10/2020    Family History  Adopted: Yes    Prior to Admission medications   Medication Sig Start Date End Date Taking? Authorizing Provider  albuterol (VENTOLIN HFA) 108 (90 Base) MCG/ACT inhaler Inhale 2 puffs into the lungs every 4 (four) hours as needed for shortness of breath. 06/06/20   [provider]  budesonide-formoterol (SYMBICORT) 80-4.5 MCG/ACT inhaler Inhale 2 puffs into the lungs 2 (two) times daily. Patient not taking: Reported on 06/03/2021 11/22/18   [provider]  busPIRone (BUSPAR) 10 MG tablet Take 20 mg by mouth 3 (three) times daily. 08/16/20   [provider]  cyclobenzaprine (FLEXERIL) 10 MG tablet Take 1 tablet (10 mg total) by mouth 2 (two) times daily as needed for muscle spasms. Patient not taking: Reported on 06/03/2021 03/07/15   Linwood Dibbles, MD  DULoxetine (CYMBALTA) 60 MG capsule  Take 60 mg by mouth 2 (two) times daily.    [provider]  ezetimibe (ZETIA) 10 MG tablet Take 1 tablet by mouth daily. 07/25/18   [provider]  fluticasone furoate-vilanterol (BREO ELLIPTA) 100-25 MCG/INH AEPB Inhale 1 puff into the lungs daily. 03/08/20   [provider]  furosemide (LASIX) 40 MG tablet Take 40 mg by mouth daily. 09/27/20   [provider]  hydrOXYzine (ATARAX) 25 MG tablet Take 25 mg by mouth 3 (three) times  daily. 05/23/21   [provider]  Hyoscyamine Sulfate SL 0.125 MG SUBL Place 1 tablet under the tongue 3 (three) times daily as needed (stomach cramps). 08/23/18   [provider]  IBU 800 MG tablet Take 1 tablet (800 mg total) by mouth every 8 (eight) hours as needed for moderate pain. 10/22/20   Johnson, Clanford L, MD  insulin regular human CONCENTRATED (HUMULIN R) 500 UNIT/ML injection Inject 12-20 Units into the skin See admin instructions. Sliding scale 3 times daily    [provider]  loratadine (CLARITIN) 10 MG tablet Take 10 mg by mouth daily.    [provider]  losartan (COZAAR) 50 MG tablet Take 1 tablet by mouth daily. 09/21/20   [provider]  omeprazole (PRILOSEC) 40 MG capsule Take 40 mg by mouth daily. 06/05/20   [provider]  phenytoin (DILANTIN) 100 MG ER capsule Take 400 mg by mouth daily.    [provider]  rosuvastatin (CRESTOR) 40 MG tablet Take 1 tablet by mouth daily. 12/22/18   [provider]  Semaglutide,0.25 or 0.5MG /DOS, 2 MG/1.5ML SOPN Inject 0.25 mg into the skin every Friday. 05/20/21   [provider]  tiZANidine (ZANAFLEX) 4 MG tablet Take 8 mg by mouth at bedtime. 09/01/20   [provider]  topiramate (TOPAMAX) 100 MG tablet Take 200 mg by mouth 2 (two) times daily. 06/06/20   [provider]  traZODone (DESYREL) 150 MG tablet Take 150 mg by mouth at bedtime. 08/17/20   [provider]  warfarin (COUMADIN) 7.5 MG tablet Take 1 tablet (7.5 mg total) by mouth daily at 4 PM. Patient taking differently: Take 3.75-7.5 mg by mouth See admin instructions. 3.75 mg Tuesday,Thursday,Saturday and Sunday  7.5 mg Monday,Wednesday and Friday 10/22/20 06/03/21  Irwin Brakeman L, MD  atorvastatin (LIPITOR) 40 MG tablet Take 40 mg by mouth daily.  10/21/20  [provider]  citalopram (CELEXA) 20 MG tablet Take by mouth. Patient not taking: Reported on 10/15/2020  10/21/20   [provider]  dicyclomine (BENTYL) 10 MG capsule Take by mouth. Patient not taking: Reported on 10/15/2020  10/21/20  [provider]  fexofenadine (ALLEGRA) 180 MG tablet Take 180 mg by mouth daily.    11/27/19  [provider]  glipiZIDE (GLUCOTROL) 10 MG tablet Take 10 mg by mouth 2 (two) times daily before a meal.    11/27/19  [provider]  lisinopril-hydrochlorothiazide (PRINZIDE,ZESTORETIC) 10-12.5 MG per tablet Take 1 tablet by mouth daily.   Patient not taking: No sig reported  10/21/20  [provider]  sitaGLIPtan-metformin (JANUMET) 50-1000 MG per tablet Take 1 tablet by mouth 2 (two) times daily with a meal.    11/27/19  [provider]  zolpidem (AMBIEN) 10 MG tablet Take 10 mg by mouth at bedtime as needed.    11/27/19  [provider]    Physical Exam: Vitals:   02/13/22 2000 02/13/22 2109 02/13/22 2115 02/13/22 2215  BP:  131/63  127/62 131/72  Pulse: 72  69 66  Resp: 17  (!) 21 (!) 21  Temp:  98.3 F (36.8 C)    TempSrc:  Oral    SpO2: 98%  94% 93%    Physical Exam Vitals reviewed.  Constitutional:      General: She is not in acute distress. HENT:     Head: Normocephalic and atraumatic.  Eyes:     Extraocular Movements: Extraocular movements intact.  Cardiovascular:     Rate and Rhythm: Normal rate and regular rhythm.     Pulses: Normal pulses.  Pulmonary:     Effort: Pulmonary effort is normal. No respiratory distress.     Breath sounds: No wheezing.     Comments: Examination limited secondary to patient's body habitus Abdominal:     General: Bowel sounds are normal. There is no distension.     Palpations: Abdomen is soft.     Tenderness: There is no abdominal tenderness.  Musculoskeletal:     Cervical back: Normal range of motion.     Right lower leg: Edema present.     Left lower leg: Edema present.     Comments: +1 pitting edema of bilateral lower extremities  Skin:    General: Skin is warm  and dry.     Comments: Area of induration, erythema, and pus drainage noted on the abdomen.  See image.  Neurological:     General: No focal deficit present.     Mental Status: She is alert and oriented to person, place, and time.       Labs on Admission: I have personally reviewed following labs and imaging studies  CBC: Recent Labs  Lab 02/13/22 1145  WBC 6.6  NEUTROABS 4.2  HGB 13.5  HCT 40.3  MCV 86.5  PLT 299   Basic Metabolic Panel: Recent Labs  Lab 02/13/22 1145  NA 138  K 3.4*  CL 106  CO2 24  GLUCOSE 125*  BUN 8  CREATININE 0.82  CALCIUM 8.5*   GFR: CrCl cannot be calculated (Unknown ideal weight.). Liver Function Tests: No results for input(s): "AST", "ALT", "ALKPHOS", "BILITOT", "PROT", "ALBUMIN" in the last 168 hours. No results for input(s): "LIPASE", "AMYLASE" in the last 168 hours. No results for input(s): "AMMONIA" in the last 168 hours. Coagulation Profile: Recent Labs  Lab 02/13/22 2053  INR 2.4*   Cardiac Enzymes: No results for input(s): "CKTOTAL", "CKMB", "CKMBINDEX", "TROPONINI" in the last 168 hours. BNP (last 3 results) No results for input(s): "PROBNP" in the last 8760 hours. HbA1C: No results for input(s): "HGBA1C" in the last 72 hours. CBG: No results for input(s): "GLUCAP" in the last 168 hours. Lipid Profile: No results for input(s): "CHOL", "HDL", "LDLCALC", "TRIG", "CHOLHDL", "LDLDIRECT" in the last 72 hours. Thyroid Function Tests: No results for input(s): "TSH", "T4TOTAL", "FREET4", "T3FREE", "THYROIDAB" in the last 72 hours. Anemia Panel: No results for input(s): "VITAMINB12", "FOLATE", "FERRITIN", "TIBC", "IRON", "RETICCTPCT" in the last 72 hours. Urine analysis:    Component Value Date/Time   COLORURINE YELLOW 10/15/2020 1541   APPEARANCEUR CLEAR 10/15/2020 1541   LABSPEC 1.016 10/15/2020 1541   PHURINE 7.0 10/15/2020 1541   GLUCOSEU >=500 (A) 10/15/2020 1541   HGBUR NEGATIVE 10/15/2020 1541   BILIRUBINUR  NEGATIVE 10/15/2020 1541   KETONESUR 20 (A) 10/15/2020 1541   PROTEINUR NEGATIVE 10/15/2020 1541   UROBILINOGEN 1.0 11/21/2014 1800   NITRITE NEGATIVE 10/15/2020 1541   LEUKOCYTESUR NEGATIVE 10/15/2020 1541    Radiological Exams on Admission:  CT ABDOMEN PELVIS W CONTRAST  Result Date: 02/13/2022 CLINICAL DATA:  Abdominal infection EXAM: CT ABDOMEN AND PELVIS WITH CONTRAST TECHNIQUE: Multidetector CT imaging of the abdomen and pelvis was performed using the standard protocol following bolus administration of intravenous contrast. RADIATION DOSE REDUCTION: This exam was performed according to the departmental dose-optimization program which includes automated exposure control, adjustment of the mA and/or kV according to patient size and/or use of iterative reconstruction technique. CONTRAST:  OMNIPAQUE IOHEXOL 350 MG/ML SOLN COMPARISON:  06/03/2021 FINDINGS: Lower chest: Lung bases are clear. Hepatobiliary: Liver is within normal limits. Status post cholecystectomy. No intrahepatic or extrahepatic ductal dilatation. Pancreas: Within normal limits. Spleen: Within normal limits. Adrenals/Urinary Tract: Adrenal glands are within normal limits. Kidneys are within normal limits.  No hydronephrosis. Bladder is underdistended with history contrast, within normal limits. Stomach/Bowel: Stomach is notable for a tiny hiatal hernia. No evidence of bowel obstruction. Appendix is not discretely visualized. No colonic wall thickening or inflammatory changes. Vascular/Lymphatic: No evidence of abdominal aortic aneurysm. No suspicious abdominopelvic lymphadenopathy. Reproductive: Status post hysterectomy. No adnexal masses. Other: No abdominopelvic ascites. Musculoskeletal: Mild subcutaneous stranding beneath the right mid anterior abdominal wall (series 3/image 40), possibly a subcutaneous injection site. Mild stranding along the lower anterior abdominal wall (series 8/image 30), chronic, favored to be postsurgical.  No drainable fluid collection/abscess. Degenerative changes of the visualized thoracolumbar spine. IMPRESSION: No drainable fluid collection/abscess. No acute findings in the abdomen/pelvis. Electronically Signed   By: Charline Bills M.D.   On: 02/13/2022 20:54    EKG: Independently reviewed.  Sinus rhythm.  No acute ischemic changes when compared to prior tracings.  Assessment and Plan  Abdominal wall cellulitis/abscess CT negative for abscess but exam concerning.  No fever, tachycardia, leukocytosis, or lactic acidosis to suggest sepsis. -Continue vancomycin -Hold Coumadin -General surgery consulted -Monitor WBC count -Blood cultures pending  Chest pain While in the ED, patient had a brief episode of left-sided chest pain after CT which has resolved.  Troponin x1 negative and EKG without acute ischemic changes.  However, she does endorse history of exertional chest pain and dyspnea for several months.  No documented history of CAD but does have multiple risk factors. -Repeat troponin pending -Echocardiogram -I have sent a message to cardiology requesting consultation in the morning.  Mild hypokalemia -Replace potassium and continue to monitor -Check magnesium level  Paroxysmal A-fib Currently in sinus rhythm.  INR 2.4. -Hold Coumadin until patient is seen by general surgery -Monitor PT/INR  Chronic diastolic CHF Does have mild peripheral edema.  Examination of lungs limited due to patient's body habitus.  Not hypoxic.  Echo done in June 2022 showing EF 65 to 70% and grade 1 diastolic dysfunction. -Chest x-ray -Repeat echocardiogram -Check BNP  Insulin-dependent type 2 diabetes A1c 7.5 in January 2023. -Repeat A1c -Sensitive sliding scale insulin -Pharmacy med rec pending.  Anxiety Hypertension: Stable. Hyperlipidemia Seizure disorder GERD -Continue home meds after pharmacy med rec is done.  DVT prophylaxis: Hold Coumadin until patient is seen by general  surgery. Code Status: Full Code (discussed with the patient) Family Communication: No family at bedside. Consults called: General surgery, cardiology Level of care: Telemetry bed Admission status: It is my clinical opinion that admission to INPATIENT is reasonable and necessary because of the expectation that this patient will require hospital care that crosses at least 2 midnights to treat this condition based on the medical complexity of the problems presented.  Given the aforementioned information, the predictability of an adverse  outcome is felt to be significant.   John GiovanniVasundhra Sandra Tellefsen MD [facility]  If 7PM-7AM, please contact night-coverage [email or web address]  02/13/2022, 11:16 PM

## 2022-02-13 NOTE — Progress Notes (Signed)
Pharmacy Antibiotic Note  Donna Snow is a 71 y.o. female for which pharmacy has been consulted for vancomycin dosing for cellulitis.  Patient with a history of HF, DM, HLD, HTN, seizures, MRSA. Patient presenting with concern for abdominal wall abscess.  SCr 0.82 WBC 6.6; LA 1.6; Afeb; HR 77; RR 18>21  Plan: Vancomycin 2000 mg once then 1750 mg q24hr (eAUC 492.5) unless change in renal function Trend WBC, Fever, Renal function F/u cultures, clinical course, WBC, fever De-escalate when able Levels at steady state     Temp (24hrs), Avg:98.3 F (36.8 C), Min:98.2 F (36.8 C), Max:98.4 F (36.9 C)  Recent Labs  Lab 02/13/22 1145  WBC 6.6  CREATININE 0.82    CrCl cannot be calculated (Unknown ideal weight.).    Allergies  Allergen Reactions   Latex Other (See Comments) and Hives   Penicillins     Tolerated ceftriaxone 10/2020    Antimicrobials this admission: vancomycin 10/2 >>   Microbiology results: Pending  Thank you for allowing pharmacy to be a part of this patient's care.  Lorelei Pont, PharmD, BCPS 02/13/2022 8:07 PM ED Clinical Pharmacist -  [phone]

## 2022-02-14 ENCOUNTER — Inpatient Hospital Stay (HOSPITAL_COMMUNITY): Payer: Medicare HMO

## 2022-02-14 ENCOUNTER — Observation Stay (HOSPITAL_BASED_OUTPATIENT_CLINIC_OR_DEPARTMENT_OTHER): Payer: Medicare HMO

## 2022-02-14 ENCOUNTER — Encounter (HOSPITAL_COMMUNITY): Payer: Self-pay | Admitting: Internal Medicine

## 2022-02-14 DIAGNOSIS — R072 Precordial pain: Secondary | ICD-10-CM

## 2022-02-14 DIAGNOSIS — R079 Chest pain, unspecified: Secondary | ICD-10-CM

## 2022-02-14 DIAGNOSIS — L03311 Cellulitis of abdominal wall: Principal | ICD-10-CM | POA: Diagnosis present

## 2022-02-14 HISTORY — DX: Cellulitis of abdominal wall: L03.311

## 2022-02-14 LAB — CBC
HCT: 38.4 % (ref 36.0–46.0)
Hemoglobin: 12.8 g/dL (ref 12.0–15.0)
MCH: 28.7 pg (ref 26.0–34.0)
MCHC: 33.3 g/dL (ref 30.0–36.0)
MCV: 86.1 fL (ref 80.0–100.0)
Platelets: 304 10*3/uL (ref 150–400)
RBC: 4.46 MIL/uL (ref 3.87–5.11)
RDW: 13.2 % (ref 11.5–15.5)
WBC: 5.7 10*3/uL (ref 4.0–10.5)
nRBC: 0 % (ref 0.0–0.2)

## 2022-02-14 LAB — ECHOCARDIOGRAM COMPLETE
AR max vel: 2.42 cm2
AV Area VTI: 2.64 cm2
AV Area mean vel: 2.44 cm2
AV Mean grad: 6 mmHg
AV Peak grad: 10.5 mmHg
Ao pk vel: 1.62 m/s
Area-P 1/2: 4.49 cm2
Height: 63 in
S' Lateral: 3.9 cm
Weight: 5168 oz

## 2022-02-14 LAB — CBG MONITORING, ED
Glucose-Capillary: 220 mg/dL — ABNORMAL HIGH (ref 70–99)
Glucose-Capillary: 176 mg/dL — ABNORMAL HIGH (ref 70–99)
Glucose-Capillary: 176 mg/dL — ABNORMAL HIGH (ref 70–99)
Glucose-Capillary: 157 mg/dL — ABNORMAL HIGH (ref 70–99)
Glucose-Capillary: 193 mg/dL — ABNORMAL HIGH (ref 70–99)

## 2022-02-14 LAB — MAGNESIUM: Magnesium: 1.7 mg/dL (ref 1.7–2.4)

## 2022-02-14 LAB — PROTIME-INR
INR: 2.7 — ABNORMAL HIGH (ref 0.8–1.2)
Prothrombin Time: 28.4 seconds — ABNORMAL HIGH (ref 11.4–15.2)

## 2022-02-14 LAB — BASIC METABOLIC PANEL
Anion gap: 9 (ref 5–15)
BUN: 6 mg/dL — ABNORMAL LOW (ref 8–23)
CO2: 24 mmol/L (ref 22–32)
Calcium: 8.2 mg/dL — ABNORMAL LOW (ref 8.9–10.3)
Chloride: 104 mmol/L (ref 98–111)
Creatinine, Ser: 0.74 mg/dL (ref 0.44–1.00)
GFR, Estimated: >60 mL/min (ref >60)
Glucose, Bld: 232 mg/dL — ABNORMAL HIGH (ref 70–99)
Potassium: 3.4 mmol/L — ABNORMAL LOW (ref 3.5–5.1)
Sodium: 137 mmol/L (ref 135–145)

## 2022-02-14 LAB — HEMOGLOBIN A1C
Hgb A1c MFr Bld: 8.1 % — ABNORMAL HIGH (ref 4.8–5.6)
Mean Plasma Glucose: 185.77 mg/dL

## 2022-02-14 LAB — TROPONIN I (HIGH SENSITIVITY): Troponin I (High Sensitivity): 6 ng/L (ref <18)

## 2022-02-14 LAB — GLUCOSE, CAPILLARY: Glucose-Capillary: 221 mg/dL — ABNORMAL HIGH (ref 70–99)

## 2022-02-14 LAB — BRAIN NATRIURETIC PEPTIDE: B Natriuretic Peptide: 22 pg/mL (ref 0.0–100.0)

## 2022-02-14 MED ORDER — ACETAMINOPHEN 650 MG RE SUPP: 650.0000 mg | Freq: Four times a day (QID) | RECTAL | Status: DC | PRN

## 2022-02-14 MED ORDER — FLUOXETINE HCL 20 MG PO CAPS
60.0000 mg | ORAL_CAPSULE | Freq: Every day | ORAL | Status: DC
  Administered 2022-02-14 – 2022-02-17 (×4): 60 mg via ORAL
  Filled 2022-02-14 (×4): qty 3

## 2022-02-14 MED ORDER — INSULIN GLARGINE-YFGN 100 UNIT/ML ~~LOC~~ SOLN
10.0000 [IU] | Freq: Every day | SUBCUTANEOUS | Status: DC
  Administered 2022-02-14: 10 [IU] via SUBCUTANEOUS
  Filled 2022-02-14 (×2): qty 0.1

## 2022-02-14 MED ORDER — BUSPIRONE HCL 5 MG PO TABS
20.0000 mg | ORAL_TABLET | Freq: Three times a day (TID) | ORAL | Status: DC
  Administered 2022-02-14 – 2022-02-17 (×10): 20 mg via ORAL
  Filled 2022-02-14 (×2): qty 1
  Filled 2022-02-14: qty 2
  Filled 2022-02-14 (×4): qty 1
  Filled 2022-02-14: qty 2
  Filled 2022-02-14 (×2): qty 1

## 2022-02-14 MED ORDER — ACETAMINOPHEN 325 MG PO TABS
650.0000 mg | ORAL_TABLET | Freq: Four times a day (QID) | ORAL | Status: DC | PRN
  Administered 2022-02-14: 650 mg via ORAL
  Filled 2022-02-14: qty 2

## 2022-02-14 MED ORDER — ROSUVASTATIN CALCIUM 20 MG PO TABS
40.0000 mg | ORAL_TABLET | Freq: Every day | ORAL | Status: DC
  Administered 2022-02-14 – 2022-02-17 (×4): 40 mg via ORAL
  Filled 2022-02-14 (×4): qty 2

## 2022-02-14 MED ORDER — PRAZOSIN HCL 2 MG PO CAPS
2.0000 mg | ORAL_CAPSULE | Freq: Every day | ORAL | Status: DC
  Administered 2022-02-14 – 2022-02-16 (×3): 2 mg via ORAL
  Filled 2022-02-14 (×5): qty 1

## 2022-02-14 MED ORDER — FLUOXETINE HCL 40 MG PO CAPS: 40.0000 mg | ORAL_CAPSULE | Freq: Every day | ORAL | Status: DC

## 2022-02-14 MED ORDER — FLUOXETINE HCL 20 MG PO CAPS: 20.0000 mg | ORAL_CAPSULE | Freq: Every day | ORAL | Status: DC

## 2022-02-14 MED ORDER — FLUTICASONE FUROATE-VILANTEROL 100-25 MCG/ACT IN AEPB
1.0000 | INHALATION_SPRAY | Freq: Every day | RESPIRATORY_TRACT | Status: DC
  Administered 2022-02-14 – 2022-02-15 (×2): 1 via RESPIRATORY_TRACT
  Filled 2022-02-14 (×2): qty 28

## 2022-02-14 MED ORDER — PHENYTOIN SODIUM EXTENDED 100 MG PO CAPS
400.0000 mg | ORAL_CAPSULE | Freq: Every day | ORAL | Status: DC
  Administered 2022-02-14 – 2022-02-17 (×4): 400 mg via ORAL
  Filled 2022-02-14 (×4): qty 4

## 2022-02-14 MED ORDER — EZETIMIBE 10 MG PO TABS
10.0000 mg | ORAL_TABLET | Freq: Every day | ORAL | Status: DC
  Administered 2022-02-14 – 2022-02-17 (×4): 10 mg via ORAL
  Filled 2022-02-14 (×4): qty 1

## 2022-02-14 MED ORDER — INSULIN ASPART 100 UNIT/ML IJ SOLN
0.0000 [IU] | INTRAMUSCULAR | Status: DC
  Administered 2022-02-14: 3 [IU] via SUBCUTANEOUS
  Administered 2022-02-14: 2 [IU] via SUBCUTANEOUS

## 2022-02-14 MED ORDER — PHYTONADIONE 5 MG PO TABS
5.0000 mg | ORAL_TABLET | Freq: Once | ORAL | Status: AC
  Administered 2022-02-14: 5 mg via ORAL
  Filled 2022-02-14: qty 1

## 2022-02-14 MED ORDER — FUROSEMIDE 40 MG PO TABS
40.0000 mg | ORAL_TABLET | ORAL | Status: DC
  Administered 2022-02-15 – 2022-02-17 (×2): 40 mg via ORAL
  Filled 2022-02-14 (×2): qty 1

## 2022-02-14 MED ORDER — TOPIRAMATE 100 MG PO TABS
200.0000 mg | ORAL_TABLET | Freq: Two times a day (BID) | ORAL | Status: DC
  Administered 2022-02-14 – 2022-02-17 (×7): 200 mg via ORAL
  Filled 2022-02-14: qty 8
  Filled 2022-02-14 (×8): qty 2

## 2022-02-14 MED ORDER — TRAZODONE HCL 150 MG PO TABS
300.0000 mg | ORAL_TABLET | Freq: Every day | ORAL | Status: DC
  Administered 2022-02-14 – 2022-02-16 (×3): 300 mg via ORAL
  Filled 2022-02-14 (×3): qty 2

## 2022-02-14 MED ORDER — LORATADINE 10 MG PO TABS
10.0000 mg | ORAL_TABLET | Freq: Every day | ORAL | Status: DC
  Administered 2022-02-14 – 2022-02-17 (×4): 10 mg via ORAL
  Filled 2022-02-14 (×4): qty 1

## 2022-02-14 MED ORDER — POTASSIUM CHLORIDE CRYS ER 20 MEQ PO TBCR
40.0000 meq | EXTENDED_RELEASE_TABLET | Freq: Once | ORAL | Status: AC
  Administered 2022-02-14: 40 meq via ORAL
  Filled 2022-02-14: qty 2

## 2022-02-14 MED ORDER — ACETAMINOPHEN 325 MG PO TABS
650.0000 mg | ORAL_TABLET | Freq: Four times a day (QID) | ORAL | Status: DC | PRN
  Administered 2022-02-14 – 2022-02-16 (×3): 650 mg via ORAL
  Filled 2022-02-14 (×3): qty 2

## 2022-02-14 MED ORDER — TIZANIDINE HCL 2 MG PO TABS
8.0000 mg | ORAL_TABLET | Freq: Every day | ORAL | Status: DC
  Administered 2022-02-14 – 2022-02-16 (×3): 8 mg via ORAL
  Filled 2022-02-14 (×3): qty 4

## 2022-02-14 MED ORDER — LOSARTAN POTASSIUM 50 MG PO TABS
50.0000 mg | ORAL_TABLET | Freq: Every day | ORAL | Status: DC
  Administered 2022-02-14 – 2022-02-17 (×4): 50 mg via ORAL
  Filled 2022-02-14 (×4): qty 1

## 2022-02-14 MED ORDER — INSULIN ASPART 100 UNIT/ML IJ SOLN
0.0000 [IU] | Freq: Three times a day (TID) | INTRAMUSCULAR | Status: DC
  Administered 2022-02-14 (×2): 2 [IU] via SUBCUTANEOUS
  Administered 2022-02-15: 5 [IU] via SUBCUTANEOUS
  Administered 2022-02-15: 3 [IU] via SUBCUTANEOUS
  Administered 2022-02-16: 7 [IU] via SUBCUTANEOUS

## 2022-02-14 MED ORDER — FUROSEMIDE 40 MG PO TABS
60.0000 mg | ORAL_TABLET | ORAL | Status: DC
  Administered 2022-02-14 – 2022-02-16 (×2): 60 mg via ORAL
  Filled 2022-02-14: qty 3
  Filled 2022-02-14: qty 1

## 2022-02-14 MED ORDER — HYDROXYZINE HCL 25 MG PO TABS
25.0000 mg | ORAL_TABLET | Freq: Three times a day (TID) | ORAL | Status: DC
  Administered 2022-02-14 – 2022-02-17 (×10): 25 mg via ORAL
  Filled 2022-02-14 (×10): qty 1

## 2022-02-14 MED ORDER — HYOSCYAMINE SULFATE 0.125 MG SL SUBL: 0.1250 mg | SUBLINGUAL_TABLET | Freq: Three times a day (TID) | SUBLINGUAL | Status: DC | PRN

## 2022-02-14 MED ORDER — PANTOPRAZOLE SODIUM 40 MG PO TBEC
40.0000 mg | DELAYED_RELEASE_TABLET | Freq: Every day | ORAL | Status: DC
  Administered 2022-02-14 – 2022-02-17 (×4): 40 mg via ORAL
  Filled 2022-02-14 (×4): qty 1
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
## 2022-02-14 NOTE — ED Notes (Signed)
Attempted to call [facility] to request purple man with no answer.

## 2022-02-14 NOTE — ED Notes (Signed)
Called [facility] and requested purple man be initiated.  

## 2022-02-14 NOTE — ED Notes (Signed)
Called [facility] x2 and requested purple man.

## 2022-02-14 NOTE — ED Notes (Signed)
ED TO INPATIENT HANDOFF REPORT  ED Nurse Name and Phone #: Stanton Kidney, RN  S Name/Age/Gender Donna Snow 71 y.o. female Room/Bed: 039C/039C  Code Status   Code Status: Full Code  Home/SNF/Other Home Patient oriented to: self, place, time, and situation Is this baseline? Yes   Triage Complete: Triage complete  Chief Complaint Abdominal wall cellulitis [E52.778] Cellulitis of abdominal wall [E42.353]  Triage Note Patient complains of infection to abdomen with odor. UCC sent her here for staph infection, hx of same.   Allergies Allergies  Allergen Reactions   Latex Other (See Comments)    Welts - cant wear them, but can be touched by someone who wears them.   Penicillins Hives and Itching    Tolerated ceftriaxone 10/2020 " facial swelling"    Level of Care/Admitting Diagnosis ED Disposition     ED Disposition  Admit   Condition  --   [place]: [place] [[id number]]  Level of Care: Med-Surg [16]  May place patient in observation at [facility] or [place] if equivalent level of care is available:: Yes  Covid Evaluation: Asymptomatic - no recent exposure (last 10 days) testing not required  Diagnosis: Cellulitis of abdominal wall [614431]  Admitting Physician: Thereasa Solo, JEFFREY T [[id number]]  Attending Physician: Thereasa Solo, JEFFREY T [[id number]]          B Medical/Surgery History Past Medical History:  Diagnosis Date   Anxiety    CHF (congestive heart failure) ([place])    Diabetes mellitus    Hypercholesteremia    Hypertension    Obesity    Seizures ([place])    Past Surgical History:  Procedure Laterality Date   ABDOMINAL HYSTERECTOMY     ABDOMINAL SURGERY     CHOLECYSTECTOMY     JOINT REPLACEMENT       A IV Location/Drains/Wounds Patient Lines/Drains/Airways Status     Active Line/Drains/Airways     Name Placement date Placement time Site Days   Peripheral IV 02/13/22 20 G Anterior;Right Forearm 02/13/22  2135  Forearm  1    External Urinary Catheter --  --  --  --   Wound / Incision (Open or Dehisced) 10/15/20 Incision - Open;Puncture Abdomen Lower;Medial;Right Abcess. I&D washout. 10/15/20  2100  Abdomen  487            Intake/Output Last 24 hours  Intake/Output Summary (Last 24 hours) at 02/14/2022 1934 Last data filed at 02/14/2022 0737 Gross per 24 hour  Intake --  Output 200 ml  Net -200 ml    Labs/Imaging Results for orders placed or performed during the hospital encounter of 02/13/22 (from the past 48 hour(s))  CBC with Differential     Status: None   Collection Time: 02/13/22 11:45 AM  Result Value Ref Range   WBC 6.6 4.0 - 10.5 K/uL   RBC 4.66 3.87 - 5.11 MIL/uL   Hemoglobin 13.5 12.0 - 15.0 g/dL   HCT 40.3 36.0 - 46.0 %   MCV 86.5 80.0 - 100.0 fL   MCH 29.0 26.0 - 34.0 pg   MCHC 33.5 30.0 - 36.0 g/dL   RDW 13.3 11.5 - 15.5 %   Platelets 299 150 - 400 K/uL   nRBC 0.0 0.0 - 0.2 %   Neutrophils Relative % 63 %   Neutro Abs 4.2 1.7 - 7.7 K/uL   Lymphocytes Relative 24 %   Lymphs Abs 1.6 0.7 - 4.0 K/uL   Monocytes Relative 8 %   Monocytes Absolute 0.5  0.1 - 1.0 K/uL   Eosinophils Relative 3 %   Eosinophils Absolute 0.2 0.0 - 0.5 K/uL   Basophils Relative 1 %   Basophils Absolute 0.0 0.0 - 0.1 K/uL   Immature Granulocytes 1 %   Abs Immature Granulocytes 0.03 0.00 - 0.07 K/uL    Comment: Performed at [facility], [address] [place], [place] [address]  Basic metabolic panel     Status: Abnormal   Collection Time: 02/13/22 11:45 AM  Result Value Ref Range   Sodium 138 135 - 145 mmol/L   Potassium 3.4 (L) 3.5 - 5.1 mmol/L   Chloride 106 98 - 111 mmol/L   CO2 24 22 - 32 mmol/L   Glucose, Bld 125 (H) 70 - 99 mg/dL    Comment: Glucose reference range applies only to samples taken after fasting for at least 8 hours.   BUN 8 8 - 23 mg/dL   Creatinine, Ser 6.04 0.44 - 1.00 mg/dL   Calcium 8.5 (L) 8.9 - 10.3 mg/dL   GFR, Estimated >54 >09 mL/min    Comment:  (NOTE) Calculated using the CKD-EPI Creatinine Equation (2021)    Anion gap 8 5 - 15    Comment: Performed at [facility], [address] [place], [place] [address]  Blood culture (routine x 2)     Status: None (Preliminary result)   Collection Time: 02/13/22  8:16 PM   Specimen: BLOOD RIGHT FOREARM  Result Value Ref Range   Specimen Description BLOOD RIGHT FOREARM    Special Requests      BOTTLES DRAWN AEROBIC AND ANAEROBIC Blood Culture adequate volume   Culture      NO GROWTH < 12 HOURS Performed at [facility], [address] [place], [place] [address]    Report Status PENDING   Blood culture (routine x 2)     Status: None (Preliminary result)   Collection Time: 02/13/22  8:52 PM   Specimen: BLOOD  Result Value Ref Range   Specimen Description BLOOD SITE NOT SPECIFIED    Special Requests      BOTTLES DRAWN AEROBIC AND ANAEROBIC Blood Culture adequate volume   Culture      NO GROWTH < 12 HOURS Performed at [facility], [address] [place], [place] [address]    Report Status PENDING   Protime-INR     Status: Abnormal   Collection Time: 02/13/22  8:53 PM  Result Value Ref Range   Prothrombin Time 25.9 (H) 11.4 - 15.2 seconds   INR 2.4 (H) 0.8 - 1.2    Comment: (NOTE) INR goal varies based on device and disease states. Performed at [facility], [address] [place], [place] [address]   Lactic acid, plasma     Status: None   Collection Time: 02/13/22  8:53 PM  Result Value Ref Range   Lactic Acid, Venous 1.6 0.5 - 1.9 mmol/L    Comment: Performed at [facility], [address] [place], [place] [address]  Troponin I (High Sensitivity)     Status: None   Collection Time: 02/13/22  9:38 PM  Result Value Ref Range   Troponin I (High Sensitivity) 5 <18 ng/L    Comment: (NOTE) Elevated high sensitivity troponin I (hsTnI) values and significant  changes across serial measurements may suggest ACS but many other  chronic and acute  conditions are known to elevate hsTnI results.  Refer to the "Links" section for  chest pain algorithms and additional  guidance. Performed at [facility], [address] [place], [place] [address]   CBG monitoring, ED     Status: Abnormal   Collection Time: 02/14/22  1:52 AM  Result Value Ref Range   Glucose-Capillary 220 (H) 70 - 99 mg/dL    Comment: Glucose reference range applies only to samples taken after fasting for at least 8 hours.  CBC     Status: None   Collection Time: 02/14/22  2:00 AM  Result Value Ref Range   WBC 5.7 4.0 - 10.5 K/uL   RBC 4.46 3.87 - 5.11 MIL/uL   Hemoglobin 12.8 12.0 - 15.0 g/dL   HCT 60.438.4 54.036.0 - 98.146.0 %   MCV 86.1 80.0 - 100.0 fL   MCH 28.7 26.0 - 34.0 pg   MCHC 33.3 30.0 - 36.0 g/dL   RDW 19.113.2 47.811.5 - 29.515.5 %   Platelets 304 150 - 400 K/uL   nRBC 0.0 0.0 - 0.2 %    Comment: Performed at [facility], [address] [place], [place] [address]  Basic metabolic panel     Status: Abnormal   Collection Time: 02/14/22  2:00 AM  Result Value Ref Range   Sodium 137 135 - 145 mmol/L   Potassium 3.4 (L) 3.5 - 5.1 mmol/L   Chloride 104 98 - 111 mmol/L   CO2 24 22 - 32 mmol/L   Glucose, Bld 232 (H) 70 - 99 mg/dL    Comment: Glucose reference range applies only to samples taken after fasting for at least 8 hours.   BUN 6 (L) 8 - 23 mg/dL   Creatinine, Ser 8.650.74 0.44 - 1.00 mg/dL   Calcium 8.2 (L) 8.9 - 10.3 mg/dL   GFR, Estimated >78>60 >46>60 mL/min    Comment: (NOTE) Calculated using the CKD-EPI Creatinine Equation (2021)    Anion gap 9 5 - 15    Comment: Performed at [facility], [address] [place], [place] [address]  Magnesium     Status: None   Collection Time: 02/14/22  2:00 AM  Result Value Ref Range   Magnesium 1.7 1.7 - 2.4 mg/dL    Comment: Performed at [facility], [address] [place], [place] [address]  Protime-INR     Status: Abnormal   Collection Time: 02/14/22  2:00 AM  Result Value Ref Range    Prothrombin Time 28.4 (H) 11.4 - 15.2 seconds   INR 2.7 (H) 0.8 - 1.2    Comment: (NOTE) INR goal varies based on device and disease states. Performed at [facility], [address] [place], [place] [address]   Hemoglobin A1c     Status: Abnormal   Collection Time: 02/14/22  2:00 AM  Result Value Ref Range   Hgb A1c MFr Bld 8.1 (H) 4.8 - 5.6 %    Comment: (NOTE) Pre diabetes:          5.7%-6.4%  Diabetes:              >6.4%  Glycemic control for   <7.0% adults with diabetes    Mean Plasma Glucose 185.77 mg/dL    Comment: Performed at [facility], [address] [place], [place] [address]  Brain natriuretic peptide     Status: None   Collection Time: 02/14/22  2:00 AM  Result Value Ref Range   B Natriuretic Peptide 22.0 0.0 - 100.0 pg/mL    Comment:  Performed at [facility], [address] [place], [place] [address]  CBG monitoring, ED     Status: Abnormal   Collection Time: 02/14/22  4:05 AM  Result Value Ref Range   Glucose-Capillary 176 (H) 70 - 99 mg/dL    Comment: Glucose reference range applies only to samples taken after fasting for at least 8 hours.   Comment 1 Notify RN    Comment 2 Document in Chart   CBG monitoring, ED     Status: Abnormal   Collection Time: 02/14/22  6:00 AM  Result Value Ref Range   Glucose-Capillary 176 (H) 70 - 99 mg/dL    Comment: Glucose reference range applies only to samples taken after fasting for at least 8 hours.   Comment 1 Notify RN    Comment 2 Document in Chart   Troponin I (High Sensitivity)     Status: None   Collection Time: 02/14/22  9:36 AM  Result Value Ref Range   Troponin I (High Sensitivity) 6 <18 ng/L    Comment: (NOTE) Elevated high sensitivity troponin I (hsTnI) values and significant  changes across serial measurements may suggest ACS but many other  chronic and acute conditions are known to elevate hsTnI results.  Refer to the "Links" section for chest pain algorithms and additional   guidance. Performed at [facility], [address], [place], [place] [address]   CBG monitoring, ED     Status: Abnormal   Collection Time: 02/14/22 11:52 AM  Result Value Ref Range   Glucose-Capillary 157 (H) 70 - 99 mg/dL    Comment: Glucose reference range applies only to samples taken after fasting for at least 8 hours.  CBG monitoring, ED     Status: Abnormal   Collection Time: 02/14/22  4:38 PM  Result Value Ref Range   Glucose-Capillary 193 (H) 70 - 99 mg/dL    Comment: Glucose reference range applies only to samples taken after fasting for at least 8 hours.   ECHOCARDIOGRAM COMPLETE  Result Date: 02/14/2022    ECHOCARDIOGRAM REPORT   Patient Name:   Donna Snow Date of Exam: 02/14/2022 Medical Rec #:  [id number]    Height:       63.0 in Accession #:    [id number]   Weight:       323.0 lb Date of Birth:  12-10-1950    BSA:          2.371 m Patient Age:    70 years     BP:           140/63 mmHg Patient Gender: F            HR:           70 bpm. Exam Location:  Inpatient Procedure: 2D Echo, Cardiac Doppler and Color Doppler Indications:    Chest pain R07.9  History:        Patient has prior history of Echocardiogram examinations, most                 recent 10/16/2020. Angina, Arrythmias:Atrial Fibrillation; Risk                 Factors:Hypertension, Sleep Apnea, Diabetes and Dyslipidemia.  Sonographer:    Lucendia Herrlich Referring Phys: [id number] OVFIEPPIR RATHORE  Sonographer Comments: Exam terminated per patient request IMPRESSIONS  1. Left ventricular ejection fraction, by estimation, is 55 to 60%. The left ventricle has normal function. Left ventricular endocardial border not optimally defined  to evaluate regional wall motion. The left ventricular internal cavity size was mildly dilated. Left ventricular diastolic parameters are consistent with Grade I diastolic dysfunction (impaired relaxation). Elevated left atrial pressure.  2. Right ventricular systolic function is normal. The  right ventricular size is normal. Tricuspid regurgitation signal is inadequate for assessing PA pressure.  3. Left atrial size was severely dilated.  4. The mitral valve is normal in structure. Trivial mitral valve regurgitation.  5. The aortic valve is tricuspid. There is mild calcification of the aortic valve. Aortic valve regurgitation is not visualized. Aortic valve sclerosis is present, with no evidence of aortic valve stenosis. Comparison(s): No significant change from prior study. Prior images reviewed side by side. FINDINGS  Left Ventricle: Images are not adequate to evaluate regional wall motion. The patient refused administration of Definity contrast. Left ventricular ejection fraction, by estimation, is 55 to 60%. The left ventricle has normal function. Left ventricular endocardial border not optimally defined to evaluate regional wall motion. The left ventricular internal cavity size was mildly dilated. There is no left ventricular hypertrophy. Left ventricular diastolic parameters are consistent with Grade I diastolic  dysfunction (impaired relaxation). Elevated left atrial pressure. Right Ventricle: The right ventricular size is normal. Right vetricular wall thickness was not well visualized. Right ventricular systolic function is normal. Tricuspid regurgitation signal is inadequate for assessing PA pressure. Left Atrium: Left atrial size was severely dilated. Right Atrium: Right atrial size was normal in size. Pericardium: There is no evidence of pericardial effusion. Mitral Valve: The mitral valve is normal in structure. Trivial mitral valve regurgitation. Tricuspid Valve: The tricuspid valve is grossly normal. Tricuspid valve regurgitation is not demonstrated. Aortic Valve: The aortic valve is tricuspid. There is mild calcification of the aortic valve. Aortic valve regurgitation is not visualized. Aortic valve sclerosis is present, with no evidence of aortic valve stenosis. Aortic valve mean  gradient measures 6.0 mmHg. Aortic valve peak gradient measures 10.5 mmHg. Aortic valve area, by VTI measures 2.64 cm. Pulmonic Valve: The pulmonic valve was not well visualized. Pulmonic valve regurgitation is not visualized. Aorta: The aortic root is normal in size and structure. IAS/Shunts: No atrial level shunt detected by color flow Doppler.  LEFT VENTRICLE PLAX 2D LVIDd:         5.80 cm   Diastology LVIDs:         3.90 cm   LV e' medial:    7.18 cm/s LV PW:         0.77 cm   LV E/e' medial:  13.8 LV IVS:        1.13 cm   LV e' lateral:   9.03 cm/s LVOT diam:     2.20 cm   LV E/e' lateral: 11.0 LV SV:         89 LV SV Index:   37 LVOT Area:     3.80 cm  LEFT ATRIUM           Index        RIGHT ATRIUM           Index LA diam:      5.40 cm 2.28 cm/m   RA Area:     15.70 cm LA Vol (A4C): 67.4 ml 28.43 ml/m  RA Volume:   41.10 ml  17.34 ml/m  AORTIC VALVE AV Area (Vmax):    2.42 cm AV Area (Vmean):   2.44 cm AV Area (VTI):     2.64 cm AV Vmax:  162.00 cm/s AV Vmean:          108.000 cm/s AV VTI:            0.335 m AV Peak Grad:      10.5 mmHg AV Mean Grad:      6.0 mmHg LVOT Vmax:         103.00 cm/s LVOT Vmean:        69.200 cm/s LVOT VTI:          0.233 m LVOT/AV VTI ratio: 0.70  AORTA Ao Root diam: 3.30 cm Ao Asc diam:  2.90 cm MITRAL VALVE MV Area (PHT): 4.49 cm     SHUNTS MV Decel Time: 169 msec     Systemic VTI:  0.23 m MV E velocity: 99.00 cm/s   Systemic Diam: 2.20 cm MV A velocity: 106.00 cm/s MV E/A ratio:  0.93 Mihai Croitoru MD Electronically signed by Thurmon Fair MD Signature Date/Time: 02/14/2022/2:06:09 PM    Final    DG Chest 2 View  Result Date: 02/14/2022 CLINICAL DATA:  Shortness of breath EXAM: CHEST - 2 VIEW COMPARISON:  10/15/2020 FINDINGS: Cardiac shadow is enlarged but stable. The lungs are well aerated bilaterally. Prominent central vascularity is again seen and stable. No edema is noted. No focal infiltrate is seen. Degenerative changes of the thoracic spine are  noted. IMPRESSION: No acute abnormality noted. Electronically Signed   By: Alcide Clever M.D.   On: 02/14/2022 00:44   CT ABDOMEN PELVIS W CONTRAST  Result Date: 02/13/2022 CLINICAL DATA:  Abdominal infection EXAM: CT ABDOMEN AND PELVIS WITH CONTRAST TECHNIQUE: Multidetector CT imaging of the abdomen and pelvis was performed using the standard protocol following bolus administration of intravenous contrast. RADIATION DOSE REDUCTION: This exam was performed according to the departmental dose-optimization program which includes automated exposure control, adjustment of the mA and/or kV according to patient size and/or use of iterative reconstruction technique. CONTRAST:  OMNIPAQUE IOHEXOL 350 MG/ML SOLN COMPARISON:  06/03/2021 FINDINGS: Lower chest: Lung bases are clear. Hepatobiliary: Liver is within normal limits. Status post cholecystectomy. No intrahepatic or extrahepatic ductal dilatation. Pancreas: Within normal limits. Spleen: Within normal limits. Adrenals/Urinary Tract: Adrenal glands are within normal limits. Kidneys are within normal limits.  No hydronephrosis. Bladder is underdistended with history contrast, within normal limits. Stomach/Bowel: Stomach is notable for a tiny hiatal hernia. No evidence of bowel obstruction. Appendix is not discretely visualized. No colonic wall thickening or inflammatory changes. Vascular/Lymphatic: No evidence of abdominal aortic aneurysm. No suspicious abdominopelvic lymphadenopathy. Reproductive: Status post hysterectomy. No adnexal masses. Other: No abdominopelvic ascites. Musculoskeletal: Mild subcutaneous stranding beneath the right mid anterior abdominal wall (series 3/image 40), possibly a subcutaneous injection site. Mild stranding along the lower anterior abdominal wall (series 8/image 30), chronic, favored to be postsurgical. No drainable fluid collection/abscess. Degenerative changes of the visualized thoracolumbar spine. IMPRESSION: No drainable fluid  collection/abscess. No acute findings in the abdomen/pelvis. Electronically Signed   By: Charline Bills M.D.   On: 02/13/2022 20:54    Pending Labs Unresulted Labs (From admission, onward)     Start     Ordered   02/15/22 0500  Protime-INR  Tomorrow morning,   R        02/14/22 1529   02/15/22 0500  CBC  Tomorrow morning,   R        02/14/22 1529   02/15/22 0500  Basic metabolic panel  Tomorrow morning,   R        02/14/22 1529  02/15/22 0500  Magnesium  Tomorrow morning,   R        02/14/22 1529   02/14/22 0741  MRSA Next Gen by PCR, Nasal  ONCE - URGENT,   URGENT        02/14/22 0740   02/14/22 0007  HIV Antibody (routine testing w rflx)  (HIV Antibody (Routine testing w reflex) panel)  Once,   R        02/14/22 0010            Vitals/Pain Today's Vitals   02/14/22 1400 02/14/22 1433 02/14/22 1738 02/14/22 1740  BP: 136/62   123/81  Pulse: 70   75  Resp: 20   (!) 23  Temp:  (!) 97.4 F (36.3 C) 98.7 F (37.1 C)   TempSrc:      SpO2: 98%   97%  Weight:      Height:      PainSc:        Isolation Precautions No active isolations  Medications Medications  vancomycin (VANCOREADY) IVPB 1750 mg/350 mL (has no administration in time range)  busPIRone (BUSPAR) tablet 20 mg (20 mg Oral Given 02/14/22 1623)  ezetimibe (ZETIA) tablet 10 mg (10 mg Oral Given 02/14/22 0939)  fluticasone furoate-vilanterol (BREO ELLIPTA) 100-25 MCG/ACT 1 puff (1 puff Inhalation Given 02/14/22 1358)  furosemide (LASIX) tablet 40 mg (has no administration in time range)  hydrOXYzine (ATARAX) tablet 25 mg (25 mg Oral Given 02/14/22 1623)  hyoscyamine (LEVSIN SL) SL tablet 0.125 mg (has no administration in time range)  loratadine (CLARITIN) tablet 10 mg (10 mg Oral Given 02/14/22 0939)  losartan (COZAAR) tablet 50 mg (50 mg Oral Given 02/14/22 0939)  pantoprazole (PROTONIX) EC tablet 40 mg (40 mg Oral Given 02/14/22 0939)  phenytoin (DILANTIN) ER capsule 400 mg (400 mg Oral Given 02/14/22 0938)   prazosin (MINIPRESS) capsule 2 mg (has no administration in time range)  rosuvastatin (CRESTOR) tablet 40 mg (40 mg Oral Given 02/14/22 0939)  tiZANidine (ZANAFLEX) tablet 8 mg (has no administration in time range)  topiramate (TOPAMAX) tablet 200 mg (200 mg Oral Given 02/14/22 0939)  traZODone (DESYREL) tablet 300 mg (has no administration in time range)  acetaminophen (TYLENOL) tablet 650 mg (has no administration in time range)  FLUoxetine (PROZAC) capsule 60 mg (60 mg Oral Given 02/14/22 0939)  furosemide (LASIX) tablet 60 mg (60 mg Oral Given 02/14/22 0939)  insulin aspart (novoLOG) injection 0-9 Units (2 Units Subcutaneous Given 02/14/22 1723)  insulin glargine-yfgn (SEMGLEE) injection 10 Units (has no administration in time range)  vancomycin (VANCOREADY) IVPB 2000 mg/400 mL (0 mg Intravenous Stopped 02/13/22 2300)  iohexol (OMNIPAQUE) 350 MG/ML injection 100 mL (100 mLs Intravenous Contrast Given 02/13/22 2045)  potassium chloride SA (KLOR-CON M) CR tablet 40 mEq (40 mEq Oral Given 02/14/22 0153)  phytonadione (VITAMIN K) tablet 5 mg (5 mg Oral Given 02/14/22 1623)    Mobility walks with person assist Moderate fall risk   Focused Assessments Neuro Assessment Handoff:  Swallow screen pass? Yes  Cardiac Rhythm: Normal sinus rhythm       Neuro Assessment:   Neuro Checks:      Last Documented NIHSS Modified Score:   Has TPA been given? No If patient is a Neuro Trauma and patient is going to OR before floor call report to 4N Charge nurse: [phone] or [phone]   R Recommendations: See Admitting Provider Note  Report given to:   Additional Notes: pt is AAOx4. Pt is on 1L  [place] for comfort. Pt is ambulatory with assistance. Pt has on purewick.

## 2022-02-14 NOTE — ED Notes (Signed)
Patient reports having panic attacks and feeling anxious throughout the night. Requesting her home anxiety medications. MD messaged.

## 2022-02-14 NOTE — Progress Notes (Addendum)
Donna Snow  [id number] DOB: 06/08/1950 DOA: 02/13/2022 PCP: [place]    Brief Narrative:  71 year old with a history of PAF on Coumadin, anxiety, chronic diastolic CHF, DM2, HTN, HLD, obesity, seizure, OSA, and abdominal wall abscess January 2023 who presented to the ER with concern of a recurrent abdominal wall infection at the site of an insulin injection.  CT of the abdomen revealed no evidence of a drainable fluid collection/abscess.  While in the ED she reported an episode of chest pain associated with the strap used during her CT scan.  Consultants:  General Surgery  Goals of Care:  Code Status: Full Code   DVT prophylaxis: SCDs -INR presently 2.7  Interim Hx: Afebrile.  Vital signs stable.  WBC not elevated. No new complaints. Reports feeling weak in general, and has some pain at the site of her wound.  Assessment & Plan:  Abdominal wall cellulitis No evidence of abscess on CT - wound opened somewhat at bedside by Gen Surgery w/ purulent material drained - note made that further exploration at bedside likely indicated but will not while INR elevated - will dose w/ low dose vitamin K and recheck INR in AM  Chest pain History suggest this was related to a strap use during her CT scan -the admitting doctor obtained a history of some exertional dyspnea and chest pain for weeks prior to admission, but she reports no SSCP or anginal type pain on my hx - f/u troponin unremarkable x2 - if EF preserved on pending TTE w/o WMA do not feel further investigation as an inpatient is indicated   Mild hypokalemia Due to poor intake - supplement and follow  Paroxysmal atrial fibrillation Chronically on Coumadin - reverse w/ vitamin K to allow for deeper wound exploration as needed - Cards has suggested changing to eliquis and I agree - resume once clear no further wound exploration will be needed  Chronic diastolic CHF TTE June 123456 noted EF 65-70% with grade 1 DD -  appears well compensated w/o signif edema at time of exam today - f/u TTE pending   DM2 A1c 7.19 May 2021, and 8.1 this admit - monitor CBG closely in setting of acute infection   Anxiety disorder Continue usual home medications  HLD Continue usual home medications  Seizure disorder Continue usual home medications  Obesity   Family Communication: no family present at time of exam  Disposition:  OBS status - possible d/c home in 24-48hrs depending on how wound progresses    Objective: Blood pressure 123/62, pulse 64, temperature 98.2 F (36.8 C), resp. rate 20, SpO2 92 %. No intake or output data in the 24 hours ending 02/14/22 0731 There were no vitals filed for this visit.  Examination: General: No acute respiratory distress Lungs: Clear to auscultation bilaterally without wheezes or crackles Cardiovascular: Regular rate and rhythm without murmur gallop or rub normal S1 and S2 Abdomen: Nontender, nondistended, soft, bowel sounds positive, no rebound, no ascites, no appreciable mass - obese - wound dressed with some fluid staining dressing at time of exam but w/o spreading erythema from wound Extremities: No significant cyanosis, clubbing, or edema bilateral lower extremities  CBC: Recent Labs  Lab 02/13/22 1145 02/14/22 0200  WBC 6.6 5.7  NEUTROABS 4.2  --   HGB 13.5 12.8  HCT 40.3 38.4  MCV 86.5 86.1  PLT 299 123456   Basic Metabolic Panel: Recent Labs  Lab 02/13/22 1145 02/14/22 0200  NA 138 137  K 3.4*  3.4*  CL 106 104  CO2 24 24  GLUCOSE 125* 232*  BUN 8 6*  CREATININE 0.82 0.74  CALCIUM 8.5* 8.2*  MG  --  1.7   GFR: CrCl cannot be calculated (Unknown ideal weight.).   Scheduled Meds:  insulin aspart  0-9 Units Subcutaneous Q4H   Continuous Infusions:  vancomycin       LOS: 1 day   Cherene Altes, MD [facility] Office  [phone] Pager - Text Page per Amion  If 7PM-7AM, please contact night-coverage per  Amion 02/14/2022, 7:31 AM

## 2022-02-14 NOTE — Care Management Obs Status (Signed)
[facility] NOTIFICATION   Patient Details  Name: Donna Snow MRN: [id number] Date of Birth: [birth year]   Medicare Observation Status Notification Given:  Yes    Fuller Mandril, RN 02/14/2022, 12:00 PM

## 2022-02-14 NOTE — Care Management CC44 (Signed)
Condition Code 44 Documentation Completed  Patient Details  Name: Donna Snow MRN: [id number] Date of Birth: [birth year]   Condition Code 44 given:  Yes Patient signature on Condition Code 44 notice:  Yes Documentation of 2 MD's agreement:  Yes Code 44 added to claim:  Yes    Fuller Mandril, RN 02/14/2022, 12:00 PM

## 2022-02-14 NOTE — Consult Note (Signed)
Reason for Consult:ab wall cellulitis Referring Physician: Dr Audie Box is an 71 y.o. female.  HPI: 5 yof with mmp including chf, dm, htn, obesity with history of abdominal wall abscesses that appear to be related to insulin injections.  She noted about 2 weeks ago after injecting insulin it was difficult and she bent needle.  Later noted a bump that drained pus.  Has been on and off since then. No fevers.  She presented to ER . Wbc normal, ct without collectino.   Past Medical History:  Diagnosis Date   Anxiety    CHF (congestive heart failure) (HCC)    Diabetes mellitus    Hypercholesteremia    Hypertension    Obesity    Seizures (HCC)     Past Surgical History:  Procedure Laterality Date   ABDOMINAL HYSTERECTOMY     ABDOMINAL SURGERY     CHOLECYSTECTOMY     JOINT REPLACEMENT      Family History  Adopted: Yes    Social History:  reports that she has never smoked. She has never used smokeless tobacco. She reports that she does not drink alcohol and does not use drugs.  Allergies  Allergies  Allergen Reactions   Latex Other (See Comments)    Welts - cant wear them, but can be touched by someone who wears them.   Penicillins Hives and Itching    Tolerated ceftriaxone 10/2020 " facial swelling"   No current facility-administered medications on file prior to encounter.   Current Outpatient Medications on File Prior to Encounter  Medication Sig Dispense Refill   albuterol (VENTOLIN HFA) 108 (90 Base) MCG/ACT inhaler Inhale 2 puffs into the lungs every 4 (four) hours as needed for shortness of breath.     busPIRone (BUSPAR) 10 MG tablet Take 20 mg by mouth 3 (three) times daily.     ezetimibe (ZETIA) 10 MG tablet Take 1 tablet by mouth daily.     FLUoxetine (PROZAC) 20 MG capsule Take 20 mg by mouth daily. Take in addition to the 40 mg capsule for a total of 60 mg daily.     FLUoxetine (PROZAC) 40 MG capsule Take 40 mg by mouth daily. Take in addition to the 20  mg capsule for a total of 60 mg daily.     fluticasone furoate-vilanterol (BREO ELLIPTA) 100-25 MCG/INH AEPB Inhale 1 puff into the lungs daily as needed (for shortness of breath).     furosemide (LASIX) 40 MG tablet Take 40-60 mg by mouth See admin instructions. Take 40mg  by mouth once daily on Monday, Wednesday, Friday, then take 60mg  all other days     hydrOXYzine (ATARAX) 25 MG tablet Take 25 mg by mouth 3 (three) times daily.     Hyoscyamine Sulfate SL 0.125 MG SUBL Place 1 tablet under the tongue 3 (three) times daily as needed (stomach cramps).     IBU 800 MG tablet Take 1 tablet (800 mg total) by mouth every 8 (eight) hours as needed for moderate pain. 30 tablet 0   insulin regular human CONCENTRATED (HUMULIN R) 500 UNIT/ML injection Inject 15-21 Units into the skin See admin instructions. Sliding scale 3 times daily     loratadine (CLARITIN) 10 MG tablet Take 10 mg by mouth daily.     losartan (COZAAR) 50 MG tablet Take 50 mg by mouth daily.     omeprazole (PRILOSEC) 40 MG capsule Take 40 mg by mouth at bedtime.     phenytoin (DILANTIN) 100 MG ER  capsule Take 400 mg by mouth daily.     prazosin (MINIPRESS) 2 MG capsule Take 2 mg by mouth at bedtime.     rosuvastatin (CRESTOR) 40 MG tablet Take 1 tablet by mouth daily.     Semaglutide,0.25 or 0.5MG /DOS, 2 MG/1.5ML SOPN Inject 0.25 mg into the skin every Friday.     tiZANidine (ZANAFLEX) 4 MG tablet Take 8 mg by mouth at bedtime.     topiramate (TOPAMAX) 100 MG tablet Take 200 mg by mouth 2 (two) times daily.     traZODone (DESYREL) 150 MG tablet Take 300 mg by mouth at bedtime.     warfarin (COUMADIN) 7.5 MG tablet Take 1 tablet (7.5 mg total) by mouth daily at 4 PM. (Patient taking differently: Take 3.75-7.5 mg by mouth See admin instructions. 3.75 mg Tuesday,Thursday,Saturday and Sunday  7.5 mg Monday,Wednesday and Friday) 30 tablet 0   [DISCONTINUED] atorvastatin (LIPITOR) 40 MG tablet Take 40 mg by mouth daily.     [DISCONTINUED]  citalopram (CELEXA) 20 MG tablet Take by mouth. (Patient not taking: Reported on 10/15/2020)     [DISCONTINUED] dicyclomine (BENTYL) 10 MG capsule Take by mouth. (Patient not taking: Reported on 10/15/2020)     [DISCONTINUED] fexofenadine (ALLEGRA) 180 MG tablet Take 180 mg by mouth daily.       [DISCONTINUED] glipiZIDE (GLUCOTROL) 10 MG tablet Take 10 mg by mouth 2 (two) times daily before a meal.       [DISCONTINUED] lisinopril-hydrochlorothiazide (PRINZIDE,ZESTORETIC) 10-12.5 MG per tablet Take 1 tablet by mouth daily.   (Patient not taking: No sig reported)     [DISCONTINUED] sitaGLIPtan-metformin (JANUMET) 50-1000 MG per tablet Take 1 tablet by mouth 2 (two) times daily with a meal.       [DISCONTINUED] zolpidem (AMBIEN) 10 MG tablet Take 10 mg by mouth at bedtime as needed.         Results for orders placed or performed during the hospital encounter of 02/13/22 (from the past 48 hour(s))  CBC with Differential     Status: None   Collection Time: 02/13/22 11:45 AM  Result Value Ref Range   WBC 6.6 4.0 - 10.5 K/uL   RBC 4.66 3.87 - 5.11 MIL/uL   Hemoglobin 13.5 12.0 - 15.0 g/dL   HCT 40.3 36.0 - 46.0 %   MCV 86.5 80.0 - 100.0 fL   MCH 29.0 26.0 - 34.0 pg   MCHC 33.5 30.0 - 36.0 g/dL   RDW 13.3 11.5 - 15.5 %   Platelets 299 150 - 400 K/uL   nRBC 0.0 0.0 - 0.2 %   Neutrophils Relative % 63 %   Neutro Abs 4.2 1.7 - 7.7 K/uL   Lymphocytes Relative 24 %   Lymphs Abs 1.6 0.7 - 4.0 K/uL   Monocytes Relative 8 %   Monocytes Absolute 0.5 0.1 - 1.0 K/uL   Eosinophils Relative 3 %   Eosinophils Absolute 0.2 0.0 - 0.5 K/uL   Basophils Relative 1 %   Basophils Absolute 0.0 0.0 - 0.1 K/uL   Immature Granulocytes 1 %   Abs Immature Granulocytes 0.03 0.00 - 0.07 K/uL    Comment: Performed at [facility], [address], [place], [place] [address]  Basic metabolic panel     Status: Abnormal   Collection Time: 02/13/22 11:45 AM  Result Value Ref Range   Sodium 138 135 - 145 mmol/L    Potassium 3.4 (L) 3.5 - 5.1 mmol/L   Chloride 106 98 - 111 mmol/L  CO2 24 22 - 32 mmol/L   Glucose, Bld 125 (H) 70 - 99 mg/dL    Comment: Glucose reference range applies only to samples taken after fasting for at least 8 hours.   BUN 8 8 - 23 mg/dL   Creatinine, Ser 8.33 0.44 - 1.00 mg/dL   Calcium 8.5 (L) 8.9 - 10.3 mg/dL   GFR, Estimated >58 >25 mL/min    Comment: (NOTE) Calculated using the CKD-EPI Creatinine Equation (2021)    Anion gap 8 5 - 15    Comment: Performed at [facility], [address], [place], [place] [address]  Blood culture (routine x 2)     Status: None (Preliminary result)   Collection Time: 02/13/22  8:16 PM   Specimen: BLOOD RIGHT FOREARM  Result Value Ref Range   Specimen Description BLOOD RIGHT FOREARM    Special Requests      BOTTLES DRAWN AEROBIC AND ANAEROBIC Blood Culture adequate volume   Culture      NO GROWTH < 12 HOURS Performed at [facility], [address], [place], [place] [address]    Report Status PENDING   Blood culture (routine x 2)     Status: None (Preliminary result)   Collection Time: 02/13/22  8:52 PM   Specimen: BLOOD  Result Value Ref Range   Specimen Description BLOOD SITE NOT SPECIFIED    Special Requests      BOTTLES DRAWN AEROBIC AND ANAEROBIC Blood Culture adequate volume   Culture      NO GROWTH < 12 HOURS Performed at [facility], [address] [place], [place] [address]    Report Status PENDING   Protime-INR     Status: Abnormal   Collection Time: 02/13/22  8:53 PM  Result Value Ref Range   Prothrombin Time 25.9 (H) 11.4 - 15.2 seconds   INR 2.4 (H) 0.8 - 1.2    Comment: (NOTE) INR goal varies based on device and disease states. Performed at [facility], [address], [place], [place] [address]   Lactic acid, plasma     Status: None   Collection Time: 02/13/22  8:53 PM  Result Value Ref Range   Lactic Acid, Venous 1.6 0.5 - 1.9 mmol/L    Comment: Performed at [facility], [address], [place], [place] [address]  Troponin I (High Sensitivity)     Status: None   Collection Time: 02/13/22  9:38 PM  Result Value Ref Range   Troponin I (High Sensitivity) 5 <18 ng/L    Comment: (NOTE) Elevated high sensitivity troponin I (hsTnI) values and significant  changes across serial measurements may suggest ACS but many other  chronic and acute conditions are known to elevate hsTnI results.  Refer to the "Links" section for chest pain algorithms and additional  guidance. Performed at [facility], [address] [place], [place] [address]   CBG monitoring, ED     Status: Abnormal   Collection Time: 02/14/22  1:52 AM  Result Value Ref Range   Glucose-Capillary 220 (H) 70 - 99 mg/dL    Comment: Glucose reference range applies only to samples taken after fasting for at least 8 hours.  CBC     Status: None   Collection Time: 02/14/22  2:00 AM  Result Value Ref Range   WBC 5.7 4.0 - 10.5 K/uL   RBC 4.46 3.87 - 5.11 MIL/uL   Hemoglobin 12.8 12.0 - 15.0 g/dL  HCT 38.4 36.0 - 46.0 %   MCV 86.1 80.0 - 100.0 fL   MCH 28.7 26.0 - 34.0 pg   MCHC 33.3 30.0 - 36.0 g/dL   RDW 16.113.2 09.611.5 - 04.515.5 %   Platelets 304 150 - 400 K/uL   nRBC 0.0 0.0 - 0.2 %    Comment: Performed at [facility], [address] [place], [place] [address]  Basic metabolic panel     Status: Abnormal   Collection Time: 02/14/22  2:00 AM  Result Value Ref Range   Sodium 137 135 - 145 mmol/L   Potassium 3.4 (L) 3.5 - 5.1 mmol/L   Chloride 104 98 - 111 mmol/L   CO2 24 22 - 32 mmol/L   Glucose, Bld 232 (H) 70 - 99 mg/dL    Comment: Glucose reference range applies only to samples taken after fasting for at least 8 hours.   BUN 6 (L) 8 - 23 mg/dL   Creatinine, Ser 1.910.74 0.44 - 1.00 mg/dL   Calcium 8.2 (L) 8.9 - 10.3 mg/dL   GFR, Estimated >47>60 >82>60 mL/min    Comment: (NOTE) Calculated using the CKD-EPI Creatinine Equation (2021)    Anion gap 9 5 - 15    Comment: Performed at  [facility], [address] [place], [place] [address]  Magnesium     Status: None   Collection Time: 02/14/22  2:00 AM  Result Value Ref Range   Magnesium 1.7 1.7 - 2.4 mg/dL    Comment: Performed at [facility], [address] [place], [place] [address]  Protime-INR     Status: Abnormal   Collection Time: 02/14/22  2:00 AM  Result Value Ref Range   Prothrombin Time 28.4 (H) 11.4 - 15.2 seconds   INR 2.7 (H) 0.8 - 1.2    Comment: (NOTE) INR goal varies based on device and disease states. Performed at [facility], [address] [place], [place] [address]   Hemoglobin A1c     Status: Abnormal   Collection Time: 02/14/22  2:00 AM  Result Value Ref Range   Hgb A1c MFr Bld 8.1 (H) 4.8 - 5.6 %    Comment: (NOTE) Pre diabetes:          5.7%-6.4%  Diabetes:              >6.4%  Glycemic control for   <7.0% adults with diabetes    Mean Plasma Glucose 185.77 mg/dL    Comment: Performed at [facility], [address] [place], [place] [address]  Brain natriuretic peptide     Status: None   Collection Time: 02/14/22  2:00 AM  Result Value Ref Range   B Natriuretic Peptide 22.0 0.0 - 100.0 pg/mL    Comment: Performed at [facility], [address] [place] [address] [place], [place] [address]  CBG monitoring, ED     Status: Abnormal   Collection Time: 02/14/22  4:05 AM  Result Value Ref Range   Glucose-Capillary 176 (H) 70 - 99 mg/dL    Comment: Glucose reference range applies only to samples taken after fasting for at least 8 hours.   Comment 1 Notify RN    Comment 2 Document in Chart   CBG monitoring, ED     Status: Abnormal   Collection Time: 02/14/22  6:00 AM  Result Value Ref Range   Glucose-Capillary 176 (H) 70 - 99 mg/dL    Comment: Glucose reference range applies  only to samples taken after fasting for at least 8 hours.   Comment 1 Notify RN    Comment 2 Document in Chart     DG Chest 2 View  Result Date: 02/14/2022 CLINICAL DATA:  Shortness of  breath EXAM: CHEST - 2 VIEW COMPARISON:  10/15/2020 FINDINGS: Cardiac shadow is enlarged but stable. The lungs are well aerated bilaterally. Prominent central vascularity is again seen and stable. No edema is noted. No focal infiltrate is seen. Degenerative changes of the thoracic spine are noted. IMPRESSION: No acute abnormality noted. Electronically Signed   By: Alcide Clever M.D.   On: 02/14/2022 00:44   CT ABDOMEN PELVIS W CONTRAST  Result Date: 02/13/2022 CLINICAL DATA:  Abdominal infection EXAM: CT ABDOMEN AND PELVIS WITH CONTRAST TECHNIQUE: Multidetector CT imaging of the abdomen and pelvis was performed using the standard protocol following bolus administration of intravenous contrast. RADIATION DOSE REDUCTION: This exam was performed according to the departmental dose-optimization program which includes automated exposure control, adjustment of the mA and/or kV according to patient size and/or use of iterative reconstruction technique. CONTRAST:  OMNIPAQUE IOHEXOL 350 MG/ML SOLN COMPARISON:  06/03/2021 FINDINGS: Lower chest: Lung bases are clear. Hepatobiliary: Liver is within normal limits. Status post cholecystectomy. No intrahepatic or extrahepatic ductal dilatation. Pancreas: Within normal limits. Spleen: Within normal limits. Adrenals/Urinary Tract: Adrenal glands are within normal limits. Kidneys are within normal limits.  No hydronephrosis. Bladder is underdistended with history contrast, within normal limits. Stomach/Bowel: Stomach is notable for a tiny hiatal hernia. No evidence of bowel obstruction. Appendix is not discretely visualized. No colonic wall thickening or inflammatory changes. Vascular/Lymphatic: No evidence of abdominal aortic aneurysm. No suspicious abdominopelvic lymphadenopathy. Reproductive: Status post hysterectomy. No adnexal masses. Other: No abdominopelvic ascites. Musculoskeletal: Mild subcutaneous stranding beneath the right mid anterior abdominal wall (series  3/image 40), possibly a subcutaneous injection site. Mild stranding along the lower anterior abdominal wall (series 8/image 30), chronic, favored to be postsurgical. No drainable fluid collection/abscess. Degenerative changes of the visualized thoracolumbar spine. IMPRESSION: No drainable fluid collection/abscess. No acute findings in the abdomen/pelvis. Electronically Signed   By: Charline Bills M.D.   On: 02/13/2022 20:54    Review of Systems  Constitutional:  Negative for fever.  Psychiatric/Behavioral:  The patient is nervous/anxious.   All other systems reviewed and are negative.  Blood pressure (!) 140/63, pulse 67, temperature 98.1 F (36.7 C), temperature source Oral, resp. rate 18, height 5\' 3"  (1.6 m), weight (!) 146.5 kg, SpO2 92 %. Physical Exam Constitutional:      Appearance: Normal appearance. She is obese.  Cardiovascular:     Rate and Rhythm: Normal rate. Rhythm irregular.  Pulmonary:     Effort: Pulmonary effort is normal.  Abdominal:     Palpations: Abdomen is soft.       Comments: Circle is superficial abscess draining purulence  Neurological:     Mental Status: She is alert.     Assessment/Plan: Skin abscess with cellulitis - I opened some of this at bedside and got purulence out.   -abx -once INR normal if not completely drained could open this more at bedside.  INR 2.7 so not really an option now -fine if she has a diet   I reviewed ED provider notes, hospitalist notes, last 24 h vitals and pain scores, last 24 h labs and trends, and last 24 h imaging results.  This care required straight-forward level of medical decision making.    02/14/2022, 11:21  AM

## 2022-02-14 NOTE — ED Notes (Signed)
Dressing changed.

## 2022-02-14 NOTE — ED Notes (Signed)
Patient transported to X-ray 

## 2022-02-15 DIAGNOSIS — L03311 Cellulitis of abdominal wall: Secondary | ICD-10-CM | POA: Diagnosis not present

## 2022-02-15 LAB — CBC
HCT: 37.8 % (ref 36.0–46.0)
Hemoglobin: 12.6 g/dL (ref 12.0–15.0)
MCH: 28.8 pg (ref 26.0–34.0)
MCHC: 33.3 g/dL (ref 30.0–36.0)
MCV: 86.5 fL (ref 80.0–100.0)
Platelets: 310 10*3/uL (ref 150–400)
RBC: 4.37 MIL/uL (ref 3.87–5.11)
RDW: 13.2 % (ref 11.5–15.5)
WBC: 6.6 10*3/uL (ref 4.0–10.5)
nRBC: 0 % (ref 0.0–0.2)

## 2022-02-15 LAB — MAGNESIUM: Magnesium: 1.8 mg/dL (ref 1.7–2.4)

## 2022-02-15 LAB — BASIC METABOLIC PANEL
Anion gap: 10 (ref 5–15)
BUN: 7 mg/dL — ABNORMAL LOW (ref 8–23)
CO2: 27 mmol/L (ref 22–32)
Calcium: 8.5 mg/dL — ABNORMAL LOW (ref 8.9–10.3)
Chloride: 103 mmol/L (ref 98–111)
Creatinine, Ser: 0.87 mg/dL (ref 0.44–1.00)
GFR, Estimated: >60 mL/min (ref >60)
Glucose, Bld: 230 mg/dL — ABNORMAL HIGH (ref 70–99)
Potassium: 3.6 mmol/L (ref 3.5–5.1)
Sodium: 140 mmol/L (ref 135–145)

## 2022-02-15 LAB — GLUCOSE, CAPILLARY
Glucose-Capillary: 243 mg/dL — ABNORMAL HIGH (ref 70–99)
Glucose-Capillary: 277 mg/dL — ABNORMAL HIGH (ref 70–99)
Glucose-Capillary: 272 mg/dL — ABNORMAL HIGH (ref 70–99)
Glucose-Capillary: 296 mg/dL — ABNORMAL HIGH (ref 70–99)

## 2022-02-15 LAB — MRSA NEXT GEN BY PCR, NASAL: MRSA by PCR Next Gen: NOT DETECTED

## 2022-02-15 LAB — PROTIME-INR
INR: 2.3 — ABNORMAL HIGH (ref 0.8–1.2)
Prothrombin Time: 25.3 seconds — ABNORMAL HIGH (ref 11.4–15.2)

## 2022-02-15 MED ORDER — INSULIN REGULAR HUMAN (CONC) 500 UNIT/ML ~~LOC~~ SOPN
15.0000 [IU] | PEN_INJECTOR | Freq: Three times a day (TID) | SUBCUTANEOUS | Status: DC
  Administered 2022-02-15 – 2022-02-16 (×2): 15 [IU] via SUBCUTANEOUS
  Filled 2022-02-15: qty 3

## 2022-02-15 NOTE — Progress Notes (Signed)
  Transition of Care [facility]) Screening Note   Patient Details  Name: Donna Snow Date of Birth: Oct 01, 1950   Transition of Care [facility]) CM/SW Contact:    Bartholomew Crews, RN Phone Number: [phone] 02/15/2022, 2:23 PM    Transition of Care Department [facility]) has reviewed patient and no TOC needs have been identified at this time. We will continue to monitor patient advancement through interdisciplinary progression rounds. If new patient transition needs arise, please place a TOC consult.

## 2022-02-15 NOTE — Progress Notes (Signed)
PROGRESS NOTE  Donna Snow  CLE:[id number] DOB: 1950/11/26 DOA: 02/13/2022 PCP: [facility]   Brief Narrative:  Patient is a 71 year old female with history of paroxysmal A-fib on Coumadin, anxiety, chronic diastolic CHF, diabetes type 2, hypertension, hyperlipidemia, obesity, seizure, OSA, abdominal wall abscess who presented to the emergency room with complaint of recurrent abdominal wall infection at the site of insulin injection.  CT abdomen showed no evidence of drainable fluid collection/abscess.  General surgery is also following with possibility of need of I&D.  Currently being managed with IV antibiotics.  Assessment & Plan:  Principal Problem:   Abdominal wall cellulitis Active Problems:   Abdominal wall abscess   Essential hypertension   Hyperlipidemia   Type 2 diabetes mellitus (HCC)   AF (paroxysmal atrial fibrillation) (HCC)   Anxiety state   Chronic diastolic CHF (congestive heart failure) (HCC)   Chest pain   Cellulitis of abdominal wall   Abdominal wall cellulitis: No evidence of abscess or fluid on CT.  Wound check by general surgery with purulent material noted.  Currently being observed with antibiotics with +/- need of I&D.  Allowing INR to lower. On vancomycin  Chest pain: Troponins have been negative.  No wall motion abnormality in the echo.  Chest pain has resolved.  Hypokalemia: Supplemented and corrected  Paroxysmal A-fib: On Coumadin for anticoagulation, now on hold for possible need of surgery.  Plan is to change to Eliquis as per cardiology recommendation on discharge.  Chronic diastolic CHF: Echo done here showed EF of 55 to 60%, grade 1 diastolic dysfunction.  Currently euvolemic.  Continue Lasix as per home dose  Diabetes type 2: Recent A1c of 8.1, continue current insulin regimen.  Diabetic coordinator following  Anxiety disorder: continue home medications: buspirone, Prozac, hydroxyzine  Hypertension: Currently normotensive.   Continue losartan  Hyperlipidemia: Continue home medication Crestor  Seizure disorder: On Dilantin  Morbid obesity: BMI 57.22          DVT prophylaxis:None     Code Status: Full Code  Family Communication:   Patient status:Obs  Patient is from :Home  Anticipated discharge BS:[facility]  Estimated DC date:2-3 days   Consultants: Surgery  Procedures:None  Antimicrobials:  Anti-infectives (From admission, onward)    Start     Dose/Rate Route Frequency Ordered Stop   02/14/22 2100  vancomycin (VANCOREADY) IVPB 1750 mg/350 mL        1,750 mg 175 mL/hr over 120 Minutes Intravenous Every 24 hours 02/13/22 2300     02/13/22 2015  vancomycin (VANCOREADY) IVPB 2000 mg/400 mL        2,000 mg 200 mL/hr over 120 Minutes Intravenous  Once 02/13/22 2007 02/13/22 2300       Subjective: Patient seen and examined at bedside this afternoon.  Hemodynamically stable.  Lying in bed.  Overall comfortable.  Complains of some pain on the abdomen cellulitis site  Objective: Vitals:   02/14/22 2000 02/14/22 2152 02/15/22 0515 02/15/22 0814  BP: 111/66 (!) 130/50 (!) 111/56 (!) 125/59  Pulse: 79 70 61 70  Resp: 13 18 18 18   Temp:  99 F (37.2 C) 97.9 F (36.6 C) 97.9 F (36.6 C)  TempSrc:  Oral Oral Oral  SpO2: 98% 96% 97% 100%  Weight:      Height:        Intake/Output Summary (Last 24 hours) at 02/15/2022 1448 Last data filed at 02/15/2022 1058 Gross per 24 hour  Intake 767.25 ml  Output 250 ml  Net 517.25  ml   Filed Weights   02/14/22 0737  Weight: (!) 146.5 kg    Examination:  General exam: Overall comfortable, not in distress HEENT: PERRL Respiratory system:  no wheezes or crackles  Cardiovascular system: S1 & S2 heard, RRR.  Gastrointestinal system: Abdomen is nondistended, soft and nontender.  Cellulitic area on the periumbilical region with overlying dressing Central nervous system: Alert and oriented Extremities: No edema, no clubbing ,no cyanosis Skin: No  rashes, no ulcers,no icterus     Data Reviewed: I have personally reviewed following labs and imaging studies  CBC: Recent Labs  Lab 02/13/22 1145 02/14/22 0200 02/15/22 0253  WBC 6.6 5.7 6.6  NEUTROABS 4.2  --   --   HGB 13.5 12.8 12.6  HCT 40.3 38.4 37.8  MCV 86.5 86.1 86.5  PLT 299 304 310   Basic Metabolic Panel: Recent Labs  Lab 02/13/22 1145 02/14/22 0200 02/15/22 0253  NA 138 137 140  K 3.4* 3.4* 3.6  CL 106 104 103  CO2 24 24 27   GLUCOSE 125* 232* 230*  BUN 8 6* 7*  CREATININE 0.82 0.74 0.87  CALCIUM 8.5* 8.2* 8.5*  MG  --  1.7 1.8     Recent Results (from the past 240 hour(s))  Blood culture (routine x 2)     Status: None (Preliminary result)   Collection Time: 02/13/22  8:16 PM   Specimen: BLOOD RIGHT FOREARM  Result Value Ref Range Status   Specimen Description BLOOD RIGHT FOREARM  Final   Special Requests   Final    BOTTLES DRAWN AEROBIC AND ANAEROBIC Blood Culture adequate volume   Culture   Final    NO GROWTH 2 DAYS Performed at [facility], [address], [place], [place]    Report Status PENDING  Incomplete  Blood culture (routine x 2)     Status: None (Preliminary result)   Collection Time: 02/13/22  8:52 PM   Specimen: BLOOD  Result Value Ref Range Status   Specimen Description BLOOD SITE NOT SPECIFIED  Final   Special Requests   Final    BOTTLES DRAWN AEROBIC AND ANAEROBIC Blood Culture adequate volume   Culture   Final    NO GROWTH 2 DAYS Performed at [facility], [address], [place], [place]    Report Status PENDING  Incomplete  MRSA Next Gen by PCR, Nasal     Status: None   Collection Time: 02/15/22  5:29 AM   Specimen: Nasal Mucosa; Nasal Swab  Result Value Ref Range Status   MRSA by PCR Next Gen NOT DETECTED NOT DETECTED Final    Comment: (NOTE) The GeneXpert MRSA Assay (FDA approved for NASAL specimens only), is one component of a comprehensive MRSA colonization surveillance program.  It is not intended to diagnose MRSA infection nor to guide or monitor treatment for MRSA infections. Test performance is not FDA approved in patients less than 4 years old. Performed at [facility], [address] [place], [place]      Radiology Studies: ECHOCARDIOGRAM COMPLETE  Result Date: 02/14/2022    ECHOCARDIOGRAM REPORT   Patient Name:   ELFRIEDE BONINI Date of Exam: 02/14/2022 Medical Rec #:  04/16/2022    Height:       63.0 in Accession #:    [id number]   Weight:       323.0 lb Date of Birth:  [birth year]    BSA:  2.371 m Patient Age:    38 years     BP:           140/63 mmHg Patient Gender: F            HR:           70 bpm. Exam Location:  Inpatient Procedure: 2D Echo, Cardiac Doppler and Color Doppler Indications:    Chest pain R07.9  History:        Patient has prior history of Echocardiogram examinations, most                 recent 10/16/2020. Angina, Arrythmias:Atrial Fibrillation; Risk                 Factors:Hypertension, Sleep Apnea, Diabetes and Dyslipidemia.  Sonographer:    Lucendia Herrlich Referring Phys: [id number] YQMVHQION RATHORE  Sonographer Comments: Exam terminated per patient request IMPRESSIONS  1. Left ventricular ejection fraction, by estimation, is 55 to 60%. The left ventricle has normal function. Left ventricular endocardial border not optimally defined to evaluate regional wall motion. The left ventricular internal cavity size was mildly dilated. Left ventricular diastolic parameters are consistent with Grade I diastolic dysfunction (impaired relaxation). Elevated left atrial pressure.  2. Right ventricular systolic function is normal. The right ventricular size is normal. Tricuspid regurgitation signal is inadequate for assessing PA pressure.  3. Left atrial size was severely dilated.  4. The mitral valve is normal in structure. Trivial mitral valve regurgitation.  5. The aortic valve is tricuspid. There is mild calcification of the aortic valve.  Aortic valve regurgitation is not visualized. Aortic valve sclerosis is present, with no evidence of aortic valve stenosis. Comparison(s): No significant change from prior study. Prior images reviewed side by side. FINDINGS  Left Ventricle: Images are not adequate to evaluate regional wall motion. The patient refused administration of Definity contrast. Left ventricular ejection fraction, by estimation, is 55 to 60%. The left ventricle has normal function. Left ventricular endocardial border not optimally defined to evaluate regional wall motion. The left ventricular internal cavity size was mildly dilated. There is no left ventricular hypertrophy. Left ventricular diastolic parameters are consistent with Grade I diastolic  dysfunction (impaired relaxation). Elevated left atrial pressure. Right Ventricle: The right ventricular size is normal. Right vetricular wall thickness was not well visualized. Right ventricular systolic function is normal. Tricuspid regurgitation signal is inadequate for assessing PA pressure. Left Atrium: Left atrial size was severely dilated. Right Atrium: Right atrial size was normal in size. Pericardium: There is no evidence of pericardial effusion. Mitral Valve: The mitral valve is normal in structure. Trivial mitral valve regurgitation. Tricuspid Valve: The tricuspid valve is grossly normal. Tricuspid valve regurgitation is not demonstrated. Aortic Valve: The aortic valve is tricuspid. There is mild calcification of the aortic valve. Aortic valve regurgitation is not visualized. Aortic valve sclerosis is present, with no evidence of aortic valve stenosis. Aortic valve mean gradient measures 6.0 mmHg. Aortic valve peak gradient measures 10.5 mmHg. Aortic valve area, by VTI measures 2.64 cm. Pulmonic Valve: The pulmonic valve was not well visualized. Pulmonic valve regurgitation is not visualized. Aorta: The aortic root is normal in size and structure. IAS/Shunts: No atrial level shunt  detected by color flow Doppler.  LEFT VENTRICLE PLAX 2D LVIDd:         5.80 cm   Diastology LVIDs:         3.90 cm   LV e' medial:    7.18 cm/s LV PW:  0.77 cm   LV E/e' medial:  13.8 LV IVS:        1.13 cm   LV e' lateral:   9.03 cm/s LVOT diam:     2.20 cm   LV E/e' lateral: 11.0 LV SV:         89 LV SV Index:   37 LVOT Area:     3.80 cm  LEFT ATRIUM           Index        RIGHT ATRIUM           Index LA diam:      5.40 cm 2.28 cm/m   RA Area:     15.70 cm LA Vol (A4C): 67.4 ml 28.43 ml/m  RA Volume:   41.10 ml  17.34 ml/m  AORTIC VALVE AV Area (Vmax):    2.42 cm AV Area (Vmean):   2.44 cm AV Area (VTI):     2.64 cm AV Vmax:           162.00 cm/s AV Vmean:          108.000 cm/s AV VTI:            0.335 m AV Peak Grad:      10.5 mmHg AV Mean Grad:      6.0 mmHg LVOT Vmax:         103.00 cm/s LVOT Vmean:        69.200 cm/s LVOT VTI:          0.233 m LVOT/AV VTI ratio: 0.70  AORTA Ao Root diam: 3.30 cm Ao Asc diam:  2.90 cm MITRAL VALVE MV Area (PHT): 4.49 cm     SHUNTS MV Decel Time: 169 msec     Systemic VTI:  0.23 m MV E velocity: 99.00 cm/s   Systemic Diam: 2.20 cm MV A velocity: 106.00 cm/s MV E/A ratio:  0.93 Mihai Croitoru MD Electronically signed by Sanda Klein MD Signature Date/Time: 02/14/2022/2:06:09 PM    Final    DG Chest 2 View  Result Date: 02/14/2022 CLINICAL DATA:  Shortness of breath EXAM: CHEST - 2 VIEW COMPARISON:  10/15/2020 FINDINGS: Cardiac shadow is enlarged but stable. The lungs are well aerated bilaterally. Prominent central vascularity is again seen and stable. No edema is noted. No focal infiltrate is seen. Degenerative changes of the thoracic spine are noted. IMPRESSION: No acute abnormality noted. Electronically Signed   By: Inez Catalina M.D.   On: 02/14/2022 00:44   CT ABDOMEN PELVIS W CONTRAST  Result Date: 02/13/2022 CLINICAL DATA:  Abdominal infection EXAM: CT ABDOMEN AND PELVIS WITH CONTRAST TECHNIQUE: Multidetector CT imaging of the abdomen and pelvis was  performed using the standard protocol following bolus administration of intravenous contrast. RADIATION DOSE REDUCTION: This exam was performed according to the departmental dose-optimization program which includes automated exposure control, adjustment of the mA and/or kV according to patient size and/or use of iterative reconstruction technique. CONTRAST:  133mL OMNIPAQUE IOHEXOL 350 MG/ML SOLN COMPARISON:  06/03/2021 FINDINGS: Lower chest: Lung bases are clear. Hepatobiliary: Liver is within normal limits. Status post cholecystectomy. No intrahepatic or extrahepatic ductal dilatation. Pancreas: Within normal limits. Spleen: Within normal limits. Adrenals/Urinary Tract: Adrenal glands are within normal limits. Kidneys are within normal limits.  No hydronephrosis. Bladder is underdistended with history contrast, within normal limits. Stomach/Bowel: Stomach is notable for a tiny hiatal hernia. No evidence of bowel obstruction. Appendix is not discretely visualized. No colonic wall thickening or inflammatory changes. Vascular/Lymphatic:  No evidence of abdominal aortic aneurysm. No suspicious abdominopelvic lymphadenopathy. Reproductive: Status post hysterectomy. No adnexal masses. Other: No abdominopelvic ascites. Musculoskeletal: Mild subcutaneous stranding beneath the right mid anterior abdominal wall (series 3/image 40), possibly a subcutaneous injection site. Mild stranding along the lower anterior abdominal wall (series 8/image 30), chronic, favored to be postsurgical. No drainable fluid collection/abscess. Degenerative changes of the visualized thoracolumbar spine. IMPRESSION: No drainable fluid collection/abscess. No acute findings in the abdomen/pelvis. Electronically Signed   By: Charline BillsSriyesh  Krishnan M.D.   On: 02/13/2022 20:54    Scheduled Meds:  busPIRone  20 mg Oral TID   ezetimibe  10 mg Oral Daily   FLUoxetine  60 mg Oral Daily   fluticasone furoate-vilanterol  1 puff Inhalation Daily   furosemide   40 mg Oral Q M,W,F   furosemide  60 mg Oral Q T,Th,S,Su   hydrOXYzine  25 mg Oral TID   insulin aspart  0-9 Units Subcutaneous TID WC   insulin glargine-yfgn  10 Units Subcutaneous QHS   loratadine  10 mg Oral Daily   losartan  50 mg Oral Daily   pantoprazole  40 mg Oral Daily   phenytoin  400 mg Oral Daily   prazosin  2 mg Oral QHS   rosuvastatin  40 mg Oral Daily   tiZANidine  8 mg Oral QHS   topiramate  200 mg Oral BID   traZODone  300 mg Oral QHS   Continuous Infusions:  vancomycin Stopped (02/15/22 0208)     LOS: 1 day   Burnadette PopAmrit Mekiyah Gladwell, MD [facility] P10/08/2021, 2:48 PM

## 2022-02-15 NOTE — Progress Notes (Signed)
Subjective: CC: Pain over abdominal wall stable. Feels anxious. Afebrile overnight.   Objective: Vital signs in last 24 hours: Temp:  [97.4 F (36.3 C)-99.3 F (37.4 C)] 97.9 F (36.6 C) (10/04 0814) Pulse Rate:  [61-79] 70 (10/04 0814) Resp:  [13-23] 18 (10/04 0814) BP: (111-151)/(50-81) 125/59 (10/04 0814) SpO2:  [96 %-100 %] 100 % (10/04 0814)    Intake/Output from previous day: 10/03 0701 - 10/04 0700 In: 527.3 [P.O.:180; IV Piggyback:347.3] Out: 450 [Urine:450] Intake/Output this shift: Total I/O In: 240 [P.O.:240] Out: -   PE: Gen:  Alert, NAD, pleasant Abd: Soft, ND, +BS. Abdomen NT except over wound. Wound over the right upper abdomen is circular, measuring ~1.5 x 1cm, with purulent drainage on the dressing but no current active drainage. There is granulation tissue at the base of the wound and it is superficial. There is about 5cm of induration in greatest diameter surrounding the wound with faint erythema and warmth overlying. With patients permission the wound was probed with a sterile cotton swab and was able to reopen superiorly into small cavity that drained bloody/purulent drainage. This was packed with iodoform gauze.   Lab Results:  Recent Labs    02/14/22 0200 02/15/22 0253  WBC 5.7 6.6  HGB 12.8 12.6  HCT 38.4 37.8  PLT 304 310   BMET Recent Labs    02/14/22 0200 02/15/22 0253  NA 137 140  K 3.4* 3.6  CL 104 103  CO2 24 27  GLUCOSE 232* 230*  BUN 6* 7*  CREATININE 0.74 0.87  CALCIUM 8.2* 8.5*   PT/INR Recent Labs    02/14/22 0200 02/15/22 0253  LABPROT 28.4* 25.3*  INR 2.7* 2.3*   CMP     Component Value Date/Time   NA 140 02/15/2022 0253   K 3.6 02/15/2022 0253   CL 103 02/15/2022 0253   CO2 27 02/15/2022 0253   GLUCOSE 230 (H) 02/15/2022 0253   BUN 7 (L) 02/15/2022 0253   CREATININE 0.87 02/15/2022 0253   CALCIUM 8.5 (L) 02/15/2022 0253   PROT 6.8 06/03/2021 0326   ALBUMIN 2.7 (L) 06/03/2021 0326   AST 15  06/03/2021 0326   ALT 20 06/03/2021 0326   ALKPHOS 127 (H) 06/03/2021 0326   BILITOT 0.7 06/03/2021 0326   GFRNONAA >60 02/15/2022 0253   GFRAA >60 01/05/2016 2234   Lipase     Component Value Date/Time   LIPASE 23 11/21/2014 1743    Studies/Results: ECHOCARDIOGRAM COMPLETE  Result Date: 02/14/2022    ECHOCARDIOGRAM REPORT   Patient Name:   Donna Snow Date of Exam: 02/14/2022 Medical Rec #:  [id number]    Height:       63.0 in Accession #:    [id number]   Weight:       323.0 lb Date of Birth:  [birth year]    BSA:          2.371 m Patient Age:    71 years     BP:           140/63 mmHg Patient Gender: F            HR:           70 bpm. Exam Location:  Inpatient Procedure: 2D Echo, Cardiac Doppler and Color Doppler Indications:    Chest pain R07.9  History:        Patient has prior history of Echocardiogram examinations, most  recent 10/16/2020. Angina, Arrythmias:Atrial Fibrillation; Risk                 Factors:Hypertension, Sleep Apnea, Diabetes and Dyslipidemia.  Sonographer:    Lucendia Herrlich Referring Phys: [id number] [id number] RATHORE  Sonographer Comments: Exam terminated per patient request IMPRESSIONS  1. Left ventricular ejection fraction, by estimation, is 55 to 60%. The left ventricle has normal function. Left ventricular endocardial border not optimally defined to evaluate regional wall motion. The left ventricular internal cavity size was mildly dilated. Left ventricular diastolic parameters are consistent with Grade I diastolic dysfunction (impaired relaxation). Elevated left atrial pressure.  2. Right ventricular systolic function is normal. The right ventricular size is normal. Tricuspid regurgitation signal is inadequate for assessing PA pressure.  3. Left atrial size was severely dilated.  4. The mitral valve is normal in structure. Trivial mitral valve regurgitation.  5. The aortic valve is tricuspid. There is mild calcification of the aortic valve. Aortic valve  regurgitation is not visualized. Aortic valve sclerosis is present, with no evidence of aortic valve stenosis. Comparison(s): No significant change from prior study. Prior images reviewed side by side. FINDINGS  Left Ventricle: Images are not adequate to evaluate regional wall motion. The patient refused administration of Definity contrast. Left ventricular ejection fraction, by estimation, is 55 to 60%. The left ventricle has normal function. Left ventricular endocardial border not optimally defined to evaluate regional wall motion. The left ventricular internal cavity size was mildly dilated. There is no left ventricular hypertrophy. Left ventricular diastolic parameters are consistent with Grade I diastolic  dysfunction (impaired relaxation). Elevated left atrial pressure. Right Ventricle: The right ventricular size is normal. Right vetricular wall thickness was not well visualized. Right ventricular systolic function is normal. Tricuspid regurgitation signal is inadequate for assessing PA pressure. Left Atrium: Left atrial size was severely dilated. Right Atrium: Right atrial size was normal in size. Pericardium: There is no evidence of pericardial effusion. Mitral Valve: The mitral valve is normal in structure. Trivial mitral valve regurgitation. Tricuspid Valve: The tricuspid valve is grossly normal. Tricuspid valve regurgitation is not demonstrated. Aortic Valve: The aortic valve is tricuspid. There is mild calcification of the aortic valve. Aortic valve regurgitation is not visualized. Aortic valve sclerosis is present, with no evidence of aortic valve stenosis. Aortic valve mean gradient measures 6.0 mmHg. Aortic valve peak gradient measures 10.5 mmHg. Aortic valve area, by VTI measures 2.64 cm. Pulmonic Valve: The pulmonic valve was not well visualized. Pulmonic valve regurgitation is not visualized. Aorta: The aortic root is normal in size and structure. IAS/Shunts: No atrial level shunt detected by  color flow Doppler.  LEFT VENTRICLE PLAX 2D LVIDd:         5.80 cm   Diastology LVIDs:         3.90 cm   LV e' medial:    7.18 cm/s LV PW:         0.77 cm   LV E/e' medial:  13.8 LV IVS:        1.13 cm   LV e' lateral:   9.03 cm/s LVOT diam:     2.20 cm   LV E/e' lateral: 11.0 LV SV:         89 LV SV Index:   37 LVOT Area:     3.80 cm  LEFT ATRIUM           Index        RIGHT ATRIUM  Index LA diam:      5.40 cm 2.28 cm/m   RA Area:     15.70 cm LA Vol (A4C): 67.4 ml 28.43 ml/m  RA Volume:   41.10 ml  17.34 ml/m  AORTIC VALVE AV Area (Vmax):    2.42 cm AV Area (Vmean):   2.44 cm AV Area (VTI):     2.64 cm AV Vmax:           162.00 cm/s AV Vmean:          108.000 cm/s AV VTI:            0.335 m AV Peak Grad:      10.5 mmHg AV Mean Grad:      6.0 mmHg LVOT Vmax:         103.00 cm/s LVOT Vmean:        69.200 cm/s LVOT VTI:          0.233 m LVOT/AV VTI ratio: 0.70  AORTA Ao Root diam: 3.30 cm Ao Asc diam:  2.90 cm MITRAL VALVE MV Area (PHT): 4.49 cm     SHUNTS MV Decel Time: 169 msec     Systemic VTI:  0.23 m MV E velocity: 99.00 cm/s   Systemic Diam: 2.20 cm MV A velocity: 106.00 cm/s MV E/A ratio:  0.93 Mihai Croitoru MD Electronically signed by Thurmon Fair MD Signature Date/Time: 02/14/2022/2:06:09 PM    Final    DG Chest 2 View  Result Date: 02/14/2022 CLINICAL DATA:  Shortness of breath EXAM: CHEST - 2 VIEW COMPARISON:  10/15/2020 FINDINGS: Cardiac shadow is enlarged but stable. The lungs are well aerated bilaterally. Prominent central vascularity is again seen and stable. No edema is noted. No focal infiltrate is seen. Degenerative changes of the thoracic spine are noted. IMPRESSION: No acute abnormality noted. Electronically Signed   By: Alcide Clever M.D.   On: 02/14/2022 00:44   CT ABDOMEN PELVIS W CONTRAST  Result Date: 02/13/2022 CLINICAL DATA:  Abdominal infection EXAM: CT ABDOMEN AND PELVIS WITH CONTRAST TECHNIQUE: Multidetector CT imaging of the abdomen and pelvis was performed  using the standard protocol following bolus administration of intravenous contrast. RADIATION DOSE REDUCTION: This exam was performed according to the departmental dose-optimization program which includes automated exposure control, adjustment of the mA and/or kV according to patient size and/or use of iterative reconstruction technique. CONTRAST:  OMNIPAQUE IOHEXOL 350 MG/ML SOLN COMPARISON:  06/03/2021 FINDINGS: Lower chest: Lung bases are clear. Hepatobiliary: Liver is within normal limits. Status post cholecystectomy. No intrahepatic or extrahepatic ductal dilatation. Pancreas: Within normal limits. Spleen: Within normal limits. Adrenals/Urinary Tract: Adrenal glands are within normal limits. Kidneys are within normal limits.  No hydronephrosis. Bladder is underdistended with history contrast, within normal limits. Stomach/Bowel: Stomach is notable for a tiny hiatal hernia. No evidence of bowel obstruction. Appendix is not discretely visualized. No colonic wall thickening or inflammatory changes. Vascular/Lymphatic: No evidence of abdominal aortic aneurysm. No suspicious abdominopelvic lymphadenopathy. Reproductive: Status post hysterectomy. No adnexal masses. Other: No abdominopelvic ascites. Musculoskeletal: Mild subcutaneous stranding beneath the right mid anterior abdominal wall (series 3/image 40), possibly a subcutaneous injection site. Mild stranding along the lower anterior abdominal wall (series 8/image 30), chronic, favored to be postsurgical. No drainable fluid collection/abscess. Degenerative changes of the visualized thoracolumbar spine. IMPRESSION: No drainable fluid collection/abscess. No acute findings in the abdomen/pelvis. Electronically Signed   By: Charline Bills M.D.   On: 02/13/2022 20:54    Anti-infectives: Anti-infectives (From admission, onward)  Start     Dose/Rate Route Frequency Ordered Stop   02/14/22 2100  vancomycin (VANCOREADY) IVPB 1750 mg/350 mL        1,750  mg 175 mL/hr over 120 Minutes Intravenous Every 24 hours 02/13/22 2300     02/13/22 2015  vancomycin (VANCOREADY) IVPB 2000 mg/400 mL        2,000 mg 200 mL/hr over 120 Minutes Intravenous  Once 02/13/22 2007 02/13/22 2300        Assessment/Plan Skin abscess with cellulitis - Re-opened at bedside today as noted above. Packed with iodoform gauze to keep it open, plan to change this daily. I do not feel any further areas of fluctuance/undrained fluid collections on exam. Hopefully can avoid any additional procedures her INR is still elevated at 2.3 today. She does not appear ill from this - afebrile without tachycardia or systolic hypotension, wbc wnl and lactic wnl. We will continue to monitor.   FEN - CM VTE - SCDs, INR 2.3 ID - Vanc   LOS: 1 day    Jacinto Halim , [facility] 02/15/2022, 11:12 AM Please see Amion for pager number during day hours 7:00am-4:30pm

## 2022-02-15 NOTE — Inpatient Diabetes Management (Signed)
Inpatient Diabetes Program Recommendations  AACE/ADA: New Consensus Statement on Inpatient Glycemic Control (2015)  Target Ranges:  Prepandial:   less than 140 mg/dL      Peak postprandial:   less than 180 mg/dL (1-2 hours)      Critically ill patients:  140 - 180 mg/dL   Lab Results  Component Value Date   GLUCAP 277 (H) 02/15/2022   HGBA1C 8.1 (H) 02/14/2022    Review of Glycemic Control  Diabetes history: DM2 Outpatient Diabetes medications: U-500 25 units TID, Ozempic 0.25 mg weekly Current orders for Inpatient glycemic control: Semglee 10 units QHS, Novolog 0-9 units TID  HgbA1C - 8.1% Endo - Donna Planas, MD  Inpatient Diabetes Program Recommendations:    D/C Semglee 10 QHS Add U-500 15 units TID Continue Novolog 0-9 TID  Spoke with pt by phone regarding her U-500 home insulin. Pt requesting to switch to U-500 15 units TID. States her blood sugars are never going to be controlled with Semglee. Prefers to stay on U-500, but lower dose her in hospital than her usual 25 units TID. Secure text sent to MD. Pt has f/u OV with Dr. Tamala Snow on 03/07/2022 at 11 am.   Will continue to follow while inpatient.  Thank you. Donna Snow, RD, LDN, [place] Inpatient Diabetes Coordinator [phone]

## 2022-02-16 DIAGNOSIS — L03311 Cellulitis of abdominal wall: Secondary | ICD-10-CM | POA: Diagnosis not present

## 2022-02-16 LAB — GLUCOSE, CAPILLARY
Glucose-Capillary: 319 mg/dL — ABNORMAL HIGH (ref 70–99)
Glucose-Capillary: 321 mg/dL — ABNORMAL HIGH (ref 70–99)
Glucose-Capillary: 376 mg/dL — ABNORMAL HIGH (ref 70–99)
Glucose-Capillary: 306 mg/dL — ABNORMAL HIGH (ref 70–99)

## 2022-02-16 LAB — PROTIME-INR
INR: 1.3 — ABNORMAL HIGH (ref 0.8–1.2)
Prothrombin Time: 16.2 seconds — ABNORMAL HIGH (ref 11.4–15.2)

## 2022-02-16 MED ORDER — WARFARIN SODIUM 2.5 MG PO TABS
3.7500 mg | ORAL_TABLET | Freq: Once | ORAL | Status: AC
  Administered 2022-02-16: 3.75 mg via ORAL
  Filled 2022-02-16: qty 1

## 2022-02-16 MED ORDER — METHOCARBAMOL 750 MG PO TABS: 750.0000 mg | ORAL_TABLET | Freq: Four times a day (QID) | ORAL | Status: DC | PRN

## 2022-02-16 MED ORDER — IBUPROFEN 600 MG PO TABS
600.0000 mg | ORAL_TABLET | Freq: Four times a day (QID) | ORAL | Status: DC | PRN
  Administered 2022-02-16: 600 mg via ORAL
  Filled 2022-02-16: qty 1

## 2022-02-16 MED ORDER — POLYETHYLENE GLYCOL 3350 17 G PO PACK
17.0000 g | PACK | Freq: Every day | ORAL | Status: DC
  Administered 2022-02-16 – 2022-02-17 (×2): 17 g via ORAL
  Filled 2022-02-16 (×2): qty 1

## 2022-02-16 MED ORDER — INSULIN REGULAR HUMAN (CONC) 500 UNIT/ML ~~LOC~~ SOPN
25.0000 [IU] | PEN_INJECTOR | Freq: Three times a day (TID) | SUBCUTANEOUS | Status: DC
  Administered 2022-02-16 – 2022-02-17 (×4): 25 [IU] via SUBCUTANEOUS
  Filled 2022-02-16: qty 3

## 2022-02-16 MED ORDER — WARFARIN - PHARMACIST DOSING INPATIENT: Freq: Every day | Status: DC

## 2022-02-16 NOTE — Final Consult Note (Signed)
Consultant Final Sign-Off Note    Assessment/Final recommendations  Donna Snow is a 71 y.o. female followed by me for:  Skin abscess with cellulitis - No drainable fluid collection on CT. Re-opened at bedside 10/4 as noted above. Packed with iodoform gauze to keep it open. Appears to be adequately draining. Cellulitis improved. No areas of fluctuance or obvious un drained fluid collections. She is afebrile, without systolic hypotension or tachycardia. WBC wnl. Okay to transition to oral abx from our standpoint and d/c. Recommend 7-10d abx total with hx of dm2 and recent elevated A1c. She should change the iodoform packing daily and cover with dry gauze - changing more often as needed for soiling. Okay to shower after removing packing - recommend letting soapy water run over the wound, pat dry and then replace the packing and dry dressing. She reports her daughter that lives with her plans to come by today to learn how to do the packing/dressing changes. We will arrange a follow up appointment in the office for a wound check.   Wound care (if applicable): As above   Diet at discharge: per primary team   Activity at discharge: per primary team   Follow-up appointment:  Will arrange for wound check.    Pending results:  Unresulted Labs (From admission, onward)     Start     Ordered   02/16/22 0500  Protime-INR  Daily at 5am,   R      02/15/22 1503             Medication recommendations: Abx recs as above   Other recommendations:    Thank you for allowing [place] to participate in the care of your patient!  Please consult [place] again if you have further needs for your patient.  Barth Kirks [facility] 02/16/2022 7:58 AM    Subjective   Feeling better. Less pain over wound. Reports the area has been draining purulent fluid.   Objective  Vital signs in last 24 hours: Temp:  [97.5 F (36.4 C)-98 F (36.7 C)] 97.5 F (36.4 C) (10/05 0533) Pulse Rate:  [56-70] 56 (10/05 0533) Resp:   [16-18] 16 (10/04 1717) BP: (109-141)/(42-60) 139/60 (10/05 0533) SpO2:  [90 %-100 %] 95 % (10/05 0533)  Gen: Awake and alert, nad Gen:  Alert, NAD, pleasant Abd: Soft, ND, +BS. Abdomen NT except over wound. Wound over the right upper abdomen is circular, measuring ~1.5 x 1cm, with purulent drainage on the dressing. There is some granulation tissue at the inferior aspect of the wound that appears superficial. The wound is open superiorly tracking ~1cm superiorly and ~0.5cm medially & laterally. It does not seem to track inferiorly. There is about ~4-5 cm of induration in greatest diameter surrounding the wound. There may be a small amount of warmth but difficult to tell. Erythema improved from yesterday and from original picture. See picture below.     Pertinent labs and Studies: Recent Labs    02/13/22 1145 02/14/22 0200 02/15/22 0253  WBC 6.6 5.7 6.6  HGB 13.5 12.8 12.6  HCT 40.3 38.4 37.8   BMET Recent Labs    02/14/22 0200 02/15/22 0253  NA 137 140  K 3.4* 3.6  CL 104 103  CO2 24 27  GLUCOSE 232* 230*  BUN 6* 7*  CREATININE 0.74 0.87  CALCIUM 8.2* 8.5*   No results for input(s): "LABURIN" in the last 72 hours. Results for orders placed or performed during the hospital encounter of 02/13/22  Blood culture (routine x  2)     Status: None (Preliminary result)   Collection Time: 02/13/22  8:16 PM   Specimen: BLOOD RIGHT FOREARM  Result Value Ref Range Status   Specimen Description BLOOD RIGHT FOREARM  Final   Special Requests   Final    BOTTLES DRAWN AEROBIC AND ANAEROBIC Blood Culture adequate volume   Culture   Final    NO GROWTH 3 DAYS Performed at [facility], [address], [place], [place] [address]    Report Status PENDING  Incomplete  Blood culture (routine x 2)     Status: None (Preliminary result)   Collection Time: 02/13/22  8:52 PM   Specimen: BLOOD  Result Value Ref Range Status   Specimen Description BLOOD SITE NOT SPECIFIED  Final   Special  Requests   Final    BOTTLES DRAWN AEROBIC AND ANAEROBIC Blood Culture adequate volume   Culture   Final    NO GROWTH 3 DAYS Performed at [facility], [address], [place], [place] [address]    Report Status PENDING  Incomplete  MRSA Next Gen by PCR, Nasal     Status: None   Collection Time: 02/15/22  5:29 AM   Specimen: Nasal Mucosa; Nasal Swab  Result Value Ref Range Status   MRSA by PCR Next Gen NOT DETECTED NOT DETECTED Final    Comment: (NOTE) The GeneXpert MRSA Assay (FDA approved for NASAL specimens only), is one component of a comprehensive MRSA colonization surveillance program. It is not intended to diagnose MRSA infection nor to guide or monitor treatment for MRSA infections. Test performance is not FDA approved in patients less than 75 years old. Performed at [facility], [address] [place], [place] [address]     Imaging: No results found.

## 2022-02-16 NOTE — Progress Notes (Signed)
ANTICOAGULATION CONSULT NOTE - Initial Consult  Pharmacy Consult for Warfarin Indication: atrial fibrillation  Allergies  Allergen Reactions   Latex Other (See Comments)    Welts - cant wear them, but can be touched by someone who wears them.   Penicillins Hives and Itching    Tolerated ceftriaxone 10/2020 " facial swelling"    Patient Measurements: Height: 5\' 3"  (160 cm) Weight: (!) 146.5 kg (323 lb) IBW/kg (Calculated) : 52.4  Vital Signs: Temp: 97.7 F (36.5 C) (10/05 0845) Temp Source: Oral (10/05 0845) BP: 120/56 (10/05 0845) Pulse Rate: 56 (10/05 0845)  Labs: Recent Labs    02/13/22 1145 02/13/22 2053 02/13/22 2138 02/14/22 0200 02/14/22 0936 02/15/22 0253 02/16/22 0234  HGB 13.5  --   --  12.8  --  12.6  --   HCT 40.3  --   --  38.4  --  37.8  --   PLT 299  --   --  304  --  310  --   LABPROT  --    < >  --  28.4*  --  25.3* 16.2*  INR  --    < >  --  2.7*  --  2.3* 1.3*  CREATININE 0.82  --   --  0.74  --  0.87  --   TROPONINIHS  --   --  5  --  6  --   --    < > = values in this interval not displayed.    Estimated Creatinine Clearance: 86.7 mL/min (by C-G formula based on SCr of 0.87 mg/dL).   Medical History: Past Medical History:  Diagnosis Date   Anxiety    CHF (congestive heart failure) (HCC)    Diabetes mellitus    Hypercholesteremia    Hypertension    Obesity    Seizures (HCC)     Medications:  Scheduled:   busPIRone  20 mg Oral TID   ezetimibe  10 mg Oral Daily   FLUoxetine  60 mg Oral Daily   fluticasone furoate-vilanterol  1 puff Inhalation Daily   furosemide  40 mg Oral Q M,W,F   furosemide  60 mg Oral Q T,Th,S,Su   hydrOXYzine  25 mg Oral TID   insulin regular human CONCENTRATED  25 Units Subcutaneous TID WC   loratadine  10 mg Oral Daily   losartan  50 mg Oral Daily   pantoprazole  40 mg Oral Daily   phenytoin  400 mg Oral Daily   polyethylene glycol  17 g Oral Daily   prazosin  2 mg Oral QHS   rosuvastatin  40 mg Oral  Daily   tiZANidine  8 mg Oral QHS   topiramate  200 mg Oral BID   traZODone  300 mg Oral QHS   warfarin  3.75 mg Oral ONCE-1600   Warfarin - Pharmacist Dosing Inpatient   Does not apply q1600    Assessment: 71 YOF presenting with abdominal wall cellulitis with abscess. History of atrial fibrillation on Warfarin at home. Warfarin has been on hold since admission to assess for I&D and surgical needs. Last dose given 10/2 per med history report. Pharmacy has been consulted to re-start warfarin prior to discharge.  Home regimen is 3.75mg  TTSS and 7.5mg  MWF.  She received 5mg  PO Vit K on 10/3 and today her INR is 1.3 - No concerns of bleeding.  Goal of Therapy:  INR 2-3 Monitor platelets by anticoagulation protocol: Yes   Plan:  Warfarin 3.75mg  PO once  tonight Daily INR Monitor signs/symptoms of bleeding.  Anticipate discharge in the next 24 hours - plan to discharge on home regimen  Ellis Savage, PharmD Clinical Pharmacist 02/16/2022,10:49 AM

## 2022-02-16 NOTE — Discharge Instructions (Signed)
Please change packing daily and cover with dry guaze. Change more frequently for soiling.  Okay to remove packing and shower. Let soapy water run over the wound and pat dry. Please replace packing and cover with dry gauze when done.  We will arrange a wound check in our office ([facility] [place] [facility]). If you have not heard from [place] 1-2day after discharge, please call the office to confirm your appointment date and time.   Call the office or seek medical care if you have More redness, swelling, or pain around your abscess. Have severe pain. See red streaks on your skin spreading away from the abscess. See redness that spreads quickly. Have a fever or chills. Any other concerns

## 2022-02-16 NOTE — Progress Notes (Signed)
Pharmacy Antibiotic Note  Donna Snow is a 71 y.o. female for which pharmacy has been consulted for vancomycin dosing for cellulitis.  Renal function is stable with CrCl 80-90 mL/min.  Plan: Continue vancomycin 1750mg  Q24H (eAUC 493). No plans for level assessment Anticipate discharge in the next 24 hours, to be transitioned to oral antibiotics to complete a 7-10 day course.  Height: 5\' 3"  (160 cm) Weight: (!) 146.5 kg (323 lb) IBW/kg (Calculated) : 52.4  Temp (24hrs), Avg:97.8 F (36.6 C), Min:97.5 F (36.4 C), Max:98 F (36.7 C)  Recent Labs  Lab 02/13/22 1145 02/13/22 2053 02/14/22 0200 02/15/22 0253  WBC 6.6  --  5.7 6.6  CREATININE 0.82  --  0.74 0.87  LATICACIDVEN  --  1.6  --   --      Estimated Creatinine Clearance: 86.7 mL/min (by C-G formula based on SCr of 0.87 mg/dL).    Allergies  Allergen Reactions   Latex Other (See Comments)    Welts - cant wear them, but can be touched by someone who wears them.   Penicillins Hives and Itching    Tolerated ceftriaxone 10/2020 " facial swelling"    Erskine Speed, PharmD Clinical Pharmacist

## 2022-02-16 NOTE — Progress Notes (Signed)
PROGRESS NOTE  Shakina Choy  QQI:[id number] DOB: 1950/09/03 DOA: 02/13/2022 PCP: [facility]   Brief Narrative:  Patient is a 71 year old female with history of paroxysmal A-fib on Coumadin, anxiety, chronic diastolic CHF, diabetes type 2, hypertension, hyperlipidemia, obesity, seizure, OSA, abdominal wall abscess who presented to the emergency room with complaint of recurrent abdominal wall infection at the site of insulin injection.  CT abdomen showed no evidence of drainable fluid collection/abscess.  General surgery is also following, no plan for surgical intervention and cleared for discharge with oral antibiotics with outpatient follow-up.  Patient does not feel ready to go home.  We are planning to send her home tomorrow with oral antibiotics  Assessment & Plan:  Principal Problem:   Abdominal wall cellulitis Active Problems:   Abdominal wall abscess   Essential hypertension   Hyperlipidemia   Type 2 diabetes mellitus (HCC)   AF (paroxysmal atrial fibrillation) (HCC)   Anxiety state   Chronic diastolic CHF (congestive heart failure) (HCC)   Chest pain   Cellulitis of abdominal wall   Abdominal wall cellulitis: No evidence of abscess or fluid on CT.  Wound check by general surgery with purulent material noted.  Currently being observed with antibiotics without plan for  I&D.  On vancomycin.will change to Augmentin.  Plan for 7 to 10 days of antibiotics  Chest pain: Troponins have been negative.  No wall motion abnormality in the echo.  Chest pain has resolved.  Hypokalemia: Supplemented and corrected  Paroxysmal A-fib: On Coumadin for anticoagulation,resumed  Chronic diastolic CHF: Echo done here showed EF of 55 to 94%, grade 1 diastolic dysfunction.  Currently euvolemic.  Continue Lasix as per home dose  Diabetes type 2: Recent A1c of 8.1, continue current insulin regimen.  Diabetic coordinator following  Anxiety disorder: continue home medications:  buspirone, Prozac, hydroxyzine  Hypertension: Currently normotensive.  Continue losartan  Hyperlipidemia: Continue home medication Crestor  Seizure disorder: On Dilantin  Morbid obesity: BMI 57.22          DVT prophylaxis:None warfarin (COUMADIN) tablet 3.75 mg     Code Status: Full Code  Family Communication: None at bedside  Patient status:Obs  Patient is from :Home  Anticipated discharge RD:EYCX  Estimated DC date:tomorrow   Consultants: Surgery  Procedures:None  Antimicrobials:  Anti-infectives (From admission, onward)    Start     Dose/Rate Route Frequency Ordered Stop   02/14/22 2100  vancomycin (VANCOREADY) IVPB 1750 mg/350 mL        1,750 mg 175 mL/hr over 120 Minutes Intravenous Every 24 hours 02/13/22 2300     02/13/22 2015  vancomycin (VANCOREADY) IVPB 2000 mg/400 mL        2,000 mg 200 mL/hr over 120 Minutes Intravenous  Once 02/13/22 2007 02/13/22 2300       Subjective: Patient seen and examined at the bedside.  Appears comfortable today.  Her wound was dressed this morning.  We discussed about going home today with oral antibiotics but she says she lives alone and does not feel comfortable to go home.  She wants to go home tomorrow  Objective: Vitals:   02/15/22 1717 02/15/22 2049 02/16/22 0533 02/16/22 0845  BP: (!) 109/42 (!) 141/57 139/60 (!) 120/56  Pulse: 62 (!) 59 (!) 56 (!) 56  Resp: 16   17  Temp: 98 F (36.7 C) 98 F (36.7 C) (!) 97.5 F (36.4 C) 97.7 F (36.5 C)  TempSrc: Oral Oral Oral Oral  SpO2: 97% 90% 95% 96%  Weight:      Height:        Intake/Output Summary (Last 24 hours) at 02/16/2022 1207 Last data filed at 02/16/2022 0849 Gross per 24 hour  Intake 360 ml  Output --  Net 360 ml   Filed Weights   02/14/22 0737  Weight: (!) 146.5 kg    Examination:  General exam: Overall comfortable, not in distress HEENT: PERRL Respiratory system:  no wheezes or crackles  Cardiovascular system: S1 & S2 heard, RRR.   Gastrointestinal system: Abdomen is nondistended, soft and nontender.  Periumbilical wound covered with dressing Central nervous system: Alert and oriented Extremities: No edema, no clubbing ,no cyanosis Skin: No rashes, no ulcers,no icterus     Data Reviewed: I have personally reviewed following labs and imaging studies  CBC: Recent Labs  Lab 02/13/22 1145 02/14/22 0200 02/15/22 0253  WBC 6.6 5.7 6.6  NEUTROABS 4.2  --   --   HGB 13.5 12.8 12.6  HCT 40.3 38.4 37.8  MCV 86.5 86.1 86.5  PLT 299 304 310   Basic Metabolic Panel: Recent Labs  Lab 02/13/22 1145 02/14/22 0200 02/15/22 0253  NA 138 137 140  K 3.4* 3.4* 3.6  CL 106 104 103  CO2 24 24 27   GLUCOSE 125* 232* 230*  BUN 8 6* 7*  CREATININE 0.82 0.74 0.87  CALCIUM 8.5* 8.2* 8.5*  MG  --  1.7 1.8     Recent Results (from the past 240 hour(s))  Blood culture (routine x 2)     Status: None (Preliminary result)   Collection Time: 02/13/22  8:16 PM   Specimen: BLOOD RIGHT FOREARM  Result Value Ref Range Status   Specimen Description BLOOD RIGHT FOREARM  Final   Special Requests   Final    BOTTLES DRAWN AEROBIC AND ANAEROBIC Blood Culture adequate volume   Culture   Final    NO GROWTH 3 DAYS Performed at [facility], [address], [place], [place]    Report Status PENDING  Incomplete  Blood culture (routine x 2)     Status: None (Preliminary result)   Collection Time: 02/13/22  8:52 PM   Specimen: BLOOD  Result Value Ref Range Status   Specimen Description BLOOD SITE NOT SPECIFIED  Final   Special Requests   Final    BOTTLES DRAWN AEROBIC AND ANAEROBIC Blood Culture adequate volume   Culture   Final    NO GROWTH 3 DAYS Performed at [facility], [address], [place], [place]    Report Status PENDING  Incomplete  MRSA Next Gen by PCR, Nasal     Status: None   Collection Time: 02/15/22  5:29 AM   Specimen: Nasal Mucosa; Nasal Swab  Result Value Ref Range Status    MRSA by PCR Next Gen NOT DETECTED NOT DETECTED Final    Comment: (NOTE) The GeneXpert MRSA Assay (FDA approved for NASAL specimens only), is one component of a comprehensive MRSA colonization surveillance program. It is not intended to diagnose MRSA infection nor to guide or monitor treatment for MRSA infections. Test performance is not FDA approved in patients less than 31 years old. Performed at [facility], [address], [place], [place]      Radiology Studies: ECHOCARDIOGRAM COMPLETE  Result Date: 02/14/2022    ECHOCARDIOGRAM REPORT   Patient Name:   ORVA RILES Date of Exam: 02/14/2022 Medical Rec #:  04/16/2022    Height:  63.0 in Accession #:    [id number]   Weight:       323.0 lb Date of Birth:  28-Jul-1950    BSA:          2.371 m Patient Age:    70 years     BP:           140/63 mmHg Patient Gender: F            HR:           70 bpm. Exam Location:  Inpatient Procedure: 2D Echo, Cardiac Doppler and Color Doppler Indications:    Chest pain R07.9  History:        Patient has prior history of Echocardiogram examinations, most                 recent 10/16/2020. Angina, Arrythmias:Atrial Fibrillation; Risk                 Factors:Hypertension, Sleep Apnea, Diabetes and Dyslipidemia.  Sonographer:    Lucendia Herrlich Referring Phys: [id number] RATHORE  Sonographer Comments: Exam terminated per patient request IMPRESSIONS  1. Left ventricular ejection fraction, by estimation, is 55 to 60%. The left ventricle has normal function. Left ventricular endocardial border not optimally defined to evaluate regional wall motion. The left ventricular internal cavity size was mildly dilated. Left ventricular diastolic parameters are consistent with Grade I diastolic dysfunction (impaired relaxation). Elevated left atrial pressure.  2. Right ventricular systolic function is normal. The right ventricular size is normal. Tricuspid regurgitation signal is inadequate for assessing PA  pressure.  3. Left atrial size was severely dilated.  4. The mitral valve is normal in structure. Trivial mitral valve regurgitation.  5. The aortic valve is tricuspid. There is mild calcification of the aortic valve. Aortic valve regurgitation is not visualized. Aortic valve sclerosis is present, with no evidence of aortic valve stenosis. Comparison(s): No significant change from prior study. Prior images reviewed side by side. FINDINGS  Left Ventricle: Images are not adequate to evaluate regional wall motion. The patient refused administration of Definity contrast. Left ventricular ejection fraction, by estimation, is 55 to 60%. The left ventricle has normal function. Left ventricular endocardial border not optimally defined to evaluate regional wall motion. The left ventricular internal cavity size was mildly dilated. There is no left ventricular hypertrophy. Left ventricular diastolic parameters are consistent with Grade I diastolic  dysfunction (impaired relaxation). Elevated left atrial pressure. Right Ventricle: The right ventricular size is normal. Right vetricular wall thickness was not well visualized. Right ventricular systolic function is normal. Tricuspid regurgitation signal is inadequate for assessing PA pressure. Left Atrium: Left atrial size was severely dilated. Right Atrium: Right atrial size was normal in size. Pericardium: There is no evidence of pericardial effusion. Mitral Valve: The mitral valve is normal in structure. Trivial mitral valve regurgitation. Tricuspid Valve: The tricuspid valve is grossly normal. Tricuspid valve regurgitation is not demonstrated. Aortic Valve: The aortic valve is tricuspid. There is mild calcification of the aortic valve. Aortic valve regurgitation is not visualized. Aortic valve sclerosis is present, with no evidence of aortic valve stenosis. Aortic valve mean gradient measures 6.0 mmHg. Aortic valve peak gradient measures 10.5 mmHg. Aortic valve area, by VTI  measures 2.64 cm. Pulmonic Valve: The pulmonic valve was not well visualized. Pulmonic valve regurgitation is not visualized. Aorta: The aortic root is normal in size and structure. IAS/Shunts: No atrial level shunt detected by color flow Doppler.  LEFT VENTRICLE PLAX 2D LVIDd:  5.80 cm   Diastology LVIDs:         3.90 cm   LV e' medial:    7.18 cm/s LV PW:         0.77 cm   LV E/e' medial:  13.8 LV IVS:        1.13 cm   LV e' lateral:   9.03 cm/s LVOT diam:     2.20 cm   LV E/e' lateral: 11.0 LV SV:         89 LV SV Index:   37 LVOT Area:     3.80 cm  LEFT ATRIUM           Index        RIGHT ATRIUM           Index LA diam:      5.40 cm 2.28 cm/m   RA Area:     15.70 cm LA Vol (A4C): 67.4 ml 28.43 ml/m  RA Volume:   41.10 ml  17.34 ml/m  AORTIC VALVE AV Area (Vmax):    2.42 cm AV Area (Vmean):   2.44 cm AV Area (VTI):     2.64 cm AV Vmax:           162.00 cm/s AV Vmean:          108.000 cm/s AV VTI:            0.335 m AV Peak Grad:      10.5 mmHg AV Mean Grad:      6.0 mmHg LVOT Vmax:         103.00 cm/s LVOT Vmean:        69.200 cm/s LVOT VTI:          0.233 m LVOT/AV VTI ratio: 0.70  AORTA Ao Root diam: 3.30 cm Ao Asc diam:  2.90 cm MITRAL VALVE MV Area (PHT): 4.49 cm     SHUNTS MV Decel Time: 169 msec     Systemic VTI:  0.23 m MV E velocity: 99.00 cm/s   Systemic Diam: 2.20 cm MV A velocity: 106.00 cm/s MV E/A ratio:  0.93 Mihai Croitoru MD Electronically signed by Thurmon Fair MD Signature Date/Time: 02/14/2022/2:06:09 PM    Final     Scheduled Meds:  busPIRone  20 mg Oral TID   ezetimibe  10 mg Oral Daily   FLUoxetine  60 mg Oral Daily   fluticasone furoate-vilanterol  1 puff Inhalation Daily   furosemide  40 mg Oral Q M,W,F   furosemide  60 mg Oral Q T,Th,S,Su   hydrOXYzine  25 mg Oral TID   insulin regular human CONCENTRATED  25 Units Subcutaneous TID WC   loratadine  10 mg Oral Daily   losartan  50 mg Oral Daily   pantoprazole  40 mg Oral Daily   phenytoin  400 mg Oral Daily    polyethylene glycol  17 g Oral Daily   prazosin  2 mg Oral QHS   rosuvastatin  40 mg Oral Daily   tiZANidine  8 mg Oral QHS   topiramate  200 mg Oral BID   traZODone  300 mg Oral QHS   warfarin  3.75 mg Oral ONCE-1600   Warfarin - Pharmacist Dosing Inpatient   Does not apply q1600   Continuous Infusions:  vancomycin 1,750 mg (02/15/22 2216)     LOS: 1 day   Burnadette Pop, MD [facility] P10/09/2021, 12:07 PM

## 2022-02-17 ENCOUNTER — Other Ambulatory Visit (HOSPITAL_COMMUNITY): Payer: Self-pay

## 2022-02-17 DIAGNOSIS — L03311 Cellulitis of abdominal wall: Secondary | ICD-10-CM | POA: Diagnosis not present

## 2022-02-17 LAB — GLUCOSE, CAPILLARY
Glucose-Capillary: 283 mg/dL — ABNORMAL HIGH (ref 70–99)
Glucose-Capillary: 326 mg/dL — ABNORMAL HIGH (ref 70–99)

## 2022-02-17 LAB — PROTIME-INR
INR: 1.2 (ref 0.8–1.2)
Prothrombin Time: 14.6 seconds (ref 11.4–15.2)

## 2022-02-17 MED ORDER — CEFADROXIL 500 MG PO CAPS
500.0000 mg | ORAL_CAPSULE | Freq: Two times a day (BID) | ORAL | 0 refills | Status: AC
  Filled 2022-02-17: qty 14, 7d supply, fill #0

## 2022-02-17 MED ORDER — DOXYCYCLINE HYCLATE 100 MG PO TABS
100.0000 mg | ORAL_TABLET | Freq: Two times a day (BID) | ORAL | 0 refills | Status: AC
  Filled 2022-02-17: qty 14, 7d supply, fill #0

## 2022-02-17 MED ORDER — DOXYCYCLINE HYCLATE 100 MG PO TABS
100.0000 mg | ORAL_TABLET | Freq: Two times a day (BID) | ORAL | Status: DC
  Administered 2022-02-17: 100 mg via ORAL
  Filled 2022-02-17: qty 1

## 2022-02-17 MED ORDER — FLEET ENEMA 7-19 GM/118ML RE ENEM
1.0000 | ENEMA | Freq: Once | RECTAL | Status: AC
  Administered 2022-02-17: 1 via RECTAL
  Filled 2022-02-17: qty 1

## 2022-02-17 MED ORDER — SULFAMETHOXAZOLE-TRIMETHOPRIM 800-160 MG PO TABS: 1.0000 | ORAL_TABLET | Freq: Two times a day (BID) | ORAL | Status: DC

## 2022-02-17 MED ORDER — SULFAMETHOXAZOLE-TRIMETHOPRIM 800-160 MG PO TABS: 1.0000 | ORAL_TABLET | Freq: Two times a day (BID) | ORAL | 0 refills | Status: DC

## 2022-02-17 MED ORDER — WARFARIN SODIUM 7.5 MG PO TABS
7.5000 mg | ORAL_TABLET | Freq: Once | ORAL | Status: DC
  Filled 2022-02-17: qty 1

## 2022-02-17 MED ORDER — CEFADROXIL 500 MG PO CAPS
500.0000 mg | ORAL_CAPSULE | Freq: Two times a day (BID) | ORAL | Status: DC
  Administered 2022-02-17: 500 mg via ORAL
  Filled 2022-02-17: qty 1

## 2022-02-17 NOTE — Discharge Summary (Addendum)
Physician Discharge Summary  Donna Snow WUJ:[id number] DOB: 01/06/1951 DOA: 02/13/2022  PCP: [facility]  Admit date: 02/13/2022 Discharge date: 02/17/2022  Admitted From: Home Disposition:  Home  Discharge Condition:Stable CODE STATUS:FULL Diet recommendation:  Carb Modified   Brief/Interim Summary:  Patient is a 71 year old female with history of paroxysmal A-fib on Coumadin, anxiety, chronic diastolic CHF, diabetes type 2, hypertension, hyperlipidemia, obesity, seizure, OSA, abdominal wall abscess who presented to the emergency room with complaint of recurrent abdominal wall infection at the site of insulin injection.  CT abdomen showed no evidence of drainable fluid collection/abscess.  General surgery is also following, no plan for surgical intervention and cleared for discharge with oral antibiotics with outpatient follow-up.  General surgery will arrange outpatient follow-up with wound check.  Medically stable for discharge home today.  Following problems were addressed during her hospitalization:  Abdominal wall cellulitis: No evidence of abscess or fluid on CT.  Wound check by general surgery with purulent material noted.  Currently being observed with antibiotics without plan for  I&D.Changed antibiotics to oral.  She will follow-up with surgery/wound care clinic   Chest pain: Troponins have been negative.  No wall motion abnormality in the echo.  Chest pain has resolved.   Hypokalemia: Supplemented and corrected   Paroxysmal A-fib: On Coumadin for anticoagulation,resumed   Chronic diastolic CHF: Echo done here showed EF of 55 to 60%, grade 1 diastolic dysfunction.  Currently euvolemic.  Continue Lasix as per home dose   Diabetes type 2: Recent A1c of 8.1, continue current insulin regimen.  Diabetic coordinator following   Anxiety disorder: continue home medications: buspirone, Prozac, hydroxyzine   Hypertension: Currently normotensive.  Continue losartan    Hyperlipidemia: Continue home medication :Crestor   Seizure disorder: On Dilantin   Morbid obesity: BMI 57.22  Discharge Diagnoses:  Principal Problem:   Abdominal wall cellulitis Active Problems:   Abdominal wall abscess   Essential hypertension   Hyperlipidemia   Type 2 diabetes mellitus (HCC)   AF (paroxysmal atrial fibrillation) (HCC)   Anxiety state   Chronic diastolic CHF (congestive heart failure) (HCC)   Chest pain   Cellulitis of abdominal wall    Discharge Instructions  Discharge Instructions     Diet - low sodium heart healthy   Complete by: As directed    Diet Carb Modified   Complete by: As directed    Discharge instructions   Complete by: As directed    1)Please take prescribed medications as instructed 2)Follow up with general surgery as an outpatient.  Name and number the provider group has been attached   Discharge wound care:   Complete by: As directed    Change the iodoform packing daily and cover with dry gauze - changing more often as needed for soiling. Okay to shower after removing packing - recommend letting soapy water run over the wound, pat dry and then replace the packing and dry dressing.   Increase activity slowly   Complete by: As directed       Allergies as of 02/17/2022       Reactions   Latex Other (See Comments)   Welts - cant wear them, but can be touched by someone who wears them.   Penicillins Hives, Itching   Tolerated ceftriaxone 10/2020 " facial swelling"        Medication List     TAKE these medications    albuterol 108 (90 Base) MCG/ACT inhaler Commonly known as: VENTOLIN HFA Inhale 2 puffs into  the lungs every 4 (four) hours as needed for shortness of breath.   busPIRone 10 MG tablet Commonly known as: BUSPAR Take 20 mg by mouth 3 (three) times daily.   cefadroxil 500 MG capsule Commonly known as: DURICEF Take 1 capsule (500 mg total) by mouth 2 (two) times daily for 7 days.   doxycycline 100 MG  tablet Commonly known as: VIBRA-TABS Take 1 tablet (100 mg total) by mouth 2 (two) times daily for 7 days.   ezetimibe 10 MG tablet Commonly known as: ZETIA Take 1 tablet by mouth daily.   FLUoxetine 20 MG capsule Commonly known as: PROZAC Take 20 mg by mouth daily. Take in addition to the 40 mg capsule for a total of 60 mg daily.   FLUoxetine 40 MG capsule Commonly known as: PROZAC Take 40 mg by mouth daily. Take in addition to the 20 mg capsule for a total of 60 mg daily.   fluticasone furoate-vilanterol 100-25 MCG/INH Aepb Commonly known as: BREO ELLIPTA Inhale 1 puff into the lungs daily as needed (for shortness of breath).   furosemide 40 MG tablet Commonly known as: LASIX Take 40-60 mg by mouth See admin instructions. Take 40mg  by mouth once daily on Monday, Wednesday, Friday, then take 60mg  all other days   hydrOXYzine 25 MG tablet Commonly known as: ATARAX Take 25 mg by mouth 3 (three) times daily.   Hyoscyamine Sulfate SL 0.125 MG Subl Place 1 tablet under the tongue 3 (three) times daily as needed (stomach cramps).   IBU 800 MG tablet Generic drug: ibuprofen Take 1 tablet (800 mg total) by mouth every 8 (eight) hours as needed for moderate pain.   insulin regular human CONCENTRATED 500 UNIT/ML injection Commonly known as: HUMULIN R Inject 15-21 Units into the skin See admin instructions. Sliding scale 3 times daily   loratadine 10 MG tablet Commonly known as: CLARITIN Take 10 mg by mouth daily.   losartan 50 MG tablet Commonly known as: COZAAR Take 50 mg by mouth daily.   omeprazole 40 MG capsule Commonly known as: PRILOSEC Take 40 mg by mouth at bedtime.   phenytoin 100 MG ER capsule Commonly known as: DILANTIN Take 400 mg by mouth daily.   prazosin 2 MG capsule Commonly known as: MINIPRESS Take 2 mg by mouth at bedtime.   rosuvastatin 40 MG tablet Commonly known as: CRESTOR Take 1 tablet by mouth daily.   Semaglutide(0.25 or 0.5MG /DOS) 2  MG/1.5ML Sopn Inject 0.25 mg into the skin every Friday.   tiZANidine 4 MG tablet Commonly known as: ZANAFLEX Take 8 mg by mouth at bedtime.   topiramate 100 MG tablet Commonly known as: TOPAMAX Take 200 mg by mouth 2 (two) times daily.   traZODone 150 MG tablet Commonly known as: DESYREL Take 300 mg by mouth at bedtime.   warfarin 7.5 MG tablet Commonly known as: Coumadin Take 1 tablet (7.5 mg total) by mouth daily at 4 PM. What changed:  how much to take when to take this additional instructions               Discharge Care Instructions  (From admission, onward)           Start     Ordered   02/17/22 0000  Discharge wound care:       Comments: Change the iodoform packing daily and cover with dry gauze - changing more often as needed for soiling. Okay to shower after removing packing - recommend letting soapy water run  over the wound, pat dry and then replace the packing and dry dressing.   02/17/22 1050            Follow-up Information     [facility] Schedule an appointment as soon as possible for a visit.   Specialty: Internal Medicine Why: For follow up Contact information: [address] [place] [address] [phone]         [facility] [place]. Schedule an appointment as soon as possible for a visit in 1 week(s).   Specialty: General Surgery Contact information: [address] [place] [address] [phone]                Allergies  Allergen Reactions   Latex Other (See Comments)    Welts - cant wear them, but can be touched by someone who wears them.   Penicillins Hives and Itching    Tolerated ceftriaxone 10/2020 " facial swelling"    Consultations: Surgery   Procedures/Studies: ECHOCARDIOGRAM COMPLETE  Result Date: 02/14/2022    ECHOCARDIOGRAM REPORT   Patient Name:   Donna Snow Date of Exam: 02/14/2022 Medical Rec #:  [id number]    Height:       63.0 in  Accession #:    [id number]   Weight:       323.0 lb Date of Birth:  Mar 21, 1951    BSA:          2.371 m Patient Age:    70 years     BP:           140/63 mmHg Patient Gender: F            HR:           70 bpm. Exam Location:  Inpatient Procedure: 2D Echo, Cardiac Doppler and Color Doppler Indications:    Chest pain R07.9  History:        Patient has prior history of Echocardiogram examinations, most                 recent 10/16/2020. Angina, Arrythmias:Atrial Fibrillation; Risk                 Factors:Hypertension, Sleep Apnea, Diabetes and Dyslipidemia.  Sonographer:    Lucendia Herrlich Referring Phys: [id number] LSLHTDSKA RATHORE  Sonographer Comments: Exam terminated per patient request IMPRESSIONS  1. Left ventricular ejection fraction, by estimation, is 55 to 60%. The left ventricle has normal function. Left ventricular endocardial border not optimally defined to evaluate regional wall motion. The left ventricular internal cavity size was mildly dilated. Left ventricular diastolic parameters are consistent with Grade I diastolic dysfunction (impaired relaxation). Elevated left atrial pressure.  2. Right ventricular systolic function is normal. The right ventricular size is normal. Tricuspid regurgitation signal is inadequate for assessing PA pressure.  3. Left atrial size was severely dilated.  4. The mitral valve is normal in structure. Trivial mitral valve regurgitation.  5. The aortic valve is tricuspid. There is mild calcification of the aortic valve. Aortic valve regurgitation is not visualized. Aortic valve sclerosis is present, with no evidence of aortic valve stenosis. Comparison(s): No significant change from prior study. Prior images reviewed side by side. FINDINGS  Left Ventricle: Images are not adequate to evaluate regional wall motion. The patient refused administration of Definity contrast. Left ventricular ejection fraction, by estimation, is 55 to 60%. The left ventricle has normal function. Left  ventricular endocardial border not optimally defined to evaluate regional wall motion.  The left ventricular internal cavity size was mildly dilated. There is no left ventricular hypertrophy. Left ventricular diastolic parameters are consistent with Grade I diastolic  dysfunction (impaired relaxation). Elevated left atrial pressure. Right Ventricle: The right ventricular size is normal. Right vetricular wall thickness was not well visualized. Right ventricular systolic function is normal. Tricuspid regurgitation signal is inadequate for assessing PA pressure. Left Atrium: Left atrial size was severely dilated. Right Atrium: Right atrial size was normal in size. Pericardium: There is no evidence of pericardial effusion. Mitral Valve: The mitral valve is normal in structure. Trivial mitral valve regurgitation. Tricuspid Valve: The tricuspid valve is grossly normal. Tricuspid valve regurgitation is not demonstrated. Aortic Valve: The aortic valve is tricuspid. There is mild calcification of the aortic valve. Aortic valve regurgitation is not visualized. Aortic valve sclerosis is present, with no evidence of aortic valve stenosis. Aortic valve mean gradient measures 6.0 mmHg. Aortic valve peak gradient measures 10.5 mmHg. Aortic valve area, by VTI measures 2.64 cm. Pulmonic Valve: The pulmonic valve was not well visualized. Pulmonic valve regurgitation is not visualized. Aorta: The aortic root is normal in size and structure. IAS/Shunts: No atrial level shunt detected by color flow Doppler.  LEFT VENTRICLE PLAX 2D LVIDd:         5.80 cm   Diastology LVIDs:         3.90 cm   LV e' medial:    7.18 cm/s LV PW:         0.77 cm   LV E/e' medial:  13.8 LV IVS:        1.13 cm   LV e' lateral:   9.03 cm/s LVOT diam:     2.20 cm   LV E/e' lateral: 11.0 LV SV:         89 LV SV Index:   37 LVOT Area:     3.80 cm  LEFT ATRIUM           Index        RIGHT ATRIUM           Index LA diam:      5.40 cm 2.28 cm/m   RA Area:     15.70  cm LA Vol (A4C): 67.4 ml 28.43 ml/m  RA Volume:   41.10 ml  17.34 ml/m  AORTIC VALVE AV Area (Vmax):    2.42 cm AV Area (Vmean):   2.44 cm AV Area (VTI):     2.64 cm AV Vmax:           162.00 cm/s AV Vmean:          108.000 cm/s AV VTI:            0.335 m AV Peak Grad:      10.5 mmHg AV Mean Grad:      6.0 mmHg LVOT Vmax:         103.00 cm/s LVOT Vmean:        69.200 cm/s LVOT VTI:          0.233 m LVOT/AV VTI ratio: 0.70  AORTA Ao Root diam: 3.30 cm Ao Asc diam:  2.90 cm MITRAL VALVE MV Area (PHT): 4.49 cm     SHUNTS MV Decel Time: 169 msec     Systemic VTI:  0.23 m MV E velocity: 99.00 cm/s   Systemic Diam: 2.20 cm MV A velocity: 106.00 cm/s MV E/A ratio:  0.93 Mihai Croitoru MD Electronically signed by Thurmon Fair MD Signature Date/Time: 02/14/2022/2:06:09 PM  Final    DG Chest 2 View  Result Date: 02/14/2022 CLINICAL DATA:  Shortness of breath EXAM: CHEST - 2 VIEW COMPARISON:  10/15/2020 FINDINGS: Cardiac shadow is enlarged but stable. The lungs are well aerated bilaterally. Prominent central vascularity is again seen and stable. No edema is noted. No focal infiltrate is seen. Degenerative changes of the thoracic spine are noted. IMPRESSION: No acute abnormality noted. Electronically Signed   By: Alcide Clever M.D.   On: 02/14/2022 00:44   CT ABDOMEN PELVIS W CONTRAST  Result Date: 02/13/2022 CLINICAL DATA:  Abdominal infection EXAM: CT ABDOMEN AND PELVIS WITH CONTRAST TECHNIQUE: Multidetector CT imaging of the abdomen and pelvis was performed using the standard protocol following bolus administration of intravenous contrast. RADIATION DOSE REDUCTION: This exam was performed according to the departmental dose-optimization program which includes automated exposure control, adjustment of the mA and/or kV according to patient size and/or use of iterative reconstruction technique. CONTRAST:  OMNIPAQUE IOHEXOL 350 MG/ML SOLN COMPARISON:  06/03/2021 FINDINGS: Lower chest: Lung bases are clear.  Hepatobiliary: Liver is within normal limits. Status post cholecystectomy. No intrahepatic or extrahepatic ductal dilatation. Pancreas: Within normal limits. Spleen: Within normal limits. Adrenals/Urinary Tract: Adrenal glands are within normal limits. Kidneys are within normal limits.  No hydronephrosis. Bladder is underdistended with history contrast, within normal limits. Stomach/Bowel: Stomach is notable for a tiny hiatal hernia. No evidence of bowel obstruction. Appendix is not discretely visualized. No colonic wall thickening or inflammatory changes. Vascular/Lymphatic: No evidence of abdominal aortic aneurysm. No suspicious abdominopelvic lymphadenopathy. Reproductive: Status post hysterectomy. No adnexal masses. Other: No abdominopelvic ascites. Musculoskeletal: Mild subcutaneous stranding beneath the right mid anterior abdominal wall (series 3/image 40), possibly a subcutaneous injection site. Mild stranding along the lower anterior abdominal wall (series 8/image 30), chronic, favored to be postsurgical. No drainable fluid collection/abscess. Degenerative changes of the visualized thoracolumbar spine. IMPRESSION: No drainable fluid collection/abscess. No acute findings in the abdomen/pelvis. Electronically Signed   By: Charline Bills M.D.   On: 02/13/2022 20:54      Subjective: Patient seen and examined at the bedside today.  Hemodynamically stable for discharge  Discharge Exam: Vitals:   02/17/22 0549 02/17/22 0805  BP: 138/60 (!) 134/58  Pulse: (!) 50 (!) 54  Resp: 18 18  Temp: 98.2 F (36.8 C) 97.9 F (36.6 C)  SpO2: 95% 92%   Vitals:   02/16/22 1608 02/16/22 2121 02/17/22 0549 02/17/22 0805  BP: (!) 140/61 (!) 142/52 138/60 (!) 134/58  Pulse: (!) 51 (!) 59 (!) 50 (!) 54  Resp: Temp: 98.1 F (36.7 C) 98.1 F (36.7 C) 98.2 F (36.8 C) 97.9 F (36.6 C)  TempSrc: Oral Oral Oral Oral  SpO2: 98% 97% 95% 92%  Weight:      Height:        General: Pt is alert,  awake, not in acute distress, obese Cardiovascular: RRR, S1/S2 +, no rubs, no gallops Respiratory: CTA bilaterally, no wheezing, no rhonchi Abdominal: Soft, NT, ND, bowel sounds +, periumbilical wound covered with dressing Extremities: no edema, no cyanosis    The results of significant diagnostics from this hospitalization (including imaging, microbiology, ancillary and laboratory) are listed below for reference.     Microbiology: Recent Results (from the past 240 hour(s))  Blood culture (routine x 2)     Status: None (Preliminary result)   Collection Time: 02/13/22  8:16 PM   Specimen: BLOOD RIGHT FOREARM  Result Value Ref Range Status  Specimen Description BLOOD RIGHT FOREARM  Final   Special Requests   Final    BOTTLES DRAWN AEROBIC AND ANAEROBIC Blood Culture adequate volume   Culture   Final    NO GROWTH 4 DAYS Performed at [facility], [address] [place], [place] [address]    Report Status PENDING  Incomplete  Blood culture (routine x 2)     Status: None (Preliminary result)   Collection Time: 02/13/22  8:52 PM   Specimen: BLOOD  Result Value Ref Range Status   Specimen Description BLOOD SITE NOT SPECIFIED  Final   Special Requests   Final    BOTTLES DRAWN AEROBIC AND ANAEROBIC Blood Culture adequate volume   Culture   Final    NO GROWTH 4 DAYS Performed at [facility], [address] [place], [place] [address]    Report Status PENDING  Incomplete  MRSA Next Gen by PCR, Nasal     Status: None   Collection Time: 02/15/22  5:29 AM   Specimen: Nasal Mucosa; Nasal Swab  Result Value Ref Range Status   MRSA by PCR Next Gen NOT DETECTED NOT DETECTED Final    Comment: (NOTE) The GeneXpert MRSA Assay (FDA approved for NASAL specimens only), is one component of a comprehensive MRSA colonization surveillance program. It is not intended to diagnose MRSA infection nor to guide or monitor treatment for MRSA infections. Test performance is not FDA approved  in patients less than 21 years old. Performed at [facility], [place] [address], [place], [place] [address]      Labs: BNP (last 3 results) Recent Labs    02/14/22 0200  BNP 95.6   Basic Metabolic Panel: Recent Labs  Lab 02/13/22 1145 02/14/22 0200 02/15/22 0253  NA 138 137 140  K 3.4* 3.4* 3.6  CL 106 104 103  CO2 24 24 27   GLUCOSE 125* 232* 230*  BUN 8 6* 7*  CREATININE 0.82 0.74 0.87  CALCIUM 8.5* 8.2* 8.5*  MG  --  1.7 1.8   Liver Function Tests: No results for input(s): "AST", "ALT", "ALKPHOS", "BILITOT", "PROT", "ALBUMIN" in the last 168 hours. No results for input(s): "LIPASE", "AMYLASE" in the last 168 hours. No results for input(s): "AMMONIA" in the last 168 hours. CBC: Recent Labs  Lab 02/13/22 1145 02/14/22 0200 02/15/22 0253  WBC 6.6 5.7 6.6  NEUTROABS 4.2  --   --   HGB 13.5 12.8 12.6  HCT 40.3 38.4 37.8  MCV 86.5 86.1 86.5  PLT 299 304 310   Cardiac Enzymes: No results for input(s): "CKTOTAL", "CKMB", "CKMBINDEX", "TROPONINI" in the last 168 hours. BNP: Invalid input(s): "POCBNP" CBG: Recent Labs  Lab 02/16/22 1157 02/16/22 1613 02/16/22 2123 02/17/22 0803 02/17/22 1146  GLUCAP 321* 376* 306* 283* 326*   D-Dimer No results for input(s): "DDIMER" in the last 72 hours. Hgb A1c No results for input(s): "HGBA1C" in the last 72 hours. Lipid Profile No results for input(s): "CHOL", "HDL", "LDLCALC", "TRIG", "CHOLHDL", "LDLDIRECT" in the last 72 hours. Thyroid function studies No results for input(s): "TSH", "T4TOTAL", "T3FREE", "THYROIDAB" in the last 72 hours.  Invalid input(s): "FREET3" Anemia work up No results for input(s): "VITAMINB12", "FOLATE", "FERRITIN", "TIBC", "IRON", "RETICCTPCT" in the last 72 hours. Urinalysis    Component Value Date/Time   COLORURINE YELLOW 10/15/2020 [place] 10/15/2020 1541   LABSPEC 1.016 10/15/2020 1541   PHURINE 7.0 10/15/2020 1541   GLUCOSEU >=500 (A) 10/15/2020 1541  HGBUR NEGATIVE 10/15/2020 1541   BILIRUBINUR NEGATIVE 10/15/2020 1541   KETONESUR 20 (A) 10/15/2020 1541   PROTEINUR NEGATIVE 10/15/2020 1541   UROBILINOGEN 1.0 11/21/2014 1800   NITRITE NEGATIVE 10/15/2020 1541   LEUKOCYTESUR NEGATIVE 10/15/2020 1541   Sepsis Labs Recent Labs  Lab 02/13/22 1145 02/14/22 0200 02/15/22 0253  WBC 6.6 5.7 6.6   Microbiology Recent Results (from the past 240 hour(s))  Blood culture (routine x 2)     Status: None (Preliminary result)   Collection Time: 02/13/22  8:16 PM   Specimen: BLOOD RIGHT FOREARM  Result Value Ref Range Status   Specimen Description BLOOD RIGHT FOREARM  Final   Special Requests   Final    BOTTLES DRAWN AEROBIC AND ANAEROBIC Blood Culture adequate volume   Culture   Final    NO GROWTH 4 DAYS Performed at [facility], [address] [place], [place] [address]    Report Status PENDING  Incomplete  Blood culture (routine x 2)     Status: None (Preliminary result)   Collection Time: 02/13/22  8:52 PM   Specimen: BLOOD  Result Value Ref Range Status   Specimen Description BLOOD SITE NOT SPECIFIED  Final   Special Requests   Final    BOTTLES DRAWN AEROBIC AND ANAEROBIC Blood Culture adequate volume   Culture   Final    NO GROWTH 4 DAYS Performed at [facility], [address], [place], [place] [address]    Report Status PENDING  Incomplete  MRSA Next Gen by PCR, Nasal     Status: None   Collection Time: 02/15/22  5:29 AM   Specimen: Nasal Mucosa; Nasal Swab  Result Value Ref Range Status   MRSA by PCR Next Gen NOT DETECTED NOT DETECTED Final    Comment: (NOTE) The GeneXpert MRSA Assay (FDA approved for NASAL specimens only), is one component of a comprehensive MRSA colonization surveillance program. It is not intended to diagnose MRSA infection nor to guide or monitor treatment for MRSA infections. Test performance is not FDA approved in patients less than 56 years old. Performed at [facility], [address], [place], [place] [address]     Please note: You were cared for by a hospitalist during your hospital stay. Once you are discharged, your primary care physician will handle any further medical issues. Please note that NO REFILLS for any discharge medications will be authorized once you are discharged, as it is imperative that you return to your primary care physician (or establish a relationship with a primary care physician if you do not have one) for your post hospital discharge needs so that they can reassess your need for medications and monitor your lab values.    Time coordinating discharge: 40 minutes  SIGNED:   Burnadette Pop, MD  [facility] 02/17/2022, 11:57 AM Pager [phone]  If 7PM-7AM, please contact night-coverage [email or web address] Password [email or web address]

## 2022-02-17 NOTE — Progress Notes (Signed)
[facility] for Warfarin Indication: atrial fibrillation  Allergies  Allergen Reactions   Latex Other (See Comments)    Welts - cant wear them, but can be touched by someone who wears them.   Penicillins Hives and Itching    Tolerated ceftriaxone 10/2020 " facial swelling"    Patient Measurements: Height: 5\' 3"  (160 cm) Weight: (!) 146.5 kg (323 lb) IBW/kg (Calculated) : 52.4  Vital Signs: Temp: 97.9 F (36.6 C) (10/06 0805) Temp Source: Oral (10/06 0805) BP: 134/58 (10/06 0805) Pulse Rate: 54 (10/06 0805)  Labs: Recent Labs    02/14/22 0936 02/15/22 0253 02/16/22 0234 02/17/22 0211  HGB  --  12.6  --   --   HCT  --  37.8  --   --   PLT  --  310  --   --   LABPROT  --  25.3* 16.2* 14.6  INR  --  2.3* 1.3* 1.2  CREATININE  --  0.87  --   --   TROPONINIHS 6  --   --   --      Estimated Creatinine Clearance: 86.7 mL/min (by C-G formula based on SCr of 0.87 mg/dL).   Medical History: Past Medical History:  Diagnosis Date   Anxiety    CHF (congestive heart failure) (HCC)    Diabetes mellitus    Hypercholesteremia    Hypertension    Obesity    Seizures (HCC)     Medications:  Scheduled:   busPIRone  20 mg Oral TID   ezetimibe  10 mg Oral Daily   FLUoxetine  60 mg Oral Daily   fluticasone furoate-vilanterol  1 puff Inhalation Daily   furosemide  40 mg Oral Q M,W,F   furosemide  60 mg Oral Q T,Th,S,Su   hydrOXYzine  25 mg Oral TID   insulin regular human CONCENTRATED  25 Units Subcutaneous TID WC   loratadine  10 mg Oral Daily   losartan  50 mg Oral Daily   pantoprazole  40 mg Oral Daily   phenytoin  400 mg Oral Daily   polyethylene glycol  17 g Oral Daily   prazosin  2 mg Oral QHS   rosuvastatin  40 mg Oral Daily   sodium phosphate  1 enema Rectal Once   tiZANidine  8 mg Oral QHS   topiramate  200 mg Oral BID   traZODone  300 mg Oral QHS   Warfarin - Pharmacist Dosing Inpatient   Does not apply q1600     Assessment: 70 YOF presenting with abdominal wall cellulitis with abscess. History of atrial fibrillation on Warfarin at home. Warfarin has been on hold since admission to assess for I&D and surgical needs. Last dose given 10/2 per med history report. Pharmacy has been consulted to re-start warfarin prior to discharge.  Home regimen is 3.75mg  TTSS and 7.5mg  MWF. She received 5mg  PO Vit K on 10/3  INR 1.2 - Received 3.75mg  yesterday. No bleeding concerns.  Goal of Therapy:  INR 2-3 Monitor platelets by anticoagulation protocol: Yes   Plan:  Warfarin 7.5mg  PO once tonight Daily INR Monitor signs/symptoms of bleeding.  Recommend continuing home regimen on discharge  Merrilee Jansky, PharmD Clinical Pharmacist 02/17/2022,9:15 AM

## 2022-02-17 NOTE — Progress Notes (Signed)
Discharge instructions reviewed with pt.  Copy of instructions given to pt. Pt states she knows how to change her dsg, she has done it before. Supplies in room for pt to take home to use.  Pt verbalizes understanding of instructions.  Pt is waiting for daughter to arrive with her clothes to wear home. [facility]OC pharmacy to deliver meds to pt's room, if daughter arrives before meds are delivered will pick up meds as pt leaves.  Await daughter arrival.

## 2022-02-17 NOTE — Progress Notes (Signed)
Requesting enema, awaiting order.

## 2022-02-18 LAB — CULTURE, BLOOD (ROUTINE X 2)
Culture: NO GROWTH
Culture: NO GROWTH
Special Requests: ADEQUATE
Special Requests: ADEQUATE

## 2022-02-21 NOTE — Progress Notes (Deleted)
Cardiology Clinic Note   Patient Name: Donna Snow Date of Encounter: 02/21/2022  Primary Care Provider:  [facility], [facility] Primary Cardiologist:  None  Patient Profile    Donna Snow 71 year old female presents to the clinic today for follow-up evaluation of her paroxysmal atrial fibrillation with chronic diastolic CHF.  Past Medical History    Past Medical History:  Diagnosis Date   Anxiety    CHF (congestive heart failure) (HCC)    Diabetes mellitus    Hypercholesteremia    Hypertension    Obesity    Seizures (HCC)    Past Surgical History:  Procedure Laterality Date   ABDOMINAL HYSTERECTOMY     ABDOMINAL SURGERY     CHOLECYSTECTOMY     JOINT REPLACEMENT      Allergies  Allergies  Allergen Reactions   Latex Other (See Comments)    Welts - cant wear them, but can be touched by someone who wears them.   Penicillins Hives and Itching    Tolerated ceftriaxone 10/2020 " facial swelling"    History of Present Illness    Donna Snow has a PMH of essential hypertension, paroxysmal atrial fibrillation, chronic diastolic CHF, OSA, GERD, type 2 diabetes, hyperlipidemia, chronic blood pressure, and anxiety, cellulitis.   She was admitted to the hospital on 02/14/2022 and discharged on 02/17/2022 with abdominal wall cellulitis.  A CT of her abdomen showed no evidence of drainable fluid or abscess.  General surgery was consulted and is following.  She received IV antibiotics and was discharged in stable condition.  Cardiology was consulted and she was seen by Dr. Jens Som on 02/14/2022.  She had complaints of chest discomfort.  She reported a significant history of anxiety.  She reported feeling claustrophobic while at the emergency department.  She also noted left-sided chest pain while she was having her abdominal/pelvis CT.  She reference the tight band that was across to her chest.  She denied exertional symptoms.  Prior to admission she was limited in her  physical activity due to her weight and chronic back pain.  Her echocardiogram 6/22 showed an EF of 65-70%, G1 DD, moderate dilation of her left atria, mild dilation of the right atria and trivial mitral valve regurgitation.  It was felt that her chest discomfort was atypical.  Her at bedtime troponins were 5-6.  Her EKG showed no ischemic changes.  It was felt her symptoms were related to anxiety.  She presents to the clinic today for follow-up evaluation and states***  *** denies chest pain, shortness of breath, lower extremity edema, fatigue, palpitations, melena, hematuria, hemoptysis, diaphoresis, weakness, presyncope, syncope, orthopnea, and PND.  Atypical chest pain-denies further episodes of chest discomfort.  Slowly increasing physical activity and is returning to baseline.  During admission high-sensitivity troponins were negative.  EKG showed no ischemic changes.  Symptoms were felt to be related to anxiety in increased stress environment. No plans for ischemic evaluation at this time.  Paroxysmal atrial fibrillation-heart rate today***.  Reports compliance with warfarin and denies bleeding issues. Avoid triggers caffeine, chocolate, EtOH, dehydration etc.  Essential hypertension-BP today*** Continue losartan Heart healthy low-sodium diet-salty 6 given Increase physical activity as tolerated   HFpEF-no increased DOE or activity tolerance.  Echocardiogram 6/22 showed EF of 55-60 percent and G1 DD with no significant valvular abnormalities. Continue furosemide, losartan Heart healthy low-sodium diet-salty 6 given Increase physical activity as tolerated  Disposition: Follow-up with Dr. Jens Som in 4-6 months.  Home Medications    Prior  to Admission medications   Medication Sig Start Date End Date Taking? Authorizing Provider  albuterol (VENTOLIN HFA) 108 (90 Base) MCG/ACT inhaler Inhale 2 puffs into the lungs every 4 (four) hours as needed for shortness of breath. 06/06/20    [provider]  busPIRone (BUSPAR) 10 MG tablet Take 20 mg by mouth 3 (three) times daily. 08/16/20   [provider]  cefadroxil (DURICEF) 500 MG capsule Take 1 capsule (500 mg total) by mouth 2 (two) times daily for 7 days. 02/17/22 02/24/22  Burnadette Pop, MD  doxycycline (VIBRA-TABS) 100 MG tablet Take 1 tablet (100 mg total) by mouth 2 (two) times daily for 7 days. 02/17/22 02/24/22  Burnadette Pop, MD  ezetimibe (ZETIA) 10 MG tablet Take 1 tablet by mouth daily. 07/25/18   [provider]  FLUoxetine (PROZAC) 20 MG capsule Take 20 mg by mouth daily. Take in addition to the 40 mg capsule for a total of 60 mg daily. 01/03/22   [provider]  FLUoxetine (PROZAC) 40 MG capsule Take 40 mg by mouth daily. Take in addition to the 20 mg capsule for a total of 60 mg daily. 02/07/22   [provider]  fluticasone furoate-vilanterol (BREO ELLIPTA) 100-25 MCG/INH AEPB Inhale 1 puff into the lungs daily as needed (for shortness of breath). 03/08/20   [provider]  furosemide (LASIX) 40 MG tablet Take 40-60 mg by mouth See admin instructions. Take 40mg  by mouth once daily on Monday, Wednesday, Friday, then take 60mg  all other days 09/27/20   [provider]  hydrOXYzine (ATARAX) 25 MG tablet Take 25 mg by mouth 3 (three) times daily. 05/23/21   [provider]  Hyoscyamine Sulfate SL 0.125 MG SUBL Place 1 tablet under the tongue 3 (three) times daily as needed (stomach cramps). 08/23/18   [provider]  IBU 800 MG tablet Take 1 tablet (800 mg total) by mouth every 8 (eight) hours as needed for moderate pain. 10/22/20   Johnson, Clanford L, MD  insulin regular human CONCENTRATED (HUMULIN R) 500 UNIT/ML injection Inject 15-21 Units into the skin See admin instructions. Sliding scale 3 times daily    [provider]  loratadine (CLARITIN) 10 MG tablet Take 10 mg by mouth daily.    [provider]  losartan (COZAAR)  50 MG tablet Take 50 mg by mouth daily. 09/21/20   [provider]  omeprazole (PRILOSEC) 40 MG capsule Take 40 mg by mouth at bedtime. 06/05/20   [provider]  phenytoin (DILANTIN) 100 MG ER capsule Take 400 mg by mouth daily.    [provider]  prazosin (MINIPRESS) 2 MG capsule Take 2 mg by mouth at bedtime. 11/23/21   [provider]  rosuvastatin (CRESTOR) 40 MG tablet Take 1 tablet by mouth daily. 12/22/18   [provider]  Semaglutide,0.25 or 0.5MG /DOS, 2 MG/1.5ML SOPN Inject 0.25 mg into the skin every Friday. 05/20/21   [provider]  tiZANidine (ZANAFLEX) 4 MG tablet Take 8 mg by mouth at bedtime. 09/01/20   [provider]  topiramate (TOPAMAX) 100 MG tablet Take 200 mg by mouth 2 (two) times daily. 06/06/20   [provider]  traZODone (DESYREL) 150 MG tablet Take 300 mg by mouth at bedtime. 08/17/20   [provider]  warfarin (COUMADIN) 7.5 MG tablet Take 1 tablet (7.5 mg total) by mouth daily at 4 PM. Patient taking differently: Take 3.75-7.5 mg by mouth See admin instructions. 3.75 mg Tuesday,Thursday,Saturday and  Sunday  7.5 mg Monday,Wednesday and Friday 10/22/20 02/14/23  Irwin Brakeman L, MD  atorvastatin (LIPITOR) 40 MG tablet Take 40 mg by mouth daily.  10/21/20  [provider]  citalopram (CELEXA) 20 MG tablet Take by mouth. Patient not taking: Reported on 10/15/2020  10/21/20  [provider]  dicyclomine (BENTYL) 10 MG capsule Take by mouth. Patient not taking: Reported on 10/15/2020  10/21/20  [provider]  fexofenadine (ALLEGRA) 180 MG tablet Take 180 mg by mouth daily.    11/27/19  [provider]  glipiZIDE (GLUCOTROL) 10 MG tablet Take 10 mg by mouth 2 (two) times daily before a meal.    11/27/19  [provider]  lisinopril-hydrochlorothiazide (PRINZIDE,ZESTORETIC) 10-12.5 MG per tablet Take 1 tablet by mouth daily.   Patient not taking: No sig reported   10/21/20  [provider]  sitaGLIPtan-metformin (JANUMET) 50-1000 MG per tablet Take 1 tablet by mouth 2 (two) times daily with a meal.    11/27/19  [provider]  zolpidem (AMBIEN) 10 MG tablet Take 10 mg by mouth at bedtime as needed.    11/27/19  [provider]    Family History    Family History  Adopted: Yes   is adopted.   Social History    Social History   Socioeconomic History   Marital status: Widowed    Spouse name: Not on file   Number of children: Not on file   Years of education: Not on file   Highest education level: Not on file  Occupational History   Not on file  Tobacco Use   Smoking status: Never   Smokeless tobacco: Never  Vaping Use   Vaping Use: Never used  Substance and Sexual Activity   Alcohol use: No   Drug use: No   Sexual activity: Not on file  Other Topics Concern   Not on file  Social History Narrative   Not on file   Social Determinants of Health   Financial Resource Strain: Not on file  Food Insecurity: No Food Insecurity (02/15/2022)   Hunger Vital Sign    Worried About Running Out of Food in the Last Year: Never true    Ran Out of Food in the Last Year: Never true  Transportation Needs: No Transportation Needs (02/15/2022)   PRAPARE - Hydrologist (Medical): No    Lack of Transportation (Non-Medical): No  Physical Activity: Not on file  Stress: Not on file  Social Connections: Not on file  Intimate Partner Violence: Not At Risk (02/15/2022)   Humiliation, Afraid, Rape, and Kick questionnaire    Fear of Current or Ex-Partner: No    Emotionally Abused: No    Physically Abused: No    Sexually Abused: No     Review of Systems    General:  No chills, fever, night sweats or weight changes.  Cardiovascular:  No chest pain, dyspnea on exertion, edema, orthopnea, palpitations, paroxysmal nocturnal dyspnea. Dermatological: No rash, lesions/masses Respiratory: No cough,  dyspnea Urologic: No hematuria, dysuria Abdominal:   No nausea, vomiting, diarrhea, bright red blood per rectum, melena, or hematemesis Neurologic:  No visual changes, wkns, changes in mental status. All other systems reviewed and are otherwise negative except as noted above.  Physical Exam    VS:  There were no vitals taken for this visit. , BMI There is no height or weight on file to calculate BMI. GEN: Well nourished, well developed, in no acute distress.  HEENT: normal. Neck: Supple, no JVD, carotid bruits, or masses. Cardiac: RRR, no murmurs, rubs, or gallops. No clubbing, cyanosis, edema.  Radials/DP/PT 2+ and equal bilaterally.  Respiratory:  Respirations regular and unlabored, clear to auscultation bilaterally. GI: Soft, nontender, nondistended, BS + x 4. MS: no deformity or atrophy. Skin: warm and dry, no rash. Neuro:  Strength and sensation are intact. Psych: Normal affect.  Accessory Clinical Findings    Recent Labs: 06/03/2021: ALT 20 02/14/2022: B Natriuretic Peptide 22.0 02/15/2022: BUN 7; Creatinine, Ser 0.87; Hemoglobin 12.6; Magnesium 1.8; Platelets 310; Potassium 3.6; Sodium 140   Recent Lipid Panel No results found for: "CHOL", "TRIG", "HDL", "CHOLHDL", "VLDL", "LDLCALC", "LDLDIRECT"  No BP recorded.  {Refresh Note OR Click here to enter BP  :1}***    ECG personally reviewed by me today- *** - No acute changes  Echocardiogram 02/14/2022 IMPRESSIONS     1. Left ventricular ejection fraction, by estimation, is 55 to 60%. The  left ventricle has normal function. Left ventricular endocardial border  not optimally defined to evaluate regional wall motion. The left  ventricular internal cavity size was mildly  dilated. Left ventricular diastolic parameters are consistent with Grade I  diastolic dysfunction (impaired relaxation). Elevated left atrial  pressure.   2. Right ventricular systolic function is normal. The right ventricular  size is normal. Tricuspid  regurgitation signal is inadequate for assessing  PA pressure.   3. Left atrial size was severely dilated.   4. The mitral valve is normal in structure. Trivial mitral valve  regurgitation.   5. The aortic valve is tricuspid. There is mild calcification of the  aortic valve. Aortic valve regurgitation is not visualized. Aortic valve  sclerosis is present, with no evidence of aortic valve stenosis.   Comparison(s): No significant change from prior study. Prior images  reviewed side by side.   Assessment & Plan   1.  ***   Thomasene Ripple. Fran Mcree NP-C     02/21/2022, 12:53 PM [facility] [address] Office [phone] Fax [phone]  Notice: This dictation was prepared with Dragon dictation along with smaller phrase technology. Any transcriptional errors that result from this process are unintentional and may not be corrected upon review.  I spent***minutes examining this patient, reviewing medications, and using patient centered shared decision making involving her cardiac care.  Prior to her visit I spent greater than 20 minutes reviewing her past medical history,  medications, and prior cardiac tests.

## 2022-02-23 ENCOUNTER — Other Ambulatory Visit: Payer: Self-pay

## 2022-02-23 NOTE — Patient Outreach (Signed)
Aging Gracefully Program  02/23/2022  Donna Snow 09-Oct-1950 [id number]   [facility] Evaluation Interviewer made contact with patient. Aging Gracefully survey completed.   Interviewer will send referral to RN and OT for follow up.   [place] Management Assistant [phone]

## 2022-02-24 ENCOUNTER — Ambulatory Visit: Payer: Medicare HMO | Attending: General Practice | Admitting: General Practice

## 2022-03-23 ENCOUNTER — Other Ambulatory Visit: Payer: Self-pay | Admitting: Specialist

## 2022-03-27 NOTE — Patient Outreach (Signed)
Aging Gracefully Program  OT Initial Visit  03/27/2022  Donna Snow July 25, 1950 [id number]  Visit:  1- Initial Visit  Start Time:  1630 End Time:  1745 Total Minutes:  75  CCAP: Typical Daily Routine: Typical Daily Routine:: Patient is a 71 year old female with past medical history signifiant for STM loss, back pain, arthritis, seizures, anxiety, high bp, high cholesterol.  She lives alone and is quite isolated due to lack of mobility and lack of transportation.  Her daughter is scheduled to move in with her in early December. What Types Of Care Problems Are You Having Throughout The Day?: unable to care for her home, her pets (2 dogs and 1 cat, and difficulty completing IADLs and lower extremity dressing. is not able to care for her pets.  unable to get to MD appointments. What Kind Of Help Do You Receive?: neighbors bring her mail to her Do You Think You Need Other Types Of Help?: yes - assistance getting to medical appts, caring for her home, and completing IADLs What Do You Think Would Make Everyday Life Easier For You?: less back pain so that she could get around the house, help in driving her hoverround, a car What Is A Good Day Like?: less back pain, visitors What Is A Bad Day Like?: more pain and isolation Do You Have Time For Yourself?: yes Patient Reported Equipment: Patient Reported Equipment Currently Used: Single [employer]:: temporary ramp recently installed  by [employer] Functional Mobility-Walking Indoors/Getting Around the [employer]:   Functional Mobility-Walk A Block: Walk A Block: Unable To Do Functional Mobility-Maintain Balance While Showering: Maintaining Balance While Showering: Unable To Do Do You:: No Device/No Assistance Importance Of Learning New Strategies:: Very Much Observation: Maintain Balance While Showering: Moderate Assistance Safety: Extreme Risk Efficiency: Not At All Intervention: Yes Functional Mobility-Stooping, Crouching, Kneeling  To Retreive Item: Stooping, Crouching, or Kneeling To Retrieve Item: Unable To Do Do You:: No Device/No Assistance Importance Of Learning New Strategies:: Very Much Observation: Gayland Curry, or Kneel: Total Assistance Safety: Extreme Risk Efficiency: Not At All Intervention: Yes Functional Mobility-Bending From Standing Position To Pick Up Clothing Off The Floor: Bending Over From Standing Position To Pick Up Clothing Off The Floor: Unable To Do Do You:: No Device/No Assistance Importance Of Learning New Strategies:: Very Much Observation: Bending Over From Standing Postion To Pick Up Clothing Off Of Floor: Total Assistance Safety: Extreme Risk Efficiency: Not At All Intervention: Yes Functional Mobility-Reaching For Items Above Shoulder Level: Reaching For Items Above Shoulder Level: A Little Difficulty Do You:: No Device/No Assistance Importance Of Learning New Strategies:: A Little Functional Mobility-Climb 1 Flight Of Stairs: Climb 1 Flight Of Stairs: A Lot Of Difficulty Do You:: No Device/No Assistance Importance Of Learning New Strategies:: Very Much Functional Mobility-Move In And Out Of Chair: Move In and Out Of A Chair: No Difficulty Do You:: No Device/No Assistance Importance Of Learning New Strategies:: Not At All Functional Mobility-Move In And Out Of Bed: Move In and Out Of Bed: N/A (sleeps in recliner) Functional Mobility-Move In And Out Of Bath/Shower: Move In And Out Of A Bath/Shower: A Lot Of Difficulty Do You:: No Device/No Assistance Importance Of Learning New Strategies:: Very Much Other Comments:: likes to use garden tub, falls getting in and out of tub often.  is open to a shower versus a tub Observation: Move In And Out Of Bath/Shower: Total Assistance Safety: Extreme Risk Efficiency: Not At All Intervention: Yes Functional Mobility-Get On And Off Toilet:  Getting Up From The Floor: Unable To Do Do You:: No Device/No Assistance Importance Of Learning  New Strategies:: Very Much Observation: Getting Up From The Floor: N/O Functional Mobility-Into And Out Of Car, Not Including Driving: Into  And Out Of Car, Not Including Driving: N/A Functional Mobility-Other Mobility Difficulty:      Activities of Daily Living-Bathing/Showering: ADL-Bathing/Showering: A Little Difficulty Do You:: No Device/No Assistance Importance Of Learning New Strategies: Not At All Activities of Daily Living-Personal Hygiene and Grooming: Personal Hygiene and Grooming: No Difficulty Do You:: No Device/No Assistance Importance Of Learning New Strategies: Not At All Activities of Daily Living-Toilet Hygiene: Toilet Hygiene: A Little Difficulty Do You:: No Device/No Assistance Importance Of Learning New Strategies: Not At All Activities of Daily Living-Put On And Take Off Undergarments (Incl. Fasteners): Put On And Take Off Undergarments (Incl. Fasteners): A Lot of Difficulty Do You:: No Device/No Assistance Importance Of Learning New Strategies: Very Much Activities of Daily Living-Put On And Take Off Shirt/Dress/Coat (Incl. Fasteners): Put On And Take Off Shirt/Dress/Coat (Incl. Fasteners): No Difficulty Do You:: No Device/No Assistance Importance Of Learning New Strategies: Not At All Activities of Daily Living-Put On And Take Off Socks And Shoes: Put On And Take Off Socks And  Shoes: Unable To Do Do You:: No Device/No Assistance Importance Of Learning New Strategies: Very Much Activities of Daily Living-Feed Self: Feed Self: No Difficulty Activities of Daily Living-Rest And Sleep: Rest and Sleep: No Difficulty Activities of Daily Living-Sexual Activity: Sexual  Activity: N/A Activities of Daily Living-Other Activity Identified:    Instrumental Activities of Daily Living-Light Homemaking (Laundry, Straightening Up, Vacuuming):  Do Light Homemaking (Laundry, Straightening Up, Vacuuming): Unable To Do Do You:: No Device/No Assistance Importance Of  Learning New Strategies: Very Much IADL Observation: Do Light Homemaking (Laundry, Straightening Up, Vacuuming): Minimal Assistance Safety: Extreme Risk Instrumental Activities of Daily Living-Making A Bed: Making a Bed: N/A Instrumental Activities of Daily Living-Washing Dishes By Hand While Standing At The Sink: Washing Dishes By Hand While Standing At The Sink: A Little Difficulty Do You:: Use A Device Importance Of Learning New Strategies: Not At All Instrumental Activities of Daily Living-Grocery Shopping: Do Grocery Shopping: A Little Difficulty (orders on line, hard to bring inside) Instrumental Activities of Daily Living-Use Telephone: Use Telephone: No Difficulty Instrumental Activities of Daily Living-Financial Management: Financial Management: No Difficulty Instrumental Activities of Daily Living-Medications: Take Medications: A Little Difficulty Instrumental Activities of Daily Living-Health Management And Maintenance: Health Management & Maintenance: A Lot of Difficulty (doesnt have transportation to appts) Do You:: No Device/No Assistance Instrumental Activities of Daily Living-Meal Preparation and Clean-Up: Meal Preparation and Clean-Up: A Little Difficulty Do You:: No Device/No Assistance Importance Of Learning New Strategies: Not At All Instrumental Activities of Daily Living-Provide Care For Others/Pets: Care For Others/Pets: Moderate Difficulty (animals voiding on carpet unable to clean up) Do You:: No Device/No Assistance Importance Of Learning New Strategies: A Little Instrumental Activities of Daily Living-Take Part In Organized Social Activities: Take Part In Organized Social Activities: Unable To Do Instrumental Activities of Daily Living-Leisure Participation: Leisure Participation: Unable To Do Instrumental Activities of Daily Living-Employment/Volunteer Activities: Employment/Volunteer Activities: Unable To Do Instrumental Activities of Daily Living-Other  Identifies:    Readiness To Change Score:  Readiness to Change Score: 6  Home Environment Assessment: Outside Home Entry:: temporary ramp newly installed - steps are still used to take trash out and these are rotting Entryway/Foyer:: somewhat cluttered Dining Room:: somewhat cluttered Living Room:: somewhat cluttered Kitchen:: wfl Stairs::  n/a Bathroom:: master has garden tub that patient would like to get in and out of, however I am not certain this is feasible.  commode may be lower than comfort level and will look at replacing and adding a grab bar at commode.  hall bath has traditional tub/shower combo - would benefit from removing and replacing with walk in shower due to commode too close to shower for ttb.  commode could be raised to comfort level. Master Bedroom:: sleeps in Haematologist:: wfl Basement:: n/a Hallways:: somewhat cluttered Smoke/CO2 Detector:: smoke detector in kitchen does not function, patient states other smoke detectors in the home are functional. Scientist, forensic:: not present Mailbox:: unable to walk to [employer] as it is in front of neighborhood.  neighbor retrieves mail for patient and brings to her front door. Other Home Environment Concerns:: animal waste on floor throughout home, significant amount of clutter throughout home making walking or maneuvering hoverround difficult.  Durable Medical Equipment:    Patient Education: Education Provided: Yes Education Details: check for safety brochure for tips to avoid falls and improve safety in her home Person(s) Educated: Patient Comprehension: Verbalized Understanding  Goals:  Goals Addressed             This Visit's Progress    Patient Stated       Patient will improve safety with shower and commode transfers.      Patient Stated       Patient will improve independence and safety with home mobility using her hover round.     Patient Stated       Patient will improve safety retrieving items  off of the floor.      Patient Stated       Patient will improve independence with lower body dressing.         Post Clinical Reasoning: Clinician View Of Client Situation:: Patient is depressed and isolated in her home, due to her limited mobility and lack of transportation.  Her daughter is scheduled to move back to this area after Thanksgiving and I am hopeful that this will improve her mood and her ability to get out into the community.  Activity level reported versus actual current state are most likely not in alignment.  SHe has 2 dogs and 1 cat, which are the joy of her life.  She is not able to clean up after these pets and has animal waste throughout the home. Client View Of His/Her Situation:: Patient has been admittedly depressed since the death of her husband several years ago.  Due to her weight and back pain, she is not able to get around and does not know how to drive her hover round wheelchair.  She has 2 dogs and 1 cat, which are the joy of her life.  She is open to modifications to improve the safety of her home. Next Visit Plan:: Modification planning with [employer] and Problem solve goal 1 - improving independence picking items up off of the floor.   [name], MHA, OT/L [phone]

## 2022-03-29 ENCOUNTER — Telehealth: Payer: Self-pay

## 2022-03-29 NOTE — Patient Outreach (Signed)
Aging Gracefully Program  03/29/2022  Donna Snow 07-21-1950 [id number]   Placed call to patient to schedule first RN home visit. Offered appointment for 04/04/2022 and patient accepted. Confirmed address.  Rowe Pavy RN, BSN, Careers adviser for [employer] Mobile: [phone]

## 2022-04-04 ENCOUNTER — Other Ambulatory Visit: Payer: Self-pay

## 2022-04-05 NOTE — Patient Instructions (Signed)
Visit Information  Thank you for taking time to visit with me today. Please don't hesitate to contact me if I can be of assistance to you before our next scheduled in person  appointment.  Following are the goals we discussed today:   Goals Addressed               This Visit's Progress     Aging Gracefully (pt-stated)        Goal: Patient will report decrease in back pain in the next 120 days.  04/04/2022 Assessment: Patient complains of severe back pain with limits her ADLs and IDALS.  Reports she has not been able to go to the pain clinic due to her daughter having her car.  Daughter is coming back to [place] today and will take patient to the pain clinic.   Patient has a hooverround that she uses sometimes in her home but was waiting for daughter to come home before she is able to use it on the temp ramp.  Interventions:Brainstorming exercises performed during home visit.  encouraged patient to go back to the pain clinic. Reviewed heat and cold therapy. Encouraged patient to be as active as she can.  Reviewed with patient at the next home visit we would work on a home exercise plan.   Plan: next home visit planned for 05/02/2022  Rowe Pavy RN, BSN, CEN RN Case Manager for Aging Gracefully [facility] Mobile: [phone]          Our next appointment is  in person  on 05/02/2022 at 0930  Please call the care guide team at [phone] if you need to cancel or reschedule your appointment.   If you are experiencing a Mental Health or Behavioral Health Crisis or need someone to talk to, please call the Suicide and Crisis Lifeline: 988 call the [place] National Suicide Prevention Lifeline: 9567247653 or TTY: (786)075-8489 TTY 973-383-0424) to talk to a trained counselor call 1-800-273-TALK (toll free, 24 hour hotline) go to [facility] [address] [place] [phone]) call 911   The patient verbalized understanding of  instructions, educational materials, and care plan provided today and agreed to receive a mailed copy of patient instructions, educational materials, and care plan.   Rowe Pavy RN, BSN, Careers adviser for [employer] Mobile: [phone]

## 2022-04-05 NOTE — Patient Outreach (Signed)
Aging Gracefully Program  RN Visit  04/05/2022  Donna Snow Oct 16, 1950 [id number]  Visit:  RN Visit Number: 1- Initial Visit  RN TIME CALCULATION: Start TIme:  RN Start Time Calculation: 0910 End Time:  RN Stop Time Calculation: 1020 Total Minutes:  RN Time Calculation: 70  Readiness To Change Score:     Universal RN Interventions: Calendar Distribution: Yes Exercise Review: No Medications: Yes Medication Changes: No Mood: Yes Pain: Yes PCP Advocacy/Support: No Fall Prevention: Yes Incontinence: Yes Clinician View Of Client Situation: Home cluttered.  3 dogs and 1 cat in the home. Noted temporary ramp. Patient with diffuclty walking. Client View Of His/Her Situation: Patient reports that she manages her medications well. Patient reports she goes to the [facility] but has not been in a while due to lack of transportation. Patient reports that her daughter has had her car in [place].  Patient reports that she does not leave the house much due to anxiety. Has a therapist that she talks to monthly.  Reports she is having financial difficulty. Reports that she is no longer getting an inhertiance that she was getting monthly. She does not know why.  Patient report limited food in the home but reports that her daughter is coming in this afternoon and her ex son in law is bring over groceries.  Reports she thinks this is ok right now.  Patient reports her biggest problem is her back pain and mobillity.  reports she has just gotten a hooverround but is not confortable using it on the ramp until her daughter gets home to help her.  Healthcare Provider Communication: Did Surveyor, mining With [employer] Provider?: No According to Client, Did PCP Report Communication With An Aging Gracefully RN?: No  Clinician View of Client Situation: Diplomatic Services operational officer Of Client Situation: Home cluttered.  3 dogs and 1 cat in the home. Noted temporary ramp. Patient with diffuclty  walking. Client's View of His/Her Situation: Client View Of His/Her Situation: Patient reports that she manages her medications well. Patient reports she goes to the [facility] but has not been in a while due to lack of transportation. Patient reports that her daughter has had her car in [place].  Patient reports that she does not leave the house much due to anxiety. Has a therapist that she talks to monthly.  Reports she is having financial difficulty. Reports that she is no longer getting an inhertiance that she was getting monthly. She does not know why.  Patient report limited food in the home but reports that her daughter is coming in this afternoon and her ex son in law is bring over groceries.  Reports she thinks this is ok right now.  Patient reports her biggest problem is her back pain and mobillity.  reports she has just gotten a hooverround but is not confortable using it on the ramp until her daughter gets home to help her.  Medication Assessment: Do You Have Any Problems Paying For Medications?: Yes Where Does Client Store Medications?:  (living room) Can Client Read Pill Bottles?: No Does Client Use A Pillbox?: Yes Does Anyone Assist Client In Filling Pillbox?: No Does Anyone Assist Client In Taking Medications?: No Do You Take Vitamin D?: No Does Client Have Any Questions Or Concerns About Medictions?: No Is Client Complaining Of Any Symptoms That Could Be Side Effects To Medications?: No Any Possible Changes In Medication Regimen?: No   Outpatient Encounter Medications as of 04/04/2022  Medication Sig   albuterol (  VENTOLIN HFA) 108 (90 Base) MCG/ACT inhaler Inhale 2 puffs into the lungs every 4 (four) hours as needed for shortness of breath.   busPIRone (BUSPAR) 10 MG tablet Take 20 mg by mouth 3 (three) times daily.   ezetimibe (ZETIA) 10 MG tablet Take 1 tablet by mouth daily.   FLUoxetine (PROZAC) 20 MG capsule Take 20 mg by mouth daily. Take in addition to the 40  mg capsule for a total of 60 mg daily.   FLUoxetine (PROZAC) 40 MG capsule Take 40 mg by mouth daily. Take in addition to the 20 mg capsule for a total of 60 mg daily.   fluticasone furoate-vilanterol (BREO ELLIPTA) 100-25 MCG/INH AEPB Inhale 1 puff into the lungs daily as needed (for shortness of breath).   furosemide (LASIX) 40 MG tablet Take 40-60 mg by mouth See admin instructions. Take 40mg  by mouth once daily on Monday, Wednesday, Friday, then take 60mg  all other days   hydrOXYzine (ATARAX) 25 MG tablet Take 25 mg by mouth 3 (three) times daily.   IBU 800 MG tablet Take 1 tablet (800 mg total) by mouth every 8 (eight) hours as needed for moderate pain.   insulin regular human CONCENTRATED (HUMULIN R) 500 UNIT/ML injection Inject 15-21 Units into the skin See admin instructions. Sliding scale 3 times daily   loratadine (CLARITIN) 10 MG tablet Take 10 mg by mouth daily.   losartan (COZAAR) 50 MG tablet Take 50 mg by mouth daily.   omeprazole (PRILOSEC) 40 MG capsule Take 40 mg by mouth at bedtime.   phenytoin (DILANTIN) 100 MG ER capsule Take 400 mg by mouth daily.   polyethylene glycol powder (GLYCOLAX/MIRALAX) 17 GM/SCOOP powder Take 1 Container by mouth once. Takes 1 scoop per day   prazosin (MINIPRESS) 2 MG capsule Take 2 mg by mouth at bedtime.   rosuvastatin (CRESTOR) 40 MG tablet Take 1 tablet by mouth daily.   Semaglutide,0.25 or 0.5MG /DOS, 2 MG/1.5ML SOPN Inject 0.25 mg into the skin every Friday.   topiramate (TOPAMAX) 100 MG tablet Take 200 mg by mouth 2 (two) times daily.   traZODone (DESYREL) 150 MG tablet Take 300 mg by mouth at bedtime.   warfarin (COUMADIN) 7.5 MG tablet Take 1 tablet (7.5 mg total) by mouth daily at 4 PM. (Patient taking differently: Take 3.75-7.5 mg by mouth See admin instructions. 3.75 mg Tuesday,Thursday,Saturday and Sunday  7.5 mg Monday,Wednesday and Friday)   Hyoscyamine Sulfate SL 0.125 MG SUBL Place 1 tablet under the tongue 3 (three) times daily as  needed (stomach cramps). (Patient not taking: Reported on 04/04/2022)   tiZANidine (ZANAFLEX) 4 MG tablet Take 8 mg by mouth at bedtime. (Patient not taking: Reported on 04/04/2022)   [DISCONTINUED] atorvastatin (LIPITOR) 40 MG tablet Take 40 mg by mouth daily.   [DISCONTINUED] citalopram (CELEXA) 20 MG tablet Take by mouth. (Patient not taking: Reported on 10/15/2020)   [DISCONTINUED] dicyclomine (BENTYL) 10 MG capsule Take by mouth. (Patient not taking: Reported on 10/15/2020)   [DISCONTINUED] fexofenadine (ALLEGRA) 180 MG tablet Take 180 mg by mouth daily.     [DISCONTINUED] glipiZIDE (GLUCOTROL) 10 MG tablet Take 10 mg by mouth 2 (two) times daily before a meal.     [DISCONTINUED] lisinopril-hydrochlorothiazide (PRINZIDE,ZESTORETIC) 10-12.5 MG per tablet Take 1 tablet by mouth daily.   (Patient not taking: No sig reported)   [DISCONTINUED] sitaGLIPtan-metformin (JANUMET) 50-1000 MG per tablet Take 1 tablet by mouth 2 (two) times daily with a meal.     [DISCONTINUED] zolpidem (AMBIEN)  10 MG tablet Take 10 mg by mouth at bedtime as needed.     No facility-administered encounter medications on file as of 04/04/2022.    OT Update: pending community housing solutions assessment  Session Summary: Client with many needs socially.  She is addressing them. I encouraged patient to call me if she needs assistance before our next scheduled home visit.    Goals Addressed               This Visit's Progress     Aging Gracefully (pt-stated)        Goal: Patient will report decrease in back pain in the next 120 days.  04/04/2022 Assessment: Patient complains of severe back pain with limits her ADLs and IDALS.  Reports she has not been able to go to the pain clinic due to her daughter having her car.  Daughter is coming back to [place] today and will take patient to the pain clinic.   Patient has a hooverround that she uses sometimes in her home but was waiting for daughter to come home before she is able to  use it on the temp ramp.  Interventions: encouraged patient to go back to the pain clinic. Reviewed heat and cold therapy. Encouraged patient to be as active as she can.  Reviewed with patient at the next home visit we would work on a home exercise plan.   Plan: next home visit planned for 05/02/2022  Rowe PavyAmanda Anayah Arvanitis RN, BSN, CEN RN Case Manager for Aging Gracefully [facility] Mobile: [phone]        CLIENT/RN ACTION PLAN - PAIN  Registered Nurse Rowe PavyAmanda Ulus Hazen   Date:  04/04/2022  Client Name: Donna Snow Client ID:    Target Area:  PAIN   Why Problem May Occur: Severe degenerative disc disease   Target Goal: Patient will report decreased back pain in the next 120 days.  STRATEGIES Coping Strategies Ideas:  Heat  04/04/2022  reviewed Use heating pad or warm towel on painful area no more than 20 minutes at a time. Don't sleep with a heating pad on - it could burn your skin   Ice  04/04/2022  reviewed Use ice pack or frozen bag of vegetables on painful area.   Leave cold pack on for less than 20 minutes. Ice can burn your skin.  Don't leave ice on longer than 20 minutes.   Activity and Exercise  05/02/2022 will plan to review home exercise plan Joints get stiff when not in use Aging Gracefully Exercises Walking (inside or outside) [employer]  Gardening Housework:  cooking, cleaning, Building surveyorlaundry Swimming   Listen to Music Listening to music can decrease pain. Turn off the TV and turn on the radio.   Prayer/Meditation Prayer and meditation can decrease pain   Other   Acetaminophen/Tylenol (same medication)  04/04/2022  reviewed DO NOT take more Acetaminophen than below because it can be bad for your liver. 500 mg. tablets:  2 tablets every 8 hours, as needed for pain.  Do not take more than 6, (500 mg) tablets every day. 300 mg. tablets:  2 tablets every 6 hours, as needed for pain.  Do not take more than 8 (325 mg) tablets every day. Look for  Acetaminophen/Tylenol in other medicines you buy over the counter.   Still in Pain  04/04/2022  reviewed There ae a lot of different kinds of pain medicines:  creams, patches and supplements. Ask your Healthcare Provider about other pain medications.   Stop  Smoking        NA Smoking can make arthritis worse.   Stop Pain Before it gets bad. Once in pain It is harder to get rid of. Begin pain relief while you have mild pain.   Other Call the pain clinic and make an appointment.   Other    ;  PRACTICE It is important to practice the strategies so we can determine if they will be effective in helping to reach the goal.    Follow these specific recommendations:        If strategy does not work the first time, try it again.  We may make some changes over the next few sessions.    We may make some changes over the next few sessions, based on how they work.   Rowe Pavy RN, BSN, Careers adviser for [employer] Mobile: [phone]

## 2022-04-18 ENCOUNTER — Other Ambulatory Visit: Payer: Self-pay | Admitting: Specialist

## 2022-04-18 NOTE — Patient Outreach (Signed)
Aging Gracefully Program  OT Follow-Up Visit  04/18/2022  Keymoni Mccaster [birth year] [id number]  Visit:  2- Second Visit  Start Time:  1630 End Time:  1725 Total Minutes:  55  Durable Medical Equipment: Adaptive Equipment: Reacher Adaptive Equipment Distribution Date: 04/18/22  Patient Education: Education Provided: Yes Education Details: Educated patient on how to safely get up from a fall. Person(s) Educated: Patient Comprehension: Verbalized Understanding  Goals:   Goals Addressed             This Visit's Progress    Patient Stated   On track    Patient will improve safety retrieving items off of the floor.  ACTION PLANNING - CUSTOM  Target Problem Area:  difficulty picking items up from floor.   Why Problem May Occur: Back pain Decreased balance Decreased grip Difficulty bending over  Target Goal: Patient will be able to pick up items from floor with confidence and without straining her back.   STRATEGIES Saving Your Energy: DO: DON'T:  Use reacher to pick up items from floor, overhead. Over reach in front of body or to sides of body.              Modifying your home environment and making it safe: DO: DON'T:                 Simplifying the way you set up tasks or daily routines: DO: DON'T:            Practice It is important to practice the strategies so we can determine if they will be effective in helping to reach your goal. Follow these specific recommendations:  Use reacher to pick up items up off floor. 2.  Use reacher to retrieve items from overhead cabinets. 3.  Do not over reach to your front or sides.  If a strategy does not work the first time, try it again and again (and maybe again). We may make some changes over the next few sessions, based on how they work.   [name], MHA, OT/L [phone]           Post Clinical Reasoning: Client Action (Goal) One Interventions: Patient will be able to retrieve items  from floor with increased safety- brainstormed causes of difficulty with picking up items from floor, discussed possible solutions.  Patient prefers use of reacher, OT instructed patient on use of reacher. Did Client Try?: Yes Targeted Problem Area Status: A Lot Better Clinician View Of Client Situation:: Ms. Matuska daughter and son in law now living with her.  She comments that she has fallen in tub several times.  She is in better spirits.  Continuing to have transportation issues to get to MD visit - encouraged her to reach out to her insurance company for possible assistance. Client View Of His/Her Situation:: Doing ok, will be doing much better if we can get shower modifications complete.  This session Gene Brown from [facility] present to review options for tub/shower conversion.  Due to mobile home, options are limited.  OT to research ttb that could be used in shower if glass doors removed.  Discussed replacing commodes with chair height commodes.  OT to follow up with Gene Manson Passey regarding tub transfer options. Next Visit Plan:: Problem solve goal 2 - lower extremity dressing and goal 3 - functional mobility in home.

## 2022-05-02 ENCOUNTER — Other Ambulatory Visit: Payer: Self-pay

## 2022-05-02 NOTE — Patient Outreach (Signed)
Aging Gracefully Program  RN Visit  05/02/2022  Donna Snow 12/29/1950 [id number]  Visit:  RN Visit Number: 2- Second Visit  RN TIME CALCULATION: Start TIme:  RN Start Time Calculation: 0944 End Time:  RN Stop Time Calculation: 1030 Total Minutes:  RN Time Calculation: 46  Readiness To Change Score:     Universal RN Interventions: Calendar Distribution: Yes Exercise Review: Yes Medications: Yes Medication Changes: No Mood: Yes Pain: Yes PCP Advocacy/Support: No Fall Prevention: Yes Incontinence: Yes Clinician View Of Client Situation: Daughter present.  Patient forgot appointment.  Home cluttered.  Ambulating to door without difficulty Client View Of His/Her Situation: Patient reports Patineshe had a death in hr family.  Offered to reschedule. . patient declined.  Patinet reports that her car is in the shop.  reports recent visit with therapist.  reports she has an appointment with pain clinic.  Healthcare Provider Communication: Did Surveyor, mining With [employer] Provider?: No According to Client, Did PCP Report Communication With An Aging Gracefully RN?: No  Clinician View of Client Situation: Clinician View Of Client Situation: Daughter present.  Patient forgot appointment.  Home cluttered.  Ambulating to door without difficulty Client's View of His/Her Situation: Client View Of His/Her Situation: Patient reports Patineshe had a death in hr family.  Offered to reschedule. . patient declined.  Patinet reports that her car is in the shop.  reports recent visit with therapist.  reports she has an appointment with pain clinic.  Medication Assessment: denies any changes to medications    OT Update: pending Community housing modifications  Session Summary: Patient doing well.  Daughter at home now to assist patient.   Goals Addressed               This Visit's Progress     Aging Gracefully (pt-stated)        Goal: Patient will report decrease in back pain in  the next 120 days.  04/04/2022 Assessment: Patient complains of severe back pain with limits her ADLs and IDALS.  Reports she has not been able to go to the pain clinic due to her daughter having her car.  Daughter is coming back to [place] today and will take patient to the pain clinic.   Patient has a hooverround that she uses sometimes in her home but was waiting for daughter to come home before she is able to use it on the temp ramp.  Interventions:Brainstorming exercises performed during home visit.  encouraged patient to go back to the pain clinic. Reviewed heat and cold therapy. Encouraged patient to be as active as she can.  Reviewed with patient at the next home visit we would work on a home exercise plan.   Plan: next home visit planned for 05/02/2022  Rowe Pavy RN, BSN, CEN RN Case Manager for Aging Gracefully [facility] Mobile: [phone]   05/02/2022  Assessment:  Continue to have back pain.  No falls.  Ambulatory to the door.  Interventions:  Provided home exercise plan and reviewed.  Reviewed fall prevention. Plan: follow up 05/30/2022  Rowe Pavy RN, BSN, CEN RN Case Manager for Aging [employer] Mobile: [phone]

## 2022-05-02 NOTE — Patient Instructions (Signed)
Visit Information  Thank you for taking time to visit with me today. Please don't hesitate to contact me if I can be of assistance to you before our next scheduled telephone appointment.    Following are the goals we discussed today:   Goals Addressed               This Visit's Progress     Aging Gracefully (pt-stated)        Goal: Patient will report decrease in back pain in the next 120 days.  04/04/2022 Assessment: Patient complains of severe back pain with limits her ADLs and IDALS.  Reports she has not been able to go to the pain clinic due to her daughter having her car.  Daughter is coming back to [place] today and will take patient to the pain clinic.   Patient has a hooverround that she uses sometimes in her home but was waiting for daughter to come home before she is able to use it on the temp ramp.  Interventions:Brainstorming exercises performed during home visit.  encouraged patient to go back to the pain clinic. Reviewed heat and cold therapy. Encouraged patient to be as active as she can.  Reviewed with patient at the next home visit we would work on a home exercise plan.   Plan: next home visit planned for 05/02/2022  Rowe Pavy RN, BSN, CEN RN Case Manager for Aging Gracefully [facility] Mobile: [phone]   05/02/2022  Assessment:  Continue to have back pain.  No falls.  Ambulatory to the door.  Interventions:  Provided home exercise plan and reviewed.  Reviewed fall prevention. Plan: follow up 05/30/2022  Rowe Pavy RN, BSN, CEN RN Case Manager for Aging Gracefully [facility] Mobile: [phone]              Our next appointment is on 05/30/2022 at 10am  If you are experiencing a Mental Health or Behavioral Health Crisis or need someone to talk to, please call the Suicide and Crisis Lifeline: 988 call the [place] National Suicide Prevention Lifeline: (820)548-0467 or TTY: (218) 684-0125 TTY 904-531-6774) to talk to a  trained counselor call 1-800-273-TALK (toll free, 24 hour hotline) go to [facility] [address] [place] [phone]) call 911   The patient verbalized understanding of instructions, educational materials, and care plan provided today and agreed to receive a mailed copy of patient instructions, educational materials, and care plan.   Rowe Pavy RN, BSN, Careers adviser for [employer] Mobile: [phone]

## 2022-05-10 ENCOUNTER — Encounter (HOSPITAL_COMMUNITY): Payer: Self-pay | Admitting: *Deleted

## 2022-05-10 ENCOUNTER — Other Ambulatory Visit: Payer: Self-pay

## 2022-05-10 ENCOUNTER — Inpatient Hospital Stay (HOSPITAL_COMMUNITY)
Admission: EM | Admit: 2022-05-10 | Discharge: 2022-05-12 | DRG: 291 | Disposition: A | Discharge status: Home Health | Payer: Medicare HMO | Attending: Family Medicine | Admitting: Family Medicine

## 2022-05-10 ENCOUNTER — Emergency Department (HOSPITAL_COMMUNITY): Payer: Medicare HMO

## 2022-05-10 DIAGNOSIS — E46 Unspecified protein-calorie malnutrition: Secondary | ICD-10-CM | POA: Diagnosis present

## 2022-05-10 DIAGNOSIS — Z8241 Family history of sudden cardiac death: Secondary | ICD-10-CM

## 2022-05-10 DIAGNOSIS — G8929 Other chronic pain: Secondary | ICD-10-CM | POA: Diagnosis present

## 2022-05-10 DIAGNOSIS — Z90710 Acquired absence of both cervix and uterus: Secondary | ICD-10-CM

## 2022-05-10 DIAGNOSIS — E1165 Type 2 diabetes mellitus with hyperglycemia: Secondary | ICD-10-CM | POA: Diagnosis present

## 2022-05-10 DIAGNOSIS — L03116 Cellulitis of left lower limb: Secondary | ICD-10-CM | POA: Diagnosis present

## 2022-05-10 DIAGNOSIS — Z9049 Acquired absence of other specified parts of digestive tract: Secondary | ICD-10-CM

## 2022-05-10 DIAGNOSIS — Z7984 Long term (current) use of oral hypoglycemic drugs: Secondary | ICD-10-CM | POA: Diagnosis not present

## 2022-05-10 DIAGNOSIS — F329 Major depressive disorder, single episode, unspecified: Secondary | ICD-10-CM | POA: Diagnosis present

## 2022-05-10 DIAGNOSIS — Z1152 Encounter for screening for COVID-19: Secondary | ICD-10-CM

## 2022-05-10 DIAGNOSIS — Z79899 Other long term (current) drug therapy: Secondary | ICD-10-CM | POA: Diagnosis not present

## 2022-05-10 DIAGNOSIS — I480 Paroxysmal atrial fibrillation: Secondary | ICD-10-CM | POA: Diagnosis present

## 2022-05-10 DIAGNOSIS — I5030 Unspecified diastolic (congestive) heart failure: Secondary | ICD-10-CM | POA: Diagnosis present

## 2022-05-10 DIAGNOSIS — Z794 Long term (current) use of insulin: Secondary | ICD-10-CM

## 2022-05-10 DIAGNOSIS — I10 Essential (primary) hypertension: Secondary | ICD-10-CM | POA: Diagnosis present

## 2022-05-10 DIAGNOSIS — Z8673 Personal history of transient ischemic attack (TIA), and cerebral infarction without residual deficits: Secondary | ICD-10-CM

## 2022-05-10 DIAGNOSIS — Z7901 Long term (current) use of anticoagulants: Secondary | ICD-10-CM | POA: Diagnosis not present

## 2022-05-10 DIAGNOSIS — I5043 Acute on chronic combined systolic (congestive) and diastolic (congestive) heart failure: Secondary | ICD-10-CM | POA: Diagnosis not present

## 2022-05-10 DIAGNOSIS — G40909 Epilepsy, unspecified, not intractable, without status epilepticus: Secondary | ICD-10-CM | POA: Diagnosis present

## 2022-05-10 DIAGNOSIS — K219 Gastro-esophageal reflux disease without esophagitis: Secondary | ICD-10-CM | POA: Diagnosis present

## 2022-05-10 DIAGNOSIS — G4710 Hypersomnia, unspecified: Secondary | ICD-10-CM | POA: Diagnosis present

## 2022-05-10 DIAGNOSIS — L0390 Cellulitis, unspecified: Secondary | ICD-10-CM | POA: Diagnosis present

## 2022-05-10 DIAGNOSIS — E662 Morbid (severe) obesity with alveolar hypoventilation: Secondary | ICD-10-CM | POA: Diagnosis present

## 2022-05-10 DIAGNOSIS — F411 Generalized anxiety disorder: Secondary | ICD-10-CM | POA: Diagnosis present

## 2022-05-10 DIAGNOSIS — R519 Headache, unspecified: Secondary | ICD-10-CM | POA: Diagnosis present

## 2022-05-10 DIAGNOSIS — E8809 Other disorders of plasma-protein metabolism, not elsewhere classified: Secondary | ICD-10-CM | POA: Diagnosis present

## 2022-05-10 DIAGNOSIS — Z6841 Body Mass Index (BMI) 40.0 and over, adult: Secondary | ICD-10-CM | POA: Diagnosis not present

## 2022-05-10 DIAGNOSIS — E785 Hyperlipidemia, unspecified: Secondary | ICD-10-CM | POA: Diagnosis present

## 2022-05-10 DIAGNOSIS — R569 Unspecified convulsions: Secondary | ICD-10-CM

## 2022-05-10 DIAGNOSIS — Z880 Allergy status to penicillin: Secondary | ICD-10-CM

## 2022-05-10 DIAGNOSIS — J440 Chronic obstructive pulmonary disease with acute lower respiratory infection: Secondary | ICD-10-CM | POA: Diagnosis present

## 2022-05-10 DIAGNOSIS — J209 Acute bronchitis, unspecified: Secondary | ICD-10-CM | POA: Diagnosis present

## 2022-05-10 DIAGNOSIS — I5033 Acute on chronic diastolic (congestive) heart failure: Secondary | ICD-10-CM | POA: Diagnosis present

## 2022-05-10 DIAGNOSIS — Z91040 Latex allergy status: Secondary | ICD-10-CM | POA: Diagnosis not present

## 2022-05-10 DIAGNOSIS — G4733 Obstructive sleep apnea (adult) (pediatric): Secondary | ICD-10-CM | POA: Diagnosis present

## 2022-05-10 DIAGNOSIS — R0602 Shortness of breath: Secondary | ICD-10-CM | POA: Diagnosis present

## 2022-05-10 DIAGNOSIS — M549 Dorsalgia, unspecified: Secondary | ICD-10-CM | POA: Diagnosis present

## 2022-05-10 DIAGNOSIS — E7800 Pure hypercholesterolemia, unspecified: Secondary | ICD-10-CM | POA: Diagnosis present

## 2022-05-10 DIAGNOSIS — E782 Mixed hyperlipidemia: Secondary | ICD-10-CM | POA: Diagnosis not present

## 2022-05-10 DIAGNOSIS — I110 Hypertensive heart disease with heart failure: Principal | ICD-10-CM | POA: Diagnosis present

## 2022-05-10 LAB — PROTIME-INR
INR: 1.2 (ref 0.8–1.2)
Prothrombin Time: 15 seconds (ref 11.4–15.2)

## 2022-05-10 LAB — CBC
HCT: 40.8 % (ref 36.0–46.0)
Hemoglobin: 13 g/dL (ref 12.0–15.0)
MCH: 27.7 pg (ref 26.0–34.0)
MCHC: 31.9 g/dL (ref 30.0–36.0)
MCV: 86.8 fL (ref 80.0–100.0)
Platelets: 258 10*3/uL (ref 150–400)
RBC: 4.7 MIL/uL (ref 3.87–5.11)
RDW: 13.2 % (ref 11.5–15.5)
WBC: 9.1 10*3/uL (ref 4.0–10.5)
nRBC: 0 % (ref 0.0–0.2)

## 2022-05-10 LAB — BASIC METABOLIC PANEL
Anion gap: 6 (ref 5–15)
BUN: 11 mg/dL (ref 8–23)
CO2: 29 mmol/L (ref 22–32)
Calcium: 8.7 mg/dL — ABNORMAL LOW (ref 8.9–10.3)
Chloride: 104 mmol/L (ref 98–111)
Creatinine, Ser: 0.66 mg/dL (ref 0.44–1.00)
GFR, Estimated: >60 mL/min (ref >60)
Glucose, Bld: 205 mg/dL — ABNORMAL HIGH (ref 70–99)
Potassium: 4.1 mmol/L (ref 3.5–5.1)
Sodium: 139 mmol/L (ref 135–145)

## 2022-05-10 LAB — RESP PANEL BY RT-PCR (RSV, FLU A&B, COVID)  RVPGX2
Influenza A by PCR: NEGATIVE
Influenza B by PCR: NEGATIVE
Resp Syncytial Virus by PCR: NEGATIVE
SARS Coronavirus 2 by RT PCR: NEGATIVE

## 2022-05-10 LAB — APTT: aPTT: 28 seconds (ref 24–36)

## 2022-05-10 LAB — D-DIMER, QUANTITATIVE: D-Dimer, Quant: <0.27 ug/mL-FEU (ref 0.00–0.50)

## 2022-05-10 LAB — CBG MONITORING, ED
Glucose-Capillary: 197 mg/dL — ABNORMAL HIGH (ref 70–99)
Glucose-Capillary: 291 mg/dL — ABNORMAL HIGH (ref 70–99)

## 2022-05-10 MED ORDER — BISACODYL 10 MG RE SUPP: 10.0000 mg | Freq: Every day | RECTAL | Status: DC | PRN

## 2022-05-10 MED ORDER — BENZONATATE 100 MG PO CAPS
100.0000 mg | ORAL_CAPSULE | Freq: Three times a day (TID) | ORAL | Status: DC
  Administered 2022-05-10 – 2022-05-12 (×6): 100 mg via ORAL
  Filled 2022-05-10 (×6): qty 1

## 2022-05-10 MED ORDER — PHENYTOIN SODIUM EXTENDED 100 MG PO CAPS
400.0000 mg | ORAL_CAPSULE | Freq: Every day | ORAL | Status: DC
  Administered 2022-05-10 – 2022-05-12 (×3): 400 mg via ORAL
  Filled 2022-05-10 (×3): qty 4

## 2022-05-10 MED ORDER — HYDROXYZINE HCL 25 MG PO TABS
50.0000 mg | ORAL_TABLET | Freq: Two times a day (BID) | ORAL | Status: DC
  Administered 2022-05-10 – 2022-05-12 (×3): 50 mg via ORAL
  Filled 2022-05-10 (×4): qty 2

## 2022-05-10 MED ORDER — SODIUM CHLORIDE 0.9% FLUSH: 3.0000 mL | INTRAVENOUS | Status: DC | PRN

## 2022-05-10 MED ORDER — LOSARTAN POTASSIUM 50 MG PO TABS
50.0000 mg | ORAL_TABLET | Freq: Every day | ORAL | Status: DC
  Administered 2022-05-10 – 2022-05-11 (×2): 50 mg via ORAL
  Filled 2022-05-10 (×2): qty 1

## 2022-05-10 MED ORDER — ROSUVASTATIN CALCIUM 20 MG PO TABS
40.0000 mg | ORAL_TABLET | Freq: Every day | ORAL | Status: DC
  Administered 2022-05-10 – 2022-05-12 (×3): 40 mg via ORAL
  Filled 2022-05-10 (×3): qty 2

## 2022-05-10 MED ORDER — ONDANSETRON HCL 4 MG PO TABS: 4.0000 mg | ORAL_TABLET | Freq: Four times a day (QID) | ORAL | Status: DC | PRN

## 2022-05-10 MED ORDER — FUROSEMIDE 10 MG/ML IJ SOLN
60.0000 mg | Freq: Once | INTRAMUSCULAR | Status: AC
  Administered 2022-05-10: 60 mg via INTRAVENOUS
  Filled 2022-05-10: qty 6

## 2022-05-10 MED ORDER — INSULIN ASPART 100 UNIT/ML IJ SOLN
0.0000 [IU] | Freq: Every day | INTRAMUSCULAR | Status: DC
  Administered 2022-05-11 (×2): 3 [IU] via SUBCUTANEOUS

## 2022-05-10 MED ORDER — ACETAMINOPHEN 325 MG PO TABS: 650.0000 mg | ORAL_TABLET | ORAL | Status: DC | PRN

## 2022-05-10 MED ORDER — ACETAMINOPHEN 500 MG PO TABS
1000.0000 mg | ORAL_TABLET | Freq: Once | ORAL | Status: AC
  Administered 2022-05-10: 1000 mg via ORAL
  Filled 2022-05-10: qty 2

## 2022-05-10 MED ORDER — BUSPIRONE HCL 10 MG PO TABS
20.0000 mg | ORAL_TABLET | Freq: Three times a day (TID) | ORAL | Status: DC
  Administered 2022-05-10 – 2022-05-12 (×6): 20 mg via ORAL
  Filled 2022-05-10 (×6): qty 2

## 2022-05-10 MED ORDER — ONDANSETRON HCL 4 MG/2ML IJ SOLN: 4.0000 mg | Freq: Four times a day (QID) | INTRAMUSCULAR | Status: DC | PRN

## 2022-05-10 MED ORDER — SEMAGLUTIDE(0.25 OR 0.5MG/DOS) 2 MG/1.5ML ~~LOC~~ SOPN: 0.2500 mg | PEN_INJECTOR | SUBCUTANEOUS | Status: DC

## 2022-05-10 MED ORDER — PRAZOSIN HCL 2 MG PO CAPS
2.0000 mg | ORAL_CAPSULE | Freq: Every day | ORAL | Status: DC
  Administered 2022-05-11 (×2): 2 mg via ORAL
  Filled 2022-05-10 (×4): qty 1

## 2022-05-10 MED ORDER — WARFARIN SODIUM 10 MG PO TABS
10.0000 mg | ORAL_TABLET | Freq: Once | ORAL | Status: AC
  Administered 2022-05-11: 10 mg via ORAL
  Filled 2022-05-10: qty 1

## 2022-05-10 MED ORDER — FUROSEMIDE 10 MG/ML IJ SOLN
40.0000 mg | Freq: Two times a day (BID) | INTRAMUSCULAR | Status: DC
  Administered 2022-05-11 (×2): 40 mg via INTRAVENOUS
  Filled 2022-05-10 (×3): qty 4

## 2022-05-10 MED ORDER — EZETIMIBE 10 MG PO TABS
10.0000 mg | ORAL_TABLET | Freq: Every day | ORAL | Status: DC
  Administered 2022-05-11 – 2022-05-12 (×2): 10 mg via ORAL
  Filled 2022-05-10 (×3): qty 1

## 2022-05-10 MED ORDER — INSULIN ASPART 100 UNIT/ML IJ SOLN
0.0000 [IU] | Freq: Three times a day (TID) | INTRAMUSCULAR | Status: DC
  Administered 2022-05-11: 8 [IU] via SUBCUTANEOUS
  Administered 2022-05-11: 11 [IU] via SUBCUTANEOUS
  Administered 2022-05-11: 8 [IU] via SUBCUTANEOUS
  Administered 2022-05-12: 15 [IU] via SUBCUTANEOUS

## 2022-05-10 MED ORDER — SODIUM CHLORIDE 0.9 % IV SOLN: 250.0000 mL | INTRAVENOUS | Status: DC | PRN

## 2022-05-10 MED ORDER — TOPIRAMATE 25 MG PO TABS
200.0000 mg | ORAL_TABLET | Freq: Two times a day (BID) | ORAL | Status: DC
  Administered 2022-05-10 – 2022-05-12 (×4): 200 mg via ORAL
  Filled 2022-05-10 (×6): qty 8

## 2022-05-10 MED ORDER — CLINDAMYCIN HCL 300 MG PO CAPS
300.0000 mg | ORAL_CAPSULE | Freq: Four times a day (QID) | ORAL | Status: DC
  Administered 2022-05-11 – 2022-05-12 (×8): 300 mg via ORAL
  Filled 2022-05-10: qty 1
  Filled 2022-05-10: qty 2
  Filled 2022-05-10 (×2): qty 1
  Filled 2022-05-10: qty 2
  Filled 2022-05-10 (×2): qty 1
  Filled 2022-05-10: qty 2
  Filled 2022-05-10 (×2): qty 1

## 2022-05-10 MED ORDER — FUROSEMIDE 10 MG/ML IJ SOLN: 40.0000 mg | Freq: Two times a day (BID) | INTRAMUSCULAR | Status: DC

## 2022-05-10 MED ORDER — WARFARIN - PHARMACIST DOSING INPATIENT: Freq: Every day | Status: DC

## 2022-05-10 MED ORDER — GUAIFENESIN 100 MG/5ML PO LIQD
10.0000 mL | ORAL | Status: DC | PRN
  Filled 2022-05-10: qty 10

## 2022-05-10 MED ORDER — SODIUM CHLORIDE 0.9% FLUSH
3.0000 mL | Freq: Two times a day (BID) | INTRAVENOUS | Status: DC
  Administered 2022-05-11 – 2022-05-12 (×3): 3 mL via INTRAVENOUS

## 2022-05-10 NOTE — ED Provider Notes (Signed)
[facility] EMERGENCY DEPARTMENT Provider Note   CSN: [id number] Arrival date & time: 05/10/22  1118     History  Chief Complaint  Patient presents with   Shortness of Breath    Donna Snow is a 71 y.o. female with HTN, HLD, GERD, T2DM, chronic Back pain, paroxysmal A-fib on Coumadin, morbid obesity, OSA, epilepsy, chronic diastolic heart failure (LVEF 55 to 60% 02/14/2022) GAD, MDD, OA who presents with shortness of breath.   Pt complains of gradually worsening shortness of breath x 1 week. Severe cough that is not productive, chest pain with coughing.  Cough and shortness of breath are both worsened by laying down, patient sleeps in a reclining chair, this is not new for her but is worsened over the last few days. Also endorses bilateral lower extremity worsening over the last few days. Feels like she's had a fever, but had not checked her temperature. Pt on warfarin, has not taken meds including Lasix or Coumadin for the past 2 days due to feeling ill.  Wonders if she has the flu.  Has had rhinorrhea and generalized mild headache, generalized body aches, with no sore throat, N/V/D/C.    Shortness of Breath      Home Medications Prior to Admission medications   Medication Sig Start Date End Date Taking? Authorizing Provider  albuterol (VENTOLIN HFA) 108 (90 Base) MCG/ACT inhaler Inhale 2 puffs into the lungs every 4 (four) hours as needed for shortness of breath. 06/06/20   [provider]  busPIRone (BUSPAR) 10 MG tablet Take 20 mg by mouth 3 (three) times daily. 08/16/20   [provider]  ezetimibe (ZETIA) 10 MG tablet Take 1 tablet by mouth daily. 07/25/18   [provider]  FLUoxetine (PROZAC) 20 MG capsule Take 20 mg by mouth daily. Take in addition to the 40 mg capsule for a total of 60 mg daily. 01/03/22   [provider]  FLUoxetine (PROZAC) 40 MG capsule Take 40 mg by mouth daily. Take in addition to the 20 mg capsule for a  total of 60 mg daily. 02/07/22   [provider]  fluticasone furoate-vilanterol (BREO ELLIPTA) 100-25 MCG/INH AEPB Inhale 1 puff into the lungs daily as needed (for shortness of breath). 03/08/20   [provider]  furosemide (LASIX) 40 MG tablet Take 40-60 mg by mouth See admin instructions. Take 40mg  by mouth once daily on Monday, Wednesday, Friday, then take 60mg  all other days 09/27/20   [provider]  hydrOXYzine (ATARAX) 25 MG tablet Take 25 mg by mouth 3 (three) times daily. 05/23/21   [provider]  Hyoscyamine Sulfate SL 0.125 MG SUBL Place 1 tablet under the tongue 3 (three) times daily as needed (stomach cramps). Patient not taking: Reported on 04/04/2022 08/23/18   [provider]  IBU 800 MG tablet Take 1 tablet (800 mg total) by mouth every 8 (eight) hours as needed for moderate pain. 10/22/20   Johnson, Clanford L, MD  insulin regular human CONCENTRATED (HUMULIN R) 500 UNIT/ML injection Inject 15-21 Units into the skin See admin instructions. Sliding scale 3 times daily    [provider]  loratadine (CLARITIN) 10 MG tablet Take 10 mg by mouth daily.    [provider]  losartan (COZAAR) 50 MG tablet Take 50 mg by mouth daily. 09/21/20   [provider]  omeprazole (PRILOSEC) 40 MG capsule Take 40 mg by mouth at bedtime. 06/05/20   [provider]  phenytoin (DILANTIN)  100 MG ER capsule Take 400 mg by mouth daily.    [provider]  polyethylene glycol powder (GLYCOLAX/MIRALAX) 17 GM/SCOOP powder Take 1 Container by mouth once. Takes 1 scoop per day    [provider]  prazosin (MINIPRESS) 2 MG capsule Take 2 mg by mouth at bedtime. 11/23/21   [provider]  rosuvastatin (CRESTOR) 40 MG tablet Take 1 tablet by mouth daily. 12/22/18   [provider]  Semaglutide,0.25 or 0.5MG /DOS, 2 MG/1.5ML SOPN Inject 0.25 mg into the skin every Friday. 05/20/21   [provider]   tiZANidine (ZANAFLEX) 4 MG tablet Take 8 mg by mouth at bedtime. Patient not taking: Reported on 04/04/2022 09/01/20   [provider]  topiramate (TOPAMAX) 100 MG tablet Take 200 mg by mouth 2 (two) times daily. 06/06/20   [provider]  traZODone (DESYREL) 150 MG tablet Take 300 mg by mouth at bedtime. 08/17/20   [provider]  warfarin (COUMADIN) 7.5 MG tablet Take 1 tablet (7.5 mg total) by mouth daily at 4 PM. Patient taking differently: Take 3.75-7.5 mg by mouth See admin instructions. 3.75 mg Tuesday,Thursday,Saturday and Sunday  7.5 mg Monday,Wednesday and Friday 10/22/20 02/14/23  Irwin Brakeman L, MD  atorvastatin (LIPITOR) 40 MG tablet Take 40 mg by mouth daily.  10/21/20  [provider]  citalopram (CELEXA) 20 MG tablet Take by mouth. Patient not taking: Reported on 10/15/2020  10/21/20  [provider]  dicyclomine (BENTYL) 10 MG capsule Take by mouth. Patient not taking: Reported on 10/15/2020  10/21/20  [provider]  fexofenadine (ALLEGRA) 180 MG tablet Take 180 mg by mouth daily.    11/27/19  [provider]  glipiZIDE (GLUCOTROL) 10 MG tablet Take 10 mg by mouth 2 (two) times daily before a meal.    11/27/19  [provider]  lisinopril-hydrochlorothiazide (PRINZIDE,ZESTORETIC) 10-12.5 MG per tablet Take 1 tablet by mouth daily.   Patient not taking: No sig reported  10/21/20  [provider]  sitaGLIPtan-metformin (JANUMET) 50-1000 MG per tablet Take 1 tablet by mouth 2 (two) times daily with a meal.    11/27/19  [provider]  zolpidem (AMBIEN) 10 MG tablet Take 10 mg by mouth at bedtime as needed.    11/27/19  [provider]      Allergies    Latex and Penicillins    Review of Systems   Review of Systems  Respiratory:  Positive for shortness of breath.    Review of systems Positive for f/c.  A 10 point review of systems was performed and is negative unless otherwise reported in  HPI.  Physical Exam Updated Vital Signs BP (!) 159/62 (BP Location: Left Arm)   Pulse (!) 58   Temp 97.9 F (36.6 C) (Oral)   Resp (!) 22   SpO2 96%  Physical Exam General: Normal appearing female, lying in bed.  HEENT: PERRLA, Sclera anicteric, MMM, trachea midline.  Cardiology: RRR, no murmurs/rubs/gallops. BL radial and DP pulses equal bilaterally.  Resp: Normal respiratory rate and effort. CTAB, no wheezes, rhonchi, crackles.  Abd: Soft, non-tender, non-distended. No rebound tenderness or guarding.  GU: Deferred. MSK: No peripheral edema or signs of trauma. Extremities without deformity or TTP. No cyanosis or clubbing. Skin: warm, dry. No rashes or lesions. Back: No CVA tenderness Neuro: A&Ox4, CNs II-XII grossly intact. MAEs. Sensation grossly intact.  Psych: Normal mood and affect.   ED Results / Procedures / Treatments   Labs (all labs  ordered are listed, but only abnormal results are displayed) Labs Reviewed  BASIC METABOLIC PANEL - Abnormal; Notable for the following components:      Result Value   Glucose, Bld 205 (*)    Calcium 8.7 (*)    All other components within normal limits  CBG MONITORING, ED - Abnormal; Notable for the following components:   Glucose-Capillary 197 (*)    All other components within normal limits  RESP PANEL BY RT-PCR (RSV, FLU A&B, COVID)  RVPGX2  CBC  PROTIME-INR  APTT  D-DIMER, QUANTITATIVE  BRAIN NATRIURETIC PEPTIDE    EKG EKG Interpretation  Date/Time:  Wednesday May 10 2022 13:22:07 EST Ventricular Rate:  65 PR Interval:  178 QRS Duration: 80 QT Interval:  430 QTC Calculation: 447 R Axis:   22 Text Interpretation: Normal sinus rhythm Low voltage QRS Borderline ECG When compared with ECG of 13-Feb-2022 20:57, PREVIOUS ECG IS PRESENT Confirmed by Cindee Lame [phone]) on 05/10/2022 7:53:38 PM  Radiology DG Chest 2 View  Result Date: 05/10/2022 CLINICAL DATA:  Shortness of breath, cough and congestion. EXAM: CHEST -  2 VIEW COMPARISON:  02/14/2022 and CT chest 10/17/2020. FINDINGS: Trachea is midline. Right-sided aortic arch. Heart is enlarged. Lungs are low in volume with interstitial prominence and indistinctness bilaterally. No pleural fluid. Degenerative changes in the spine. IMPRESSION: Pulmonary edema. Electronically Signed   By: Lorin Picket M.D.   On: 05/10/2022 13:49    Procedures Procedures    Medications Ordered in ED Medications  furosemide (LASIX) injection 60 mg (has no administration in time range)    ED Course/ Medical Decision Making/ A&P                          Medical Decision Making Amount and/or Complexity of Data Reviewed Labs: ordered. Decision-making details documented in ED Course. Radiology: ordered. Decision-making details documented in ED Course.  Risk OTC drugs. Prescription drug management. Decision regarding hospitalization.   This patient presents to the ED for concern of SOB, this involves an extensive number of treatment options, and is a complaint that carries with it a high risk of complications and morbidity.  I considered the following differential and admission for this acute, potentially life threatening condition.   MDM:    DDX for dyspnea includes but is not limited to:  Cardiac- CHF, Myocardial Ischemia, Valvular heart disease, Arrhythmia; patient with increased LEE, orthopnea/cough, and DOE likely w/ HF exacerbation. Will obtain CXR and BNP to confirm. Patient has no murmurs on exam and per recent echo had no aortic stenosis. No CP when not coughing, unlikely to have MI, but will eval w/ EKG.  Respiratory - Pneumonia / atelectasis / pulmonary effusion, PE; no CP to suggest PE and BL LEE is symmetric with no calf tenderness; d-dimer ordered from triage. No wheezin to suggest COPD/asthma.  Other - Sepsis, Anemia  - afebrile, not tachycardic, unlikely to be sepsis.    Clinical Course as of 05/10/22 1948  Wed May 10, 2022  1726 D-Dimer, Quant:  <0.27 [HN]  1726 WBC: 9.1 [HN]  1726 Hemoglobin: 13.0 [HN]  1726 Resp panel by RT-PCR (RSV, Flu A&B, Covid) Anterior Nasal Swab Neg [HN]  1726 DG Chest 2 View FINDINGS: Trachea is midline. Right-sided aortic arch. Heart is enlarged. Lungs are low in volume with interstitial prominence and indistinctness bilaterally. No pleural fluid. Degenerative changes in the spine.  IMPRESSION: Pulmonary edema.   [HN]  1930 Patient ambulated to restroom.  When she came back with was very short of breath and visibly labored breathing. Placed on 1L [place] and now satting 94%. Will admit patient for CHF exacerbation. S/p 60 mg IV lasix.  [HN]    Clinical Course User Index [HN] Loetta Rough, MD     Labs: I Ordered, and personally interpreted labs.  The pertinent results include:  those listed above  Imaging Studies ordered: I ordered imaging studies including CXR I independently visualized and interpreted imaging. I agree with the radiologist interpretation  Additional history obtained from chart review.   Cardiac Monitoring: The patient was maintained on a cardiac monitor.  I personally viewed and interpreted the cardiac monitored which showed an underlying rhythm of: NSr  Reevaluation: After the interventions noted above, I reevaluated the patient and found that they have :stayed the same  Social Determinants of Health: Patient lives independently   Disposition:  admit to medicine  Co morbidities that complicate the patient evaluation  Past Medical History:  Diagnosis Date   Anxiety    CHF (congestive heart failure) (HCC)    Diabetes mellitus    Hypercholesteremia    Hypertension    Obesity    Seizures (HCC)    Stroke (HCC)      Medicines Meds ordered this encounter  Medications   furosemide (LASIX) injection 60 mg    I have reviewed the patients home medicines and have made adjustments as needed  Problem List / ED Course: Problem List Items Addressed This Visit    None Visit Diagnoses     Acute on chronic diastolic congestive heart failure (HCC)    -  Primary   Relevant Medications   furosemide (LASIX) injection 60 mg (Completed)                 This note was created using dictation software, which may contain spelling or grammatical errors.    Loetta Rough, MD 05/10/22 [phone]

## 2022-05-10 NOTE — Progress Notes (Signed)
ANTICOAGULATION CONSULT NOTE - Initial Consult  Pharmacy Consult for warfarin Indication: atrial fibrillation  Allergies  Allergen Reactions   Latex Other (See Comments)    Welts - cant wear them, but can be touched by someone who wears them.   Penicillins Hives and Itching    Tolerated ceftriaxone 10/2020 " facial swelling"    Patient Measurements:    Vital Signs: Temp: 99 F (37.2 C) (12/27 1908) Temp Source: Oral (12/27 1908) BP: 128/69 (12/27 2030) Pulse Rate: 65 (12/27 2030)  Labs: Recent Labs    05/10/22 1505  HGB 13.0  HCT 40.8  PLT 258  APTT 28  LABPROT 15.0  INR 1.2  CREATININE 0.66    CrCl cannot be calculated (Unknown ideal weight.).   Medical History: Past Medical History:  Diagnosis Date   Anxiety    CHF (congestive heart failure) (HCC)    Diabetes mellitus    Hypercholesteremia    Hypertension    Obesity    Seizures (HCC)    Stroke [facility])     Assessment: 79 YOF presenting with SOB/cough, hx of afib on warfarin PTA but she says she has been unable to take the last 2 days, INR on admission is subtherapeutic at 1.2  PTA dosing: 7.5mg  daily, except 3.75mg  TTS  Goal of Therapy:  INR 2-3 Monitor platelets by anticoagulation protocol: Yes   Plan:  Warfarin 10 mg PO x 1 Daily INR, s/s bleeding   Daylene Posey, PharmD, [facility] Clinical Pharmacist ED Pharmacist Phone # [phone] 05/10/2022 8:39 PM

## 2022-05-10 NOTE — H&P (Incomplete)
PCP:   [facility]   Chief Complaint: Breathing issues   HPI: This is a 71 year old female with past medical history of morbid obesity, diabetes mellitus, congestive heart failure, diastolic, hypertension, history of stroke with no residual deficits and seizures.  Patient presents to the ER with complaint of severe dry nonproductive cough that gets so bad she has to gasp for air.  She feels as if her heart stops and she can't breath.  She feels weak, her side hurts and she has been up all night unable sleep because of the cough.  She was finally unable to take it and came to the ER today.  She reports chills but no fevers.  She has not taken any of her medications today because she was coming to the ER.  In the ER patient's workup reveals congestive heart failure.  The patient remains on room air.  Hospitalist have been asked admit.  He is provided by the patient was alert and oriented  Review of Systems:  The patient denies anorexia, fever, weight loss,, vision loss, decreased hearing, hoarseness, chest pain, syncope, dyspnea on exertion, peripheral edema, balance deficits, hemoptysis, abdominal pain, melena, hematochezia, severe indigestion/heartburn, hematuria, incontinence, genital sores, muscle weakness, suspicious skin lesions, transient blindness, difficulty walking, depression, unusual weight change, abnormal bleeding, enlarged lymph nodes, angioedema, and breast masses. Positives: Shortness of breath, cough, wheeze  Past Medical History: Past Medical History:  Diagnosis Date   Anxiety    CHF (congestive heart failure) (HCC)    Diabetes mellitus    Hypercholesteremia    Hypertension    Obesity    Seizures (HCC)    Stroke [facility])    Past Surgical History:  Procedure Laterality Date   ABDOMINAL HYSTERECTOMY     ABDOMINAL SURGERY     CHOLECYSTECTOMY     JOINT REPLACEMENT      Medications: Prior to Admission medications   Medication Sig Start Date End Date  Taking? Authorizing Provider  albuterol (VENTOLIN HFA) 108 (90 Base) MCG/ACT inhaler Inhale 2 puffs into the lungs every 4 (four) hours as needed for shortness of breath. 06/06/20   [provider]  busPIRone (BUSPAR) 10 MG tablet Take 20 mg by mouth 3 (three) times daily. 08/16/20   [provider]  ezetimibe (ZETIA) 10 MG tablet Take 1 tablet by mouth daily. 07/25/18   [provider]  FLUoxetine (PROZAC) 20 MG capsule Take 20 mg by mouth daily. Take in addition to the 40 mg capsule for a total of 60 mg daily. 01/03/22   [provider]  FLUoxetine (PROZAC) 40 MG capsule Take 40 mg by mouth daily. Take in addition to the 20 mg capsule for a total of 60 mg daily. 02/07/22   [provider]  fluticasone furoate-vilanterol (BREO ELLIPTA) 100-25 MCG/INH AEPB Inhale 1 puff into the lungs daily as needed (for shortness of breath). 03/08/20   [provider]  furosemide (LASIX) 40 MG tablet Take 40-60 mg by mouth See admin instructions. Take 40mg  by mouth once daily on Monday, Wednesday, Friday, then take 60mg  all other days 09/27/20   [provider]  hydrOXYzine (ATARAX) 25 MG tablet Take 25 mg by mouth 3 (three) times daily. 05/23/21   [provider]  Hyoscyamine Sulfate SL 0.125 MG SUBL Place 1 tablet under the tongue 3 (three) times daily as needed (stomach cramps). Patient not taking: Reported on 04/04/2022 08/23/18   [provider]  IBU 800 MG tablet Take 1 tablet (800  mg total) by mouth every 8 (eight) hours as needed for moderate pain. 10/22/20   Johnson, Clanford L, MD  insulin regular human CONCENTRATED (HUMULIN R) 500 UNIT/ML injection Inject 15-21 Units into the skin See admin instructions. Sliding scale 3 times daily    [provider]  loratadine (CLARITIN) 10 MG tablet Take 10 mg by mouth daily.    [provider]  losartan (COZAAR) 50 MG tablet Take 50 mg by mouth daily. 09/21/20   [provider]  omeprazole (PRILOSEC) 40 MG capsule Take 40 mg by mouth at bedtime. 06/05/20   [provider]  phenytoin (DILANTIN) 100 MG ER capsule Take 400 mg by mouth daily.    [provider]  polyethylene glycol powder (GLYCOLAX/MIRALAX) 17 GM/SCOOP powder Take 1 Container by mouth once. Takes 1 scoop per day    [provider]  prazosin (MINIPRESS) 2 MG capsule Take 2 mg by mouth at bedtime. 11/23/21   [provider]  rosuvastatin (CRESTOR) 40 MG tablet Take 1 tablet by mouth daily. 12/22/18   [provider]  Semaglutide,0.25 or 0.5MG /DOS, 2 MG/1.5ML SOPN Inject 0.25 mg into the skin every Friday. 05/20/21   [provider]  tiZANidine (ZANAFLEX) 4 MG tablet Take 8 mg by mouth at bedtime. Patient not taking: Reported on 04/04/2022 09/01/20   [provider]  topiramate (TOPAMAX) 100 MG tablet Take 200 mg by mouth 2 (two) times daily. 06/06/20   [provider]  traZODone (DESYREL) 150 MG tablet Take 300 mg by mouth at bedtime. 08/17/20   [provider]  warfarin (COUMADIN) 7.5 MG tablet Take 1 tablet (7.5 mg total) by mouth daily at 4 PM. Patient taking differently: Take 3.75-7.5 mg by mouth See admin instructions. 3.75 mg Tuesday,Thursday,Saturday and Sunday  7.5 mg Monday,Wednesday and Friday 10/22/20 02/14/23  Standley Dakins L, MD  atorvastatin (LIPITOR) 40 MG tablet Take 40 mg by mouth daily.  10/21/20  [provider]  citalopram (CELEXA) 20 MG tablet Take by mouth. Patient not taking: Reported on 10/15/2020  10/21/20  [provider]  dicyclomine (BENTYL) 10 MG capsule Take by mouth. Patient not taking: Reported on 10/15/2020  10/21/20  [provider]  fexofenadine (ALLEGRA) 180 MG tablet Take 180 mg by mouth daily.    11/27/19  [provider]  glipiZIDE (GLUCOTROL) 10 MG tablet Take 10 mg by mouth 2 (two) times daily before a meal.    11/27/19  [provider]   lisinopril-hydrochlorothiazide (PRINZIDE,ZESTORETIC) 10-12.5 MG per tablet Take 1 tablet by mouth daily.   Patient not taking: No sig reported  10/21/20  [provider]  sitaGLIPtan-metformin (JANUMET) 50-1000 MG per tablet Take 1 tablet by mouth 2 (two) times daily with a meal.    11/27/19  [provider]  zolpidem (AMBIEN) 10 MG tablet Take 10 mg by mouth at bedtime as needed.    11/27/19  [provider]    Allergies:   Allergies  Allergen Reactions   Latex Other (See Comments)    Welts - cant wear them, but can be touched by someone who wears them.   Penicillins Hives and Itching    Tolerated ceftriaxone 10/2020 " facial swelling"    Social History:  reports that she has never smoked. She has never used smokeless tobacco. She reports that she does not drink alcohol and does not use drugs.  Family History: Family History  Adopted: Yes    Physical Exam: Vitals:   05/10/22  1800 05/10/22 1830 05/10/22 1908 05/10/22 1919  BP: (!) 147/61 (!) 145/49    Pulse: 68 69    Resp: 16 15    Temp:   99 F (37.2 C)   TempSrc:   Oral   SpO2: 95% 94% 91% 96%    General:  Alert and oriented times three, morbidly obese female, no acute distress Eyes: PERRLA, pink conjunctiva, no scleral icterus ENT: Moist oral mucosa, neck supple, no thyromegaly Lungs: clear to ascultation, no wheeze, no crackles, no use of accessory muscles.  Difficult exam secondary to body habitus Cardiovascular: regular rate and rhythm, no regurgitation, no gallops, no murmurs. No carotid bruits, no JVD Abdomen: soft, positive BS, non-tender, non-distended, no organomegaly, not an acute abdomen GU: not examined Neuro: CN II - XII grossly intact, sensation intact Musculoskeletal: strength 5/5 all extremities, no clubbing, cyanosis.  LLE erythema around patient about patient's ankle [new], B/L LE TTP L>R Skin: no rash, no subcutaneous crepitation, no decubitus Psych: appropriate  patient   Labs on Admission:  Recent Labs    05/10/22 1505  NA 139  K 4.1  CL 104  CO2 29  GLUCOSE 205*  BUN 11  CREATININE 0.66  CALCIUM 8.7*    Recent Labs    05/10/22 1505  WBC 9.1  HGB 13.0  HCT 40.8  MCV 86.8  PLT 258    Recent Labs    05/10/22 1505  DDIMER <0.27    Micro Results: Recent Results (from the past 240 hour(s))  Resp panel by RT-PCR (RSV, Flu A&B, Covid) Anterior Nasal Swab     Status: None   Collection Time: 05/10/22  1:20 PM   Specimen: Anterior Nasal Swab  Result Value Ref Range Status   SARS Coronavirus 2 by RT PCR NEGATIVE NEGATIVE Final    Comment: (NOTE) SARS-CoV-2 target nucleic acids are NOT DETECTED.  The SARS-CoV-2 RNA is generally detectable in upper respiratory specimens during the acute phase of infection. The lowest concentration of SARS-CoV-2 viral copies this assay can detect is 138 copies/mL. A negative result does not preclude SARS-Cov-2 infection and should not be used as the sole basis for treatment or other patient management decisions. A negative result may occur with  improper specimen collection/handling, submission of specimen other than nasopharyngeal swab, presence of viral mutation(s) within the areas targeted by this assay, and inadequate number of viral copies(<138 copies/mL). A negative result must be combined with clinical observations, patient history, and epidemiological information. The expected result is Negative.  Fact Sheet for Patients:  BloggerCourse.com  Fact Sheet for Healthcare Providers:  SeriousBroker.it  This test is no t yet approved or cleared by the [place] FDA and  has been authorized for detection and/or diagnosis of SARS-CoV-2 by FDA under an Emergency Use Authorization (EUA). This EUA will remain  in effect (meaning this test can be used) for the duration of the COVID-19 declaration under Section 564(b)(1) of the Act,  21 U.S.C.section 360bbb-3(b)(1), unless the authorization is terminated  or revoked sooner.       Influenza A by PCR NEGATIVE NEGATIVE Final   Influenza B by PCR NEGATIVE NEGATIVE Final    Comment: (NOTE) The Xpert Xpress SARS-CoV-2/FLU/RSV plus assay is intended as an aid in the diagnosis of influenza from Nasopharyngeal swab specimens and should not be used as a sole basis for treatment. Nasal washings and aspirates are unacceptable for Xpert Xpress SARS-CoV-2/FLU/RSV testing.  Fact Sheet for Patients: BloggerCourse.com  Fact Sheet for Healthcare Providers: SeriousBroker.it  This test is not yet approved or cleared by the [place] FDA and has been authorized for detection and/or diagnosis of SARS-CoV-2 by FDA under an Emergency Use Authorization (EUA). This EUA will remain in effect (meaning this test can be used) for the duration of the COVID-19 declaration under Section 564(b)(1) of the Act, 21 U.S.C. section 360bbb-3(b)(1), unless the authorization is terminated or revoked.     Resp Syncytial Virus by PCR NEGATIVE NEGATIVE Final    Comment: (NOTE) Fact Sheet for Patients: BloggerCourse.comhttps://www.fda.gov/media/152166/download  Fact Sheet for Healthcare Providers: SeriousBroker.ithttps://www.fda.gov/media/152162/download  This test is not yet approved or cleared by the [place] FDA and has been authorized for detection and/or diagnosis of SARS-CoV-2 by FDA under an Emergency Use Authorization (EUA). This EUA will remain in effect (meaning this test can be used) for the duration of the COVID-19 declaration under Section 564(b)(1) of the Act, 21 U.S.C. section 360bbb-3(b)(1), unless the authorization is terminated or revoked.  Performed at [facility], [address] [place], [place] [address]      Radiological Exams on Admission: DG Chest 2 View  Result Date: 05/10/2022 CLINICAL DATA:  Shortness of breath, cough and  congestion. EXAM: CHEST - 2 VIEW COMPARISON:  02/14/2022 and CT chest 10/17/2020. FINDINGS: Trachea is midline. Right-sided aortic arch. Heart is enlarged. Lungs are low in volume with interstitial prominence and indistinctness bilaterally. No pleural fluid. Degenerative changes in the spine. IMPRESSION: Pulmonary edema. Electronically Signed   By: Leanna BattlesMelinda  Blietz M.D.   On: 05/10/2022 13:49    Assessment/Plan Present on Admission:  Acute exacerbation of congestive heart failure with diastolic dysfunction (HCC) -Admit to med telemetry -CHF order set initiated -IV Lasix twice daily -Strict I's and O's, daily weights  LLE cellulitis -Cellulitis order set initiated -P.o. clindamycin   Hyperglycemia due to diabetes mellitus (HCC) -Sliding scale insulin, Ozempic resumed   Essential hypertension -Stable, Cozaar, lisinopril resumed   Hyperlipidemia -Stable, Lipitor and Crestor resumed   AF (paroxysmal atrial fibrillation) (HCC) -Coumadin, pharmacy to manage -Currently in normal sinus rhythm, no medications for rate control   OSA (obstructive sleep apnea) -Has not been tested.  However, CPAP ordered   GAD (generalized anxiety disorder) -BuSpar, hydroxyzine, trazodone ordered  Karlea Mckibbin 05/10/2022, 7:55 PM

## 2022-05-10 NOTE — ED Triage Notes (Signed)
PT states that for one week she is having problems with breathing. She states she coughs so much that she stops breathing. Pt states that right ear is hurting. Pt not sure that she is running a fever. She feels like she gasps for air. Sats 95% in triage and placed on 02 for her breathing concern.

## 2022-05-10 NOTE — ED Notes (Signed)
Pt asked to be disconnected so that they could use the restroom next door to their room. When pt returned from restroom, they were having difficulty catching their breath and forming a sentence. RN and MD notified. Will not ambulate with pulse ox at this time, per MD.

## 2022-05-10 NOTE — ED Provider Triage Note (Signed)
Emergency Medicine Provider Triage Evaluation Note  Donna Snow , a 71 y.o. female  was evaluated in triage.  Pt complains of shortness of breath x 1 week. Severe cough, chest pain with coughing. Feels like her right ear is hurting and she's had a fever, but had not checked her temperature. Pt on warfarin, has not taken meds the past 2 days  Review of Systems  Positive: CP, SOB, cough Negative:  Physical Exam  BP (!) 159/62 (BP Location: Left Arm)   Pulse (!) 58   Temp 97.9 F (36.6 C) (Oral)   Resp (!) 22   SpO2 96%  Gen:   Awake, no distress   Resp:  Normal effort  MSK:   Moves extremities without difficulty  Other:    Medical Decision Making  Medically screening exam initiated at 3:07 PM.  Appropriate orders placed.  Donna Snow was informed that the remainder of the evaluation will be completed by another provider, this initial triage assessment does not replace that evaluation, and the importance of remaining in the ED until their evaluation is complete.  Workup initiated.    Donna Adamik T, PA-C 05/10/22 1508

## 2022-05-11 ENCOUNTER — Other Ambulatory Visit (HOSPITAL_COMMUNITY): Payer: Self-pay

## 2022-05-11 DIAGNOSIS — I5033 Acute on chronic diastolic (congestive) heart failure: Secondary | ICD-10-CM

## 2022-05-11 DIAGNOSIS — E782 Mixed hyperlipidemia: Secondary | ICD-10-CM | POA: Diagnosis not present

## 2022-05-11 DIAGNOSIS — I5043 Acute on chronic combined systolic (congestive) and diastolic (congestive) heart failure: Secondary | ICD-10-CM

## 2022-05-11 DIAGNOSIS — I480 Paroxysmal atrial fibrillation: Secondary | ICD-10-CM

## 2022-05-11 DIAGNOSIS — F411 Generalized anxiety disorder: Secondary | ICD-10-CM

## 2022-05-11 DIAGNOSIS — E1165 Type 2 diabetes mellitus with hyperglycemia: Secondary | ICD-10-CM

## 2022-05-11 DIAGNOSIS — R569 Unspecified convulsions: Secondary | ICD-10-CM

## 2022-05-11 DIAGNOSIS — Z7901 Long term (current) use of anticoagulants: Secondary | ICD-10-CM

## 2022-05-11 DIAGNOSIS — I10 Essential (primary) hypertension: Secondary | ICD-10-CM

## 2022-05-11 LAB — CBG MONITORING, ED
Glucose-Capillary: 258 mg/dL — ABNORMAL HIGH (ref 70–99)
Glucose-Capillary: 289 mg/dL — ABNORMAL HIGH (ref 70–99)
Glucose-Capillary: 296 mg/dL — ABNORMAL HIGH (ref 70–99)
Glucose-Capillary: 337 mg/dL — ABNORMAL HIGH (ref 70–99)

## 2022-05-11 LAB — HIV ANTIBODY (ROUTINE TESTING W REFLEX): HIV Screen 4th Generation wRfx: NONREACTIVE

## 2022-05-11 LAB — CBC WITH DIFFERENTIAL/PLATELET
Abs Immature Granulocytes: 0.04 10*3/uL (ref 0.00–0.07)
Basophils Absolute: 0 10*3/uL (ref 0.0–0.1)
Basophils Relative: 0 %
Eosinophils Absolute: 0.3 10*3/uL (ref 0.0–0.5)
Eosinophils Relative: 3 %
HCT: 41.6 % (ref 36.0–46.0)
Hemoglobin: 13.8 g/dL (ref 12.0–15.0)
Immature Granulocytes: 0 %
Lymphocytes Relative: 20 %
Lymphs Abs: 1.9 10*3/uL (ref 0.7–4.0)
MCH: 28.6 pg (ref 26.0–34.0)
MCHC: 33.2 g/dL (ref 30.0–36.0)
MCV: 86.3 fL (ref 80.0–100.0)
Monocytes Absolute: 0.7 10*3/uL (ref 0.1–1.0)
Monocytes Relative: 8 %
Neutro Abs: 6.2 10*3/uL (ref 1.7–7.7)
Neutrophils Relative %: 69 %
Platelets: 276 10*3/uL (ref 150–400)
RBC: 4.82 MIL/uL (ref 3.87–5.11)
RDW: 13.2 % (ref 11.5–15.5)
WBC: 9.1 10*3/uL (ref 4.0–10.5)
nRBC: 0 % (ref 0.0–0.2)

## 2022-05-11 LAB — BASIC METABOLIC PANEL
Anion gap: 8 (ref 5–15)
BUN: 11 mg/dL (ref 8–23)
CO2: 29 mmol/L (ref 22–32)
Calcium: 8.9 mg/dL (ref 8.9–10.3)
Chloride: 99 mmol/L (ref 98–111)
Creatinine, Ser: 0.78 mg/dL (ref 0.44–1.00)
GFR, Estimated: >60 mL/min (ref >60)
Glucose, Bld: 306 mg/dL — ABNORMAL HIGH (ref 70–99)
Potassium: 3.8 mmol/L (ref 3.5–5.1)
Sodium: 136 mmol/L (ref 135–145)

## 2022-05-11 LAB — GLUCOSE, CAPILLARY
Glucose-Capillary: 263 mg/dL — ABNORMAL HIGH (ref 70–99)
Glucose-Capillary: 288 mg/dL — ABNORMAL HIGH (ref 70–99)

## 2022-05-11 LAB — PHENYTOIN LEVEL, TOTAL: Phenytoin Lvl: 4 ug/mL — ABNORMAL LOW (ref 10.0–20.0)

## 2022-05-11 LAB — PROTIME-INR
INR: 1.1 (ref 0.8–1.2)
Prothrombin Time: 14.4 seconds (ref 11.4–15.2)

## 2022-05-11 LAB — BRAIN NATRIURETIC PEPTIDE: B Natriuretic Peptide: 69.2 pg/mL (ref 0.0–100.0)

## 2022-05-11 LAB — ALBUMIN: Albumin: 3.2 g/dL — ABNORMAL LOW (ref 3.5–5.0)

## 2022-05-11 MED ORDER — FLUTICASONE FUROATE-VILANTEROL 100-25 MCG/ACT IN AEPB: 1.0000 | INHALATION_SPRAY | Freq: Every day | RESPIRATORY_TRACT | Status: DC | PRN

## 2022-05-11 MED ORDER — IBUPROFEN 200 MG PO TABS
800.0000 mg | ORAL_TABLET | Freq: Three times a day (TID) | ORAL | Status: DC | PRN
  Administered 2022-05-11 – 2022-05-12 (×2): 800 mg via ORAL
  Filled 2022-05-11 (×2): qty 4

## 2022-05-11 MED ORDER — POTASSIUM CHLORIDE CRYS ER 20 MEQ PO TBCR
40.0000 meq | EXTENDED_RELEASE_TABLET | Freq: Once | ORAL | Status: AC
  Administered 2022-05-11: 40 meq via ORAL
  Filled 2022-05-11: qty 2

## 2022-05-11 MED ORDER — WARFARIN SODIUM 5 MG PO TABS
10.0000 mg | ORAL_TABLET | Freq: Once | ORAL | Status: AC
  Administered 2022-05-11: 10 mg via ORAL
  Filled 2022-05-11: qty 1
  Filled 2022-05-11: qty 2

## 2022-05-11 MED ORDER — BENZONATATE 100 MG PO CAPS: 200.0000 mg | ORAL_CAPSULE | Freq: Three times a day (TID) | ORAL | Status: DC | PRN

## 2022-05-11 MED ORDER — ALBUTEROL SULFATE (2.5 MG/3ML) 0.083% IN NEBU
2.5000 mg | INHALATION_SOLUTION | RESPIRATORY_TRACT | Status: DC | PRN
  Administered 2022-05-11: 2.5 mg via RESPIRATORY_TRACT
  Filled 2022-05-11: qty 3

## 2022-05-11 MED ORDER — TIZANIDINE HCL 4 MG PO TABS
8.0000 mg | ORAL_TABLET | Freq: Three times a day (TID) | ORAL | Status: DC | PRN
  Administered 2022-05-11 – 2022-05-12 (×2): 8 mg via ORAL
  Filled 2022-05-11 (×2): qty 2

## 2022-05-11 MED ORDER — GUAIFENESIN-DM 100-10 MG/5ML PO SYRP
10.0000 mL | ORAL_SOLUTION | ORAL | Status: DC | PRN
  Administered 2022-05-11: 10 mL via ORAL
  Filled 2022-05-11: qty 10

## 2022-05-11 MED ORDER — TRAZODONE HCL 100 MG PO TABS
300.0000 mg | ORAL_TABLET | Freq: Every day | ORAL | Status: DC
  Administered 2022-05-11: 300 mg via ORAL
  Filled 2022-05-11: qty 3
  Filled 2022-05-11: qty 6

## 2022-05-11 MED ORDER — FLEET ENEMA 7-19 GM/118ML RE ENEM
1.0000 | ENEMA | Freq: Once | RECTAL | Status: AC
  Administered 2022-05-11: 1 via RECTAL
  Filled 2022-05-11: qty 1

## 2022-05-11 NOTE — Inpatient Diabetes Management (Addendum)
Inpatient Diabetes Program Recommendations  AACE/ADA: New Consensus Statement on Inpatient Glycemic Control (2015)  Target Ranges:  Prepandial:   less than 140 mg/dL      Peak postprandial:   less than 180 mg/dL (1-2 hours)      Critically ill patients:  140 - 180 mg/dL   Lab Results  Component Value Date   GLUCAP 296 (H) 05/11/2022   HGBA1C 8.1 (H) 02/14/2022    Review of Glycemic Control  Latest Reference Range & Units 05/10/22 21:52 05/11/22 00:25 05/11/22 01:30 05/11/22 10:56 05/11/22 12:27  Glucose-Capillary 70 - 99 mg/dL 425 (H) 956 (H) 387 (H) 337 (H) 296 (H)   Diabetes history: DM 2 Outpatient Diabetes medications: Ozempic 0.5 mg weekly, Humulin R U-500 12-20 units tid tid vial vial and syringe Current orders for Inpatient glycemic control:  Novolog 0-15 units tid + hs  A1c 8.1% on 10/3  Inpatient Diabetes Program Recommendations:    Glucose trends 258-337  -  consider adding home Humulin R U-500 10 units bid  Spoke with pt at bedside and verified home meds. Pt sees Dr. Corwin Levins with [facility] in [facility]. She sees him every 3-4 months. Pt has an appointment coming up.   Thanks, Christena Deem RN, MSN, BC-ADM Inpatient Diabetes Coordinator Team Pager [phone] (8a-5p)

## 2022-05-11 NOTE — Hospital Course (Signed)
Donna Snow is a 71 y.o. F with COPD not on home O2, pAF on Eliquis, uncontrolled DM, HLD, hx stroke, HTN, dCHF, OSA, depression, amd MO BMI 52 who presented with cough and shortness of breath.    12/27: CXR showed edema, admitted on Lasix

## 2022-05-11 NOTE — Assessment & Plan Note (Signed)
Still significantly dyspneic with exertion and desaturates to 88% -Furosemide 40 mg IV twice a day  -K supplement -Strict I/Os, daily weights, telemetry  -Daily monitoring renal function

## 2022-05-11 NOTE — ED Notes (Signed)
Patient's O2 turned off.

## 2022-05-11 NOTE — Assessment & Plan Note (Signed)
Continue warfarin.

## 2022-05-11 NOTE — Progress Notes (Signed)
ANTICOAGULATION CONSULT NOTE  Pharmacy Consult for warfarin Indication: atrial fibrillation  Allergies  Allergen Reactions   Latex Other (See Comments)    Welts - cant wear them, but can be touched by someone who wears them.   Penicillins Hives and Itching    Tolerated ceftriaxone 10/2020 " facial swelling"    Patient Measurements:    Vital Signs: Temp: 98.7 F (37.1 C) (12/28 1101) Temp Source: Oral (12/28 1101) BP: 113/54 (12/28 1200) Pulse Rate: 146 (12/28 1200)  Labs: Recent Labs    05/10/22 1505 05/11/22 0500  HGB 13.0 13.8  HCT 40.8 41.6  PLT 258 276  APTT 28  --   LABPROT 15.0 14.4  INR 1.2 1.1  CREATININE 0.66 0.78     CrCl cannot be calculated (Unknown ideal weight.).   Medical History: Past Medical History:  Diagnosis Date   Anxiety    CHF (congestive heart failure) (HCC)    Diabetes mellitus    Hypercholesteremia    Hypertension    Obesity    Seizures (HCC)    Stroke [facility])     Assessment: 64 YOF presenting with SOB/cough, hx of afib on warfarin PTA. Was unable to take medication two day prior to hospital presentation.  PTA dosing: 7.5mg  daily, except 3.75mg  TTS  INR remains subtherapeutic at 1.1  Goal of Therapy:  INR 2-3 Monitor platelets by anticoagulation protocol: Yes   Plan:  Warfarin 10 mg PO x 1 Daily INR, s/s bleeding  Eldridge Scot, PharmD Clinical Pharmacist 05/11/2022 12:22 PM

## 2022-05-11 NOTE — Progress Notes (Signed)
  Progress Note   Patient: Donna Snow VPX:[id number] DOB: 07/07/1950 DOA: 05/10/2022     1 DOS: the patient was seen and examined on 05/11/2022 at 8:45AM      Brief hospital course: Donna Snow is a 71 y.o. F with COPD not on home O2, pAF on Eliquis, uncontrolled DM, HLD, hx stroke, HTN, dCHF, OSA, depression, amd MO BMI 52 who presented with cough and shortness of breath.    12/27: CXR showed edema, admitted on Lasix     Assessment and Plan: * CHF (congestive heart failure), NYHA class III, acute on chronic, combined (HCC) Still significantly dyspneic with exertion and desaturates to 88% -Furosemide 40 mg IV twice a day  -K supplement -Strict I/Os, daily weights, telemetry  -Daily monitoring renal function    Seizure (HCC) - Continue Phenytoin, Topamax  GAD (generalized anxiety disorder) - Continue Buspar  Cellulitis - Continue clindamycin  OSA (obstructive sleep apnea) - Continue CPAP  Anxiety state - Continue Buspar, trazodone, Atarax  Morbid obesity with BMI of 50.0-59.9, adult (HCC) BMI >40  Chronic anticoagulation - on coumadin for afib - Continue warfarin  AF (paroxysmal atrial fibrillation) (HCC) Rate controlled - Continue warfarin   Hyperlipidemia - Continue statin  Essential hypertension BP controlled - Continue Lasix, losartan, prazosin  Hypoalbuminemia due to protein-calorie malnutrition (HCC)    Hyperglycemia due to diabetes mellitus (HCC) Glucose controlled - Continue SS corections          Subjective: Very dyspneic and gasping with exertion.  No fever, no confusion.     Physical Exam: BP (!) 113/54   Pulse (!) 146   Temp 98.7 F (37.1 C) (Oral)   Resp 19   SpO2 90%   Obese adult female lying in bed, no confusion RRR no murmurs, 2+ LE edema Respiratory rate increased and apepars dysneic with takling, lung sounds diminished, tight Attention normal, face symmetric speech fluent, moves upper extremities normal,   oriented x3  Data Reviewed: Glucose elevated Basic metabolic panel normal (Blood count normal COVID and flu negative Chest x-ray showed edema    Family Communication: None present    Disposition: Status is: Inpatient Patient is significantly dyspneic and requires ongoing IV Lasix, also desaturated to 88%        Author: Alberteen Sam, MD 05/11/2022 3:20 PM  For on call review [email or web address].

## 2022-05-11 NOTE — Assessment & Plan Note (Signed)
Continue CPAP.  

## 2022-05-11 NOTE — Assessment & Plan Note (Signed)
-   Continue Buspar 

## 2022-05-11 NOTE — ED Notes (Signed)
Patients daughter called for an update. Advised the patient was currently resting in bed. She will call back today.

## 2022-05-11 NOTE — ED Notes (Addendum)
Patient was incontinent to some urine. Patient was cleaned and new purewick is in use. Patient was given barier cream for peri-area. Patient is now laying in a hospital bed.Call bell is within reach. Patient continues on 3L oxygen via nasal cannula.

## 2022-05-11 NOTE — Assessment & Plan Note (Signed)
Continue statin. 

## 2022-05-11 NOTE — Assessment & Plan Note (Signed)
Glucose controlled - Continue SS corections

## 2022-05-11 NOTE — Assessment & Plan Note (Signed)
BP controlled - Continue Lasix, losartan, prazosin

## 2022-05-11 NOTE — Progress Notes (Signed)
   Heart Failure Stewardship Pharmacist Progress Note   PCP: [facility] PCP-Cardiologist: None    HPI:  71 yo F with PMH of HTN, HLD, GERD, T2DM, chronic back pain, afib, morbid obesity, OSA, epilepsy, CHF, CVA, GAD, MDD, and OA.  Presented to the ED on 12/27 with shortness of breath, cough, and LE edema. CXR with pulmonary edema. Last ECHO 02/14/22 showed LVEF 55-60%, G1DD, RV normal. Flu and COVID negative. Being treated for cellulitis and HF exacerbation.   Current HF Medications: Diuretic: furosemide 40 mg IV BID ACE/ARB/ARNI: losartan 50 mg daily  Prior to admission HF Medications: Diuretic: furosemide 40 mg MWF, 60 mg all other days  Pertinent Lab Values: Serum creatinine 0.78, BUN 11, Potassium 3.8, Sodium 136, BNP 22  Vital Signs: Weight: not yet recorded Blood pressure: 110/50s  Heart rate: 60-80s  I/O: -1L today; net -1L  Medication Assistance / Insurance Benefits Check: Does the patient have prescription insurance?  Yes Type of insurance plan: [employer]  Outpatient Pharmacy:  Prior to admission outpatient pharmacy: [facility] Is the patient willing to use [facility] TOC pharmacy at discharge? Yes Is the patient willing to transition their outpatient pharmacy to utilize a [facility] outpatient pharmacy?   Pending    Assessment: 1. Acute on chronic diastolic CHF (LVEF 55-60%). NYHA class III symptoms. - Continue furosemide 40 mg IV BID. Strict I/Os and daily weights. Keep K>4. Recommend KCl 40 mEq x 1. Check magnesium in AM. - Continue losartan 50 mg daily - Consider adding MRA prior to discharge for HFpEF - Caution adding SGLT2i with incontinence and body habitus, increased risk for UTI   Plan: 1) Medication changes recommended at this time: - KCl 40 mEq x 1 - Check magnesium in AM  2) Patient assistance: - Currently in the donut hole, Entresto during this time is $107. Can rerun test claim after 1/1 when she is out of the donut hole. Patient  assistance can be initiated if the copay is still high for her.  3)  Education  - To be completed prior to discharge  Sharen Hones, PharmD, BCPS Heart Failure Stewardship Pharmacist Phone [phone]

## 2022-05-11 NOTE — Assessment & Plan Note (Signed)
Rate controlled Continue warfarin 

## 2022-05-11 NOTE — Assessment & Plan Note (Signed)
-   Continue Buspar, trazodone, Atarax

## 2022-05-11 NOTE — Evaluation (Signed)
Physical Therapy Evaluation Patient Details Name: Donna Snow MRN: [id number] DOB: 14-Jul-1950 Today's Date: 05/11/2022  History of Present Illness  71 y.o. female presents to [facility] hospital on 05/10/2022 with SOB and cough. Pt admitted for management of acute CHF exacerbation. PMH includes anxiety, CHF, DM, HTN, seizures, CVA.  Clinical Impression  Pt presents to PT with deficits in activity tolerance, endurance, gait, balance, power. Pt is limited by DOE and back pain when ambulating, reporting significant fatigue with household distances of ambulation. Pt reports it is difficult to maneuver her electric scooter in her home, making household ambulation a task she needs to tolerate frequently during the day. Pt desaturates to mid 80s with attempts at ambulating on room air. Pt will benefit from continued aggressive mobilization in an effort to improve endurance. PT recommends HHPT at the time of discharge.       Recommendations for follow up therapy are one component of a multi-disciplinary discharge planning process, led by the attending physician.  Recommendations may be updated based on patient status, additional functional criteria and insurance authorization.  Follow Up Recommendations Home health PT      Assistance Recommended at Discharge PRN  Patient can return home with the following  A little help with bathing/dressing/bathroom;Assistance with cooking/housework;Assist for transportation;Help with stairs or ramp for entrance    Equipment Recommendations None recommended by PT  Recommendations for Other Services       Functional Status Assessment Patient has had a recent decline in their functional status and demonstrates the ability to make significant improvements in function in a reasonable and predictable amount of time.     Precautions / Restrictions Precautions Precautions: Fall Precaution Comments: monitor O2 sats Restrictions Weight Bearing Restrictions: No       Mobility  Bed Mobility Overal bed mobility: Needs Assistance Bed Mobility: Supine to Sit, Sit to Supine     Supine to sit: Min assist, HOB elevated Sit to supine: Supervision        Transfers Overall transfer level: Needs assistance Equipment used: Straight cane Transfers: Sit to/from Stand, Bed to chair/wheelchair/BSC Sit to Stand: Supervision   Step pivot transfers: Supervision            Ambulation/Gait Ambulation/Gait assistance: Min guard Gait Distance (Feet): 90 Feet Assistive device: Straight cane Gait Pattern/deviations: Step-through pattern, Wide base of support Gait velocity: reduced Gait velocity interpretation: <1.8 ft/sec, indicate of risk for recurrent falls   General Gait Details: pt with slowed step-through gait, widened BOS, back pain and DOE limiting ambulation tolerance  Stairs            Wheelchair Mobility    Modified Rankin (Stroke Patients Only)       Balance Overall balance assessment: Needs assistance Sitting-balance support: No upper extremity supported, Feet supported Sitting balance-Leahy Scale: Good     Standing balance support: Single extremity supported, Reliant on assistive device for balance Standing balance-Leahy Scale: Poor                               Pertinent Vitals/Pain Pain Assessment Pain Assessment: Faces Faces Pain Scale: Hurts even more Pain Location: back Pain Descriptors / Indicators: Grimacing Pain Intervention(s): Monitored during session    Home Living Family/patient expects to be discharged to:: Private residence Living Arrangements: Children Available Help at Discharge: Family;Available PRN/intermittently Type of Home: House Home Access: Stairs to enter Entrance Stairs-Rails: Left Entrance Stairs-Number of Steps: 5   Home Layout:  One level Home Equipment: Agricultural consultant (2 wheels);Cane - single point;Shower seat;Electric scooter      Prior Function Prior Level of  Function : Independent/Modified Independent             Mobility Comments: utilizes cane for mobility in the home, electric scooter for community mobility due to DOE and back pain       Hand Dominance        Extremity/Trunk Assessment   Upper Extremity Assessment Upper Extremity Assessment: Overall WFL for tasks assessed    Lower Extremity Assessment Lower Extremity Assessment: Generalized weakness    Cervical / Trunk Assessment Cervical / Trunk Assessment: Other exceptions Cervical / Trunk Exceptions: morbid obesity  Communication   Communication: No difficulties  Cognition Arousal/Alertness: Awake/alert Behavior During Therapy: WFL for tasks assessed/performed Overall Cognitive Status: Within Functional Limits for tasks assessed                                          General Comments General comments (skin integrity, edema, etc.): pt on 2L [place] upon PT arrival, weaned to room air. Pt desats to 86% when ambulating on room air, with reports of DOE. Pt requries 2L [place] to recover back to 90s when seated and resting    Exercises     Assessment/Plan    PT Assessment Patient needs continued PT services  PT Problem List Decreased strength;Decreased activity tolerance;Decreased balance;Decreased mobility;Cardiopulmonary status limiting activity;Pain       PT Treatment Interventions DME instruction;Gait training;Stair training;Functional mobility training;Therapeutic activities;Balance training;Neuromuscular re-education;Patient/family education    PT Goals (Current goals can be found in the Care Plan section)  Acute Rehab PT Goals Patient Stated Goal: to improve breathing and activity tolerance PT Goal Formulation: With patient Time For Goal Achievement: 05/25/22 Potential to Achieve Goals: Fair Additional Goals Additional Goal #1: Pt will report 1/4 DOE or less when ambulating for household distances in an effort to demonstrate improved activity  tolerance    Frequency Min 3X/week     Co-evaluation               AM-PAC PT "6 Clicks" Mobility  Outcome Measure Help needed turning from your back to your side while in a flat bed without using bedrails?: A Little Help needed moving from lying on your back to sitting on the side of a flat bed without using bedrails?: A Little Help needed moving to and from a bed to a chair (including a wheelchair)?: A Little Help needed standing up from a chair using your arms (e.g., wheelchair or bedside chair)?: A Little Help needed to walk in hospital room?: A Little Help needed climbing 3-5 steps with a railing? : A Little 6 Click Score: 18    End of Session Equipment Utilized During Treatment: Oxygen Activity Tolerance: Patient limited by fatigue Patient left: in bed;with call bell/phone within reach Nurse Communication: Mobility status PT Visit Diagnosis: Other abnormalities of gait and mobility (R26.89)    Time: 2536-6440 PT Time Calculation (min) (ACUTE ONLY): 39 min   Charges:   PT Evaluation $PT Eval Low Complexity: 1 Low PT Treatments $Therapeutic Activity: 8-22 mins      Arlyss Gandy, PT, DPT Acute Rehabilitation Office [phone]   Arlyss Gandy 05/11/2022, 10:45 AM

## 2022-05-11 NOTE — Assessment & Plan Note (Signed)
Continue clindamycin

## 2022-05-11 NOTE — Assessment & Plan Note (Signed)
-   Continue Phenytoin, Topamax

## 2022-05-11 NOTE — Assessment & Plan Note (Signed)
BMI 40 

## 2022-05-11 NOTE — ED Notes (Signed)
ED TO INPATIENT HANDOFF REPORT  ED Nurse Name and Phone #: Luetta Nutting [phone]  S Name/Age/Gender Donna Snow 71 y.o. female Room/Bed: 006C/006C  Code Status   Code Status: Full Code  Home/SNF/Other Home Patient oriented to: self, place, time, and situation Is this baseline? Yes   Triage Complete: Triage complete  Chief Complaint CHF (congestive heart failure), NYHA class III, acute on chronic, combined ([place]) [I50.43]  Triage Note PT states that for one week she is having problems with breathing. She states she coughs so much that she stops breathing. Pt states that right ear is hurting. Pt not sure that she is running a fever. She feels like she gasps for air. Sats 95% in triage and placed on 02 for her breathing concern.    Allergies Allergies  Allergen Reactions   Latex Other (See Comments)    Welts - cant wear them, but can be touched by someone who wears them.   Penicillins Hives and Itching    Tolerated ceftriaxone 10/2020 " facial swelling"    Level of Care/Admitting Diagnosis ED Disposition     ED Disposition  Admit   Condition  --   [place]: [place] [[id number]]  Level of Care: Telemetry Cardiac [103]  May admit patient to [facility] or [facility] if equivalent level of care is available:: No  Covid Evaluation: Asymptomatic - no recent exposure (last 10 days) testing not required  Diagnosis: CHF (congestive heart failure), NYHA class III, acute on chronic, combined [facility]) [id number]  Admitting Physician: [place], [place]  Attending Physician: Quintella Baton [id number]  Certification:: I certify this patient will need inpatient services for at least 2 midnights  Estimated Length of Stay: 2          B Medical/Surgery History Past Medical History:  Diagnosis Date   Anxiety    CHF (congestive heart failure) ([place])    Diabetes mellitus    Hypercholesteremia    Hypertension    Obesity    Seizures ([place])    Stroke [facility])     Past Surgical History:  Procedure Laterality Date   ABDOMINAL HYSTERECTOMY     ABDOMINAL SURGERY     CHOLECYSTECTOMY     JOINT REPLACEMENT       A IV Location/Drains/Wounds Patient Lines/Drains/Airways Status     Active Line/Drains/Airways     Name Placement date Placement time Site Days   Peripheral IV 05/10/22 20 G Anterior;Right Forearm 05/10/22  1906  Forearm  1   External Urinary Catheter --  --  --  --   Wound / Incision (Open or Dehisced) 10/15/20 Incision - Open;Puncture Abdomen Lower;Medial;Right Abcess. I&D washout. 10/15/20  2100  Abdomen  573   Wound / Incision (Open or Dehisced) 02/14/22 Abdomen Right;Upper Abscess 02/14/22  2154  Abdomen  86            Intake/Output Last 24 hours  Intake/Output Summary (Last 24 hours) at 05/11/2022 1421 Last data filed at 05/11/2022 [id number] Gross per 24 hour  Intake 3 ml  Output 900 ml  Net -897 ml    Labs/Imaging Results for orders placed or performed during the hospital encounter of 05/10/22 (from the past 48 hour(s))  Resp panel by RT-PCR (RSV, Flu A&B, Covid) Anterior Nasal Swab     Status: None   Collection Time: 05/10/22  1:20 PM   Specimen: Anterior Nasal Swab  Result Value Ref Range   SARS Coronavirus 2 by RT PCR NEGATIVE NEGATIVE  Comment: (NOTE) SARS-CoV-2 target nucleic acids are NOT DETECTED.  The SARS-CoV-2 RNA is generally detectable in upper respiratory specimens during the acute phase of infection. The lowest concentration of SARS-CoV-2 viral copies this assay can detect is 138 copies/mL. A negative result does not preclude SARS-Cov-2 infection and should not be used as the sole basis for treatment or other patient management decisions. A negative result may occur with  improper specimen collection/handling, submission of specimen other than nasopharyngeal swab, presence of viral mutation(s) within the areas targeted by this assay, and inadequate number of viral copies(<138 copies/mL). A negative  result must be combined with clinical observations, patient history, and epidemiological information. The expected result is Negative.  Fact Sheet for Patients:  EntrepreneurPulse.com.au  Fact Sheet for Healthcare Providers:  IncredibleEmployment.be  This test is no t yet approved or cleared by the [place] FDA and  has been authorized for detection and/or diagnosis of SARS-CoV-2 by FDA under an Emergency Use Authorization (EUA). This EUA will remain  in effect (meaning this test can be used) for the duration of the COVID-19 declaration under Section 564(b)(1) of the Act, 21 U.S.C.section 360bbb-3(b)(1), unless the authorization is terminated  or revoked sooner.       Influenza A by PCR NEGATIVE NEGATIVE   Influenza B by PCR NEGATIVE NEGATIVE    Comment: (NOTE) The Xpert Xpress SARS-CoV-2/FLU/RSV plus assay is intended as an aid in the diagnosis of influenza from Nasopharyngeal swab specimens and should not be used as a sole basis for treatment. Nasal washings and aspirates are unacceptable for Xpert Xpress SARS-CoV-2/FLU/RSV testing.  Fact Sheet for Patients: EntrepreneurPulse.com.au  Fact Sheet for Healthcare Providers: IncredibleEmployment.be  This test is not yet approved or cleared by the [place] FDA and has been authorized for detection and/or diagnosis of SARS-CoV-2 by FDA under an Emergency Use Authorization (EUA). This EUA will remain in effect (meaning this test can be used) for the duration of the COVID-19 declaration under Section 564(b)(1) of the Act, 21 U.S.C. section 360bbb-3(b)(1), unless the authorization is terminated or revoked.     Resp Syncytial Virus by PCR NEGATIVE NEGATIVE    Comment: (NOTE) Fact Sheet for Patients: EntrepreneurPulse.com.au  Fact Sheet for Healthcare Providers: IncredibleEmployment.be  This test is not yet  approved or cleared by the [place] FDA and has been authorized for detection and/or diagnosis of SARS-CoV-2 by FDA under an Emergency Use Authorization (EUA). This EUA will remain in effect (meaning this test can be used) for the duration of the COVID-19 declaration under Section 564(b)(1) of the Act, 21 U.S.C. section 360bbb-3(b)(1), unless the authorization is terminated or revoked.  Performed at [facility], [place] [address], [place], [place] [id number]   Basic metabolic panel     Status: Abnormal   Collection Time: 05/10/22  3:05 PM  Result Value Ref Range   Sodium 139 135 - 145 mmol/L   Potassium 4.1 3.5 - 5.1 mmol/L   Chloride 104 98 - 111 mmol/L   CO2 29 22 - 32 mmol/L   Glucose, Bld 205 (H) 70 - 99 mg/dL    Comment: Glucose reference range applies only to samples taken after fasting for at least 8 hours.   BUN 11 8 - 23 mg/dL   Creatinine, Ser 0.66 0.44 - 1.00 mg/dL   Calcium 8.7 (L) 8.9 - 10.3 mg/dL   GFR, Estimated >60 >60 mL/min    Comment: (NOTE) Calculated using the CKD-EPI Creatinine Equation (2021)    Anion gap  6 5 - 15    Comment: Performed at [facility], [place] [address] [place] [address]  CBC     Status: None   Collection Time: 05/10/22  3:05 PM  Result Value Ref Range   WBC 9.1 4.0 - 10.5 K/uL   RBC 4.70 3.87 - 5.11 MIL/uL   Hemoglobin 13.0 12.0 - 15.0 g/dL   HCT 40.8 36.0 - 46.0 %   MCV 86.8 80.0 - 100.0 fL   MCH 27.7 26.0 - 34.0 pg   MCHC 31.9 30.0 - 36.0 g/dL   RDW 13.2 11.5 - 15.5 %   Platelets 258 150 - 400 K/uL   nRBC 0.0 0.0 - 0.2 %    Comment: Performed at [facility], [place] [address] [place], [place] [address]  Protime-INR     Status: None   Collection Time: 05/10/22  3:05 PM  Result Value Ref Range   Prothrombin Time 15.0 11.4 - 15.2 seconds   INR 1.2 0.8 - 1.2    Comment: (NOTE) INR goal varies based on device and disease states. Performed at [facility], [place] [address] [place],  [place] [address]   APTT     Status: None   Collection Time: 05/10/22  3:05 PM  Result Value Ref Range   aPTT 28 24 - 36 seconds    Comment: Performed at [facility], [place] [address], [place], [place] [address]  D-dimer, quantitative     Status: None   Collection Time: 05/10/22  3:05 PM  Result Value Ref Range   D-Dimer, Quant <0.27 0.00 - 0.50 ug/mL-FEU    Comment: (NOTE) At the manufacturer cut-off value of 0.5 g/mL FEU, this assay has a negative predictive value of 95-100%.This assay is intended for use in conjunction with a clinical pretest probability (PTP) assessment model to exclude pulmonary embolism (PE) and deep venous thrombosis (DVT) in outpatients suspected of PE or DVT. Results should be correlated with clinical presentation. Performed at [facility], [place] [address], [place], [place] [address]   CBG monitoring, ED     Status: Abnormal   Collection Time: 05/10/22  3:21 PM  Result Value Ref Range   Glucose-Capillary 197 (H) 70 - 99 mg/dL    Comment: Glucose reference range applies only to samples taken after fasting for at least 8 hours.  CBG monitoring, ED     Status: Abnormal   Collection Time: 05/10/22  9:52 PM  Result Value Ref Range   Glucose-Capillary 291 (H) 70 - 99 mg/dL    Comment: Glucose reference range applies only to samples taken after fasting for at least 8 hours.  CBG monitoring, ED     Status: Abnormal   Collection Time: 05/11/22 12:25 AM  Result Value Ref Range   Glucose-Capillary 289 (H) 70 - 99 mg/dL    Comment: Glucose reference range applies only to samples taken after fasting for at least 8 hours.  CBG monitoring, ED     Status: Abnormal   Collection Time: 05/11/22  1:30 AM  Result Value Ref Range   Glucose-Capillary 258 (H) 70 - 99 mg/dL    Comment: Glucose reference range applies only to samples taken after fasting for at least 8 hours.  Protime-INR     Status: None   Collection Time: 05/11/22  5:00 AM  Result Value Ref Range    Prothrombin Time 14.4 11.4 - 15.2 seconds   INR 1.1 0.8 - 1.2  Comment: (NOTE) INR goal varies based on device and disease states. Performed at [facility], [place] [address] [place], [place] [id number]   Basic metabolic panel     Status: Abnormal   Collection Time: 05/11/22  5:00 AM  Result Value Ref Range   Sodium 136 135 - 145 mmol/L   Potassium 3.8 3.5 - 5.1 mmol/L   Chloride 99 98 - 111 mmol/L   CO2 29 22 - 32 mmol/L   Glucose, Bld 306 (H) 70 - 99 mg/dL    Comment: Glucose reference range applies only to samples taken after fasting for at least 8 hours.   BUN 11 8 - 23 mg/dL   Creatinine, Ser 0.78 0.44 - 1.00 mg/dL   Calcium 8.9 8.9 - 10.3 mg/dL   GFR, Estimated >60 >60 mL/min    Comment: (NOTE) Calculated using the CKD-EPI Creatinine Equation (2021)    Anion gap 8 5 - 15    Comment: Performed at [facility] [address], [place], [place] [address]  CBC with Differential/Platelet     Status: None   Collection Time: 05/11/22  5:00 AM  Result Value Ref Range   WBC 9.1 4.0 - 10.5 K/uL   RBC 4.82 3.87 - 5.11 MIL/uL   Hemoglobin 13.8 12.0 - 15.0 g/dL   HCT 41.6 36.0 - 46.0 %   MCV 86.3 80.0 - 100.0 fL   MCH 28.6 26.0 - 34.0 pg   MCHC 33.2 30.0 - 36.0 g/dL   RDW 13.2 11.5 - 15.5 %   Platelets 276 150 - 400 K/uL   nRBC 0.0 0.0 - 0.2 %   Neutrophils Relative % 69 %   Neutro Abs 6.2 1.7 - 7.7 K/uL   Lymphocytes Relative 20 %   Lymphs Abs 1.9 0.7 - 4.0 K/uL   Monocytes Relative 8 %   Monocytes Absolute 0.7 0.1 - 1.0 K/uL   Eosinophils Relative 3 %   Eosinophils Absolute 0.3 0.0 - 0.5 K/uL   Basophils Relative 0 %   Basophils Absolute 0.0 0.0 - 0.1 K/uL   Immature Granulocytes 0 %   Abs Immature Granulocytes 0.04 0.00 - 0.07 K/uL    Comment: Performed at [facility], [address] [place], [place] [address]  Phenytoin level, total     Status: Abnormal   Collection Time: 05/11/22  5:00 AM  Result Value Ref Range   Phenytoin Lvl 4.0 (L) 10.0 -  20.0 ug/mL    Comment: Performed at [facility] [address] [place], [place] [address]  HIV Antibody (routine testing w rflx)     Status: None   Collection Time: 05/11/22  5:00 AM  Result Value Ref Range   HIV Screen 4th Generation wRfx Non Reactive Non Reactive    Comment: Performed at [facility], [place] [address] [place], [place] [address]  CBG monitoring, ED     Status: Abnormal   Collection Time: 05/11/22 10:56 AM  Result Value Ref Range   Glucose-Capillary 337 (H) 70 - 99 mg/dL    Comment: Glucose reference range applies only to samples taken after fasting for at least 8 hours.  CBG monitoring, ED     Status: Abnormal   Collection Time: 05/11/22 12:27 PM  Result Value Ref Range   Glucose-Capillary 296 (H) 70 - 99 mg/dL    Comment: Glucose reference range applies only to samples taken after fasting for at least 8 hours.   DG Chest  2 View  Result Date: 05/10/2022 CLINICAL DATA:  Shortness of breath, cough and congestion. EXAM: CHEST - 2 VIEW COMPARISON:  02/14/2022 and CT chest 10/17/2020. FINDINGS: Trachea is midline. Right-sided aortic arch. Heart is enlarged. Lungs are low in volume with interstitial prominence and indistinctness bilaterally. No pleural fluid. Degenerative changes in the spine. IMPRESSION: Pulmonary edema. Electronically Signed   By: Leanna Battles M.D.   On: 05/10/2022 13:49    Pending Labs Unresulted Labs (From admission, onward)     Start     Ordered   05/12/22 0500  Magnesium  Tomorrow morning,   R        05/11/22 1338   05/11/22 0733  Albumin  ONCE - URGENT,   URGENT        05/11/22 0732   05/11/22 0500  Protime-INR  Daily,   R      05/10/22 2041   05/11/22 0500  Basic metabolic panel  Daily,   R     Comments: As Scheduled for 5 days    05/10/22 2042   05/10/22 1731  Brain natriuretic peptide  Add-on,   AD        05/10/22 1730            Vitals/Pain Today's Vitals   05/11/22 0734 05/11/22 0900 05/11/22 1101 05/11/22 1200   BP:  (!) 125/57  (!) 113/54  Pulse:  89  (!) 146  Resp:  (!) 21  19  Temp:   98.7 F (37.1 C)   TempSrc:   Oral   SpO2: 91% 92%  90%  PainSc:        Isolation Precautions Airborne and Contact precautions  Medications Medications  busPIRone (BUSPAR) tablet 20 mg (20 mg Oral Given 05/11/22 0946)  hydrOXYzine (ATARAX) tablet 50 mg (50 mg Oral Given 05/11/22 0946)  ezetimibe (ZETIA) tablet 10 mg (10 mg Oral Not Given 05/11/22 0951)  losartan (COZAAR) tablet 50 mg (50 mg Oral Given 05/11/22 0946)  phenytoin (DILANTIN) ER capsule 400 mg (400 mg Oral Given 05/11/22 0947)  prazosin (MINIPRESS) capsule 2 mg (2 mg Oral Given 05/11/22 0056)  rosuvastatin (CRESTOR) tablet 40 mg (40 mg Oral Given 05/11/22 0946)  topiramate (TOPAMAX) tablet 200 mg (200 mg Oral Given 05/11/22 1245)  insulin aspart (novoLOG) injection 0-15 Units (8 Units Subcutaneous Given 05/11/22 1257)  insulin aspart (novoLOG) injection 0-5 Units (3 Units Subcutaneous Given 05/11/22 0058)  sodium chloride flush (NS) 0.9 % injection 3 mL (3 mLs Intravenous Given 05/11/22 0951)  sodium chloride flush (NS) 0.9 % injection 3 mL (has no administration in time range)  0.9 %  sodium chloride infusion (has no administration in time range)  benzonatate (TESSALON) capsule 100 mg (100 mg Oral Given 05/11/22 0946)  bisacodyl (DULCOLAX) suppository 10 mg (has no administration in time range)  ondansetron (ZOFRAN) tablet 4 mg (has no administration in time range)    Or  ondansetron (ZOFRAN) injection 4 mg (has no administration in time range)  clindamycin (CLEOCIN) capsule 300 mg (300 mg Oral Given 05/11/22 1229)  acetaminophen (TYLENOL) tablet 650 mg (has no administration in time range)  furosemide (LASIX) injection 40 mg (40 mg Intravenous Given 05/11/22 0950)  Warfarin - Pharmacist Dosing Inpatient (has no administration in time range)  traZODone (DESYREL) tablet 300 mg (300 mg Oral Given 05/11/22 0103)  ibuprofen (ADVIL) tablet 800  mg (has no administration in time range)  tiZANidine (ZANAFLEX) tablet 8 mg (has no administration in time range)  fluticasone furoate-vilanterol (BREO  ELLIPTA) 100-25 MCG/ACT 1 puff (has no administration in time range)  albuterol (PROVENTIL) (2.5 MG/3ML) 0.083% nebulizer solution 2.5 mg (has no administration in time range)  benzonatate (TESSALON) capsule 200 mg (has no administration in time range)  guaiFENesin-dextromethorphan (ROBITUSSIN DM) 100-10 MG/5ML syrup 10 mL (has no administration in time range)  warfarin (COUMADIN) tablet 10 mg (has no administration in time range)  sodium phosphate (FLEET) 7-19 GM/118ML enema 1 enema (has no administration in time range)  potassium chloride SA (KLOR-CON M) CR tablet 40 mEq (has no administration in time range)  furosemide (LASIX) injection 60 mg (60 mg Intravenous Given 05/10/22 1907)  acetaminophen (TYLENOL) tablet 1,000 mg (1,000 mg Oral Given 05/10/22 1908)  warfarin (COUMADIN) tablet 10 mg (10 mg Oral Given 05/11/22 0057)    Mobility manual wheelchair     Focused Assessments Pulmonary Assessment Handoff:  Lung sounds:   O2 Device: Room Air O2 Flow Rate (L/min): 3 L/min    R Recommendations: See Admitting Provider Note  Report given to:   Additional Notes:

## 2022-05-12 ENCOUNTER — Encounter (HOSPITAL_COMMUNITY): Payer: Self-pay | Admitting: Family Medicine

## 2022-05-12 DIAGNOSIS — I5043 Acute on chronic combined systolic (congestive) and diastolic (congestive) heart failure: Secondary | ICD-10-CM | POA: Diagnosis not present

## 2022-05-12 LAB — BLOOD GAS, ARTERIAL
Acid-Base Excess: 3.8 mmol/L — ABNORMAL HIGH (ref 0.0–2.0)
Bicarbonate: 29.2 mmol/L — ABNORMAL HIGH (ref 20.0–28.0)
Drawn by: 28099
O2 Saturation: 96.8 %
Patient temperature: 37
pCO2 arterial: 46 mmHg (ref 32–48)
pH, Arterial: 7.41 (ref 7.35–7.45)
pO2, Arterial: 82 mmHg — ABNORMAL LOW (ref 83–108)

## 2022-05-12 LAB — BASIC METABOLIC PANEL
Anion gap: 8 (ref 5–15)
BUN: 20 mg/dL (ref 8–23)
CO2: 29 mmol/L (ref 22–32)
Calcium: 8.9 mg/dL (ref 8.9–10.3)
Chloride: 100 mmol/L (ref 98–111)
Creatinine, Ser: 1.33 mg/dL — ABNORMAL HIGH (ref 0.44–1.00)
GFR, Estimated: 43 mL/min — ABNORMAL LOW (ref >60)
Glucose, Bld: 358 mg/dL — ABNORMAL HIGH (ref 70–99)
Potassium: 4.3 mmol/L (ref 3.5–5.1)
Sodium: 137 mmol/L (ref 135–145)

## 2022-05-12 LAB — PROTIME-INR
INR: 1.4 — ABNORMAL HIGH (ref 0.8–1.2)
Prothrombin Time: 16.7 seconds — ABNORMAL HIGH (ref 11.4–15.2)

## 2022-05-12 LAB — GLUCOSE, CAPILLARY
Glucose-Capillary: 360 mg/dL — ABNORMAL HIGH (ref 70–99)
Glucose-Capillary: 310 mg/dL — ABNORMAL HIGH (ref 70–99)
Glucose-Capillary: 319 mg/dL — ABNORMAL HIGH (ref 70–99)

## 2022-05-12 LAB — MAGNESIUM: Magnesium: 2 mg/dL (ref 1.7–2.4)

## 2022-05-12 MED ORDER — WARFARIN SODIUM 7.5 MG PO TABS
7.5000 mg | ORAL_TABLET | Freq: Once | ORAL | Status: AC
  Administered 2022-05-12: 7.5 mg via ORAL
  Filled 2022-05-12: qty 1

## 2022-05-12 MED ORDER — DOXYCYCLINE MONOHYDRATE 100 MG PO TABS: 100.0000 mg | ORAL_TABLET | Freq: Two times a day (BID) | ORAL | 0 refills | Status: DC

## 2022-05-12 MED ORDER — ALBUTEROL SULFATE (2.5 MG/3ML) 0.083% IN NEBU: 2.5000 mg | INHALATION_SOLUTION | RESPIRATORY_TRACT | 12 refills | Status: AC | PRN

## 2022-05-12 MED ORDER — INSULIN ASPART 100 UNIT/ML IJ SOLN
0.0000 [IU] | Freq: Three times a day (TID) | INTRAMUSCULAR | Status: DC
  Administered 2022-05-12 (×2): 15 [IU] via SUBCUTANEOUS

## 2022-05-12 MED ORDER — INSULIN ASPART 100 UNIT/ML IJ SOLN
4.0000 [IU] | Freq: Three times a day (TID) | INTRAMUSCULAR | Status: DC
  Administered 2022-05-12 (×2): 4 [IU] via SUBCUTANEOUS

## 2022-05-12 MED ORDER — BENZONATATE 100 MG PO CAPS: 100.0000 mg | ORAL_CAPSULE | Freq: Three times a day (TID) | ORAL | 0 refills | Status: DC

## 2022-05-12 MED ORDER — HYDROCOD POLI-CHLORPHE POLI ER 10-8 MG/5ML PO SUER: 5.0000 mL | Freq: Two times a day (BID) | ORAL | 0 refills | Status: DC | PRN

## 2022-05-12 NOTE — Discharge Summary (Signed)
Physician Discharge Summary   Patient: Donna Snow MRN: [id number] DOB: December 16, 1950  Admit date:     05/10/2022  Discharge date: 05/12/22  Discharge Physician: Edwin Dada   PCP: [place]     Recommendations at discharge:  Follow up with PCP [place] in 1 week for bronchitis and Sleep apnea Follow up with Heart Failure clinic in 2 weeks Heart and Vascular Center:  Please refer for Sleep study for OSA      Discharge Diagnoses: Principal Problem:   CHF (congestive heart failure), NYHA class III, acute on chronic, combined ([place]) Active Problems:   Hyperglycemia due to diabetes mellitus ([place])   Hypoalbuminemia due to protein-calorie malnutrition ([place])   Essential hypertension   Hyperlipidemia   AF (paroxysmal atrial fibrillation) (HCC)   Chronic anticoagulation - on coumadin for afib   Morbid obesity with BMI of 50.0-59.9, adult (HCC)   Anxiety state   OSA (obstructive sleep apnea)   Cellulitis   GAD (generalized anxiety disorder)   Seizure [facility])     Hospital Course: Mrs. Kuske is a 71 y.o. F with COPD not on home O2, pAF on Eliquis, uncontrolled DM, HLD, hx stroke, HTN, dCHF, OSA, depression, amd MO BMI 52 who presented with cough and shortness of breath.       * CHF (congestive heart failure), NYHA class III, acute on chronic, combined ([place]) Patient presented with dyspnea on exertion, edema on CXR and orthopnea.  She was diuresed with IV Lasix for 2 days and Cr increased slightly so Cr held.  She had no edema on exam and was able to ambulate with SpO2 >90%.  She reported significant improvement nebulized albuterol, and it was suspected that most of her symptoms were from acute on chronic bronchitis not CHF.     Obstructive sleep apnea Neck circumference elevated, she appears pickwickian and has considerable daytime hypersomnolence.  She also has a family member whom she has been told died of sudden cardiac death  "because of untreated sleep apnea".  Case management were enlisted and diligent search from DME companies indicated that she was not eligible for home CPAP, BiPAP or NIV based on our testing in the hospital. - Recommend sleep study  Seizure ([place]) None here.  Stable on Phenytoin, Topamax  GAD (generalized anxiety disorder) Continue Buspar  Cellulitis Treated with clindamycin, discharged on 5 more days doxycycline  Morbid obesity with BMI of 50.0-59.9, adult (HCC) BMI 55  Chronic anticoagulation - on coumadin for afib AF (paroxysmal atrial fibrillation) (HCC) Stable on warfarin            The [place] was reviewed for this patient prior to discharge.   Consultants: None   Disposition: Home health Diet recommendation:  Discharge Diet Orders (From admission, onward)     Start     Ordered   05/12/22 0000  Diet - low sodium heart healthy        05/12/22 1740             DISCHARGE MEDICATION: Allergies as of 05/12/2022       Reactions   Latex Other (See Comments)   Welts - cant wear them, but can be touched by someone who wears them.   Penicillins Hives, Itching   Tolerated ceftriaxone 10/2020 " facial swelling"        Medication List     STOP taking these medications    albuterol 108 (90 Base) MCG/ACT inhaler Commonly known as: VENTOLIN HFA  Replaced by: albuterol (2.5 MG/3ML) 0.083% nebulizer solution   ibuprofen 800 MG tablet Commonly known as: ADVIL   losartan 50 MG tablet Commonly known as: COZAAR       TAKE these medications    albuterol (2.5 MG/3ML) 0.083% nebulizer solution Commonly known as: PROVENTIL Inhale 3 mLs (2.5 mg total) into the lungs every 4 (four) hours as needed for shortness of breath. Replaces: albuterol 108 (90 Base) MCG/ACT inhaler   benzonatate 100 MG capsule Commonly known as: TESSALON Take 1 capsule (100 mg total) by mouth 3 (three) times daily.   bisacodyl 10 MG/30ML  Enem Commonly known as: FLEET Place 10 mg rectally daily.   busPIRone 10 MG tablet Commonly known as: BUSPAR Take 20 mg by mouth 3 (three) times daily.   chlorpheniramine-HYDROcodone 10-8 MG/5ML Commonly known as: TUSSIONEX Take 5 mLs by mouth every 12 (twelve) hours as needed for cough.   doxycycline 100 MG tablet Commonly known as: ADOXA Take 1 tablet (100 mg total) by mouth 2 (two) times daily.   ezetimibe 10 MG tablet Commonly known as: ZETIA Take 1 tablet by mouth daily.   FLUoxetine 20 MG capsule Commonly known as: PROZAC Take 20 mg by mouth daily. Take in addition to the 40 mg capsule for a total of 60 mg daily.   FLUoxetine 40 MG capsule Commonly known as: PROZAC Take 40 mg by mouth daily. Take in addition to the 20 mg capsule for a total of 60 mg daily.   fluticasone furoate-vilanterol 100-25 MCG/INH Aepb Commonly known as: BREO ELLIPTA Inhale 1 puff into the lungs daily as needed (for shortness of breath).   furosemide 40 MG tablet Commonly known as: LASIX Take 40-60 mg by mouth See admin instructions. Take 40mg  by mouth once daily on Monday, Wednesday, Friday, then take 60mg  all other days   hydrOXYzine 25 MG tablet Commonly known as: ATARAX Take 25 mg by mouth 3 (three) times daily.   insulin regular human CONCENTRATED 500 UNIT/ML injection Commonly known as: HUMULIN R Inject 12-20 Units into the skin 3 (three) times daily before meals. Sliding scale 3 times daily   loratadine 10 MG tablet Commonly known as: CLARITIN Take 10 mg by mouth daily.   omeprazole 40 MG capsule Commonly known as: PRILOSEC Take 40 mg by mouth at bedtime.   Ozempic (0.25 or 0.5 MG/DOSE) 2 MG/3ML Sopn Generic drug: Semaglutide(0.25 or 0.5MG /DOS) Inject 0.5 mg into the skin once a week. Wednesday   phenytoin 100 MG ER capsule Commonly known as: DILANTIN Take 400 mg by mouth daily.   polyethylene glycol powder 17 GM/SCOOP powder Commonly known as: GLYCOLAX/MIRALAX Take 1  Container by mouth once. Takes 1 scoop per day   prazosin 1 MG capsule Commonly known as: MINIPRESS Take 1 mg by mouth at bedtime.   rosuvastatin 40 MG tablet Commonly known as: CRESTOR Take 1 tablet by mouth daily.   tiZANidine 4 MG tablet Commonly known as: ZANAFLEX Take 8 mg by mouth at bedtime.   topiramate 100 MG tablet Commonly known as: TOPAMAX Take 200 mg by mouth 2 (two) times daily.   trazodone 300 MG tablet Commonly known as: DESYREL Take 300 mg by mouth at bedtime as needed for sleep.   warfarin 7.5 MG tablet Commonly known as: Coumadin Take 1 tablet (7.5 mg total) by mouth daily at 4 PM. What changed:  how much to take when to take this additional instructions  Durable Medical Equipment  (From admission, onward)           Start     Ordered   05/12/22 1421  For home use only DME Nebulizer machine  Once       Question Answer Comment  Patient needs a nebulizer to treat with the following condition Chronic bronchitis ([place])   Length of Need Lifetime      05/12/22 1420            Follow-up Information     [place]. Go in 16 day(s).   Specialty: Cardiology Why: Hospital follow up 05/29/2022 PLEASE bring a current medication list to appointment FREE valet parking, Entrance C, off [employer] information: [id number] [place], [facility] Follow up.   Specialty: Home Health Services Why: Agency will call you to set up apt times Contact information: [address] [place] [address] [phone]                 Discharge Instructions     Diet - low sodium heart healthy   Complete by: As directed    Discharge instructions   Complete by: As directed    **IMPORTANT DISCHARGE INSTRUCTIONS**   From Dr. Loleta Books: You were admitted for trouble breathing.  Here  we found that you had a bronchitis.   Thankfully, your oxygen levels are good.  You should Take your home Breo twice daily as directed  For the next week, I recommend taking the new nebulizer albuterol three times daily  If you have cough, take Tussionex up to twice daily  For your leg infection, take doxycycline 100 mg twice daily (starting tonight)   Go see your primary doctor in 1 week  Until you see your primary doctor, do NOT take ibuprofen or losartan Ask them when you can restart those medicines   IMPORTANT: You should not smoke and you should strictly avoid second hand smoke Family members should not smoke in the house, as this puts your health at risk   Increase activity slowly   Complete by: As directed        Discharge Exam: Filed Weights   05/11/22 1618 05/12/22 0525  Weight: (!) 143.2 kg 65.9 kg    General: Pt is alert, awake, not in acute distress Cardiovascular: RRR, nl S1-S2, no murmurs appreciated.   No LE edema.   Respiratory: She reports dyspnea but SpO2 normal, lung sounds clear, no rales or wheezing, good air movement    Abdominal: Abdomen soft and non-tender.  No distension or HSM.   Neuro/Psych: Strength symmetric in upper and lower extremities.  Judgment and insight appear normal.   Condition at discharge: good  The results of significant diagnostics from this hospitalization (including imaging, microbiology, ancillary and laboratory) are listed below for reference.   Imaging Studies: DG Chest 2 View  Result Date: 05/10/2022 CLINICAL DATA:  Shortness of breath, cough and congestion. EXAM: CHEST - 2 VIEW COMPARISON:  02/14/2022 and CT chest 10/17/2020. FINDINGS: Trachea is midline. Right-sided aortic arch. Heart is enlarged. Lungs are low in volume with interstitial prominence and indistinctness bilaterally. No pleural fluid. Degenerative changes in the spine. IMPRESSION: Pulmonary edema. Electronically Signed   By: Lorin Picket M.D.   On:  05/10/2022 13:49    Microbiology: Results for orders placed or performed during the hospital encounter of 05/10/22  Resp panel by RT-PCR (RSV, Flu A&B, Covid) Anterior Nasal Swab     Status: None   Collection Time: 05/10/22  1:20 PM   Specimen: Anterior Nasal Swab  Result Value Ref Range Status   SARS Coronavirus 2 by RT PCR NEGATIVE NEGATIVE Final    Comment: (NOTE) SARS-CoV-2 target nucleic acids are NOT DETECTED.  The SARS-CoV-2 RNA is generally detectable in upper respiratory specimens during the acute phase of infection. The lowest concentration of SARS-CoV-2 viral copies this assay can detect is 138 copies/mL. A negative result does not preclude SARS-Cov-2 infection and should not be used as the sole basis for treatment or other patient management decisions. A negative result may occur with  improper specimen collection/handling, submission of specimen other than nasopharyngeal swab, presence of viral mutation(s) within the areas targeted by this assay, and inadequate number of viral copies(<138 copies/mL). A negative result must be combined with clinical observations, patient history, and epidemiological information. The expected result is Negative.  Fact Sheet for Patients:  BloggerCourse.com  Fact Sheet for Healthcare Providers:  SeriousBroker.it  This test is no t yet approved or cleared by the [place] FDA and  has been authorized for detection and/or diagnosis of SARS-CoV-2 by FDA under an Emergency Use Authorization (EUA). This EUA will remain  in effect (meaning this test can be used) for the duration of the COVID-19 declaration under Section 564(b)(1) of the Act, 21 U.S.C.section 360bbb-3(b)(1), unless the authorization is terminated  or revoked sooner.       Influenza A by PCR NEGATIVE NEGATIVE Final   Influenza B by PCR NEGATIVE NEGATIVE Final    Comment: (NOTE) The Xpert Xpress SARS-CoV-2/FLU/RSV plus  assay is intended as an aid in the diagnosis of influenza from Nasopharyngeal swab specimens and should not be used as a sole basis for treatment. Nasal washings and aspirates are unacceptable for Xpert Xpress SARS-CoV-2/FLU/RSV testing.  Fact Sheet for Patients: BloggerCourse.com  Fact Sheet for Healthcare Providers: SeriousBroker.it  This test is not yet approved or cleared by the [place] FDA and has been authorized for detection and/or diagnosis of SARS-CoV-2 by FDA under an Emergency Use Authorization (EUA). This EUA will remain in effect (meaning this test can be used) for the duration of the COVID-19 declaration under Section 564(b)(1) of the Act, 21 U.S.C. section 360bbb-3(b)(1), unless the authorization is terminated or revoked.     Resp Syncytial Virus by PCR NEGATIVE NEGATIVE Final    Comment: (NOTE) Fact Sheet for Patients: BloggerCourse.com  Fact Sheet for Healthcare Providers: SeriousBroker.it  This test is not yet approved or cleared by the [place] FDA and has been authorized for detection and/or diagnosis of SARS-CoV-2 by FDA under an Emergency Use Authorization (EUA). This EUA will remain in effect (meaning this test can be used) for the duration of the COVID-19 declaration under Section 564(b)(1) of the Act, 21 U.S.C. section 360bbb-3(b)(1), unless the authorization is terminated or revoked.  Performed at [facility], [address], [place], [place] [address]     Labs: CBC: Recent Labs  Lab 05/10/22 1505 05/11/22 0500  WBC 9.1 9.1  NEUTROABS  --  6.2  HGB 13.0 13.8  HCT 40.8 41.6  MCV 86.8 86.3  PLT 258 276   Basic Metabolic Panel: Recent Labs  Lab 05/10/22 1505 05/11/22 0500 05/12/22 0215  NA 139 136 137  K 4.1 3.8 4.3  CL 104 99 100  CO2 29 29 29   GLUCOSE 205* 306* 358*  BUN  11 11 20   CREATININE 0.66 0.78 1.33*   CALCIUM 8.7* 8.9 8.9  MG  --   --  2.0   Liver Function Tests: Recent Labs  Lab 05/11/22 1739  ALBUMIN 3.2*   CBG: Recent Labs  Lab 05/11/22 1642 05/11/22 2108 05/12/22 0611 05/12/22 1136 05/12/22 1610  GLUCAP 263* 288* 360* 310* 319*    Discharge time spent: approximately 35 minutes spent on discharge counseling, evaluation of patient on day of discharge, and coordination of discharge planning with nursing, social work, pharmacy and case management  Signed: Edwin Dada, MD [facility] 05/12/2022

## 2022-05-12 NOTE — Progress Notes (Signed)
Heart Failure Nurse Navigator Progress Note  PCP: [facility] PCP-Cardiologist: None Admission Diagnosis: Acute on chronic congestive heart failure Admitted from: Home  Presentation:   [name] presented with shortness of breath , cough and bilateral lower extremity edema. Patient stopped taking her medications for 2 days due to not feeling well. CXR pulmonary edema, BP 159/62, HR 58, BMI elevated, IV lasix given.  Patient during interview reported lots of stressors including the death of her Lucila Maine a couple weeks ago, using food stamps to help buy food, she states that she buys what food she can afford, uses mainly garlic salt and does drink around 5 diet pepsi per day, and suffers from anxiety.   Education was done on the sign and symptoms of heart failure, daily weights, when to call her doctor or go to the ED, Diet/ fluid restrictions and healthier choices on a budget, taking all medicine as prescribed and attending all medical appointments. Patient verbalized her understanding of education, a hospital follow up with HF TOC was scheduled for 05/29/2022.   ECHO/ LVEF: 55-60% G1DD  Clinical Course:  Past Medical History:  Diagnosis Date   Anxiety    CHF (congestive heart failure) (HCC)    Diabetes mellitus    Hypercholesteremia    Hypertension    Obesity    Seizures (HCC)    Stroke [facility])      Social History   Socioeconomic History   Marital status: Widowed    Spouse name: Not on file   Number of children: 1   Years of education: 9   Highest education level: Not on file  Occupational History    Comment: gets social security  Tobacco Use   Smoking status: Never   Smokeless tobacco: Never  Vaping Use   Vaping Use: Never used  Substance and Sexual Activity   Alcohol use: No   Drug use: No   Sexual activity: Not on file  Other Topics Concern   Not on file  Social History Narrative   Not on file   Social Determinants of Health   Financial  Resource Strain: Low Risk  (05/12/2022)   Overall Financial Resource Strain (CARDIA)    Difficulty of Paying Living Expenses: Not very hard  Food Insecurity: Food Insecurity Present (05/12/2022)   Hunger Vital Sign    Worried About Running Out of Food in the Last Year: Sometimes true    Ran Out of Food in the Last Year: Never true  Transportation Needs: No Transportation Needs (05/12/2022)   PRAPARE - Administrator, Civil Service (Medical): No    Lack of Transportation (Non-Medical): No  Physical Activity: Not on file  Stress: Not on file  Social Connections: Not on file   Education Assessment and Provision:  Detailed education and instructions provided on heart failure disease management including the following:  Signs and symptoms of Heart Failure When to call the physician Importance of daily weights Low sodium diet Fluid restriction Medication management Anticipated future follow-up appointments  Patient education given on each of the above topics.  Patient acknowledges understanding via teach back method and acceptance of all instructions.  Education Materials:  "Living Better With Heart Failure" Booklet, HF zone tool, & Daily Weight Tracker Tool.  Patient has scale at home: yes Patient has pill box at home: NA    High Risk Criteria for Readmission and/or Poor Patient Outcomes: Heart failure hospital admissions (last 6 months): 1  No Show rate: 6 % Difficult social  situation: Yes, gets food stamps Demonstrates medication adherence: Yes Primary Language: English Literacy level: Reading, writing, and comprehension  Barriers of Care:   Diet/ fluid ( uses garlic salt, gets food stamps, drinks 5 diet pepsi per day) Daily weights Continued HF education  Considerations/Referrals:   Referral made to Heart Failure Pharmacist Stewardship: Yes Referral made to Heart Failure CSW/NCM TOC: CSW: ? Help from food pantry Referral made to Heart & Vascular TOC clinic:  Yes, 05/29/2022  Items for Follow-up on DC/TOC: Diet/fluid ( uses lots garlic salt, drinks 5 diet pepsi per day, food stamps ( buys what can afford) Daily weights, continued HF education   Rhae Hammock, BSN, RN Heart Failure Print production planner Chat Only

## 2022-05-12 NOTE — Progress Notes (Signed)
MD Pt's HR goes to 37, on call MD notified, pt has hx of OSA, pt has been off c-pap for a year or so, pt also will need a new c-pap when d/c home.  The HR may be related to her OSA, pt also has a hx of Afib.  Will continue to monitor, Thanks Lavonda Jumbo RN.

## 2022-05-12 NOTE — Inpatient Diabetes Management (Signed)
Inpatient Diabetes Program Recommendations  AACE/ADA: New Consensus Statement on Inpatient Glycemic Control (2015)  Target Ranges:  Prepandial:   less than 140 mg/dL      Peak postprandial:   less than 180 mg/dL (1-2 hours)      Critically ill patients:  140 - 180 mg/dL   Lab Results  Component Value Date   GLUCAP 360 (H) 05/12/2022   HGBA1C 8.1 (H) 02/14/2022    Latest Reference Range & Units 05/11/22 10:56 05/11/22 12:27 05/11/22 16:42 05/11/22 21:08 05/12/22 06:11  Glucose-Capillary 70 - 99 mg/dL 287 (H) 867 (H) 672 (H) 288 (H) 360 (H)  (H): Data is abnormally high Review of Glycemic Control  Diabetes history: type 2 Outpatient Diabetes medications: Ozempic 0.5 mg weekly, Humulin R U-500 insulin 12-20 units TID with vial and syringe Current orders for Inpatient glycemic control: Novolog 0-15 units correction scale TID & HS scale, Novolog 4 units TID  Inpatient Diabetes Program Recommendations:   CBGs continue to be greater than 200 mg/dl. Recommend adding U-500 insulin 10 units TID and discontinue Novolog 4 units TID meal coverage if starting U-500 insulin. Continue Novolog 0-15 units correction scale as ordered.   Will continue to monitor blood sugars while in the hospital.  Smith Mince RN BSN CDE Diabetes Coordinator Pager: [phone]  8am-5pm

## 2022-05-12 NOTE — Progress Notes (Signed)
ANTICOAGULATION CONSULT NOTE  Pharmacy Consult for warfarin Indication: atrial fibrillation  Allergies  Allergen Reactions   Latex Other (See Comments)    Welts - cant wear them, but can be touched by someone who wears them.   Penicillins Hives and Itching    Tolerated ceftriaxone 10/2020 " facial swelling"    Patient Measurements: Height: 5\' 3"  (160 cm) Weight: 65.9 kg (145 lb 4.5 oz) IBW/kg (Calculated) : 52.4  Vital Signs: Temp: 98.4 F (36.9 C) (12/29 0525) Temp Source: Oral (12/29 0525) BP: 117/51 (12/29 0525) Pulse Rate: 41 (12/29 0111)  Labs: Recent Labs    05/10/22 1505 05/11/22 0500 05/12/22 0215  HGB 13.0 13.8  --   HCT 40.8 41.6  --   PLT 258 276  --   APTT 28  --   --   LABPROT 15.0 14.4 16.7*  INR 1.2 1.1 1.4*  CREATININE 0.66 0.78 1.33*     Estimated Creatinine Clearance: 35.9 mL/min (A) (by C-G formula based on SCr of 1.33 mg/dL (H)).   Medical History: Past Medical History:  Diagnosis Date   Anxiety    CHF (congestive heart failure) (HCC)    Diabetes mellitus    Hypercholesteremia    Hypertension    Obesity    Seizures (HCC)    Stroke [facility])     Assessment: 78 YOF presenting with SOB/cough, hx of afib on warfarin PTA. Was unable to take medication two day prior to hospital presentation so INR subtherapeutic.  Today INR up to 1.4 s/p two boosted doses.  PTA dosing: 7.5mg  daily, except 3.75mg  TTS   Goal of Therapy:  INR 2-3 Monitor platelets by anticoagulation protocol: Yes   Plan:  Warfarin 7.5mg  PO x 1 Daily INR, s/s bleeding   67, PharmD, BCPS, [facility] Clinical Pharmacist [phone] Please check AMION for all [facility] Pharmacy numbers 05/12/2022

## 2022-05-12 NOTE — Care Management Important Message (Signed)
Important Message  Patient Details  Name: Donna Snow MRN: [id number] Date of Birth: Jan 22, 1951   Medicare Important Message Given:  Yes     Renie Ora 05/12/2022, 9:53 AM

## 2022-05-12 NOTE — TOC Transition Note (Addendum)
Transition of Care [facility]) - CM/SW Discharge Note   Patient Details  Name: Donna Snow MRN: [id number] Date of Birth: 01/06/1951  Transition of [facility]) CM/SW Contact:  Leone Haven, RN Phone Number: 05/12/2022, 3:49 PM   Clinical Narrative:    NCM spoke with patient, offered choice for HHPT and neb machine and NIV with Medicare. Gov list, she states she does not have a preference.  NCM made referral to [facility] with [facility], she is able to take referral. NCM made referral to [facility] with [employer] for neb machine and NIV. Baxter Hire will see if patient will qualify for a NIV machine and will let this NCM know. She did not qualify so will try for cpap, which her PCP will have to set her up for a outpatient sleep study.     Final next level of care: Home w Home Health Services Barriers to Discharge: Continued Medical Work up   Patient Goals and CMS Choice CMS Medicare.gov Compare Post Acute Care list provided to:: Patient Choice offered to / list presented to : Patient  Discharge Placement                         Discharge Plan and Services Additional resources added to the After Visit Summary for                  DME Arranged: NIV, Nebulizer machine DME Agency: [employer] Date DME Agency Contacted: 05/12/22 Time DME Agency Contacted: 1549 Representative spoke with at DME Agency: Baxter Hire HH Arranged: PT HH Agency: [facility] Date [facility] Agency Contacted: 05/12/22 Time HH Agency Contacted: 1549 Representative spoke with at [facility] Agency: Cyndi  Social Determinants of Health (SDOH) Interventions SDOH Screenings   Food Insecurity: Food Insecurity Present (05/12/2022)  Housing: Low Risk  (05/12/2022)  Transportation Needs: No Transportation Needs (05/12/2022)  Utilities: Not At Risk (02/15/2022)  Depression (PHQ2-9): High Risk (04/04/2022)  Financial Resource Strain: Low Risk  (05/12/2022)  Tobacco Use: Low Risk  (05/12/2022)     Readmission Risk  Interventions     No data to display

## 2022-05-12 NOTE — Progress Notes (Signed)
   Heart Failure Stewardship Pharmacist Progress Note   PCP: [facility] PCP-Cardiologist: None    HPI:  71 yo F with PMH of HTN, HLD, GERD, T2DM, chronic back pain, afib, morbid obesity, OSA, epilepsy, CHF, CVA, GAD, MDD, and OA.  Presented to the ED on 12/27 with shortness of breath, cough, and LE edema. CXR with pulmonary edema. Last ECHO 02/14/22 showed LVEF 55-60%, G1DD, RV normal. Flu and COVID negative. Being treated for cellulitis and HF exacerbation.   Current HF Medications: None  Prior to admission HF Medications: Diuretic: furosemide 40 mg MWF, 60 mg all other days  Pertinent Lab Values: Serum creatinine 1.33, BUN 20, Potassium 4.3, Sodium 137, Magnesium 2.0, BNP 22  Vital Signs: Weight: 145 lbs Blood pressure: 90-120/50s  Heart rate: 40-50s  I/O: -1L yesterday; net -1.1L  Medication Assistance / Insurance Benefits Check: Does the patient have prescription insurance?  Yes Type of insurance plan: [employer]  Outpatient Pharmacy:  Prior to admission outpatient pharmacy: [facility] Is the patient willing to use [facility] TOC pharmacy at discharge? Yes Is the patient willing to transition their outpatient pharmacy to utilize a [facility] outpatient pharmacy?   Pending    Assessment: 1. Acute on chronic diastolic CHF (LVEF 55-60%). NYHA class III symptoms. - Off IV lasix with AKI. Strict I/Os and daily weights. Keep K>4 and Mag>2. - Off losartan with AKI and hypotension - Consider adding MRA prior to discharge for HFpEF - Caution adding SGLT2i with incontinence and body habitus, increased risk for UTI   Plan: 1) Medication changes recommended at this time: - None  2) Patient assistance: - Currently in the donut hole, Entresto during this time is $107. Can rerun test claim after 1/1 when she is out of the donut hole. Patient assistance can be initiated if the copay is still high for her.  3)  Education  - Initial education provided - Full HF  education to be completed prior to discharge  Sharen Hones, PharmD, BCPS Heart Failure Stewardship Pharmacist Phone [phone]

## 2022-05-17 ENCOUNTER — Telehealth: Payer: Self-pay

## 2022-05-17 NOTE — Patient Outreach (Signed)
Aging Gracefully Program  05/17/2022  Cherissa Hook [birth year] [id number]   VM from Myriam Jacobson a case manager who inquired about the Aging Gracefully program conflicting with a case management program through MD office. I communicated that AG program is not billable.  [name] RN, BSN, Careers information officer for [employer] Mobile: [phone]

## 2022-05-23 ENCOUNTER — Encounter: Payer: Self-pay | Admitting: Specialist

## 2022-05-26 ENCOUNTER — Telehealth (HOSPITAL_COMMUNITY): Payer: Self-pay

## 2022-05-26 NOTE — Telephone Encounter (Signed)
Called to confirm Heart & Vascular Transitions of Care appointment at 9 on 05/29/22. Patient reminded to bring all medications and pill box organizer with them. Gave directions, instructed to utilize [place] parking.  Left message to confirm appointment prior to ending call.

## 2022-05-29 ENCOUNTER — Encounter: Payer: Self-pay | Admitting: Specialist

## 2022-05-29 ENCOUNTER — Encounter (HOSPITAL_COMMUNITY): Payer: Medicare HMO

## 2022-05-29 ENCOUNTER — Other Ambulatory Visit (HOSPITAL_COMMUNITY): Payer: Self-pay

## 2022-05-29 NOTE — Progress Notes (Incomplete)
HEART & VASCULAR TRANSITION OF CARE CONSULT NOTE     Referring Physician: Dr. Loleta Books Primary Care: [place] Primary Cardiologist: None   HPI: Referred to clinic by Dr. Loleta Books for heart failure consultation.   72 y/o female w/ h/o morbid obesity, There is no height or weight on file to calculate BMI., OSA/OHS, COPD, HFpEF, HTN, T2DM, PAF, h/o CVA, seizures and GAD.  Recent admit 12/23 for acute CHF. P/w dyspnea and leg swelling. CXR showed pulmonary edema. Also felt to have LLE cellulitis. She was diuresed w/ IV Lasix and treated w/ course of abx. Echo showed normal LVEF, 55-60%, GIDD and normal RV. No significant valvular dysfunction.   After diuresis, she was transitioned to PO diuretics. Referred to [facility] clinic.   She presents today for assessment      Cardiac Testing   Echo 12/23 1. Left ventricular ejection fraction, by estimation, is 55 to 60%. The  left ventricle has normal function. Left ventricular endocardial border  not optimally defined to evaluate regional wall motion. The left  ventricular internal cavity size was mildly  dilated. Left ventricular diastolic parameters are consistent with Grade I  diastolic dysfunction (impaired relaxation). Elevated left atrial  pressure.   2. Right ventricular systolic function is normal. The right ventricular  size is normal. Tricuspid regurgitation signal is inadequate for assessing  PA pressure.   3. Left atrial size was severely dilated.   4. The mitral valve is normal in structure. Trivial mitral valve  regurgitation.   5. The aortic valve is tricuspid. There is mild calcification of the  aortic valve. Aortic valve regurgitation is not visualized. Aortic valve  sclerosis is present, with no evidence of aortic valve stenosis.    Review of Systems: [y] = yes, [ ]  = no   General: Weight gain [ ] ; Weight loss [ ] ; Anorexia [ ] ; Fatigue [ ] ; Fever [ ] ; Chills [ ] ; Weakness [ ]   Cardiac: Chest  pain/pressure [ ] ; Resting SOB [ ] ; Exertional SOB [ ] ; Orthopnea [ ] ; Pedal Edema [ ] ; Palpitations [ ] ; Syncope [ ] ; Presyncope [ ] ; Paroxysmal nocturnal dyspnea[ ]   Pulmonary: Cough [ ] ; Wheezing[ ] ; Hemoptysis[ ] ; Sputum [ ] ; Snoring [ ]   GI: Vomiting[ ] ; Dysphagia[ ] ; Melena[ ] ; Hematochezia [ ] ; Heartburn[ ] ; Abdominal pain [ ] ; Constipation [ ] ; Diarrhea [ ] ; BRBPR [ ]   GU: Hematuria[ ] ; Dysuria [ ] ; Nocturia[ ]   Vascular: Pain in legs with walking [ ] ; Pain in feet with lying flat [ ] ; Non-healing sores [ ] ; Stroke [ ] ; TIA [ ] ; Slurred speech [ ] ;  Neuro: Headaches[ ] ; Vertigo[ ] ; Seizures[ ] ; Paresthesias[ ] ;Blurred vision [ ] ; Diplopia [ ] ; Vision changes [ ]   Ortho/Skin: Arthritis [ ] ; Joint pain [ ] ; Muscle pain [ ] ; Joint swelling [ ] ; Back Pain [ ] ; Rash [ ]   Psych: Depression[ ] ; Anxiety[ ]   Heme: Bleeding problems [ ] ; Clotting disorders [ ] ; Anemia [ ]   Endocrine: Diabetes [ ] ; Thyroid dysfunction[ ]    Past Medical History:  Diagnosis Date   Anxiety    CHF (congestive heart failure) (HCC)    Diabetes mellitus    Hypercholesteremia    Hypertension    Obesity    Seizures (HCC)    Stroke (HCC)     Current Outpatient Medications  Medication Sig Dispense Refill   albuterol (PROVENTIL) (2.5 MG/3ML) 0.083% nebulizer solution Inhale 3 mLs (2.5 mg total) into the lungs every  4 (four) hours as needed for shortness of breath. 75 mL 12   benzonatate (TESSALON) 100 MG capsule Take 1 capsule (100 mg total) by mouth 3 (three) times daily. 20 capsule 0   bisacodyl (FLEET) 10 MG/30ML ENEM Place 10 mg rectally daily.     busPIRone (BUSPAR) 10 MG tablet Take 20 mg by mouth 3 (three) times daily.     chlorpheniramine-HYDROcodone (TUSSIONEX) 10-8 MG/5ML Take 5 mLs by mouth every 12 (twelve) hours as needed for cough. 120 mL 0   doxycycline (ADOXA) 100 MG tablet Take 1 tablet (100 mg total) by mouth 2 (two) times daily. 10 tablet 0   ezetimibe (ZETIA) 10 MG tablet Take 1 tablet by mouth  daily.     FLUoxetine (PROZAC) 20 MG capsule Take 20 mg by mouth daily. Take in addition to the 40 mg capsule for a total of 60 mg daily.     FLUoxetine (PROZAC) 40 MG capsule Take 40 mg by mouth daily. Take in addition to the 20 mg capsule for a total of 60 mg daily.     fluticasone furoate-vilanterol (BREO ELLIPTA) 100-25 MCG/INH AEPB Inhale 1 puff into the lungs daily as needed (for shortness of breath).     furosemide (LASIX) 40 MG tablet Take 40-60 mg by mouth See admin instructions. Take 40mg  by mouth once daily on Monday, Wednesday, Friday, then take 60mg  all other days     hydrOXYzine (ATARAX) 25 MG tablet Take 25 mg by mouth 3 (three) times daily.     insulin regular human CONCENTRATED (HUMULIN R) 500 UNIT/ML injection Inject 12-20 Units into the skin 3 (three) times daily before meals. Sliding scale 3 times daily     loratadine (CLARITIN) 10 MG tablet Take 10 mg by mouth daily.     omeprazole (PRILOSEC) 40 MG capsule Take 40 mg by mouth at bedtime.     OZEMPIC, 0.25 OR 0.5 MG/DOSE, 2 MG/3ML SOPN Inject 0.5 mg into the skin once a week. Wednesday     phenytoin (DILANTIN) 100 MG ER capsule Take 400 mg by mouth daily.     polyethylene glycol powder (GLYCOLAX/MIRALAX) 17 GM/SCOOP powder Take 1 Container by mouth once. Takes 1 scoop per day     prazosin (MINIPRESS) 1 MG capsule Take 1 mg by mouth at bedtime.     rosuvastatin (CRESTOR) 40 MG tablet Take 1 tablet by mouth daily.     tiZANidine (ZANAFLEX) 4 MG tablet Take 8 mg by mouth at bedtime. (Patient not taking: Reported on 04/04/2022)     topiramate (TOPAMAX) 100 MG tablet Take 200 mg by mouth 2 (two) times daily.     trazodone (DESYREL) 300 MG tablet Take 300 mg by mouth at bedtime as needed for sleep.     warfarin (COUMADIN) 7.5 MG tablet Take 1 tablet (7.5 mg total) by mouth daily at 4 PM. (Patient taking differently: Take 3.75-7.5 mg by mouth See admin instructions. 3.75 mg ,Saturday and Sunday  7.5 mg Monday, Tuesday Wednesday,  Thursday and Friday.) 30 tablet 0   No current facility-administered medications for this visit.    Allergies  Allergen Reactions   Latex Other (See Comments)    Welts - cant wear them, but can be touched by someone who wears them.   Penicillins Hives and Itching    Tolerated ceftriaxone 10/2020 " facial swelling"      Social History   Socioeconomic History   Marital status: Widowed    Spouse name: Not on file  Number of children: 1   Years of education: 9   Highest education level: Not on file  Occupational History    Comment: gets social security  Tobacco Use   Smoking status: Never   Smokeless tobacco: Never  Vaping Use   Vaping Use: Never used  Substance and Sexual Activity   Alcohol use: No   Drug use: No   Sexual activity: Not on file  Other Topics Concern   Not on file  Social History Narrative   Not on file   Social Determinants of Health   Financial Resource Strain: Low Risk  (05/12/2022)   Overall Financial Resource Strain (CARDIA)    Difficulty of Paying Living Expenses: Not very hard  Food Insecurity: Food Insecurity Present (05/12/2022)   Hunger Vital Sign    Worried About Running Out of Food in the Last Year: Sometimes true    Ran Out of Food in the Last Year: Never true  Transportation Needs: No Transportation Needs (05/12/2022)   PRAPARE - Hydrologist (Medical): No    Lack of Transportation (Non-Medical): No  Physical Activity: Not on file  Stress: Not on file  Social Connections: Not on file  Intimate Partner Violence: Not At Risk (02/15/2022)   Humiliation, Afraid, Rape, and Kick questionnaire    Fear of Current or Ex-Partner: No    Emotionally Abused: No    Physically Abused: No    Sexually Abused: No      Family History  Adopted: Yes    There were no vitals filed for this visit.  PHYSICAL EXAM: General:  Well appearing. No respiratory difficulty HEENT: normal Neck: supple. no JVD. Carotids 2+  bilat; no bruits. No lymphadenopathy or thryomegaly appreciated. Cor: PMI nondisplaced. Regular rate & rhythm. No rubs, gallops or murmurs. Lungs: clear Abdomen: soft, nontender, nondistended. No hepatosplenomegaly. No bruits or masses. Good bowel sounds. Extremities: no cyanosis, clubbing, rash, edema Neuro: alert & oriented x 3, cranial nerves grossly intact. moves all 4 extremities w/o difficulty. Affect pleasant.  ECG:   ASSESSMENT & PLAN:  1. HFpEF - Echo 12/23 EF 55-60%, G1DD, RV normal  - fluid and sodium restriction   2. PAF - on coumadin, INR followed by PCP   3. Hypertension  - controlled on current regimen  4. Type 2DM  - last Hgb A1c 10/23 was 8.3  - on insulin and Ozempic - per PCP   5. OSA - encouraged compliance w/ CPAP    NYHA *** GDMT  Diuretic- BB- Ace/ARB/ARNI MRA SGLT2i    Referred to HFSW (PCP, Medications, Transportation, ETOH Abuse, Drug Abuse, Insurance, Financial ): Yes or No Refer to Pharmacy: Yes or No Refer to Home Health: Yes on No Refer to Advanced Heart Failure Clinic: Yes or no  Refer to General Cardiology: Yes or No  Follow up

## 2022-05-30 ENCOUNTER — Other Ambulatory Visit: Payer: Self-pay

## 2022-05-30 ENCOUNTER — Other Ambulatory Visit: Payer: Medicare HMO

## 2022-05-30 NOTE — Patient Instructions (Signed)
Visit Information  Thank you for taking time to visit with me today. Please don't hesitate to contact me if I can be of assistance to you before our next scheduled telephone appointment.  Following are the goals we discussed today:   Goals       Aging Gracefully (pt-stated)      Goal: Patient will report decrease in back pain in the next 120 days.  04/04/2022 Assessment: Patient complains of severe back pain with limits her ADLs and IDALS.  Reports she has not been able to go to the pain clinic due to her daughter having her car.  Daughter is coming back to [place] today and will take patient to the pain clinic.   Patient has a hooverround that she uses sometimes in her home but was waiting for daughter to come home before she is able to use it on the temp ramp.  Interventions:Brainstorming exercises performed during home visit.  encouraged patient to go back to the pain clinic. Reviewed heat and cold therapy. Encouraged patient to be as active as she can.  Reviewed with patient at the next home visit we would work on a home exercise plan.   Plan: next home visit planned for 05/02/2022  [name] RN, BSN, CEN RN Case Manager for [place] Mobile: [phone]   05/02/2022  Assessment:  Continue to have back pain.  No falls.  Ambulatory to the door.  Interventions:  Provided home exercise plan and reviewed.  Reviewed fall prevention. Plan: follow up 05/30/2022  [name] RN, BSN, CEN RN Case Manager for [place] Mobile: [phone]   05/30/2022 Assessment:  recent fall getting out of the bath tub with bruising noted.  Patient has not attended her pain clinic appointment. Has missed her INR appointment.  MD seen patient at home. Continues to complain of pain.  States the her right leg is painful from fall in bathroom.  Reports she is doing her home exercises with her neck and arms.  Active with home  health.  Interventions: reviewed with patient fall prevention. Encouraged patient to have her daughter assist her in and out of the tub. Reviewed importance of continuing her home exercise plan. Reviewed with family importance for patient to go to her appointments.  Reviewed pending appointments with patient. Encouraged ambulation. Email send to [employer] to inform of fall.  Plan: next home visit planned fro 06/30/2022  [name] RN, BSN, CEN RN Case Manager for [facility] Mobile: [phone]        Patient Stated      Patient will improve safety with shower and commode transfers.       Patient Stated      Patient will improve independence and safety with home mobility using her hover round.      Patient Stated      Patient will improve safety retrieving items off of the floor.  ACTION PLANNING - CUSTOM  Target Problem Area:  difficulty picking items up from floor.   Why Problem May Occur: Back pain Decreased balance Decreased grip Difficulty bending over  Target Goal: Patient will be able to pick up items from floor with confidence and without straining her back.   STRATEGIES Saving Your Energy: DO: DON'T:  Use reacher to pick up items from floor, overhead. Over reach in front of body or to sides of body.  Modifying your home environment and making it safe: DO: DON'T:                 Simplifying the way you set up tasks or daily routines: DO: DON'T:            Practice It is important to practice the strategies so we can determine if they will be effective in helping to reach your goal. Follow these specific recommendations:  Use reacher to pick up items up off floor. 2.  Use reacher to retrieve items from overhead cabinets. 3.  Do not over reach to your front or sides.  If a strategy does not work the first time, try it again and again (and maybe again). We may make some changes over the next  few sessions, based on how they work.   Vangie Bicker, [place], OT/L [phone]         Patient Stated      Patient will improve independence with lower body dressing.          Our next appointment is on 06/30/2022 at 1000am  If you are experiencing a [place] or [place] or need someone to talk to, please call the Suicide and Crisis Lifeline: 988 call the [place] National Suicide Prevention Lifeline: 315-887-1947 or TTY: 2397099024 TTY 660-665-6822) to talk to a trained counselor call 1-800-273-TALK (toll free, 24 hour hotline) go to [facility] [phone]) call 911   The patient verbalized understanding of instructions, educational materials, and care plan provided today and agreed to receive a mailed copy of patient instructions, educational materials, and care plan.   [name] RN, BSN, Careers information officer for [employer] Mobile: [phone]

## 2022-05-30 NOTE — Patient Outreach (Signed)
Aging Gracefully Program  RN Visit  05/30/2022  Donna Snow 06-Jul-1950 [id number]  Visit:  RN Visit Number: 3- Third Visit  RN TIME CALCULATION: Start TIme:  RN Start Time Calculation: 1000 End Time:  RN Stop Time Calculation: 6160 Total Minutes:  RN Time Calculation: 45  Readiness To Change Score:     Universal RN Interventions: Calendar Distribution: Yes Exercise Review: Yes Medications: Yes Medication Changes: No Mood: Yes Pain: Yes PCP Advocacy/Support: No Fall Prevention: Yes Incontinence: Yes Clinician View Of Client Situation: Daughter and a man present and cooking.  Patient sitting in her chair. AMbulated to the door.  Bruise to the left elbow and the right leg. Client View Of His/Her Situation: Recent hospital admission for shortness of breath.  Using breathing treatments. Reports difficulty sleeping.  Reports no changes in medications. Fell in the bathtub getting out.    reports she was able to get out of the tub herself.  Healthcare Provider Communication: none    Clinician View of Client Situation: Public affairs consultant Of Client Situation: Daughter and a man present and cooking.  Patient sitting in her chair. AMbulated to the door.  Bruise to the left elbow and the right leg. Client's View of His/Her Situation: Client View Of His/Her Situation: Recent hospital admission for shortness of breath.  Using breathing treatments. Reports difficulty sleeping.  Reports no changes in medications. Fell in the bathtub getting out.    reports she was able to get out of the tub herself.  Medication Assessment: denies changes to medications    OT Update: reviewed upcoming appointment date and time with patient. RECENT FALL  Session Summary: home cluttered. Many pets. Daughter, man and a child present.  Reviewed in home physician visiting client.   Goals Addressed               This Visit's Progress     Aging Gracefully (pt-stated)        Goal: Patient will report decrease  in back pain in the next 120 days.  04/04/2022 Assessment: Patient complains of severe back pain with limits her ADLs and IDALS.  Reports she has not been able to go to the pain clinic due to her daughter having her car.  Daughter is coming back to [place] today and will take patient to the pain clinic.   Patient has a hooverround that she uses sometimes in her home but was waiting for daughter to come home before she is able to use it on the temp ramp.  Interventions:Brainstorming exercises performed during home visit.  encouraged patient to go back to the pain clinic. Reviewed heat and cold therapy. Encouraged patient to be as active as she can.  Reviewed with patient at the next home visit we would work on a home exercise plan.   Plan: next home visit planned for 05/02/2022  [name] RN, BSN, CEN RN Case Manager for [place] Mobile: [phone]   05/02/2022  Assessment:  Continue to have back pain.  No falls.  Ambulatory to the door.  Interventions:  Provided home exercise plan and reviewed.  Reviewed fall prevention. Plan: follow up 05/30/2022  [name] RN, BSN, CEN RN Case Manager for [place] Mobile: [phone]   05/30/2022 Assessment:  recent fall getting out of the bath tub with bruising noted.  Patient has not attended her pain clinic appointment. Has missed her INR appointment.  MD seen patient at home. Continues to complain of pain.  States  the her right leg is painful from fall in bathroom.  Reports she is doing her home exercises with her neck and arms.  Active with home health.  Interventions: reviewed with patient fall prevention. Encouraged patient to have her daughter assist her in and out of the tub. Reviewed importance of continuing her home exercise plan. Reviewed with family importance for patient to go to her appointments.  Reviewed pending appointments with patient. Encouraged ambulation. Email  send to [employer] to inform of fall.  Plan: next home visit planned fro 06/30/2022  [name] RN, BSN, CEN RN Case Manager for [place] Mobile: [phone]         [name] RN, BSN, Careers information officer for [employer] Mobile: [phone]

## 2022-06-02 ENCOUNTER — Encounter: Payer: Self-pay | Admitting: Specialist

## 2022-06-02 ENCOUNTER — Telehealth (HOSPITAL_COMMUNITY): Payer: Self-pay

## 2022-06-02 NOTE — Telephone Encounter (Signed)
Called to confirm Heart & Vascular Transitions of Care appointment at 06/05/22.   Left message to confirm appointment.

## 2022-06-05 ENCOUNTER — Ambulatory Visit (HOSPITAL_COMMUNITY)
Admission: RE | Admit: 2022-06-05 | Discharge: 2022-06-05 | Disposition: A | Discharge status: Home / Self Care | Payer: Medicare HMO | Source: Ambulatory Visit | Attending: Cardiology | Admitting: Cardiology

## 2022-06-05 ENCOUNTER — Encounter (HOSPITAL_COMMUNITY): Payer: Self-pay

## 2022-06-05 ENCOUNTER — Other Ambulatory Visit (HOSPITAL_COMMUNITY): Payer: Self-pay

## 2022-06-05 VITALS — BP 118/86 | HR 65 | Wt 302.6 lb

## 2022-06-05 DIAGNOSIS — E119 Type 2 diabetes mellitus without complications: Secondary | ICD-10-CM | POA: Insufficient documentation

## 2022-06-05 DIAGNOSIS — I10 Essential (primary) hypertension: Secondary | ICD-10-CM | POA: Diagnosis not present

## 2022-06-05 DIAGNOSIS — G4733 Obstructive sleep apnea (adult) (pediatric): Secondary | ICD-10-CM | POA: Insufficient documentation

## 2022-06-05 DIAGNOSIS — J449 Chronic obstructive pulmonary disease, unspecified: Secondary | ICD-10-CM | POA: Insufficient documentation

## 2022-06-05 DIAGNOSIS — Z79899 Other long term (current) drug therapy: Secondary | ICD-10-CM | POA: Diagnosis not present

## 2022-06-05 DIAGNOSIS — Z7901 Long term (current) use of anticoagulants: Secondary | ICD-10-CM | POA: Diagnosis not present

## 2022-06-05 DIAGNOSIS — I110 Hypertensive heart disease with heart failure: Secondary | ICD-10-CM | POA: Insufficient documentation

## 2022-06-05 DIAGNOSIS — Z6841 Body Mass Index (BMI) 40.0 and over, adult: Secondary | ICD-10-CM | POA: Insufficient documentation

## 2022-06-05 DIAGNOSIS — Z794 Long term (current) use of insulin: Secondary | ICD-10-CM | POA: Insufficient documentation

## 2022-06-05 DIAGNOSIS — E669 Obesity, unspecified: Secondary | ICD-10-CM | POA: Diagnosis not present

## 2022-06-05 DIAGNOSIS — I5032 Chronic diastolic (congestive) heart failure: Secondary | ICD-10-CM

## 2022-06-05 DIAGNOSIS — I480 Paroxysmal atrial fibrillation: Secondary | ICD-10-CM | POA: Diagnosis not present

## 2022-06-05 DIAGNOSIS — Z8673 Personal history of transient ischemic attack (TIA), and cerebral infarction without residual deficits: Secondary | ICD-10-CM | POA: Diagnosis not present

## 2022-06-05 LAB — BASIC METABOLIC PANEL
Anion gap: 9 (ref 5–15)
BUN: 13 mg/dL (ref 8–23)
CO2: 24 mmol/L (ref 22–32)
Calcium: 8.7 mg/dL — ABNORMAL LOW (ref 8.9–10.3)
Chloride: 102 mmol/L (ref 98–111)
Creatinine, Ser: 0.75 mg/dL (ref 0.44–1.00)
GFR, Estimated: >60 mL/min (ref >60)
Glucose, Bld: 341 mg/dL — ABNORMAL HIGH (ref 70–99)
Potassium: 4 mmol/L (ref 3.5–5.1)
Sodium: 135 mmol/L (ref 135–145)

## 2022-06-05 LAB — BRAIN NATRIURETIC PEPTIDE: B Natriuretic Peptide: 30.4 pg/mL (ref 0.0–100.0)

## 2022-06-05 MED ORDER — FUROSEMIDE 20 MG PO TABS: 60.0000 mg | ORAL_TABLET | Freq: Every day | ORAL | 3 refills | Status: DC

## 2022-06-05 NOTE — Progress Notes (Signed)
HEART & VASCULAR TRANSITION OF CARE CONSULT NOTE     Referring Physician: Dr. Loleta Books Primary Care: [facility], [facility] Primary Cardiologist: Dr. Stanford Breed    HPI: Referred to clinic by Dr. Loleta Books for heart failure consultation.   72 y/o female w/ h/o morbid obesity, Body mass index is 53.6 kg/m.,  untreated OSA/OHS, COPD, HFpEF, HTN, T2DM, PAF, h/o CVA, seizures and GAD.  Admitted 10/23 for atypical CP. HS trop negative x 3. EKG unremarkable. Echo showed normal LVEF, 55-60%, GIDD and normal RV. No significant valvular dysfunction. Seen by cardiology and symptoms felt to be noncardiac. No further cardiac w/u/ ischemic testing recommended. She was also treated for abdominal wall cellulitis and abscess, secondary to self administered insulin injects. She was evaluated by general surgery, abscess drained at bedside and treated w/ abx.   Recent readmit 12/23 for acute CHF. P/w dyspnea and leg swelling. CXR showed pulmonary edema. EKG showed NSR. Also felt to have LE cellulitis, treated w/ course of abx. For HF management, she was diuresed w/ IV Lasix.   After diuresis, she was transitioned to PO diuretics, Lasix w/ alternating doses of 40 and 60 mg. Also recommended that she be set up for formal sleep study for OSA assessment. Referred to [facility] clinic. D/c wt 315 lb.   She presents today for assessment.  Here w/ daughter and granddaughter. Today's wt is down to 302 lb but she is on Ozempic for DM and wt loss. She reports continued symptoms of congestion w/ exertional dyspnea, nocturnal cough and orthopnea. Has to sleep in recliner. Also w/ LEE. Reports full compliance w/ lasix but notes UOP is minimal w/ the 40 mg dose. Notes good response to 60 mg dose and feels better on those days. Compliant w/ all other meds. On Losartan 50 mg. BP well controlled. No orthostatic symptoms. Checking wt daily at home. Following low salt diet but admits to drinking lots of fluids throughout the day.    BP 118/86. HF 65 bpm, RRR. She is scheduled for sleep study in the next few wks.    Cardiac Testing   Echo 10/23 1. Left ventricular ejection fraction, by estimation, is 55 to 60%. The  left ventricle has normal function. Left ventricular endocardial border  not optimally defined to evaluate regional wall motion. The left  ventricular internal cavity size was mildly  dilated. Left ventricular diastolic parameters are consistent with Grade I  diastolic dysfunction (impaired relaxation). Elevated left atrial  pressure.   2. Right ventricular systolic function is normal. The right ventricular  size is normal. Tricuspid regurgitation signal is inadequate for assessing  PA pressure.   3. Left atrial size was severely dilated.   4. The mitral valve is normal in structure. Trivial mitral valve  regurgitation.   5. The aortic valve is tricuspid. There is mild calcification of the  aortic valve. Aortic valve regurgitation is not visualized. Aortic valve  sclerosis is present, with no evidence of aortic valve stenosis.    Review of Systems: [y] = yes, [ ]  = no   General: Weight gain [ ] ; Weight loss [ ] ; Anorexia [ ] ; Fatigue [ ] ; Fever [ ] ; Chills [ ] ; Weakness [ ]   Cardiac: Chest pain/pressure [ ] ; Resting SOB [ ] ; Exertional SOB [ Y]; Orthopnea [ Y]; Pedal Edema [ Y]; Palpitations [ ] ; Syncope [ ] ; Presyncope [ ] ; Paroxysmal nocturnal dyspnea[ ]   Pulmonary: Cough [ ] ; Wheezing[ ] ; Hemoptysis[ ] ; Sputum [ ] ; Snoring [Y ]  GI: Vomiting[ ] ; Dysphagia[ ] ; Melena[ ] ; Hematochezia [ ] ; Heartburn[ ] ; Abdominal pain [ ] ; Constipation [ ] ; Diarrhea [ ] ; BRBPR [ ]   GU: Hematuria[ ] ; Dysuria [ ] ; Nocturia[ ]   Vascular: Pain in legs with walking [ ] ; Pain in feet with lying flat [ ] ; Non-healing sores [ ] ; Stroke [ ] ; TIA [ ] ; Slurred speech [ ] ;  Neuro: Headaches[ ] ; Vertigo[ ] ; Seizures[ ] ; Paresthesias[ ] ;Blurred vision [ ] ; Diplopia [ ] ; Vision changes [ ]   Ortho/Skin: Arthritis [ ] ; Joint pain  [ ] ; Muscle pain [ ] ; Joint swelling [ ] ; Back Pain [ ] ; Rash [ ]   Psych: Depression[ ] ; Anxiety[ ]   Heme: Bleeding problems [ ] ; Clotting disorders [ ] ; Anemia [ ]   Endocrine: Diabetes [Y ]; Thyroid dysfunction[ ]    Past Medical History:  Diagnosis Date   Anxiety    CHF (congestive heart failure) (HCC)    Diabetes mellitus    Hypercholesteremia    Hypertension    Obesity    Seizures ([place])    Stroke [facility])     Current Outpatient Medications  Medication Sig Dispense Refill   albuterol (PROVENTIL) (2.5 MG/3ML) 0.083% nebulizer solution Inhale 3 mLs (2.5 mg total) into the lungs every 4 (four) hours as needed for shortness of breath. 75 mL 12   bisacodyl (FLEET) 10 MG/30ML ENEM Place 10 mg rectally daily.     busPIRone (BUSPAR) 10 MG tablet Take 20 mg by mouth 3 (three) times daily.     ezetimibe (ZETIA) 10 MG tablet Take 1 tablet by mouth daily.     fluticasone furoate-vilanterol (BREO ELLIPTA) 100-25 MCG/INH AEPB Inhale 1 puff into the lungs daily as needed (for shortness of breath).     furosemide (LASIX) 40 MG tablet Take 40-60 mg by mouth See admin instructions. Take 40mg  by mouth once daily on Monday, Wednesday, Friday, then take 60mg  all other days     hydrOXYzine (ATARAX) 50 MG tablet Take 50 mg by mouth 2 (two) times daily.     insulin regular human CONCENTRATED (HUMULIN R) 500 UNIT/ML injection Inject 12-20 Units into the skin 3 (three) times daily before meals. Sliding scale 3 times daily     loratadine (CLARITIN) 10 MG tablet Take 10 mg by mouth daily.     omeprazole (PRILOSEC) 40 MG capsule Take 40 mg by mouth at bedtime.     OZEMPIC, 0.25 OR 0.5 MG/DOSE, 2 MG/3ML SOPN Inject 0.5 mg into the skin once a week. Wednesday     phenytoin (DILANTIN) 100 MG ER capsule Take 400 mg by mouth daily.     polyethylene glycol powder (GLYCOLAX/MIRALAX) 17 GM/SCOOP powder Take 1 Container by mouth once. Takes 1 scoop per day     prazosin (MINIPRESS) 1 MG capsule Take 1 mg by mouth in the  morning. Take 1mg  (1 tab) in AM. Take 2mg  (2 tab) at bedtime (per patient statement).     rosuvastatin (CRESTOR) 40 MG tablet Take 1 tablet by mouth daily.     topiramate (TOPAMAX) 100 MG tablet Take 200 mg by mouth 2 (two) times daily.     trazodone (DESYREL) 300 MG tablet Take 300 mg by mouth at bedtime as needed for sleep.     warfarin (COUMADIN) 7.5 MG tablet Take 1 tablet (7.5 mg total) by mouth daily at 4 PM. (Patient taking differently: Take 3.75-7.5 mg by mouth See admin instructions. 3.75 mg ,Saturday and Sunday  7.5 mg Monday, Tuesday Wednesday, Thursday and Friday.) 30  tablet 0   No current facility-administered medications for this encounter.    Allergies  Allergen Reactions   Latex Other (See Comments)    Welts - cant wear them, but can be touched by someone who wears them.   Penicillins Hives and Itching    Tolerated ceftriaxone 10/2020 " facial swelling"      Social History   Socioeconomic History   Marital status: Widowed    Spouse name: Not on file   Number of children: 1   Years of education: 9   Highest education level: Not on file  Occupational History    Comment: gets social security  Tobacco Use   Smoking status: Never   Smokeless tobacco: Never  Vaping Use   Vaping Use: Never used  Substance and Sexual Activity   Alcohol use: No   Drug use: No   Sexual activity: Not on file  Other Topics Concern   Not on file  Social History Narrative   Not on file   Social Determinants of Health   Financial Resource Strain: Low Risk  (05/12/2022)   Overall Financial Resource Strain (CARDIA)    Difficulty of Paying Living Expenses: Not very hard  Food Insecurity: Food Insecurity Present (05/12/2022)   Hunger Vital Sign    Worried About Running Out of Food in the Last Year: Sometimes true    Ran Out of Food in the Last Year: Never true  Transportation Needs: No Transportation Needs (05/12/2022)   PRAPARE - Administrator, Civil Service (Medical):  No    Lack of Transportation (Non-Medical): No  Physical Activity: Not on file  Stress: Not on file  Social Connections: Not on file  Intimate Partner Violence: Not At Risk (02/15/2022)   Humiliation, Afraid, Rape, and Kick questionnaire    Fear of Current or Ex-Partner: No    Emotionally Abused: No    Physically Abused: No    Sexually Abused: No      Family History  Adopted: Yes    Vitals:   06/05/22 0907  BP: 118/86  Pulse: 65  SpO2: 94%  Weight: (!) 137.3 kg (302 lb 9.6 oz)    PHYSICAL EXAM: General:  morbidly obese. No respiratory difficulty. Kyphotic posture  HEENT: normal Neck: supple. Thick neck JVD not well visualized. Carotids 2+ bilat; no bruits. No lymphadenopathy or thryomegaly appreciated. Cor: PMI nondisplaced. Regular rate & rhythm. No rubs, gallops or murmurs. Lungs: decreased BS at the bases bilaterally  Abdomen: obese, large panus, soft, nontender, nondistended. No hepatosplenomegaly. No bruits or masses. Good bowel sounds. Extremities: no cyanosis, clubbing, rash. + trace b/l LE edema, chronic venous stasis dermatitis  Neuro: alert & oriented x 3, cranial nerves grossly intact. moves all 4 extremities w/o difficulty. Affect pleasant.  ECG: Not performed    ASSESSMENT & PLAN:  1. HFpEF - Echo 10/23 EF 55-60%, G1DD, RV normal  - volume assessment difficult due to body habitus but appears at least mildly fluid overloaded given presence of leg edema, continued nocturnal cough and orthopnea - NYHA Class III-IIIb, confounded by obesity/deconditioning, OSA/OHS - Increase Lasix to 60 mg daily. Check BMP/BNP today  - We considered transition to [facility] but co-pay will be too high per pt ($45/mo)  - Continue Losartan 50 mg daily  - no SGLT2i given h/o prior GU infections/obesity and large panus - consider MRA next visit if c/w volume overload  - no ? blocker w/ borderline bradycardia  - continue sodium restriction  - advised to  limit fluid intake < 2L/ day    2. PAF - in NSR on admit EKG 12/23, RRR on exam today  - on coumadin, INR followed by PCP   3. Hypertension  - controlled on current regimen - continue Losartan 50 mg daily   4. Type 2DM  - last Hgb A1c 10/23 was 8.3  - on insulin and Ozempic - per PCP   5.  OSA/OHS  - previously diagnosed but did not wear CPAP, ended up returning device - now agreeable to improve compliance. Will need repeat sleep study for new device - pt educated on association between untreated OSA/OHS and HF/Afib and PH. Encouraged to stay compliant w/ CPAP/BiPAP   6. Obesity  - Body mass index is 53.6 kg/m.  - limits mobility  - now on Ozempic, wt steadily decreasing     NYHA III-IIIb  GDMT  Diuretic- Lasix 60 mg daily  BB- No  Ace/ARB/ARNI Losartan (unable to afford Entresto) MRA No, consider next visit  SGLT2i No h/o frequent GU infections     Referred to HFSW (PCP, Medications, Transportation, ETOH Abuse, Drug Abuse, Insurance, Museum/gallery curator ): No  Refer to Pharmacy: No  Refer to Home Health: No Refer to Advanced Heart Failure Clinic: No  Refer to General Cardiology: Yes (plan to send to [place], Dr. Stanford Breed)   Follow up  in [facility] for 2nd visit to reassess volume status

## 2022-06-05 NOTE — Progress Notes (Signed)
Updated SDOH needs, no concern for food currently. Pt states she has food at home and son-in-law has taken on most cooking responsibilities.

## 2022-06-05 NOTE — Patient Instructions (Signed)
Medication Changes:  CHANGE: lasix. Increase lasix to 60mg  total. Take 3 (20mg ) tablets, once a day.   Lab Work:  Labs done today, your results will be available in MyChart, we will contact you for abnormal readings.  Special Instructions // Education:  Do the following things EVERYDAY: Weigh yourself in the morning before breakfast. Write it down and keep it in a log. Take your medicines as prescribed Eat low salt foods--Limit salt (sodium) to 2000 mg per day.  Stay as active as you can everyday Limit all fluids for the day to less than 2 liters  Follow-Up in: 2 weeks.

## 2022-06-20 ENCOUNTER — Telehealth (HOSPITAL_COMMUNITY): Payer: Self-pay

## 2022-06-20 NOTE — Telephone Encounter (Signed)
I called patient to confirm [facility] appointment and she needs to reschedule due to illness. I cancelled the appointment for tomorrow if you can reschedule her.  Thanks!

## 2022-06-21 ENCOUNTER — Ambulatory Visit (HOSPITAL_COMMUNITY): Payer: Medicare HMO

## 2022-06-22 ENCOUNTER — Other Ambulatory Visit: Payer: Self-pay | Admitting: Specialist

## 2022-06-23 NOTE — Patient Outreach (Signed)
Aging Gracefully Program  OT Follow-Up Visit  06/22/2022  Donna Snow [birth year] [id number]  Visit:  3- Third Visit  Start Time:  [id number] End Time:  [id number] Total Minutes:  16  Readiness to Change Score :     Home Environment Assessment:    Durable Medical Equipment: Adaptive Equipment: Sock Aid, Dressing Stick Adaptive Equipment Distribution Date: 06/22/22  Patient Education: Education Provided: Yes Education Details: educated patient on use of dressing stick and sock aide. Person(s) Educated: Patient Comprehension: Verbalized Understanding  Goals:   Goals Addressed             This Visit's Progress    Patient Stated   On track    Patient will improve independence and safety with home mobility using her hover round. Patient states that she is now able to drive her hover round.  Her daughter has assisted her in learning how to use her hover round.  Goal completed.      Patient Stated   On track    Patient will improve independence with lower body dressing.   Name _____[name]__________       Date_2/8/24_________________  OT ACTION PLAN: Dressing  Target Problem Area:  Difficulty completing lower extremity dressing.   Why Problem May Occur:    Sitting on low surface (commode) Difficult to lean over Rushes through activity Decreased endurance            Target Goal(s):   Complete lower body dressing with increased safety and independence   STRATEGIES   Saving Your Energy DO:  Sit down to do tasks- a lightweight chair can be kept in the bathroom  X Use appropriate adaptive equipment (circle appropriate equipment): long-handled shoe horn, sock aide, dressing stick, reacher   X Keep all items you'll need within easy reach  (Other):   (Other):   (Other):    Modifying your home environment and making it safe    DO:                Simplifying the way you set up tasks or daily routines DO:  Wear sturdy shoes that grip the floor    Gather everything you will need before beginning  Wear clothing that is easily manipulated (elastic waistband, etc.)  (Other)   (Other):   (Other):     PRACTICE  Based on what we have talked about, you are willing to try:  _____use of Adaptive equipment____________________________________  ________________________________________________________________   If an idea does not work the first time, try it again (and again).  We may make some changes over the next few sessions, based on how they work.     __Beth Valere Snow, MHA, OT/L________________    _____2/8/24______ Occupational Therapist          Date          Post Clinical Reasoning: Client Action (Goal) Two Interventions: Patient has difficulty donning and doffing socks, shoes, underwear and pants.  discussed causes of difficulty and possible solutions.  Patient has already instituted changes that have helped improve her ease.  OT educated patient on use of sock aide and dressing stick.  patient is please with this adaptive equipment and states she will practice use over the next month. Did Client Try?: Yes Targeted Problem Area Status: A Little Better Client Action (Goal) Three Interventions: improve independence with driving power wheelchair - patient states she has been practicing with her daughter and this is no longer an issue for her. Did Client Try?: Yes Targeted  Problem Area Status: A Little Better Clinician View Of Client Situation:: Donna Snow has made the decision to improve her health and quality of life.  She had stopped drinking sodas, increased her water intake, and is getting up and moving around more with both her daughter and great grand daughter.  These changes along with having family live with her seem to have made a positive improvement on her health and QoL. Client View Of His/Her Situation:: MD told her she was not doing well and she made the decision she wants to live alot longer and healthier.  She  has made changes to her eating/drinking and activity level for the positive. Next Visit Plan:: problem solve goal 4 improving bathroom safety and independence with commode and shower transfers. Donna Snow, [place], OT/L [phone]

## 2022-06-30 ENCOUNTER — Other Ambulatory Visit: Payer: Self-pay

## 2022-06-30 NOTE — Patient Outreach (Signed)
Aging Gracefully Program  06/30/2022  Donna Snow [birth year] [id number]   Arrived for home visit and received a message from the office that patient cancelled appointment.   [name] RN, BSN, Careers information officer for [employer] Mobile: [phone]

## 2022-07-07 ENCOUNTER — Other Ambulatory Visit: Payer: Self-pay

## 2022-07-07 NOTE — Patient Outreach (Signed)
Aging Gracefully Program  RN Visit  07/07/2022  Donna Snow 1951/01/06 [id number]  Visit:  RN Visit Number: 4- Fourth Visit  RN TIME CALCULATION: Start TIme:  RN Start Time Calculation: [id number] End Time:  RN Stop Time Calculation: [id number] Total Minutes:  RN Time Calculation: 45  Readiness To Change Score:     Universal RN Interventions: Calendar Distribution: Yes Exercise Review: Yes Medications: Yes Medication Changes: Yes Mood: Yes Pain: Yes PCP Advocacy/Support: No Fall Prevention: Yes Incontinence: Yes Clinician View Of Client Situation: Patient sitting in her chair.  Home cluttered.  Community housing solutions working on Multimedia programmer today. Ambulated to the door.  Not using any assistive devices Client View Of His/Her Situation: Patient reports that she slipped in the hallway a few days ago and is sore.  Patient admits that she is not using her assistive devices. Reports being anxious with all the family coming and going.  Patient reports she is managing her medications without difficulty. Reports CBG running about  100.   Denies any recent hospitalizations.  Patient reports she is excited about the modifications to her home. Reports she needs to go back to the pain clinic.   States she is waiting to have a sleep study. Reports that her daughter is very helpful to her.  Healthcare Provider Communication:    Public affairs consultant of Client Situation: Clinician View Of Client Situation: Patient sitting in her chair.  Home cluttered.  Community housing solutions working on Multimedia programmer today. Ambulated to the door.  Not using any assistive devices Client's View of His/Her Situation: Client View Of His/Her Situation: Patient reports that she slipped in the hallway a few days ago and is sore.  Patient admits that she is not using her assistive devices. Reports being anxious with all the family coming and going.  Patient reports she is managing her medications without difficulty. Reports CBG running about   100.   Denies any recent hospitalizations.  Patient reports she is excited about the modifications to her home. Reports she needs to go back to the pain clinic.   States she is waiting to have a sleep study. Reports that her daughter is very helpful to her.  Medication Assessment: denies any changes to medications    OT Update: pending completion of home modifications  Session Summary: anxious about family issues. Continues to fall. Reviewed fall prevention.   Goals Addressed               This Visit's Progress     COMPLETED: Aging Gracefully (pt-stated)        Goal: Patient will report decrease in back pain in the next 120 days.  04/04/2022 Assessment: Patient complains of severe back pain with limits her ADLs and IDALS.  Reports she has not been able to go to the pain clinic due to her daughter having her car.  Daughter is coming back to [place] today and will take patient to the pain clinic.   Patient has a hooverround that she uses sometimes in her home but was waiting for daughter to come home before she is able to use it on the temp ramp.  Interventions:Brainstorming exercises performed during home visit.  encouraged patient to go back to the pain clinic. Reviewed heat and cold therapy. Encouraged patient to be as active as she can.  Reviewed with patient at the next home visit we would work on a home exercise plan.   Plan: next home visit planned for 05/02/2022  [name] RN, BSN, CEN  RN Case Manager for [facility] Mobile: [phone]   05/02/2022  Assessment:  Continue to have back pain.  No falls.  Ambulatory to the door.  Interventions:  Provided home exercise plan and reviewed.  Reviewed fall prevention. Plan: follow up 05/30/2022  [name] RN, BSN, CEN RN Case Manager for [facility] Mobile: [phone]   05/30/2022 Assessment:  recent fall getting out of the bath tub with bruising noted.  Patient  has not attended her pain clinic appointment. Has missed her INR appointment.  MD seen patient at home. Continues to complain of pain.  States the her right leg is painful from fall in bathroom.  Reports she is doing her home exercises with her neck and arms.  Active with home health.  Interventions: reviewed with patient fall prevention. Encouraged patient to have her daughter assist her in and out of the tub. Reviewed importance of continuing her home exercise plan. Reviewed with family importance for patient to go to her appointments.  Reviewed pending appointments with patient. Encouraged ambulation. Email send to [employer] to inform of fall.  Plan: next home visit planned fro 06/30/2022  [name] RN, BSN, CEN RN Case Manager for [facility] Mobile: [phone]   07/07/2022 Assessment: Reports that she continues to have mostly from slipping in the hallway.  Reports she is due to go back to the pain clinic and states that she has an appointment. Pain worse with movement today in her groin.   Interventions: reviewed with patient and daughter the importance of using assistive devices and avoiding falls. Reviewed importance of pain control and follow up with MD at the pain center. Assessed pain.   No change in pain level. Reviewed anxiety level.  Plan: patient understands fall prevention and importance of follow up with pain clinic.  Patient will follow up with MD as directed.  Goal completed.  [name] RN, BSN, Careers information officer for [employer] Mobile: [phone]          [name] RN, BSN, Careers information officer for [employer] Mobile: [phone]

## 2022-07-07 NOTE — Patient Instructions (Signed)
Visit Information  Thank you for taking time to visit with me today. Please don't hesitate to contact me if I can be of assistance to you before our next scheduled telephone appointment.  Following are the goals we discussed today:   Goals Addressed               This Visit's Progress     COMPLETED: Aging Gracefully (pt-stated)        Goal: Patient will report decrease in back pain in the next 120 days.  04/04/2022 Assessment: Patient complains of severe back pain with limits her ADLs and IDALS.  Reports she has not been able to go to the pain clinic due to her daughter having her car.  Daughter is coming back to [place] today and will take patient to the pain clinic.   Patient has a hooverround that she uses sometimes in her home but was waiting for daughter to come home before she is able to use it on the temp ramp.  Interventions:Brainstorming exercises performed during home visit.  encouraged patient to go back to the pain clinic. Reviewed heat and cold therapy. Encouraged patient to be as active as she can.  Reviewed with patient at the next home visit we would work on a home exercise plan.   Plan: next home visit planned for 05/02/2022  [name] RN, BSN, CEN RN Case Manager for [place] [employer]: [phone]   05/02/2022  Assessment:  Continue to have back pain.  No falls.  Ambulatory to the door.  Interventions:  Provided home exercise plan and reviewed.  Reviewed fall prevention. Plan: follow up 05/30/2022  [name] RN, BSN, CEN RN Case Manager for [place] [facility]: [phone]   05/30/2022 Assessment:  recent fall getting out of the bath tub with bruising noted.  Patient has not attended her pain clinic appointment. Has missed her INR appointment.  MD seen patient at home. Continues to complain of pain.  States the her right leg is painful from fall in bathroom.  Reports she is doing her home exercises  with her neck and arms.  Active with home health.  Interventions: reviewed with patient fall prevention. Encouraged patient to have her daughter assist her in and out of the tub. Reviewed importance of continuing her home exercise plan. Reviewed with family importance for patient to go to her appointments.  Reviewed pending appointments with patient. Encouraged ambulation. Email send to [employer] to inform of fall.  Plan: next home visit planned fro 06/30/2022  [name] RN, BSN, CEN RN Case Manager for [place] [employer]: [phone]   07/07/2022 Assessment: Reports that she continues to have mostly from slipping in the hallway.  Reports she is due to go back to the pain clinic and states that she has an appointment. Pain worse with movement today in her groin.   Interventions: reviewed with patient and daughter the importance of using assistive devices and avoiding falls. Reviewed importance of pain control and follow up with MD at the pain center. Assessed pain.   No change in pain level. Reviewed anxiety level.  Plan: patient understands fall prevention and importance of follow up with pain clinic.  Patient will follow up with MD as directed.  Goal completed.  [name] RN, BSN, CEN RN Case Freight forwarder for [place] [facility]: [phone]            If you are experiencing a  Mental Health or [place] or need someone to talk to, please call the Suicide and Crisis Lifeline: 988 call the [place] National Suicide Prevention Lifeline: (726)061-5544 or TTY: 8588391480 TTY 650-626-9181) to talk to a trained counselor call 1-800-273-TALK (toll free, 24 hour hotline) go to [facility] [place] [phone]) call 911   The patient verbalized understanding of instructions, educational materials, and care plan provided today and agreed to  receive a mailed copy of patient instructions, educational materials, and care plan.   [name] RN, BSN, Careers information officer for [employer]: [phone]

## 2022-07-12 ENCOUNTER — Ambulatory Visit (HOSPITAL_COMMUNITY): Payer: Medicare HMO

## 2022-07-28 ENCOUNTER — Ambulatory Visit (HOSPITAL_COMMUNITY): Payer: Medicare HMO

## 2022-07-31 ENCOUNTER — Telehealth: Payer: Self-pay | Admitting: Specialist

## 2022-08-15 ENCOUNTER — Ambulatory Visit (HOSPITAL_COMMUNITY)
Admission: RE | Admit: 2022-08-15 | Discharge: 2022-08-15 | Disposition: A | Discharge status: Home / Self Care | Payer: Medicare HMO | Source: Ambulatory Visit | Attending: Cardiology | Admitting: Cardiology

## 2022-08-15 ENCOUNTER — Encounter (HOSPITAL_COMMUNITY): Payer: Self-pay

## 2022-08-15 ENCOUNTER — Other Ambulatory Visit (HOSPITAL_COMMUNITY): Payer: Self-pay

## 2022-08-15 VITALS — BP 134/90 | HR 81 | Wt 315.0 lb

## 2022-08-15 DIAGNOSIS — I480 Paroxysmal atrial fibrillation: Secondary | ICD-10-CM | POA: Diagnosis not present

## 2022-08-15 DIAGNOSIS — I5032 Chronic diastolic (congestive) heart failure: Secondary | ICD-10-CM | POA: Insufficient documentation

## 2022-08-15 DIAGNOSIS — Z7901 Long term (current) use of anticoagulants: Secondary | ICD-10-CM | POA: Diagnosis not present

## 2022-08-15 DIAGNOSIS — Z79899 Other long term (current) drug therapy: Secondary | ICD-10-CM | POA: Insufficient documentation

## 2022-08-15 DIAGNOSIS — E119 Type 2 diabetes mellitus without complications: Secondary | ICD-10-CM | POA: Insufficient documentation

## 2022-08-15 DIAGNOSIS — M545 Low back pain, unspecified: Secondary | ICD-10-CM | POA: Insufficient documentation

## 2022-08-15 DIAGNOSIS — G8929 Other chronic pain: Secondary | ICD-10-CM | POA: Insufficient documentation

## 2022-08-15 DIAGNOSIS — J449 Chronic obstructive pulmonary disease, unspecified: Secondary | ICD-10-CM | POA: Insufficient documentation

## 2022-08-15 DIAGNOSIS — Z8673 Personal history of transient ischemic attack (TIA), and cerebral infarction without residual deficits: Secondary | ICD-10-CM | POA: Diagnosis not present

## 2022-08-15 DIAGNOSIS — G4733 Obstructive sleep apnea (adult) (pediatric): Secondary | ICD-10-CM | POA: Insufficient documentation

## 2022-08-15 DIAGNOSIS — I5043 Acute on chronic combined systolic (congestive) and diastolic (congestive) heart failure: Secondary | ICD-10-CM | POA: Diagnosis not present

## 2022-08-15 DIAGNOSIS — E669 Obesity, unspecified: Secondary | ICD-10-CM | POA: Diagnosis not present

## 2022-08-15 DIAGNOSIS — I110 Hypertensive heart disease with heart failure: Secondary | ICD-10-CM | POA: Diagnosis not present

## 2022-08-15 DIAGNOSIS — Z794 Long term (current) use of insulin: Secondary | ICD-10-CM | POA: Insufficient documentation

## 2022-08-15 DIAGNOSIS — Z6841 Body Mass Index (BMI) 40.0 and over, adult: Secondary | ICD-10-CM | POA: Insufficient documentation

## 2022-08-15 DIAGNOSIS — Z7985 Long-term (current) use of injectable non-insulin antidiabetic drugs: Secondary | ICD-10-CM | POA: Insufficient documentation

## 2022-08-15 DIAGNOSIS — R0609 Other forms of dyspnea: Secondary | ICD-10-CM | POA: Diagnosis not present

## 2022-08-15 LAB — BASIC METABOLIC PANEL
Anion gap: 8 (ref 5–15)
BUN: 11 mg/dL (ref 8–23)
CO2: 27 mmol/L (ref 22–32)
Calcium: 8.8 mg/dL — ABNORMAL LOW (ref 8.9–10.3)
Chloride: 101 mmol/L (ref 98–111)
Creatinine, Ser: 0.72 mg/dL (ref 0.44–1.00)
GFR, Estimated: >60 mL/min (ref >60)
Glucose, Bld: 269 mg/dL — ABNORMAL HIGH (ref 70–99)
Potassium: 4.1 mmol/L (ref 3.5–5.1)
Sodium: 136 mmol/L (ref 135–145)

## 2022-08-15 LAB — BRAIN NATRIURETIC PEPTIDE: B Natriuretic Peptide: 9.3 pg/mL (ref 0.0–100.0)

## 2022-08-15 MED ORDER — SPIRONOLACTONE 25 MG PO TABS
12.5000 mg | ORAL_TABLET | Freq: Every day | ORAL | 11 refills | Status: AC
Start: 2022-08-15 — End: ?

## 2022-08-15 MED ORDER — FUROSEMIDE 20 MG PO TABS: 60.0000 mg | ORAL_TABLET | Freq: Every day | ORAL | 3 refills | Status: DC

## 2022-08-15 NOTE — Patient Instructions (Addendum)
Increase Lasix to 60 mg daily. Start Spiro (Aldactone) 12.5 mg daily - new Rx sent to local pharmacy. Referral sent for you to see [facility] - please call number below to schedule your 3 week follow up appointment. Referral sent to Pulmonology; they should call you to schedule. If you don't hear from that office, call them at number below. Will sent referral for help with weight loss to following:   [facility]   [place],  [phone] If you do not hear from them, please call their office to schedule your first appointment.  6. Labs drawn today - will call you if abnormal. 7. Call us at [phone] if any questions or issues prior to your first appointment with Cardiology.

## 2022-08-15 NOTE — Progress Notes (Signed)
HEART & VASCULAR TRANSITION OF CARE CONSULT NOTE     Referring Physician: Dr. Loleta Books Primary Care: [facility], [facility] Primary Cardiologist: Dr. Stanford Breed    HPI: Referred to clinic by Dr. Loleta Books for heart failure consultation.   72 y/o female w/ h/o morbid obesity, Body mass index is 55.8 kg/m.,  untreated OSA/OHS, COPD, HFpEF, HTN, T2DM, PAF, h/o CVA, seizures and GAD.  Admitted 10/23 for atypical CP. HS trop negative x 3. EKG unremarkable. Echo showed normal LVEF, 55-60%, GIDD and normal RV. No significant valvular dysfunction. Seen by cardiology and symptoms felt to be noncardiac. No further cardiac w/u/ ischemic testing recommended. She was also treated for abdominal wall cellulitis and abscess, secondary to self administered insulin injects. She was evaluated by general surgery, abscess drained at bedside and treated w/ abx.   Readmit 12/23 for acute CHF. P/w dyspnea and leg swelling. CXR showed pulmonary edema. EKG showed NSR. Also felt to have LE cellulitis, treated w/ course of abx. For HF management, she was diuresed w/ IV Lasix.   After diuresis, she was transitioned to PO diuretics, Lasix w/ alternating doses of 40 and 60 mg. Also recommended that she be set up for formal sleep study for OSA assessment. Referred to [facility] clinic. D/c wt 315 lb. She had initially failed to show up for her first TOC visit and rescheduled multiple times. She was ultimately seen on 06/05/22 and lasix was increased to 60 mg daily given volume overload. She was also agreeable to undergo repeat sleep study evaluation. She was supposed to f/u in 2 wks but candled that appt and rescheduled 3 other times.   She finally presents back today for f/u. Reports increased resting dyspnea and dyspnea on exertion. She is not taking lasix 60 mg daily as previously directed. She is only taking 40 mg daily but compliant w/ all other meds. Reports LEE and some wheezing. BP 134/80. Hasn't yet taken am meds but  plans to take when she returns home. She would like to switch from coumadin to [place] given transporation issues getting coumadin clinic for frequent INR checks.   BP 118/86. EKG shows NSR 81 bpm. She was scheduled to get sleep study yesterday but had to reschedule.   Still on ozempic and reports losing 15 lb but continues to struggle w/ wt loss given inability to move and exercise due to severe LBP from DJD.    Cardiac Testing   Echo 10/23 1. Left ventricular ejection fraction, by estimation, is 55 to 60%. The  left ventricle has normal function. Left ventricular endocardial border  not optimally defined to evaluate regional wall motion. The left  ventricular internal cavity size was mildly  dilated. Left ventricular diastolic parameters are consistent with Grade I  diastolic dysfunction (impaired relaxation). Elevated left atrial  pressure.   2. Right ventricular systolic function is normal. The right ventricular  size is normal. Tricuspid regurgitation signal is inadequate for assessing  PA pressure.   3. Left atrial size was severely dilated.   4. The mitral valve is normal in structure. Trivial mitral valve  regurgitation.   5. The aortic valve is tricuspid. There is mild calcification of the  aortic valve. Aortic valve regurgitation is not visualized. Aortic valve  sclerosis is present, with no evidence of aortic valve stenosis.    Review of Systems: [y] = yes, [ ]  = no   General: Weight gain [ ] ; Weight loss [ ] ; Anorexia [ ] ; Fatigue [ ] ; Fever [ ] ;  Chills [ ] ; Weakness [ ]   Cardiac: Chest pain/pressure [ ] ; Resting SOB [ Y]; Exertional SOB [ Y]; Orthopnea [ Y]; Pedal Edema [ Y]; Palpitations [ ] ; Syncope [ ] ; Presyncope [ ] ; Paroxysmal nocturnal dyspnea[ ]   Pulmonary: Cough [ ] ; Wheezing[ ] ; Hemoptysis[ ] ; Sputum [ ] ; Snoring [Y ]  GI: Vomiting[ ] ; Dysphagia[ ] ; Melena[ ] ; Hematochezia [ ] ; Heartburn[ ] ; Abdominal pain [ ] ; Constipation [ ] ; Diarrhea [ ] ; BRBPR [ ]   GU:  Hematuria[ ] ; Dysuria [ ] ; Nocturia[ ]   Vascular: Pain in legs with walking [ ] ; Pain in feet with lying flat [ ] ; Non-healing sores [ ] ; Stroke [ ] ; TIA [ ] ; Slurred speech [ ] ;  Neuro: Headaches[ ] ; Vertigo[ ] ; Seizures[ ] ; Paresthesias[ ] ;Blurred vision [ ] ; Diplopia [ ] ; Vision changes [ ]   Ortho/Skin: Arthritis [ Y]; Joint pain [ Y]; Muscle pain [ ] ; Joint swelling [ ] ; Back Pain [ Y]; Rash [ ]   Psych: Depression[ ] ; Anxiety[ ]   Heme: Bleeding problems [ ] ; Clotting disorders [ ] ; Anemia [ ]   Endocrine: Diabetes [Y ]; Thyroid dysfunction[ ]    Past Medical History:  Diagnosis Date   Anxiety    CHF (congestive heart failure)    Diabetes mellitus    Hypercholesteremia    Hypertension    Obesity    Seizures    Stroke     Current Outpatient Medications  Medication Sig Dispense Refill   albuterol (PROVENTIL) (2.5 MG/3ML) 0.083% nebulizer solution Inhale 3 mLs (2.5 mg total) into the lungs every 4 (four) hours as needed for shortness of breath. 75 mL 12   bisacodyl (FLEET) 10 MG/30ML ENEM Place 10 mg rectally daily.     busPIRone (BUSPAR) 10 MG tablet Take 20 mg by mouth 3 (three) times daily.     ezetimibe (ZETIA) 10 MG tablet Take 1 tablet by mouth daily.     fluticasone furoate-vilanterol (BREO ELLIPTA) 100-25 MCG/INH AEPB Inhale 1 puff into the lungs daily as needed (for shortness of breath).     furosemide (LASIX) 20 MG tablet Take 3 tablets (60 mg total) by mouth daily. 90 tablet 3   hydrOXYzine (ATARAX) 50 MG tablet Take 50 mg by mouth 2 (two) times daily.     ibuprofen (ADVIL) 800 MG tablet Take 800 mg by mouth every 8 (eight) hours as needed for mild pain or moderate pain.     insulin regular human CONCENTRATED (HUMULIN R) 500 UNIT/ML injection Inject 12-20 Units into the skin 3 (three) times daily before meals. Sliding scale 3 times daily     loratadine (CLARITIN) 10 MG tablet Take 10 mg by mouth daily.     losartan (COZAAR) 50 MG tablet Take 50 mg by mouth daily.      omeprazole (PRILOSEC) 40 MG capsule Take 40 mg by mouth at bedtime.     OZEMPIC, 0.25 OR 0.5 MG/DOSE, 2 MG/3ML SOPN Inject 0.5 mg into the skin once a week. Wednesday     phenytoin (DILANTIN) 100 MG ER capsule Take 400 mg by mouth daily.     polyethylene glycol powder (GLYCOLAX/MIRALAX) 17 GM/SCOOP powder Take 1 Container by mouth once. Takes 1 scoop per day     prazosin (MINIPRESS) 1 MG capsule Take 1 mg by mouth in the morning. Take 1mg  (1 tab) in AM. Take 2mg  (2 tab) at bedtime (per patient statement).     rosuvastatin (CRESTOR) 40 MG tablet Take 1 tablet by mouth daily.     tiZANidine (  ZANAFLEX) 4 MG capsule Take 4 mg by mouth 3 (three) times daily.     topiramate (TOPAMAX) 100 MG tablet Take 200 mg by mouth 2 (two) times daily.     trazodone (DESYREL) 300 MG tablet Take 300 mg by mouth at bedtime as needed for sleep.     warfarin (COUMADIN) 7.5 MG tablet Take 1 tablet (7.5 mg total) by mouth daily at 4 PM. (Patient taking differently: Take 3.75-7.5 mg by mouth See admin instructions. 3.75 mg ,Saturday and Sunday  7.5 mg Monday, Tuesday Wednesday, Thursday and Friday.) 30 tablet 0   No current facility-administered medications for this encounter.    Allergies  Allergen Reactions   Latex Other (See Comments)    Welts - cant wear them, but can be touched by someone who wears them.   Penicillins Hives and Itching    Tolerated ceftriaxone 10/2020 " facial swelling"      Social History   Socioeconomic History   Marital status: Widowed    Spouse name: Not on file   Number of children: 1   Years of education: 9   Highest education level: Not on file  Occupational History    Comment: gets social security  Tobacco Use   Smoking status: Never   Smokeless tobacco: Never  Vaping Use   Vaping Use: Never used  Substance and Sexual Activity   Alcohol use: No   Drug use: No   Sexual activity: Not on file  Other Topics Concern   Not on file  Social History Narrative   Not on file    Social Determinants of Health   Financial Resource Strain: Low Risk  (05/12/2022)   Overall Financial Resource Strain (CARDIA)    Difficulty of Paying Living Expenses: Not very hard  Food Insecurity: No Food Insecurity (06/05/2022)   Hunger Vital Sign    Worried About Running Out of Food in the Last Year: Never true    Ran Out of Food in the Last Year: Never true  Recent Concern: Food Insecurity - Food Insecurity Present (06/05/2022)   Hunger Vital Sign    Worried About Running Out of Food in the Last Year: Sometimes true    Ran Out of Food in the Last Year: Sometimes true  Transportation Needs: No Transportation Needs (05/12/2022)   PRAPARE - Hydrologist (Medical): No    Lack of Transportation (Non-Medical): No  Physical Activity: Not on file  Stress: Not on file  Social Connections: Not on file  Intimate Partner Violence: Not At Risk (02/15/2022)   Humiliation, Afraid, Rape, and Kick questionnaire    Fear of Current or Ex-Partner: No    Emotionally Abused: No    Physically Abused: No    Sexually Abused: No      Family History  Adopted: Yes    Vitals:   08/15/22 1037  BP: (!) 134/90  Pulse: 81  SpO2: 95%  Weight: (!) 142.9 kg (315 lb)     PHYSICAL EXAM: General:  obese and chronically ill appearing. No respiratory difficulty HEENT: normal Neck: supple. Short thick neck, unable to visualized JVD. Carotids 2+ bilat; no bruits. No lymphadenopathy or thyromegaly appreciated. Cor: PMI nondisplaced. Regular rate & rhythm. No rubs, gallops or murmurs. Lungs: clear Abdomen: soft, nontender, nondistended. No hepatosplenomegaly. No bruits or masses. Good bowel sounds. Extremities: no cyanosis, clubbing, rash, 1+ b/l LE edema Neuro: alert & oriented x 3, cranial nerves grossly intact. moves all 4 extremities w/o difficulty. Affect  pleasant.   ECG: NSR 81 bpm    ASSESSMENT & PLAN:  1. HFpEF - Echo 10/23 EF 55-60%, G1DD, RV normal  -  volume assessment difficult due to body habitus but appears at least mildly fluid overloaded given presence of leg edema and continued exertional dyspnea and mild orthopnea  - NYHA Class III-IIIb, confounded by obesity/deconditioning, OSA/OHS - Increase Lasix to 60 mg daily - Add spironolactone 12.5 mg daily  - Check BMP/BNP today  - We considered transition to [facility] but co-pay will be too high per pt ($45/mo)  - Continue Losartan 50 mg daily  - no SGLT2i given h/o prior GU infections/obesity and large panus - no ? blocker w/ borderline bradycardia  - continue sodium restriction  - advised to limit fluid intake < 2L/ day  - f/u w/ cardiology for further management   2. PAF - in NSR on EKG today  - on coumadin, INR followed by PCP  - we check monthly cost of DOACs but to expensive  3. Hypertension  - controlled on current regimen - adding spiro  - check BMP today   4. Type 2DM  - last Hgb A1c 10/23 was 8.3  - on insulin and Ozempic - per PCP   5.  OSA/OHS  - previously diagnosed but did not wear CPAP, ended up returning device - now agreeable to improve compliance. Will need repeat sleep study for new device (missed sleep study again, waiting to reschedule)  - pt educated on association between untreated OSA/OHS and HF/Afib and PH. Encouraged to stay compliant w/ CPAP/BiPAP once new device is obtained  - refer to pulmonology   6. Obesity  - Body mass index is 55.8 kg/m.  - chronic low back pain limits mobility and ability to lose wt, which complicates her OHS.   - now on Ozempic but cost expensive, relying on samples - will refer to [facility] for [facility] loss surgery evaluation    Referred to HFSW (PCP, Medications, Transportation, ETOH Abuse, Drug Abuse, Insurance, Financial ): Yes (food insecurity, food pantry items supplied)  Refer to Pharmacy: No  Refer to [place]: No Refer to Advanced Heart Failure Clinic: No  Refer to General Cardiology: Yes (plan to  send to [place], Dr. Stanford Breed)   Follow up  f/u w/ Cardiology

## 2022-08-15 NOTE — Progress Notes (Signed)
Referral to [facility] to Dr. Greer Pickerel for weight loss surgery:  [facility] Attn: Dr. Greer Pickerel Office: [phone] Fax: [phone]

## 2022-08-15 NOTE — Progress Notes (Signed)
Heart and Vascular Care Navigation  08/15/2022  Oretha Abby Sep 25, 1950 [id number]  Reason for Referral: Patient seen in HF TOC. Patient is participating in a Managed Medicaid Plan: No  Engaged with patient face to face for initial visit for Heart and Vascular Care Coordination.                                                                                                   Assessment:  Donna Snow is a 72 yo female who resides in a mobile home. She states that her daughter and son in law  recently moved in with her. She reports she is struggling with finances and they have been unable to assist due to lack of income. Patient states she receives $1749 monthly from [place] and $23 in food stamps. She shared that she recently spoke with [facility] and will begin receiving $75 monthly gift card for food or any other needs she identifies and hopeful it will relieve her a bit with the finances. She receives her medications through the mail order [place] and often has little to no co pays.  Patient states she does have some challenges with transportation but with her daughter and son in law able to assist she denies any immediate need.                              HRT/VAS Care Coordination     Patients Home Cardiology Office Heart Failure Clinic  HF [facility]   Outpatient Care Team Social Worker   Social Worker Name: Raquel Sarna, [place] [phone]   Living arrangements for the past 2 months Mobile Home   Lives with: Self; Adult Children   Patient Current Insurance Coverage Managed Medicare   Patient Has Concern With Paying Medical Bills No   Does Patient Have Prescription Coverage? Yes   Home Assistive Devices/Equipment Cane (specify quad or straight)   DME Agency [employer]   [place]       Social History:                                                                             [place]: Food Insecurity Present (08/15/2022)  Housing: Low Risk   (05/12/2022)  Transportation Needs: No Transportation Needs (08/15/2022)  Utilities: Not At Risk (08/15/2022)  Depression (PHQ2-9): High Risk (04/04/2022)  Financial Resource Strain: High Risk (08/15/2022)  Tobacco Use: Low Risk  (08/15/2022)    SDOH Interventions: Financial Resources:  Financial Strain Interventions: Other (Comment)   Food Insecurity:  Food Insecurity Interventions: Other (Comment) (H&V Food Bag)  Housing Insecurity:   N/a  Transportation:   Transportation Interventions: Intervention Not Indicated   Follow-up plan:  CSW provided patient with a H&V food  bag to bridge some food insecurity. Patient denies any other needs at this time. CSW provided supportive counseling and gave patient contact information if needed in the future. Raquel Sarna, [place], [place]

## 2022-08-16 ENCOUNTER — Telehealth (HOSPITAL_COMMUNITY): Payer: Self-pay

## 2022-08-16 DIAGNOSIS — I5032 Chronic diastolic (congestive) heart failure: Secondary | ICD-10-CM

## 2022-08-16 NOTE — Telephone Encounter (Signed)
Lab appointment made.

## 2022-08-16 NOTE — Telephone Encounter (Signed)
-----   Message from Consuelo Pandy, [place] sent at 08/15/2022  4:38 PM EDT ----- Labs stable. Needs f/u BMP in 7-10 days to recheck renal fx and K w/ med changes

## 2022-08-17 ENCOUNTER — Telehealth: Payer: Self-pay | Admitting: Specialist

## 2022-08-17 ENCOUNTER — Encounter: Payer: Self-pay | Admitting: Specialist

## 2022-08-17 NOTE — Telephone Encounter (Signed)
OT attempting to schedule time to assess needs for shower seat, called 3/18 and patient requested a call the following week, called on 4/1 and scheduled for 4/4, called on 4/4 to remind of visit and patient states bathroom is messy and daughter has [place], requesting visit on 4/12.  OT will call patient on 4/12 to remind of visit and ensure that all residents have been symptom free from [place] for at least 5 days prior to visit. Vangie Bicker, [place], OT/L [phone]

## 2022-08-23 ENCOUNTER — Ambulatory Visit (HOSPITAL_COMMUNITY)
Admission: RE | Admit: 2022-08-23 | Discharge: 2022-08-23 | Disposition: A | Discharge status: Home / Self Care | Payer: No Typology Code available for payment source | Source: Ambulatory Visit | Attending: Internal Medicine | Admitting: Internal Medicine

## 2022-08-23 DIAGNOSIS — I5032 Chronic diastolic (congestive) heart failure: Secondary | ICD-10-CM | POA: Insufficient documentation

## 2022-08-23 LAB — BASIC METABOLIC PANEL
Anion gap: 7 (ref 5–15)
BUN: 13 mg/dL (ref 8–23)
CO2: 27 mmol/L (ref 22–32)
Calcium: 8.8 mg/dL — ABNORMAL LOW (ref 8.9–10.3)
Chloride: 101 mmol/L (ref 98–111)
Creatinine, Ser: 0.82 mg/dL (ref 0.44–1.00)
GFR, Estimated: >60 mL/min (ref >60)
Glucose, Bld: 320 mg/dL — ABNORMAL HIGH (ref 70–99)
Potassium: 4.1 mmol/L (ref 3.5–5.1)
Sodium: 135 mmol/L (ref 135–145)

## 2022-08-24 ENCOUNTER — Telehealth (HOSPITAL_COMMUNITY): Payer: Self-pay

## 2022-08-24 NOTE — Telephone Encounter (Signed)
Patient aware of labs.  

## 2022-08-29 ENCOUNTER — Telehealth: Payer: Self-pay | Admitting: Specialist

## 2022-08-29 ENCOUNTER — Encounter: Payer: Self-pay | Admitting: Specialist

## 2022-08-29 NOTE — Telephone Encounter (Signed)
attempting to schedule time to assess needs for shower seat, called 3/18 and patient requested a call the following week, called on 4/1 and scheduled for 4/4, called on 4/4 to remind of visit and patient states bathroom is messy and daughter has COVID, requesting visit on 4/12,  called 4/12 not feeling well. visit set for 4/16 at 4 pm.  states neighbor has given her a tub seat, called on 4/16 and pt states she has an MD appt, requesting visit on 4/23 instead.visit set for 09/05/22 at 4 pm.  OT to call and confirm on 4/23. [name], MHA, OT/L [phone]

## 2022-08-31 ENCOUNTER — Institutional Professional Consult (permissible substitution): Payer: Medicare HMO | Admitting: Nurse Practitioner

## 2022-09-05 ENCOUNTER — Other Ambulatory Visit: Payer: Self-pay | Admitting: Specialist

## 2022-09-05 NOTE — Patient Outreach (Signed)
Aging Gracefully Program  OT FINAL Visit  09/05/2022  Donna Snow 15-Jul-1950 [id number]  Visit:  4- Fourth Visit  Start Time:  1608 End Time:  1638 Total Minutes:  30  Readiness to Change:  Readiness to Change Score: 8   Patient Education: Education Provided: Yes Education Details: educated patient on use of tub transfer bench in shower, educated patient on use of "Tips for Aging Materials engineer) Educated: Patient Comprehension: Verbalized Understanding  Goals:  Goals Addressed             This Visit's Progress    COMPLETED: Patient Stated   On track    Patient will improve safety with shower and commode transfers  Name _________________________________             Date__________________  OT ACTION PLAN: Functional Mobility  Target Problem Area:  Difficulty getting in and out of shower and on/off commode   Why Problem May Occur:     Decreased mobility  Back pain  Very high step into shower  No grab bars to hold onto while getting in and out of shower and on and off of commode      Target Goal(s):   Patient will transfer safely in and out of shower and on and off of commode.     STRATEGIES   Saving Your Energy DO:  Take breaks  Raise the height of surfaces   Remove tripping hazards  (Other):   (Other):   (Other):    Modifying your home environment and making it safe    DO:  Install grab bars in the bathroom - using grab bars at commode and to get in and out of shower.    Remove or strongly secure throw rugs  (Other):  use tub transfer bench to transfer in and out of shower and sit on shower seat in shower.  - recommended raising legs to make shower seat level.  Recommended reversing seat back so that she can face showerhead.  Son in law encouraged to call OT for assistance if needed.    (Other):   (Other):    Simplifying the way you set up tasks or daily routines DO:  Move slowly   (Other):   (Other):   (Other):   (Other):      PRACTICE  Based on what we have talked about, you are willing to try:  ________________________________________________________________  ________________________________________________________________  If a strategy does not work the first time, try it again (and again).  We may make some changes over the next few sessions, based on how they work.   Donna Snow ________________________________________________   4/23/24__________ Occupational Therapist          Date         COMPLETED: Patient Stated       Patient will improve independence and safety with home mobility using her hover round. Patient states that she is now able to drive her hover round.  Her daughter has assisted her in learning how to use her hover round.  Goal completed.      COMPLETED: Patient Stated       Patient will improve safety retrieving items off of the floor.  ACTION PLANNING - CUSTOM  Target Problem Area:  difficulty picking items up from floor.   Why Problem May Occur: Back pain Decreased balance Decreased grip Difficulty bending over  Target Goal: Patient will be able to pick up items from floor with confidence and without straining her back.  STRATEGIES Saving Your Energy: DO: DON'T:  Use reacher to pick up items from floor, overhead. Over reach in front of body or to sides of body.              Modifying your home environment and making it safe: DO: DON'T:                 Simplifying the way you set up tasks or daily routines: DO: DON'T:            Practice It is important to practice the strategies so we can determine if they will be effective in helping to reach your goal. Follow these specific recommendations:  Use reacher to pick up items up off floor. 2.  Use reacher to retrieve items from overhead cabinets. 3.  Do not over reach to your front or sides.  If a strategy does not work the first time, try it again and again (and maybe again). We may  make some changes over the next few sessions, based on how they work.   Donna Snow [phone]        COMPLETED: Patient Stated       Patient will improve independence with lower body dressing.   Name _____[name]__________       Date_2/8/24_________________  OT ACTION PLAN: Dressing  Target Problem Area:  Difficulty completing lower extremity dressing.   Why Problem May Occur:    Sitting on low surface (commode) Difficult to lean over Rushes through activity Decreased endurance            Target Goal(s):   Complete lower body dressing with increased safety and independence   STRATEGIES   Saving Your Energy DO:  Sit down to do tasks- a lightweight chair can be kept in the bathroom  X Use appropriate adaptive equipment (circle appropriate equipment): long-handled shoe horn, sock aide, dressing stick, reacher   X Keep all items you'll need within easy reach  (Other):   (Other):   (Other):    Modifying your home environment and making it safe    DO:                Simplifying the way you set up tasks or daily routines DO:  Wear sturdy shoes that grip the floor   Gather everything you will need before beginning  Wear clothing that is easily manipulated (elastic waistband, etc.)  (Other)   (Other):   (Other):     PRACTICE  Based on what we have talked about, you are willing to try:  _____use of Adaptive equipment____________________________________  ________________________________________________________________   If an idea does not work the first time, try it again (and again).  We may make some changes over the next few sessions, based on how they work.     __Beth Dayton Snow, MHA, OT/L________________    _____2/8/24______ Occupational Therapist          Date          Post Clinical Reasoning: Client Action (Goal) Four Interventions: Patient will improve safety with transferring in and out of shower  and on and off of commode. Did Client Try?: Yes Targeted Problem Area Status: A Little Better Clinician View Of Client Situation:: [name] continues to be cognizant of her health and has made and kept several health care appointments.  She continues to be upset by the state of her home and is hopeful that her family will help with cleaning her home in the near  future.  Ms. Lunsford is very pleased with the modifications in her home and states she feels much more safe and secure when completing bathroom transfers. Client View Of His/Her Situation:: Ms. Brickey is taking charge of her health and is taking steps to change her primariy care provider.  She feels safer in her home with the modifications and is pleased with her progress towards her goals. Next Visit Plan:: dc from skilled OT intervention this date.  Donna Snow [phone]

## 2022-09-13 ENCOUNTER — Institutional Professional Consult (permissible substitution): Payer: Medicare HMO | Admitting: Nurse Practitioner

## 2022-09-18 ENCOUNTER — Institutional Professional Consult (permissible substitution): Payer: Medicare HMO | Admitting: Pulmonary Disease

## 2022-09-20 ENCOUNTER — Encounter (HOSPITAL_COMMUNITY): Payer: Self-pay

## 2022-09-20 ENCOUNTER — Emergency Department (HOSPITAL_COMMUNITY)
Admission: EM | Admit: 2022-09-20 | Discharge: 2022-09-20 | Disposition: A | Discharge status: Home / Self Care | Payer: Medicare HMO | Attending: Emergency Medicine | Admitting: Emergency Medicine

## 2022-09-20 ENCOUNTER — Emergency Department (HOSPITAL_COMMUNITY): Payer: Medicare HMO

## 2022-09-20 ENCOUNTER — Other Ambulatory Visit: Payer: Self-pay

## 2022-09-20 DIAGNOSIS — Z794 Long term (current) use of insulin: Secondary | ICD-10-CM | POA: Insufficient documentation

## 2022-09-20 DIAGNOSIS — R0602 Shortness of breath: Secondary | ICD-10-CM | POA: Diagnosis not present

## 2022-09-20 DIAGNOSIS — I509 Heart failure, unspecified: Secondary | ICD-10-CM | POA: Insufficient documentation

## 2022-09-20 DIAGNOSIS — Z79899 Other long term (current) drug therapy: Secondary | ICD-10-CM | POA: Diagnosis not present

## 2022-09-20 DIAGNOSIS — Z1152 Encounter for screening for COVID-19: Secondary | ICD-10-CM | POA: Diagnosis not present

## 2022-09-20 DIAGNOSIS — E119 Type 2 diabetes mellitus without complications: Secondary | ICD-10-CM | POA: Diagnosis not present

## 2022-09-20 DIAGNOSIS — Z91040 Latex allergy status: Secondary | ICD-10-CM | POA: Diagnosis not present

## 2022-09-20 DIAGNOSIS — Z7901 Long term (current) use of anticoagulants: Secondary | ICD-10-CM | POA: Insufficient documentation

## 2022-09-20 DIAGNOSIS — Z8673 Personal history of transient ischemic attack (TIA), and cerebral infarction without residual deficits: Secondary | ICD-10-CM | POA: Insufficient documentation

## 2022-09-20 DIAGNOSIS — I110 Hypertensive heart disease with heart failure: Secondary | ICD-10-CM | POA: Insufficient documentation

## 2022-09-20 DIAGNOSIS — B372 Candidiasis of skin and nail: Secondary | ICD-10-CM

## 2022-09-20 DIAGNOSIS — R051 Acute cough: Secondary | ICD-10-CM

## 2022-09-20 DIAGNOSIS — R509 Fever, unspecified: Secondary | ICD-10-CM

## 2022-09-20 LAB — COMPREHENSIVE METABOLIC PANEL
ALT: 21 U/L (ref 0–44)
AST: 20 U/L (ref 15–41)
Albumin: 3 g/dL — ABNORMAL LOW (ref 3.5–5.0)
Alkaline Phosphatase: 114 U/L (ref 38–126)
Anion gap: 9 (ref 5–15)
BUN: 11 mg/dL (ref 8–23)
CO2: 23 mmol/L (ref 22–32)
Calcium: 8.3 mg/dL — ABNORMAL LOW (ref 8.9–10.3)
Chloride: 102 mmol/L (ref 98–111)
Creatinine, Ser: 0.75 mg/dL (ref 0.44–1.00)
GFR, Estimated: >60 mL/min (ref >60)
Glucose, Bld: 95 mg/dL (ref 70–99)
Potassium: 3.3 mmol/L — ABNORMAL LOW (ref 3.5–5.1)
Sodium: 134 mmol/L — ABNORMAL LOW (ref 135–145)
Total Bilirubin: 0.3 mg/dL (ref 0.3–1.2)
Total Protein: 7.1 g/dL (ref 6.5–8.1)

## 2022-09-20 LAB — I-STAT CHEM 8, ED
BUN: 10 mg/dL (ref 8–23)
Calcium, Ion: 1.16 mmol/L (ref 1.15–1.40)
Chloride: 104 mmol/L (ref 98–111)
Creatinine, Ser: 0.7 mg/dL (ref 0.44–1.00)
Glucose, Bld: 97 mg/dL (ref 70–99)
HCT: 39 % (ref 36.0–46.0)
Hemoglobin: 13.3 g/dL (ref 12.0–15.0)
Potassium: 3.4 mmol/L — ABNORMAL LOW (ref 3.5–5.1)
Sodium: 139 mmol/L (ref 135–145)
TCO2: 23 mmol/L (ref 22–32)

## 2022-09-20 LAB — CBC WITH DIFFERENTIAL/PLATELET
Abs Immature Granulocytes: 0.04 10*3/uL (ref 0.00–0.07)
Basophils Absolute: 0 10*3/uL (ref 0.0–0.1)
Basophils Relative: 1 %
Eosinophils Absolute: 0.2 10*3/uL (ref 0.0–0.5)
Eosinophils Relative: 4 %
HCT: 39.3 % (ref 36.0–46.0)
Hemoglobin: 13.2 g/dL (ref 12.0–15.0)
Immature Granulocytes: 1 %
Lymphocytes Relative: 29 %
Lymphs Abs: 1.9 10*3/uL (ref 0.7–4.0)
MCH: 28.1 pg (ref 26.0–34.0)
MCHC: 33.6 g/dL (ref 30.0–36.0)
MCV: 83.6 fL (ref 80.0–100.0)
Monocytes Absolute: 0.7 10*3/uL (ref 0.1–1.0)
Monocytes Relative: 10 %
Neutro Abs: 3.6 10*3/uL (ref 1.7–7.7)
Neutrophils Relative %: 55 %
Platelets: 266 10*3/uL (ref 150–400)
RBC: 4.7 MIL/uL (ref 3.87–5.11)
RDW: 13.7 % (ref 11.5–15.5)
WBC: 6.5 10*3/uL (ref 4.0–10.5)
nRBC: 0 % (ref 0.0–0.2)

## 2022-09-20 LAB — URINALYSIS, W/ REFLEX TO CULTURE (INFECTION SUSPECTED)
Bilirubin Urine: NEGATIVE
Glucose, UA: NEGATIVE mg/dL
Hgb urine dipstick: NEGATIVE
Ketones, ur: NEGATIVE mg/dL
Leukocytes,Ua: NEGATIVE
Nitrite: NEGATIVE
Protein, ur: NEGATIVE mg/dL
Specific Gravity, Urine: 1.015 (ref 1.005–1.030)
pH: 6 (ref 5.0–8.0)

## 2022-09-20 LAB — PROTIME-INR
INR: 1.6 — ABNORMAL HIGH (ref 0.8–1.2)
Prothrombin Time: 19.1 seconds — ABNORMAL HIGH (ref 11.4–15.2)

## 2022-09-20 LAB — APTT: aPTT: 36 seconds (ref 24–36)

## 2022-09-20 LAB — PHENYTOIN LEVEL, TOTAL: Phenytoin Lvl: 9.4 ug/mL — ABNORMAL LOW (ref 10.0–20.0)

## 2022-09-20 LAB — BRAIN NATRIURETIC PEPTIDE: B Natriuretic Peptide: 21.1 pg/mL (ref 0.0–100.0)

## 2022-09-20 LAB — SARS CORONAVIRUS 2 BY RT PCR: SARS Coronavirus 2 by RT PCR: NEGATIVE

## 2022-09-20 LAB — LACTIC ACID, PLASMA: Lactic Acid, Venous: 1.2 mmol/L (ref 0.5–1.9)

## 2022-09-20 MED ORDER — DOXYCYCLINE HYCLATE 100 MG PO CAPS: 100.0000 mg | ORAL_CAPSULE | Freq: Two times a day (BID) | ORAL | 0 refills | Status: DC

## 2022-09-20 MED ORDER — IPRATROPIUM-ALBUTEROL 0.5-2.5 (3) MG/3ML IN SOLN
3.0000 mL | Freq: Once | RESPIRATORY_TRACT | Status: AC
  Administered 2022-09-20: 3 mL via RESPIRATORY_TRACT
  Filled 2022-09-20: qty 3

## 2022-09-20 MED ORDER — ACETAMINOPHEN 325 MG PO TABS
650.0000 mg | ORAL_TABLET | Freq: Once | ORAL | Status: AC
  Administered 2022-09-20: 650 mg via ORAL
  Filled 2022-09-20: qty 2

## 2022-09-20 MED ORDER — NYSTATIN 100000 UNIT/GM EX POWD
Freq: Once | CUTANEOUS | Status: AC
  Filled 2022-09-20: qty 15

## 2022-09-20 MED ORDER — ALBUTEROL SULFATE (2.5 MG/3ML) 0.083% IN NEBU
5.0000 mg | INHALATION_SOLUTION | Freq: Once | RESPIRATORY_TRACT | Status: AC
  Administered 2022-09-20: 5 mg via RESPIRATORY_TRACT
  Filled 2022-09-20: qty 6

## 2022-09-20 MED ORDER — NYSTATIN 100000 UNIT/ML MT SUSP: 500000.0000 [IU] | Freq: Four times a day (QID) | OROMUCOSAL | 3 refills | Status: DC

## 2022-09-20 MED ORDER — ALBUTEROL SULFATE HFA 108 (90 BASE) MCG/ACT IN AERS: 2.0000 | INHALATION_SPRAY | RESPIRATORY_TRACT | 3 refills | Status: AC | PRN

## 2022-09-20 MED ORDER — PREDNISONE 20 MG PO TABS
60.0000 mg | ORAL_TABLET | Freq: Once | ORAL | Status: AC
  Administered 2022-09-20: 60 mg via ORAL
  Filled 2022-09-20: qty 3

## 2022-09-20 MED ORDER — DOXYCYCLINE HYCLATE 100 MG PO TABS
100.0000 mg | ORAL_TABLET | Freq: Once | ORAL | Status: AC
  Administered 2022-09-20: 100 mg via ORAL
  Filled 2022-09-20: qty 1

## 2022-09-20 MED ORDER — FUROSEMIDE 10 MG/ML IJ SOLN
40.0000 mg | INTRAMUSCULAR | Status: AC
  Administered 2022-09-20: 40 mg via INTRAVENOUS
  Filled 2022-09-20: qty 4

## 2022-09-20 MED ORDER — BENZONATATE 100 MG PO CAPS: 100.0000 mg | ORAL_CAPSULE | Freq: Three times a day (TID) | ORAL | 0 refills | Status: DC

## 2022-09-20 NOTE — ED Provider Notes (Addendum)
This patient is a very pleasant 72 year old female, she has a history of diabetes, congestive heart failure, hypertension and is obese, she has been around 2 family members who have both had upper respiratory infections, she had a daughter and a grandchild who both had symptoms of runny nose coughing and fevers.  She presents today with a low-grade temperature, she feels like she is short of breath and coughing frequently and states she cannot get any rest because of the cough.  On exam the patient has minimal expiratory wheezing (known asthmatic) has bilateral lower extremity edema which she states is chronic and for which she takes diuretic treatments.  She has an unremarkable x-ray, BNP is normal, CBC shows no leukocytosis COVID test is negative, no signs of pneumonia on x-ray.  The patient is high risk secondary to her underlying asthma, she has ambulated and has not desaturated, she has received multiple breathing treatments and overall appears very stable for discharge.  Have given dose of doxycycline due to her high risk nature, she will be discharged with same, she is agreeable.  Will avoid steroid therapy given that this is likely infectious cause of her reactive airway disease (as she is a diabetic)  The patient reports that she usually does not ambulate that much, she was able to ambulate with her oxygen dropping only to 93%.  She states this is very close to her baseline.  She is not tachycardic, fever has defervesced, I have reviewed all of the patient's labs and imaging with her and she is agreeable to the plan of doxycycline, Tessalon, albuterol, home with follow-up.  As a side note the patient does have a large pannus is, she is a diabetic, she has what appears to be some moderate signs of yeast infections between the pannus is not in her groin.  There is no signs of bacterial infection, it is the typical moist red serpiginous bordered skin consistent with a dermatologic yeast  infection.  Nystatin applied and prescribed   Eber Hong, MD 09/20/22 [id number]    Eber Hong, MD 09/20/22 [phone]

## 2022-09-20 NOTE — ED Notes (Addendum)
Pt stated she has a hx of atrial flutter, takes blood thinners daily. Warfarin.

## 2022-09-20 NOTE — Discharge Instructions (Addendum)
Tessalon is a cough medication that helps reduce the amount of coughing that you are having.  You may take up to 200 mg every 8 hours as needed.  This can be used safely with most or other over-the-counter medications but talk to your pharmacist before taking anything else over-the-counter with it  Albuterol is an inhaled medication which can help you to breathe better, you should take 2 puffs every 4 hours as needed, this may cause your heart to feel like it is racing, this should be a temporary side effect.  Doxycycline is an antibiotic which is taken twice a day, this treats bacterial infections that can cause staph infections, it treats sinus infections and some pneumonia.  In this case I would like for you to take the antibiotic exactly as prescribed until it is completed.  Please be aware that occasionally people will get a rash if they are in the sunlight for extended periods of time while taking this medicine.  Thank you for allowing [place] to treat you in the emergency department today.  After reviewing your examination and potential testing that was done it appears that you are safe to go home.  I would like for you to follow-up with your doctor within the next several days, have them obtain your results and follow-up with them to review all of these tests.  If you should develop severe or worsening symptoms return to the emergency department immediately

## 2022-09-20 NOTE — ED Notes (Signed)
Patient verbalizes understanding of discharge instructions. Opportunity for questioning and answers were provided. Pt discharged from ED. 

## 2022-09-20 NOTE — ED Provider Notes (Signed)
[place] EMERGENCY DEPARTMENT AT [facility] Provider Note   CSN: [id number] Arrival date & time: 09/20/22  0551     History  Chief Complaint  Patient presents with   Shortness of Breath    Donna Snow is a 72 y.o. female.  The history is provided by the patient.  Patient w/history of CHF, diabetes, obesity, hypertension presents with shortness of breath.  Patient reports 3 days of productive cough.  She is also having wheezing and shortness of breath.  Reports she is having pain throughout her ribs from coughing.  EMS gave DuoNebs en route.    Past Medical History:  Diagnosis Date   Anxiety    CHF (congestive heart failure) (HCC)    Diabetes mellitus    Hypercholesteremia    Hypertension    Obesity    Seizures (HCC)    Stroke (HCC)     Home Medications Prior to Admission medications   Medication Sig Start Date End Date Taking? Authorizing Provider  albuterol (PROVENTIL) (2.5 MG/3ML) 0.083% nebulizer solution Inhale 3 mLs (2.5 mg total) into the lungs every 4 (four) hours as needed for shortness of breath. 05/12/22   Danford, Earl Lites, MD  bisacodyl (FLEET) 10 MG/30ML ENEM Place 10 mg rectally daily.    [provider]  busPIRone (BUSPAR) 10 MG tablet Take 20 mg by mouth 3 (three) times daily. 08/16/20   [provider]  ezetimibe (ZETIA) 10 MG tablet Take 1 tablet by mouth daily. 07/25/18   [provider]  fluticasone furoate-vilanterol (BREO ELLIPTA) 100-25 MCG/INH AEPB Inhale 1 puff into the lungs daily as needed (for shortness of breath). 03/08/20   [provider]  furosemide (LASIX) 20 MG tablet Take 3 tablets (60 mg total) by mouth daily. 08/15/22   Allayne Butcher, PA-C  hydrOXYzine (ATARAX) 50 MG tablet Take 50 mg by mouth 2 (two) times daily.    [provider]  ibuprofen (ADVIL) 800 MG tablet Take 800 mg by mouth every 8 (eight) hours as needed for mild pain or moderate pain.    [provider]  insulin regular human CONCENTRATED (HUMULIN R) 500 UNIT/ML injection Inject 12-20 Units into the skin 3 (three) times daily before meals. Sliding scale 3 times daily    [provider]  loratadine (CLARITIN) 10 MG tablet Take 10 mg by mouth daily.    [provider]  losartan (COZAAR) 50 MG tablet Take 50 mg by mouth daily.    [provider]  omeprazole (PRILOSEC) 40 MG capsule Take 40 mg by mouth at bedtime. 06/05/20   [provider]  OZEMPIC, 0.25 OR 0.5 MG/DOSE, 2 MG/3ML SOPN Inject 0.5 mg into the skin once a week. Wednesday 12/05/21   [provider]  phenytoin (DILANTIN) 100 MG ER capsule Take 400 mg by mouth daily.    [provider]  polyethylene glycol powder (GLYCOLAX/MIRALAX) 17 GM/SCOOP powder Take 1 Container by mouth once. Takes 1 scoop per day    [provider]  prazosin (MINIPRESS) 1 MG capsule Take 1 mg by mouth in the morning. Take 1mg  (1 tab) in AM. Take 2mg  (2 tab) at bedtime (per patient statement).    [provider]  rosuvastatin (CRESTOR) 40 MG tablet Take 1 tablet by mouth daily. 12/22/18   [provider]  spironolactone (ALDACTONE) 25 MG tablet Take 0.5 tablets (12.5 mg total) by mouth daily. 08/15/22   Robbie Lis M, PA-C  tiZANidine (ZANAFLEX) 4 MG capsule  Take 4 mg by mouth 3 (three) times daily.    [provider]  topiramate (TOPAMAX) 100 MG tablet Take 200 mg by mouth 2 (two) times daily. 06/06/20   [provider]  trazodone (DESYREL) 300 MG tablet Take 300 mg by mouth at bedtime as needed for sleep.    [provider]  warfarin (COUMADIN) 7.5 MG tablet Take 1 tablet (7.5 mg total) by mouth daily at 4 PM. Patient taking differently: Take 3.75-7.5 mg by mouth See admin instructions. 3.75 mg ,Saturday and Sunday  7.5 mg Monday, Tuesday Wednesday, Thursday and Friday. 10/22/20 02/14/23  Johnson, Clanford L, MD  atorvastatin (LIPITOR) 40 MG tablet Take 40 mg  by mouth daily.  10/21/20  [provider]  citalopram (CELEXA) 20 MG tablet Take by mouth. Patient not taking: Reported on 10/15/2020  10/21/20  [provider]  dicyclomine (BENTYL) 10 MG capsule Take by mouth. Patient not taking: Reported on 10/15/2020  10/21/20  [provider]  fexofenadine (ALLEGRA) 180 MG tablet Take 180 mg by mouth daily.    11/27/19  [provider]  glipiZIDE (GLUCOTROL) 10 MG tablet Take 10 mg by mouth 2 (two) times daily before a meal.    11/27/19  [provider]  lisinopril-hydrochlorothiazide (PRINZIDE,ZESTORETIC) 10-12.5 MG per tablet Take 1 tablet by mouth daily.   Patient not taking: No sig reported  10/21/20  [provider]  sitaGLIPtan-metformin (JANUMET) 50-1000 MG per tablet Take 1 tablet by mouth 2 (two) times daily with a meal.    11/27/19  [provider]  zolpidem (AMBIEN) 10 MG tablet Take 10 mg by mouth at bedtime as needed.    11/27/19  [provider]      Allergies    Latex and Penicillins    Review of Systems   Review of Systems  Constitutional:  Positive for fever.  Respiratory:  Positive for cough and wheezing.     Physical Exam Updated Vital Signs BP (!) 149/61   Pulse 93   Temp (!) 100.8 F (38.2 C) (Axillary)   Ht 1.651 m (5\' 5" )   Wt 134.7 kg   SpO2 95%   BMI 49.42 kg/m  Physical Exam CONSTITUTIONAL: Ill-appearing, anxious HEAD: Normocephalic/atraumatic EYES: EOMI/PERRL ENMT: Mucous membranes moist NECK: supple no meningeal signs CV: S1/S2 noted, no murmurs/rubs/gallops noted LUNGS: Scattered wheezing bilaterally ABDOMEN: soft, nontender NEURO: Pt is awake/alert/appropriate, moves all extremitiesx4.  No facial droop.   EXTREMITIES: pulses normal/equal, full ROM SKIN: warm, color normal PSYCH: Mildly anxious  ED Results / Procedures / Treatments   Labs (all labs ordered are listed, but only abnormal results are displayed) Labs Reviewed  CULTURE, BLOOD  (ROUTINE X 2)  CULTURE, BLOOD (ROUTINE X 2)  SARS CORONAVIRUS 2 BY RT PCR  LACTIC ACID, PLASMA  LACTIC ACID, PLASMA  COMPREHENSIVE METABOLIC PANEL  CBC WITH DIFFERENTIAL/PLATELET  PROTIME-INR  APTT  URINALYSIS, W/ REFLEX TO CULTURE (INFECTION SUSPECTED)  BRAIN NATRIURETIC PEPTIDE  PHENYTOIN LEVEL, TOTAL  I-STAT CHEM 8, ED    EKG EKG Interpretation  Date/Time:  Wednesday Sep 20 2022 05:59:11 EDT Ventricular Rate:  93 PR Interval:  180 QRS Duration: 87 QT Interval:  371 QTC Calculation: 462 R Axis:   -23 Text Interpretation: Sinus rhythm Borderline left axis deviation Low voltage, precordial leads Consider anterior infarct Confirmed by Zadie Rhine ([id number]) on 09/20/2022 6:05:11 AM  Radiology DG Chest Port 1 View  Result Date: 09/20/2022 CLINICAL DATA:  Encounter for sepsis EXAM: PORTABLE CHEST 1  VIEW COMPARISON:  05/10/2022 FINDINGS: Cardiomegaly with congested appearance of central vessels. Artifact from EKG leads. There is no edema, consolidation, effusion, or pneumothorax. IMPRESSION: Cardiomegaly and vascular congestion similar to prior. No focal pneumonia. Electronically Signed   By: Tiburcio Pea M.D.   On: 09/20/2022 06:35    Procedures Procedures    Medications Ordered in ED Medications  acetaminophen (TYLENOL) tablet 650 mg (has no administration in time range)  albuterol (PROVENTIL) (2.5 MG/3ML) 0.083% nebulizer solution 5 mg (has no administration in time range)    ED Course/ Medical Decision Making/ A&P Clinical Course as of 09/20/22 0650  Wed Sep 20, 2022  2440 Patient with history of A-fib, CHF, obesity presents with shortness of breath and cough.  She has noted to be febrile.  She has wheezing on exam and coarse breath sounds.  Will get albuterol.  X-ray reveals cardiomegaly, no obvious infiltrate. [DW]  [phone] Workup was just begun, will need treatment and reassessment.  Signed out to Dr. Hyacinth Meeker with labs pending.  Patient likely needs to be admitted [DW]     Clinical Course User Index [DW] Zadie Rhine, MD                             Medical Decision Making Amount and/or Complexity of Data Reviewed Labs: ordered. Radiology: ordered. ECG/medicine tests: ordered.  Risk OTC drugs. Prescription drug management.   This patient presents to the ED for concern of shortness of breath, this involves an extensive number of treatment options, and is a complaint that carries with it a high risk of complications and morbidity.  The differential diagnosis includes but is not limited to Acute coronary syndrome, pneumonia, acute pulmonary edema, pneumothorax, acute anemia, pulmonary embolism   Comorbidities that complicate the patient evaluation: Patient's presentation is complicated by their history of obesity, CHF  Social Determinants of Health: Patient's  food insecurity   increases the complexity of managing their presentation  Additional history obtained: Records reviewed previous admission documents   Imaging Studies ordered: I ordered imaging studies including X-ray chest    I independently visualized and interpreted imaging which showed cardiomegaly I agree with the radiologist interpretation  Cardiac Monitoring: The patient was maintained on a cardiac monitor.  I personally viewed and interpreted the cardiac monitor which showed an underlying rhythm of:  sinus rhythm  Medicines ordered and prescription drug management: I ordered medication including albuterol  for wheezing   Complexity of problems addressed: Patient's presentation is most consistent with  acute presentation with potential threat to life or bodily function  ropriate:1}      Final Clinical Impression(s) / ED Diagnoses Final diagnoses:  Shortness of breath    Rx / DC Orders ED Discharge Orders     None         Zadie Rhine, MD 09/20/22 [phone]

## 2022-09-20 NOTE — ED Triage Notes (Signed)
Pt presents with a dry productive cough for the past three days. Pt having pain in rib cage from coughing. Pt having expiratory wheezes upon auscultation per EMS. Pt given 2 duonebs en route. Pt is 94% on RA. After duonebs pt is staying around 98% on RA.

## 2022-09-21 LAB — BLOOD CULTURE ID PANEL (REFLEXED) - BCID2

## 2022-09-22 LAB — CULTURE, BLOOD (ROUTINE X 2): Special Requests: ADEQUATE

## 2022-09-23 ENCOUNTER — Telehealth (HOSPITAL_BASED_OUTPATIENT_CLINIC_OR_DEPARTMENT_OTHER): Payer: Self-pay | Admitting: *Deleted

## 2022-09-23 NOTE — Progress Notes (Signed)
ED Antimicrobial Stewardship Positive Culture Follow Up   Donna Snow is an 72 y.o. female who presented to [facility] on 09/20/2022 with a chief complaint of  Chief Complaint  Patient presents with   Shortness of Breath    Recent Results (from the past 720 hour(s))  Blood Culture (routine x 2)     Status: None (Preliminary result)   Collection Time: 09/20/22  6:35 AM   Specimen: BLOOD RIGHT FOREARM  Result Value Ref Range Status   Specimen Description BLOOD RIGHT FOREARM  Final   Special Requests   Final    BOTTLES DRAWN AEROBIC AND ANAEROBIC Blood Culture adequate volume   Culture   Final    NO GROWTH 2 DAYS Performed at [facility], [address], [place], [place] [address]    Report Status PENDING  Incomplete  Blood Culture (routine x 2)     Status: Abnormal   Collection Time: 09/20/22  6:38 AM   Specimen: BLOOD LEFT FOREARM  Result Value Ref Range Status   Specimen Description BLOOD LEFT FOREARM  Final   Special Requests   Final    BOTTLES DRAWN AEROBIC AND ANAEROBIC Blood Culture adequate volume   Culture  Setup Time   Final    GRAM POSITIVE COCCI IN CLUSTERS AEROBIC BOTTLE ONLY CRITICAL RESULT CALLED TO, READ BACK BY AND VERIFIED WITH: CHARGE NURSE MEGAN ROBERTS [id number] AT 923 AM BY CM    Culture (A)  Final    STAPHYLOCOCCUS EPIDERMIDIS THE SIGNIFICANCE OF ISOLATING THIS ORGANISM FROM A SINGLE SET OF BLOOD CULTURES WHEN MULTIPLE SETS ARE DRAWN IS UNCERTAIN. PLEASE NOTIFY THE MICROBIOLOGY DEPARTMENT WITHIN ONE WEEK IF SPECIATION AND SENSITIVITIES ARE REQUIRED. Performed at [facility], [address], [place], [place] [address]    Report Status 09/22/2022 FINAL  Final  Blood Culture ID Panel (Reflexed)     Status: Abnormal   Collection Time: 09/20/22  6:38 AM  Result Value Ref Range Status   Enterococcus faecalis NOT DETECTED NOT DETECTED Final   Enterococcus Faecium NOT DETECTED NOT DETECTED Final   Listeria monocytogenes NOT DETECTED NOT DETECTED  Final   Staphylococcus species DETECTED (A) NOT DETECTED Final    Comment: CRITICAL RESULT CALLED TO, READ BACK BY AND VERIFIED WITH: CHARGE NURSE MEGAN ROBERTS [id number] AT 923 AM BY CM    Staphylococcus aureus (BCID) NOT DETECTED NOT DETECTED Final   Staphylococcus epidermidis DETECTED (A) NOT DETECTED Final    Comment: Methicillin (oxacillin) resistant coagulase negative staphylococcus. Possible blood culture contaminant (unless isolated from more than one blood culture draw or clinical case suggests pathogenicity). No antibiotic treatment is indicated for blood  culture contaminants. CRITICAL RESULT CALLED TO, READ BACK BY AND VERIFIED WITH: CHARGE NURSE MEGAN ROBERTS [id number] AT 923 AM BY CM    Staphylococcus lugdunensis NOT DETECTED NOT DETECTED Final   Streptococcus species NOT DETECTED NOT DETECTED Final   Streptococcus agalactiae NOT DETECTED NOT DETECTED Final   Streptococcus pneumoniae NOT DETECTED NOT DETECTED Final   Streptococcus pyogenes NOT DETECTED NOT DETECTED Final   A.calcoaceticus-baumannii NOT DETECTED NOT DETECTED Final   Bacteroides fragilis NOT DETECTED NOT DETECTED Final   Enterobacterales NOT DETECTED NOT DETECTED Final   Enterobacter cloacae complex NOT DETECTED NOT DETECTED Final   Escherichia coli NOT DETECTED NOT DETECTED Final   Klebsiella aerogenes NOT DETECTED NOT DETECTED Final   Klebsiella oxytoca NOT DETECTED NOT DETECTED Final   Klebsiella pneumoniae NOT DETECTED NOT DETECTED Final   Proteus species  NOT DETECTED NOT DETECTED Final   Salmonella species NOT DETECTED NOT DETECTED Final   Serratia marcescens NOT DETECTED NOT DETECTED Final   Haemophilus influenzae NOT DETECTED NOT DETECTED Final   Neisseria meningitidis NOT DETECTED NOT DETECTED Final   Pseudomonas aeruginosa NOT DETECTED NOT DETECTED Final   Stenotrophomonas maltophilia NOT DETECTED NOT DETECTED Final   Candida albicans NOT DETECTED NOT DETECTED Final   Candida auris NOT DETECTED NOT  DETECTED Final   Candida glabrata NOT DETECTED NOT DETECTED Final   Candida krusei NOT DETECTED NOT DETECTED Final   Candida parapsilosis NOT DETECTED NOT DETECTED Final   Candida tropicalis NOT DETECTED NOT DETECTED Final   Cryptococcus neoformans/gattii NOT DETECTED NOT DETECTED Final   Methicillin resistance mecA/C DETECTED (A) NOT DETECTED Final    Comment: CRITICAL RESULT CALLED TO, READ BACK BY AND VERIFIED WITH: CHARGE NURSE MEGAN ROBERTS [id number] AT 923 AM BY CM Performed at [facility], [address], [place], [place] [address]   SARS Coronavirus 2 by RT PCR (hospital order, performed in [facility] hospital lab) *cepheid single result test* Anterior Nasal Swab     Status: None   Collection Time: 09/20/22  6:46 AM   Specimen: Anterior Nasal Swab  Result Value Ref Range Status   SARS Coronavirus 2 by RT PCR NEGATIVE NEGATIVE Final    Comment: Performed at [facility], [address] [place], [place] [address]    [x]  Blood culture is 1/2 staphylococcus epidermidis which is most likely contaminant. Please call patient to see if having symptoms of infection (fever, chills, nausea, vomiting, etc). If any concern for infection, patient should return to ED for evaluation.   ED Provider: Gwenlyn Perking, PharmD, BCPS 09/23/2022 10:34 AM ED Clinical Pharmacist -  [phone]

## 2022-09-23 NOTE — Telephone Encounter (Signed)
Post ED Visit - Positive Culture Follow-up  Culture report reviewed by antimicrobial stewardship pharmacist: [facility] []  Enzo Bi, Pharm.D. []  Celedonio Miyamoto, [address].D., BCPS AQ-ID []  Garvin Fila, Pharm.D., BCPS []  Georgina Pillion, Pharm.D., BCPS []  Wade Hampton, [address].D., BCPS, AAHIVP []  Estella Husk, Pharm.D., BCPS, AAHIVP []  Lysle Pearl, PharmD, BCPS []  Phillips Climes, PharmD, BCPS []  Agapito Games, PharmD, BCPS []  Verlan Friends, PharmD []  Mervyn Gay, PharmD, BCPS [x]  Riccardo Dubin, PharmD  [facility] []  Len Childs, PharmD []  Greer Pickerel, PharmD []  Adalberto Cole, PharmD []  Perlie Gold, Rph []  Lonell Face) Jean Rosenthal, PharmD []  Earl Many, PharmD []  Junita Push, PharmD []  Dorna Leitz, PharmD []  Terrilee Files, PharmD []  Lynann Beaver, PharmD []  Keturah Barre, PharmD []  Loralee Pacas, PharmD []  Bernadene Person, PharmD   Positive blood culture Spoke with patient. Pt is not having any signs of infection and culture likely contaminant per Dr. Gloris Manchester. No action needed at this time  Patsey Berthold 09/23/2022, 2:35 PM

## 2022-09-25 LAB — CULTURE, BLOOD (ROUTINE X 2)
Culture: NO GROWTH
Special Requests: ADEQUATE

## 2022-10-05 ENCOUNTER — Encounter: Payer: Self-pay | Admitting: Pulmonary Disease

## 2022-12-04 ENCOUNTER — Other Ambulatory Visit: Payer: Self-pay

## 2022-12-04 NOTE — Patient Outreach (Signed)
Aging Gracefully Program  12/04/2022  Donna Snow 05-20-1950 [id number]   [facility] Evaluation Interviewer made contact with patient. Aging Gracefully 5 month survey completed.    Vanice Sarah Care Management Assistant [phone]

## 2023-01-17 ENCOUNTER — Encounter: Payer: Self-pay | Admitting: Physician Assistant

## 2023-02-08 ENCOUNTER — Ambulatory Visit: Payer: Medicare PPO | Admitting: Physician Assistant

## 2023-02-08 ENCOUNTER — Ambulatory Visit: Payer: Medicare HMO

## 2023-03-07 ENCOUNTER — Other Ambulatory Visit: Payer: Self-pay

## 2023-03-07 ENCOUNTER — Encounter (HOSPITAL_COMMUNITY): Payer: Self-pay | Admitting: Emergency Medicine

## 2023-03-07 ENCOUNTER — Emergency Department (HOSPITAL_COMMUNITY)
Admission: EM | Admit: 2023-03-07 | Discharge: 2023-03-07 | Disposition: A | Discharge status: Home / Self Care | Payer: Medicare HMO | Attending: Emergency Medicine | Admitting: Emergency Medicine

## 2023-03-07 DIAGNOSIS — L02211 Cutaneous abscess of abdominal wall: Secondary | ICD-10-CM | POA: Insufficient documentation

## 2023-03-07 DIAGNOSIS — Z79899 Other long term (current) drug therapy: Secondary | ICD-10-CM | POA: Diagnosis not present

## 2023-03-07 DIAGNOSIS — Z91040 Latex allergy status: Secondary | ICD-10-CM | POA: Diagnosis not present

## 2023-03-07 DIAGNOSIS — Z7902 Long term (current) use of antithrombotics/antiplatelets: Secondary | ICD-10-CM | POA: Insufficient documentation

## 2023-03-07 DIAGNOSIS — A4901 Methicillin susceptible Staphylococcus aureus infection, unspecified site: Secondary | ICD-10-CM | POA: Diagnosis not present

## 2023-03-07 DIAGNOSIS — Z794 Long term (current) use of insulin: Secondary | ICD-10-CM | POA: Insufficient documentation

## 2023-03-07 DIAGNOSIS — Z8639 Personal history of other endocrine, nutritional and metabolic disease: Secondary | ICD-10-CM

## 2023-03-07 DIAGNOSIS — E119 Type 2 diabetes mellitus without complications: Secondary | ICD-10-CM | POA: Diagnosis not present

## 2023-03-07 MED ORDER — LIDOCAINE-EPINEPHRINE (PF) 2 %-1:200000 IJ SOLN
INTRAMUSCULAR | Status: AC
  Filled 2023-03-07: qty 20

## 2023-03-07 MED ORDER — DOXYCYCLINE HYCLATE 100 MG PO TABS
100.0000 mg | ORAL_TABLET | Freq: Once | ORAL | Status: AC
  Administered 2023-03-07: 100 mg via ORAL
  Filled 2023-03-07: qty 1

## 2023-03-07 MED ORDER — FLUCONAZOLE 200 MG PO TABS: ORAL_TABLET | ORAL | 0 refills | Status: DC

## 2023-03-07 MED ORDER — DOXYCYCLINE HYCLATE 100 MG PO CAPS: 100.0000 mg | ORAL_CAPSULE | Freq: Two times a day (BID) | ORAL | 0 refills | Status: DC

## 2023-03-07 MED ORDER — ACETAMINOPHEN 500 MG PO TABS
1000.0000 mg | ORAL_TABLET | Freq: Once | ORAL | Status: AC
  Administered 2023-03-07: 1000 mg via ORAL
  Filled 2023-03-07: qty 2

## 2023-03-07 NOTE — ED Triage Notes (Signed)
Pt came in because she was  in a lot since a couple days. She report the pain has been getting worse.

## 2023-03-07 NOTE — ED Provider Notes (Signed)
[place] EMERGENCY DEPARTMENT AT [facility] Provider Note   CSN: [id number] Arrival date & time: 03/07/23  0815     History  Chief Complaint  Patient presents with   Possible staph Infection    Donna Snow is a 72 y.o. female.  Pt c/o abdominal wall abscess. 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 In past few days has noted sore, red area to left abdominal wall. No drainage. No generalized abd pain or nausea/vomiting. States apart from the sore area, does not feel acutely ill or sick. No fever, chills or sweats.  No current abx use.   The history is provided by the patient and medical records.       Home Medications Prior to Admission medications   Medication Sig Start Date End Date Taking? Authorizing Provider  albuterol (PROVENTIL) (2.5 MG/3ML) 0.083% nebulizer solution Inhale 3 mLs (2.5 mg total) into the lungs every 4 (four) hours as needed for shortness of breath. 05/12/22   Danford, Earl Lites, MD  albuterol (VENTOLIN HFA) 108 (90 Base) MCG/ACT inhaler Inhale 2 puffs into the lungs every 4 (four) hours as needed for wheezing or shortness of breath. 09/20/22   Eber Hong, MD  benzonatate (TESSALON) 100 MG capsule Take 1 capsule (100 mg total) by mouth every 8 (eight) hours. 09/20/22   Eber Hong, MD  bisacodyl (FLEET) 10 MG/30ML ENEM Place 10 mg rectally daily.    [provider]  busPIRone (BUSPAR) 10 MG tablet Take 20 mg by mouth 3 (three) times daily. 08/16/20   [provider]  doxycycline (VIBRAMYCIN) 100 MG capsule Take 1 capsule (100 mg total) by mouth 2 (two) times daily. 09/20/22   Eber Hong, MD  ezetimibe (ZETIA) 10 MG tablet Take 1 tablet by mouth daily. 07/25/18   [provider]  fluticasone furoate-vilanterol (BREO ELLIPTA) 100-25 MCG/INH AEPB Inhale 1 puff into the lungs daily as needed (for shortness of breath). 03/08/20   [provider]   furosemide (LASIX) 20 MG tablet Take 3 tablets (60 mg total) by mouth daily. 08/15/22   Allayne Butcher, PA-C  hydrOXYzine (ATARAX) 50 MG tablet Take 50 mg by mouth 2 (two) times daily.    [provider]  ibuprofen (ADVIL) 800 MG tablet Take 800 mg by mouth every 8 (eight) hours as needed for mild pain or moderate pain.    [provider]  insulin regular human CONCENTRATED (HUMULIN R) 500 UNIT/ML injection Inject 12-20 Units into the skin 3 (three) times daily before meals. Sliding scale 3 times daily    [provider]  loratadine (CLARITIN) 10 MG tablet Take 10 mg by mouth daily.    [provider]  losartan (COZAAR) 50 MG tablet Take 50 mg by mouth daily.    [provider]  nystatin (MYCOSTATIN) 100000 UNIT/ML suspension Take 5 mLs (500,000 Units total) by mouth 4 (four) times daily. 09/20/22   Eber Hong, MD  omeprazole (PRILOSEC) 40 MG capsule Take 40 mg by mouth at bedtime. 06/05/20   [provider]  OZEMPIC, 0.25 OR 0.5 MG/DOSE, 2 MG/3ML SOPN Inject 0.5 mg into the skin once a week. Wednesday 12/05/21   [provider]  phenytoin (DILANTIN) 100 MG ER capsule Take 400 mg by mouth daily.    [provider]  polyethylene glycol powder (GLYCOLAX/MIRALAX) 17 GM/SCOOP powder Take 1 Container by mouth once. Takes 1 scoop per day    [provider]  prazosin (MINIPRESS) 1 MG capsule Take 1 mg by mouth in the morning. Take 1mg  (1 tab) in AM. Take 2mg  (2 tab) at bedtime (per patient statement).    [provider]  rosuvastatin (CRESTOR) 40 MG tablet Take 1 tablet by mouth daily. 12/22/18   [provider]  spironolactone (ALDACTONE) 25 MG tablet Take 0.5 tablets (12.5 mg total) by mouth daily. 08/15/22   Robbie Lis M, PA-C  tiZANidine (ZANAFLEX) 4 MG capsule Take 4 mg by mouth 3 (three) times daily.    [provider]  topiramate (TOPAMAX) 100 MG tablet Take 200 mg by mouth 2 (two)  times daily. 06/06/20   [provider]  trazodone (DESYREL) 300 MG tablet Take 300 mg by mouth at bedtime as needed for sleep.    [provider]  warfarin (COUMADIN) 7.5 MG tablet Take 1 tablet (7.5 mg total) by mouth daily at 4 PM. Patient taking differently: Take 3.75-7.5 mg by mouth See admin instructions. 3.75 mg ,Saturday and Sunday  7.5 mg Monday, Tuesday Wednesday, Thursday and Friday. 10/22/20 02/14/23  Johnson, Clanford L, MD  atorvastatin (LIPITOR) 40 MG tablet Take 40 mg by mouth daily.  10/21/20  [provider]  citalopram (CELEXA) 20 MG tablet Take by mouth. Patient not taking: Reported on 10/15/2020  10/21/20  [provider]  dicyclomine (BENTYL) 10 MG capsule Take by mouth. Patient not taking: Reported on 10/15/2020  10/21/20  [provider]  fexofenadine (ALLEGRA) 180 MG tablet Take 180 mg by mouth daily.    11/27/19  [provider]  glipiZIDE (GLUCOTROL) 10 MG tablet Take 10 mg by mouth 2 (two) times daily before a meal.    11/27/19  [provider]  lisinopril-hydrochlorothiazide (PRINZIDE,ZESTORETIC) 10-12.5 MG per tablet Take 1 tablet by mouth daily.   Patient not taking: No sig reported  10/21/20  [provider]  sitaGLIPtan-metformin (JANUMET) 50-1000 MG per tablet Take 1 tablet by mouth 2 (two) times daily with a meal.    11/27/19  [provider]  zolpidem (AMBIEN) 10 MG tablet Take 10 mg by mouth at bedtime as needed.    11/27/19  [provider]      Allergies    Latex and Penicillins    Review of Systems   Review of Systems  Constitutional:  Negative for chills, diaphoresis and fever.  Respiratory:  Negative for shortness of breath.   Cardiovascular:  Negative for chest pain.  Gastrointestinal:  Negative for abdominal pain and vomiting.    Physical Exam Updated Vital Signs BP 128/61 (BP Location: Right Arm)   Pulse 78   Temp 98.1 F (36.7 C) (Oral)   Resp 20   Ht 1.651 m (5'  5")   Wt 136.1 kg   SpO2 95%   BMI 49.92 kg/m  Physical Exam Vitals and nursing note reviewed.  Constitutional:      Appearance: Normal appearance. She is well-developed.  HENT:     Head: Atraumatic.     Nose: Nose normal.     Mouth/Throat:     Mouth: Mucous membranes are moist.  Eyes:     General: No scleral icterus.    Conjunctiva/sclera: Conjunctivae normal.  Neck:     Trachea: No tracheal deviation.  Cardiovascular:     Rate and Rhythm: Normal rate and regular rhythm.     Pulses: Normal pulses.     Heart sounds: Normal heart sounds. No murmur heard.    No friction rub.  No gallop.  Pulmonary:     Effort: Pulmonary effort is normal. No respiratory distress.     Breath sounds: Normal breath sounds.  Abdominal:     General: Bowel sounds are normal. There is no distension.     Palpations: Abdomen is soft.     Comments: ~ 4 by 6 cm abscess to left abdominal wall. No crepitus. No necrotic or devitalized tissue. Mild surrounding erythema.   Musculoskeletal:        General: No swelling.     Cervical back: Normal range of motion and neck supple. No rigidity. No muscular tenderness.  Skin:    General: Skin is warm and dry.     Findings: No rash.  Neurological:     Mental Status: She is alert.     Comments: Alert, speech normal.   Psychiatric:        Mood and Affect: Mood normal.     ED Results / Procedures / Treatments   Labs (all labs ordered are listed, but only abnormal results are displayed) Labs Reviewed - No data to display  EKG None  Radiology No results found.  Procedures .Marland KitchenIncision and Drainage  Date/Time: 03/07/2023 9:12 AM  Performed by: Cathren Laine, MD Authorized by: Cathren Laine, MD   Consent:    Consent given by:  Patient Location:    Type:  Abscess   Location:  Trunk   Trunk location:  Abdomen Pre-procedure details:    Skin preparation:  Povidone-iodine Anesthesia:    Anesthesia method:  Local infiltration   Local anesthetic:   Lidocaine 2% WITH epi Procedure type:    Complexity:  Complex Procedure details:    Incision types:  Elliptical   Incision depth:  Subcutaneous   Wound management:  Probed and deloculated and irrigated with saline   Drainage:  Purulent   Drainage amount:  Moderate   Packing materials:  1/2 in iodoform gauze Post-procedure details:    Procedure completion:  Tolerated well, no immediate complications     Medications Ordered in ED Medications  lidocaine-EPINEPHrine (XYLOCAINE W/EPI) 2 %-1:200000 (PF) injection (has no administration in time range)  doxycycline (VIBRA-TABS) tablet 100 mg (has no administration in time range)  acetaminophen (TYLENOL) tablet 1,000 mg (has no administration in time range)    ED Course/ Medical Decision Making/ A&P                                 Medical Decision Making Problems Addressed: Abscess of abdominal wall: acute illness or injury with systemic symptoms 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  Risk OTC drugs. Prescription drug management.   I and D abscess. Sterile dressing.   Reviewed nursing notes and prior charts for additional history.   Doxycycline po.  Pt requests rx fluconazole for home (as after abx therapy, hx yeast vaginitis)  Acetaminophen po.  Vitals normal. No distress. No nv. No fever, chills, or sweats.   Pt currently appears stable for d/c.   Rec pcp f/u.  Return precautions provided.           Final Clinical Impression(s) / ED Diagnoses Final diagnoses:  None    Rx / DC Orders ED Discharge Orders     None         Cathren Laine, MD 03/07/23 [phone]

## 2023-03-07 NOTE — Discharge Instructions (Signed)
It was our pleasure to provide your ER care today - we hope that you feel better.  Take antibiotic as prescribed. Take acetaminophen as need.   Follow up with your doctor, or urgent care, in 2 days time for wound check and packing removal.   Return to ER if worse, new symptoms, high fevers, spreading redness, severe pain, persistent vomiting, or other concern.

## 2023-05-02 ENCOUNTER — Other Ambulatory Visit: Payer: Self-pay

## 2023-05-02 NOTE — Patient Outreach (Signed)
Aging Gracefully Program  05/02/2023  Rhianna Schwenk 1950/12/25 [id number]   [facility] Evaluation Interviewer attempted to call patient on today regarding Aging Gracefully referral. No answer from patient after multiple rings. CMA left confidential voicemail for patient to return call.  Will attempt to call back within 1 week.   Vanice Sarah Care Management Assistant [phone]

## 2023-05-03 ENCOUNTER — Other Ambulatory Visit: Payer: Self-pay

## 2023-05-03 NOTE — Patient Outreach (Signed)
Aging Gracefully Program  05/03/2023  Donna Snow 05-27-1950 [id number]   [facility] Evaluation Interviewer made contact with patient. Aging Gracefully survey completed.    Vanice Sarah Care Management Assistant [phone]

## 2023-05-13 ENCOUNTER — Inpatient Hospital Stay (HOSPITAL_COMMUNITY)
Admission: EM | Admit: 2023-05-13 | Discharge: 2023-05-29 | DRG: 872 | Disposition: A | Discharge status: Skilled Nursing Facility | Payer: Medicare HMO | Attending: Internal Medicine | Admitting: Internal Medicine

## 2023-05-13 ENCOUNTER — Emergency Department (HOSPITAL_COMMUNITY): Payer: Medicare HMO

## 2023-05-13 ENCOUNTER — Emergency Department (HOSPITAL_BASED_OUTPATIENT_CLINIC_OR_DEPARTMENT_OTHER): Payer: Medicare Other

## 2023-05-13 DIAGNOSIS — E1165 Type 2 diabetes mellitus with hyperglycemia: Secondary | ICD-10-CM | POA: Diagnosis present

## 2023-05-13 DIAGNOSIS — Z79899 Other long term (current) drug therapy: Secondary | ICD-10-CM

## 2023-05-13 DIAGNOSIS — E7800 Pure hypercholesterolemia, unspecified: Secondary | ICD-10-CM | POA: Diagnosis present

## 2023-05-13 DIAGNOSIS — Z7901 Long term (current) use of anticoagulants: Secondary | ICD-10-CM

## 2023-05-13 DIAGNOSIS — Z91040 Latex allergy status: Secondary | ICD-10-CM

## 2023-05-13 DIAGNOSIS — A419 Sepsis, unspecified organism: Principal | ICD-10-CM | POA: Diagnosis present

## 2023-05-13 DIAGNOSIS — E119 Type 2 diabetes mellitus without complications: Secondary | ICD-10-CM

## 2023-05-13 DIAGNOSIS — K5909 Other constipation: Secondary | ICD-10-CM | POA: Diagnosis present

## 2023-05-13 DIAGNOSIS — M7989 Other specified soft tissue disorders: Secondary | ICD-10-CM

## 2023-05-13 DIAGNOSIS — Z90710 Acquired absence of both cervix and uterus: Secondary | ICD-10-CM

## 2023-05-13 DIAGNOSIS — F411 Generalized anxiety disorder: Secondary | ICD-10-CM | POA: Diagnosis present

## 2023-05-13 DIAGNOSIS — N179 Acute kidney failure, unspecified: Secondary | ICD-10-CM | POA: Diagnosis present

## 2023-05-13 DIAGNOSIS — R739 Hyperglycemia, unspecified: Secondary | ICD-10-CM | POA: Diagnosis not present

## 2023-05-13 DIAGNOSIS — J4489 Other specified chronic obstructive pulmonary disease: Secondary | ICD-10-CM | POA: Diagnosis present

## 2023-05-13 DIAGNOSIS — I5032 Chronic diastolic (congestive) heart failure: Secondary | ICD-10-CM | POA: Diagnosis present

## 2023-05-13 DIAGNOSIS — M25462 Effusion, left knee: Secondary | ICD-10-CM | POA: Diagnosis present

## 2023-05-13 DIAGNOSIS — L309 Dermatitis, unspecified: Secondary | ICD-10-CM | POA: Diagnosis present

## 2023-05-13 DIAGNOSIS — L03116 Cellulitis of left lower limb: Principal | ICD-10-CM | POA: Diagnosis present

## 2023-05-13 DIAGNOSIS — E872 Acidosis, unspecified: Secondary | ICD-10-CM | POA: Diagnosis present

## 2023-05-13 DIAGNOSIS — G40909 Epilepsy, unspecified, not intractable, without status epilepticus: Secondary | ICD-10-CM | POA: Diagnosis present

## 2023-05-13 DIAGNOSIS — K30 Functional dyspepsia: Secondary | ICD-10-CM | POA: Diagnosis present

## 2023-05-13 DIAGNOSIS — G4733 Obstructive sleep apnea (adult) (pediatric): Secondary | ICD-10-CM

## 2023-05-13 DIAGNOSIS — R600 Localized edema: Secondary | ICD-10-CM

## 2023-05-13 DIAGNOSIS — Z7984 Long term (current) use of oral hypoglycemic drugs: Secondary | ICD-10-CM

## 2023-05-13 DIAGNOSIS — M545 Low back pain, unspecified: Secondary | ICD-10-CM | POA: Diagnosis present

## 2023-05-13 DIAGNOSIS — R262 Difficulty in walking, not elsewhere classified: Secondary | ICD-10-CM | POA: Diagnosis present

## 2023-05-13 DIAGNOSIS — I480 Paroxysmal atrial fibrillation: Secondary | ICD-10-CM | POA: Diagnosis present

## 2023-05-13 DIAGNOSIS — Z6841 Body Mass Index (BMI) 40.0 and over, adult: Secondary | ICD-10-CM

## 2023-05-13 DIAGNOSIS — Z880 Allergy status to penicillin: Secondary | ICD-10-CM

## 2023-05-13 DIAGNOSIS — I110 Hypertensive heart disease with heart failure: Secondary | ICD-10-CM | POA: Diagnosis present

## 2023-05-13 DIAGNOSIS — R700 Elevated erythrocyte sedimentation rate: Secondary | ICD-10-CM | POA: Diagnosis present

## 2023-05-13 DIAGNOSIS — W548XXA Other contact with dog, initial encounter: Secondary | ICD-10-CM

## 2023-05-13 DIAGNOSIS — Z96653 Presence of artificial knee joint, bilateral: Secondary | ICD-10-CM | POA: Diagnosis present

## 2023-05-13 DIAGNOSIS — F552 Abuse of laxatives: Secondary | ICD-10-CM | POA: Diagnosis present

## 2023-05-13 DIAGNOSIS — Z794 Long term (current) use of insulin: Secondary | ICD-10-CM

## 2023-05-13 DIAGNOSIS — K219 Gastro-esophageal reflux disease without esophagitis: Secondary | ICD-10-CM | POA: Diagnosis present

## 2023-05-13 DIAGNOSIS — G8929 Other chronic pain: Secondary | ICD-10-CM | POA: Diagnosis present

## 2023-05-13 DIAGNOSIS — Z8673 Personal history of transient ischemic attack (TIA), and cerebral infarction without residual deficits: Secondary | ICD-10-CM

## 2023-05-13 DIAGNOSIS — Z7985 Long-term (current) use of injectable non-insulin antidiabetic drugs: Secondary | ICD-10-CM

## 2023-05-13 DIAGNOSIS — G4700 Insomnia, unspecified: Secondary | ICD-10-CM | POA: Diagnosis present

## 2023-05-13 DIAGNOSIS — F514 Sleep terrors [night terrors]: Secondary | ICD-10-CM | POA: Diagnosis present

## 2023-05-13 DIAGNOSIS — M1120 Other chondrocalcinosis, unspecified site: Secondary | ICD-10-CM

## 2023-05-13 DIAGNOSIS — M11262 Other chondrocalcinosis, left knee: Secondary | ICD-10-CM | POA: Diagnosis present

## 2023-05-13 DIAGNOSIS — Z23 Encounter for immunization: Secondary | ICD-10-CM

## 2023-05-13 LAB — CBC WITH DIFFERENTIAL/PLATELET
Abs Immature Granulocytes: 0.08 10*3/uL — ABNORMAL HIGH (ref 0.00–0.07)
Basophils Absolute: 0 10*3/uL (ref 0.0–0.1)
Basophils Relative: 0 %
Eosinophils Absolute: 0 10*3/uL (ref 0.0–0.5)
Eosinophils Relative: 0 %
HCT: 38.3 % (ref 36.0–46.0)
Hemoglobin: 12.6 g/dL (ref 12.0–15.0)
Immature Granulocytes: 1 %
Lymphocytes Relative: 8 %
Lymphs Abs: 1.1 10*3/uL (ref 0.7–4.0)
MCH: 28.4 pg (ref 26.0–34.0)
MCHC: 32.9 g/dL (ref 30.0–36.0)
MCV: 86.5 fL (ref 80.0–100.0)
Monocytes Absolute: 0.7 10*3/uL (ref 0.1–1.0)
Monocytes Relative: 5 %
Neutro Abs: 11.7 10*3/uL — ABNORMAL HIGH (ref 1.7–7.7)
Neutrophils Relative %: 86 %
Platelets: 240 10*3/uL (ref 150–400)
RBC: 4.43 MIL/uL (ref 3.87–5.11)
RDW: 14.1 % (ref 11.5–15.5)
WBC: 13.6 10*3/uL — ABNORMAL HIGH (ref 4.0–10.5)
nRBC: 0 % (ref 0.0–0.2)

## 2023-05-13 LAB — COMPREHENSIVE METABOLIC PANEL
ALT: 23 U/L (ref 0–44)
AST: 20 U/L (ref 15–41)
Albumin: 2.6 g/dL — ABNORMAL LOW (ref 3.5–5.0)
Alkaline Phosphatase: 127 U/L — ABNORMAL HIGH (ref 38–126)
Anion gap: 11 (ref 5–15)
BUN: 26 mg/dL — ABNORMAL HIGH (ref 8–23)
CO2: 21 mmol/L — ABNORMAL LOW (ref 22–32)
Calcium: 8.3 mg/dL — ABNORMAL LOW (ref 8.9–10.3)
Chloride: 99 mmol/L (ref 98–111)
Creatinine, Ser: 1.23 mg/dL — ABNORMAL HIGH (ref 0.44–1.00)
GFR, Estimated: 47 mL/min — ABNORMAL LOW (ref >60)
Glucose, Bld: 344 mg/dL — ABNORMAL HIGH (ref 70–99)
Potassium: 4.5 mmol/L (ref 3.5–5.1)
Sodium: 131 mmol/L — ABNORMAL LOW (ref 135–145)
Total Bilirubin: 0.6 mg/dL (ref <1.2)
Total Protein: 7 g/dL (ref 6.5–8.1)

## 2023-05-13 LAB — GLUCOSE, CAPILLARY: Glucose-Capillary: 322 mg/dL — ABNORMAL HIGH (ref 70–99)

## 2023-05-13 LAB — APTT: aPTT: 60 s — ABNORMAL HIGH (ref 24–36)

## 2023-05-13 LAB — PROTIME-INR
INR: 2.9 — ABNORMAL HIGH (ref 0.8–1.2)
Prothrombin Time: 30.5 s — ABNORMAL HIGH (ref 11.4–15.2)

## 2023-05-13 LAB — I-STAT CG4 LACTIC ACID, ED: Lactic Acid, Venous: 1.9 mmol/L (ref 0.5–1.9)

## 2023-05-13 MED ORDER — INSULIN ASPART 100 UNIT/ML IJ SOLN
0.0000 [IU] | Freq: Three times a day (TID) | INTRAMUSCULAR | Status: DC
  Administered 2023-05-14: 8 [IU] via SUBCUTANEOUS
  Administered 2023-05-14: 5 [IU] via SUBCUTANEOUS
  Administered 2023-05-14 – 2023-05-15 (×2): 8 [IU] via SUBCUTANEOUS

## 2023-05-13 MED ORDER — WARFARIN SODIUM 2.5 MG PO TABS: 2.5000 mg | ORAL_TABLET | Freq: Every day | ORAL | Status: DC

## 2023-05-13 MED ORDER — PANTOPRAZOLE SODIUM 40 MG PO TBEC
40.0000 mg | DELAYED_RELEASE_TABLET | Freq: Every day | ORAL | Status: DC
  Administered 2023-05-14 – 2023-05-16 (×3): 40 mg via ORAL
  Filled 2023-05-13 (×3): qty 1

## 2023-05-13 MED ORDER — NYSTATIN 100000 UNIT/GM EX POWD
Freq: Two times a day (BID) | CUTANEOUS | Status: DC
Start: 2023-05-13 — End: 2023-05-29
  Administered 2023-05-23: 1 via TOPICAL
  Filled 2023-05-13 (×2): qty 15

## 2023-05-13 MED ORDER — PHENYTOIN SODIUM EXTENDED 100 MG PO CAPS
400.0000 mg | ORAL_CAPSULE | Freq: Every day | ORAL | Status: DC
  Administered 2023-05-14 – 2023-05-29 (×16): 400 mg via ORAL
  Filled 2023-05-13 (×17): qty 4

## 2023-05-13 MED ORDER — SODIUM CHLORIDE 0.9 % IV SOLN
2.0000 g | Freq: Two times a day (BID) | INTRAVENOUS | Status: DC
Start: 2023-05-13 — End: 2023-05-17
  Administered 2023-05-13 – 2023-05-17 (×8): 2 g via INTRAVENOUS
  Filled 2023-05-13 (×8): qty 12.5

## 2023-05-13 MED ORDER — EZETIMIBE 10 MG PO TABS
10.0000 mg | ORAL_TABLET | Freq: Every day | ORAL | Status: DC
  Administered 2023-05-14 – 2023-05-29 (×16): 10 mg via ORAL
  Filled 2023-05-13 (×16): qty 1

## 2023-05-13 MED ORDER — HYDROXYZINE HCL 25 MG PO TABS
50.0000 mg | ORAL_TABLET | Freq: Two times a day (BID) | ORAL | Status: DC | PRN
  Administered 2023-05-14 – 2023-05-27 (×13): 50 mg via ORAL
  Filled 2023-05-13 (×13): qty 2

## 2023-05-13 MED ORDER — SENNOSIDES-DOCUSATE SODIUM 8.6-50 MG PO TABS: 1.0000 | ORAL_TABLET | Freq: Every evening | ORAL | Status: DC | PRN

## 2023-05-13 MED ORDER — VANCOMYCIN HCL IN DEXTROSE 1-5 GM/200ML-% IV SOLN
1000.0000 mg | Freq: Once | INTRAVENOUS | Status: AC
  Administered 2023-05-13: 1000 mg via INTRAVENOUS
  Filled 2023-05-13: qty 200

## 2023-05-13 MED ORDER — PNEUMOCOCCAL 20-VAL CONJ VACC 0.5 ML IM SUSY
0.5000 mL | PREFILLED_SYRINGE | INTRAMUSCULAR | Status: AC
  Administered 2023-05-14: 0.5 mL via INTRAMUSCULAR
  Filled 2023-05-13: qty 0.5

## 2023-05-13 MED ORDER — OXYCODONE HCL 5 MG PO TABS
5.0000 mg | ORAL_TABLET | Freq: Four times a day (QID) | ORAL | Status: DC | PRN
  Administered 2023-05-13 – 2023-05-14 (×3): 5 mg via ORAL
  Filled 2023-05-13 (×3): qty 1

## 2023-05-13 MED ORDER — TRAZODONE HCL 50 MG PO TABS
150.0000 mg | ORAL_TABLET | Freq: Every evening | ORAL | Status: DC | PRN
  Administered 2023-05-13 – 2023-05-14 (×2): 150 mg via ORAL
  Filled 2023-05-13 (×2): qty 3

## 2023-05-13 MED ORDER — PRAZOSIN HCL 1 MG PO CAPS
1.0000 mg | ORAL_CAPSULE | Freq: Every morning | ORAL | Status: DC
  Administered 2023-05-14: 1 mg via ORAL
  Filled 2023-05-13: qty 1

## 2023-05-13 MED ORDER — ROSUVASTATIN CALCIUM 20 MG PO TABS
40.0000 mg | ORAL_TABLET | Freq: Every day | ORAL | Status: DC
  Administered 2023-05-14 – 2023-05-29 (×16): 40 mg via ORAL
  Filled 2023-05-13 (×16): qty 2

## 2023-05-13 MED ORDER — MORPHINE SULFATE (PF) 4 MG/ML IV SOLN
4.0000 mg | Freq: Once | INTRAVENOUS | Status: AC
  Administered 2023-05-13: 4 mg via INTRAVENOUS
  Filled 2023-05-13: qty 1

## 2023-05-13 MED ORDER — BUSPIRONE HCL 10 MG PO TABS
20.0000 mg | ORAL_TABLET | Freq: Three times a day (TID) | ORAL | Status: DC
  Administered 2023-05-13 – 2023-05-29 (×48): 20 mg via ORAL
  Filled 2023-05-13 (×48): qty 2

## 2023-05-13 MED ORDER — METRONIDAZOLE 500 MG/100ML IV SOLN
500.0000 mg | Freq: Once | INTRAVENOUS | Status: AC
Start: 2023-05-13 — End: 2023-05-13
  Administered 2023-05-13: 500 mg via INTRAVENOUS
  Filled 2023-05-13: qty 100

## 2023-05-13 MED ORDER — SODIUM CHLORIDE 0.9 % IV BOLUS
1000.0000 mL | Freq: Once | INTRAVENOUS | Status: AC
  Administered 2023-05-13: 1000 mL via INTRAVENOUS

## 2023-05-13 MED ORDER — ONDANSETRON HCL 4 MG/2ML IJ SOLN
4.0000 mg | Freq: Once | INTRAMUSCULAR | Status: AC
  Administered 2023-05-13: 4 mg via INTRAVENOUS
  Filled 2023-05-13: qty 2

## 2023-05-13 MED ORDER — SODIUM CHLORIDE 0.9 % IV SOLN
2.0000 g | Freq: Once | INTRAVENOUS | Status: AC
  Administered 2023-05-13: 2 g via INTRAVENOUS
  Filled 2023-05-13: qty 12.5

## 2023-05-13 MED ORDER — VANCOMYCIN HCL 750 MG/150ML IV SOLN
750.0000 mg | Freq: Two times a day (BID) | INTRAVENOUS | Status: DC
  Administered 2023-05-13 – 2023-05-14 (×2): 750 mg via INTRAVENOUS
  Filled 2023-05-13 (×3): qty 150

## 2023-05-13 MED ORDER — INFLUENZA VAC A&B SURF ANT ADJ 0.5 ML IM SUSY
0.5000 mL | PREFILLED_SYRINGE | INTRAMUSCULAR | Status: AC
  Administered 2023-05-14: 0.5 mL via INTRAMUSCULAR
  Filled 2023-05-13: qty 0.5

## 2023-05-13 MED ORDER — HYDROMORPHONE HCL 1 MG/ML IJ SOLN
0.5000 mg | Freq: Once | INTRAMUSCULAR | Status: AC
  Administered 2023-05-13: 0.5 mg via INTRAVENOUS
  Filled 2023-05-13: qty 1

## 2023-05-13 MED ORDER — TOPIRAMATE 100 MG PO TABS
200.0000 mg | ORAL_TABLET | Freq: Two times a day (BID) | ORAL | Status: DC
  Administered 2023-05-13 – 2023-05-29 (×32): 200 mg via ORAL
  Filled 2023-05-13: qty 2
  Filled 2023-05-13 (×6): qty 8
  Filled 2023-05-13: qty 2
  Filled 2023-05-13 (×20): qty 8
  Filled 2023-05-13: qty 2
  Filled 2023-05-13 (×2): qty 8
  Filled 2023-05-13: qty 2
  Filled 2023-05-13: qty 8

## 2023-05-13 MED ORDER — WARFARIN - PHARMACIST DOSING INPATIENT: Freq: Every day | Status: DC

## 2023-05-13 MED ORDER — ACETAMINOPHEN 500 MG PO TABS
1000.0000 mg | ORAL_TABLET | Freq: Three times a day (TID) | ORAL | Status: DC
  Administered 2023-05-13 – 2023-05-14 (×2): 1000 mg via ORAL
  Filled 2023-05-13 (×2): qty 2

## 2023-05-13 NOTE — H&P (Signed)
Date: 05/13/2023               Patient Name:  Donna Snow MRN: [id number]  DOB: 24-Sep-1950 Age / Sex: 72 y.o., female   PCP: [facility], [facility]         Medical Service: Internal Medicine Teaching Service         Attending Physician: Dr. Mayford Knife, Dorene Ar, MD      First Contact: Dr. Katheran James, DO Pager [phone]    Second Contact: Dr. Rana Snare, DO Pager [phone]         After Hours (After 5p/  First Contact Pager: [phone]  weekends / holidays): Second Contact Pager: [phone]   SUBJECTIVE   Chief Complaint: L Leg pain/swelling  History of Present Illness:  Ms. Donna Snow is a 72 year old female with a past medical history of type 2 diabetes who is presenting to [facility] with concerns of left leg pain and swelling.  She notes that her dog scratched her 1 week ago (family dog with all vaccinations) the anterior aspect of the left lower shin.  Since then she has noticed increasing redness and swelling of that leg, worsening most over the last 2 to 3 days.  She has tried antibiotic cream, she cannot remove exactly what, and did this for 2 days but then forgot to continue.  Now she endorses having pain of the left lower leg that stretches up to the left knee, all of which she attributes to the swelling.  She also endorses chills over the last week, but denies fever.  Not noticed any bleeding, draining, purulence, weeping.  Surrogate decision maker: Pamella Pert   ED Course: Initial vitals revealed heart rate of 104 and CBG 451 but otherwise stable.  Examination of left lower extremity concerning for cellulitis, with tenderness, swelling, erythema.  There is a white count of 13.6 with neutrophil predominance, there is a AKI with creatinine of 1.23 above a baseline of 0.8.  She was given Dilaudid for pain.  She was started on vancomycin, cefepime, metronidazole.  X-ray of the left tib-fib and ultrasound of the lower extremity were ordered revealing soft tissue  swelling and no evidence of DVT.  IMTS was asked for evaluation and admission for cellulitis.  Meds:  -albuterol inhaler PRN -buspar 20 mg BID (decreased by PCP) -Zetia 10 mg daily -Lasix 60 mg daily -Hydroxyzine 50 mg BID -Humulin R 11-20 units TID before meals -losartan 50 mg daily -omeprazole 40 mg daily -metformin -Ozempic 0.5 mg weekly -phenytoin 400 mg daily -prazosin 1 mg AM, 2mg  at bedtime  -rosuvastain 40 mg daily -spironolactone 12.5 mg daily -tizanidine 4 mg TID PRN (not often or recent) -topiramate 200 mg BID  -trazodone 300 mg at bedtime PRN -warfarin 3 mg daily  -Breo (no longer due to cost)   Past Medical History -chronic back pain  -atrial flutter -CHF -DM -HLD -HTN -Seizures  -CVA -depression -COPD    Past Surgical History:  Procedure Laterality Date   ABDOMINAL HYSTERECTOMY     ABDOMINAL SURGERY     CHOLECYSTECTOMY     JOINT REPLACEMENT     Social:  Lives With: daughter  Support: family  Level of Function: needs assistance by daughter at home due to chronic back pain but able to complete ADLs  PCP: Dr. Selena Batten [facility] [place]) Substances: denies tobacco, alcohol or recreational drug use  Family History: adopted   Allergies: Allergies as of 05/13/2023 - Review Complete 05/13/2023  Allergen Reaction  Noted   Latex Other (See Comments) 10/27/2013   Penicillins Hives and Itching 02/15/2011   Review of Systems: A complete ROS was negative except as per HPI.   OBJECTIVE:   Physical Exam: Blood pressure (!) 110/43, pulse 93, temperature 98.7 F (37.1 C), temperature source Oral, resp. rate 20, SpO2 94%.  Constitutional: Ill-appearing female. In no acute distress. HENT: Normocephalic, atraumatic,  Eyes: Sclera non-icteric, PERRL, EOM intact Neck:normal atraumatic, no neck masses, normal thyroid, no jvd Cardio:Regular rate and rhythm. No murmurs, rubs, or gallops. 2+ bilateral radial and dorsalis pedis  pulses. Pulm:Clear to  auscultation bilaterally. Normal work of breathing on room air. Abdomen: Soft, non-tender, non-distended, positive bowel sounds. XBJ:YNWGNFAO for L LE edema. + for nonpitting edema of the L lower leg. There is circumferential erythema above the L ankle and there is no weeping, bleeding, purulence. There is a well-healing abrasion on the anterior aspect of the leg, approximately 2 inches across, due to a dog scratch. Skin:Warm and dry. Neuro:Alert and oriented x3. No focal deficit noted. Psych:Pleasant mood and affect.  Labs: CBC    Component Value Date/Time   WBC 13.6 (H) 05/13/2023 0900   RBC 4.43 05/13/2023 0900   HGB 12.6 05/13/2023 0900   HCT 38.3 05/13/2023 0900   PLT 240 05/13/2023 0900   MCV 86.5 05/13/2023 0900   MCH 28.4 05/13/2023 0900   MCHC 32.9 05/13/2023 0900   RDW 14.1 05/13/2023 0900   LYMPHSABS 1.1 05/13/2023 0900   MONOABS 0.7 05/13/2023 0900   EOSABS 0.0 05/13/2023 0900   BASOSABS 0.0 05/13/2023 0900     CMP     Component Value Date/Time   NA 131 (L) 05/13/2023 0900   K 4.5 05/13/2023 0900   CL 99 05/13/2023 0900   CO2 21 (L) 05/13/2023 0900   GLUCOSE 344 (H) 05/13/2023 0900   BUN 26 (H) 05/13/2023 0900   CREATININE 1.23 (H) 05/13/2023 0900   CALCIUM 8.3 (L) 05/13/2023 0900   PROT 7.0 05/13/2023 0900   ALBUMIN 2.6 (L) 05/13/2023 0900   AST 20 05/13/2023 0900   ALT 23 05/13/2023 0900   ALKPHOS 127 (H) 05/13/2023 0900   BILITOT 0.6 05/13/2023 0900   GFRNONAA 47 (L) 05/13/2023 0900   GFRAA >60 01/05/2016 2234    Imaging: VAS [place] LOWER EXTREMITY VENOUS (DVT) (ONLY [facility] & [facility]) Result Date: 05/13/2023  Lower Venous DVT Study Patient Name:  KALLEN BASIC  Date of Exam:   05/13/2023 Medical Rec #: [id number]     Accession #:    [id number] Date of Birth: [birth year]     Patient Gender: F Patient Age:   72 years Exam Location:  [facility] Procedure:      VAS [place] LOWER EXTREMITY VENOUS (DVT) Referring Phys: Ivin Booty GEIPLE  --------------------------------------------------------------------------------  Indications: Swelling, and Edema.  Limitations: Body habitus and poor ultrasound/tissue interface. Comparison Study: No prior exam. Performing Technologist: Fernande Bras  Examination Guidelines: A complete evaluation includes B-mode imaging, spectral Doppler, color Doppler, and power Doppler as needed of all accessible portions of each vessel. Bilateral testing is considered an integral part of a complete examination. Limited examinations for reoccurring indications may be performed as noted. The reflux portion of the exam is performed with the patient in reverse Trendelenburg.  +-----+---------------+---------+-----------+----------+--------------+ RIGHTCompressibilityPhasicitySpontaneityPropertiesThrombus Aging +-----+---------------+---------+-----------+----------+--------------+ CFV  Full           Yes      Yes                                 +-----+---------------+---------+-----------+----------+--------------+  SFJ  Full           Yes      Yes                                 +-----+---------------+---------+-----------+----------+--------------+   +---------+---------------+---------+-----------+----------+-------------------+ LEFT     CompressibilityPhasicitySpontaneityPropertiesThrombus Aging      +---------+---------------+---------+-----------+----------+-------------------+ CFV      Full           Yes      Yes                                      +---------+---------------+---------+-----------+----------+-------------------+ SFJ      Full           Yes      Yes                                      +---------+---------------+---------+-----------+----------+-------------------+ FV Prox  Full                                                             +---------+---------------+---------+-----------+----------+-------------------+ FV Mid   Full                                                              +---------+---------------+---------+-----------+----------+-------------------+ FV DistalFull                                                             +---------+---------------+---------+-----------+----------+-------------------+ PFV      Full                                                             +---------+---------------+---------+-----------+----------+-------------------+ POP      Full           Yes      Yes                                      +---------+---------------+---------+-----------+----------+-------------------+ PTV                                                   Limited, patent by  color and doppler.  +---------+---------------+---------+-----------+----------+-------------------+ PERO                                                  Limited, patent by                                                        color and doppler.  +---------+---------------+---------+-----------+----------+-------------------+ Lymph node noted in left groin measuring 4.3 x 1.6 x 1.8 cm with slight vascularity. Limited due to patient's body habitus.    Summary: RIGHT: - No evidence of common femoral vein obstruction.   LEFT: - There is no evidence of deep vein thrombosis in the lower extremity.  - No cystic structure found in the popliteal fossa.  *See table(s) above for measurements and observations.    Preliminary    DG Tibia/Fibula Left Result Date: 05/13/2023 CLINICAL DATA:  Cellulitis EXAM: LEFT TIBIA AND FIBULA - 2 VIEW COMPARISON:  None Available. FINDINGS: Knee prosthesis. Screw fixation proximal anterior tibia. No acute fracture, dislocation or subluxation. Posterior and plantar calcaneal spurs. No osteolytic or osteoblastic lesions. Diffuse soft tissue swelling of the calf. IMPRESSION: Postsurgical changes. Soft tissue swelling. No acute osseous  abnormalities. Electronically Signed   By: Layla Maw M.D.   On: 05/13/2023 10:32     ASSESSMENT & PLAN:   Assessment & Plan by Problem: Principal Problem:   Cellulitis of left foot  Hevyn Sinquefield is a 72 y.o. female with pertinent PMH of 2 diabetes on insulin who presented with left leg pain and is admitted for cellulitis.  Cellulitis of left lower extremity Patient presenting with 3 days of acute pain, erythema, swelling of the left leg secondary to an unintentional dog scratch about 1 week ago.  Ascribes to severe pain which she describes as throbbing, she also has a white count of 13.6, and occasional chills. She is however afebrile, hemodynamically stable, well perfused, no lactic acidosis, so not concerned for a systemic infection.  Imaging is unrevealing, importantly ruling out DVT. Blood cultures from the ED are pending.  Will pursue treatment of cellulitis with IV antibiotics and pain control. - Vancomycin, cefepime IV - For pain Tylenol 1000 every 8 hours, oxycodone 5 mg every 6 hours as needed. Senna-docusate 1 tablet oral at bedtime prn as bowel regimen. -Trend white count and follow-up blood cultures  T2DM Most recent A1c is 7.4%. Takes Humulin R 11-20 units TID before meals - SSI  Afib Rate/Rhythm controlled without medicine. She takes warfarin outpatient. Will continue here per pharmacy. CBG monitoring.  Chronic CHF without exacerbation - Hold home lasix 60 BID, sprionolacton 12.5mg  every day, losartan 50mg  every day in setting of soft Bps. Check daily weights.  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  Will continue topiramate 200mg  po BID, Phenytoin 400mg  po qd. HLD - Continue home rosuvastatin 40mg  every day and zetia 10mg  every day  GERD - Continue home Protonix 40mg  qd Anxiety - Continue home Buspar 20mg  TID. Atarax 50mg   Night Terrors - Home prazosin 1mg  HTN - Holidng home    Diet: Heart Healthy VTE:  Warfarin IVF:  None Code: Full  Dispo: Admit patient to Inpatient with  expected length of stay greater than 2 midnights.  Signed: Katheran James, DO Internal Medicine Resident PGY-1  05/13/2023, 5:30 PM   Dr. Katheran James, DO Pager [phone]

## 2023-05-13 NOTE — ED Notes (Signed)
ED TO INPATIENT HANDOFF REPORT  ED Nurse Name and Phone #: [phone] Lendon Ka Name/Age/Gender Donna Snow 72 y.o. female Room/Bed: 016C/016C  Code Status   Code Status: Full Code  Home/SNF/Other Home Patient oriented to: self, place, time, and situation Is this baseline? Yes   Triage Complete: Triage complete  Chief Complaint Cellulitis of left foot [L03.116]  Triage Note Pt BIB [facility] from home d/t L lower leg swelling. It appears red, swollen and warm to touch. Pt was scratched by his leg 2 weeks ago. It progressively gotten worse. Also c/o possible UTI.  BP 108/56 HR 104  CBG 451  SPO2 94%     Allergies Allergies  Allergen Reactions   Latex Other (See Comments)    Welts - cant wear them, but can be touched by someone who wears them.   Penicillins Hives and Itching    Tolerated ceftriaxone 10/2020 " facial swelling"    Level of Care/Admitting Diagnosis ED Disposition     ED Disposition  Admit   Condition  --   Comment  Hospital Area: [facility] [[id number]]  Level of Care: Med-Surg [16]  May place patient in observation at [facility] or [facility] if equivalent level of care is available:: No  Covid Evaluation: Asymptomatic - no recent exposure (last 10 days) testing not required  Diagnosis: Cellulitis of left foot [440102]  Admitting Physician: Miguel Aschoff [[id number]]  Attending Physician: Miguel Aschoff [[id number]]          B Medical/Surgery History Past Medical History:  Diagnosis Date   Anxiety    CHF (congestive heart failure) (HCC)    Diabetes mellitus    Hypercholesteremia    Hypertension    Obesity    Seizures (HCC)    Stroke [facility])    Past Surgical History:  Procedure Laterality Date   ABDOMINAL HYSTERECTOMY     ABDOMINAL SURGERY     CHOLECYSTECTOMY     JOINT REPLACEMENT       A IV Location/Drains/Wounds Patient Lines/Drains/Airways Status     Active Line/Drains/Airways     Name Placement date  Placement time Site Days   Peripheral IV 05/13/23 20 G Posterior;Left Hand 05/13/23  1003  Hand  less than 1   Wound / Incision (Open or Dehisced) 02/14/22 Abdomen Right;Upper Abscess 02/14/22  2154  Abdomen  453            Intake/Output Last 24 hours No intake or output data in the 24 hours ending 05/13/23 1439  Labs/Imaging Results for orders placed or performed during the hospital encounter of 05/13/23 (from the past 48 hours)  Comprehensive metabolic panel     Status: Abnormal   Collection Time: 05/13/23  9:00 AM  Result Value Ref Range   Sodium 131 (L) 135 - 145 mmol/L   Potassium 4.5 3.5 - 5.1 mmol/L   Chloride 99 98 - 111 mmol/L   CO2 21 (L) 22 - 32 mmol/L   Glucose, Bld 344 (H) 70 - 99 mg/dL    Comment: Glucose reference range applies only to samples taken after fasting for at least 8 hours.   BUN 26 (H) 8 - 23 mg/dL   Creatinine, Ser 7.25 (H) 0.44 - 1.00 mg/dL   Calcium 8.3 (L) 8.9 - 10.3 mg/dL   Total Protein 7.0 6.5 - 8.1 g/dL   Albumin 2.6 (L) 3.5 - 5.0 g/dL   AST 20 15 - 41 U/L   ALT 23 0 -  44 U/L   Alkaline Phosphatase 127 (H) 38 - 126 U/L   Total Bilirubin 0.6 <1.2 mg/dL   GFR, Estimated 47 (L) >60 mL/min    Comment: (NOTE) Calculated using the CKD-EPI Creatinine Equation (2021)    Anion gap 11 5 - 15    Comment: Performed at [facility], [address] [place], [place] [address]  CBC with Differential     Status: Abnormal   Collection Time: 05/13/23  9:00 AM  Result Value Ref Range   WBC 13.6 (H) 4.0 - 10.5 K/uL   RBC 4.43 3.87 - 5.11 MIL/uL   Hemoglobin 12.6 12.0 - 15.0 g/dL   HCT 46.9 62.9 - 52.8 %   MCV 86.5 80.0 - 100.0 fL   MCH 28.4 26.0 - 34.0 pg   MCHC 32.9 30.0 - 36.0 g/dL   RDW 41.3 24.4 - 01.0 %   Platelets 240 150 - 400 K/uL   nRBC 0.0 0.0 - 0.2 %   Neutrophils Relative % 86 %   Neutro Abs 11.7 (H) 1.7 - 7.7 K/uL   Lymphocytes Relative 8 %   Lymphs Abs 1.1 0.7 - 4.0 K/uL   Monocytes Relative 5 %   Monocytes Absolute 0.7 0.1 -  1.0 K/uL   Eosinophils Relative 0 %   Eosinophils Absolute 0.0 0.0 - 0.5 K/uL   Basophils Relative 0 %   Basophils Absolute 0.0 0.0 - 0.1 K/uL   Immature Granulocytes 1 %   Abs Immature Granulocytes 0.08 (H) 0.00 - 0.07 K/uL    Comment: Performed at [facility], [address], [place], [place] [address]  Protime-INR     Status: Abnormal   Collection Time: 05/13/23  9:00 AM  Result Value Ref Range   Prothrombin Time 30.5 (H) 11.4 - 15.2 seconds   INR 2.9 (H) 0.8 - 1.2    Comment: (NOTE) INR goal varies based on device and disease states. Performed at [facility], [address], [place], [place] [address]   APTT     Status: Abnormal   Collection Time: 05/13/23  9:00 AM  Result Value Ref Range   aPTT 60 (H) 24 - 36 seconds    Comment:        IF BASELINE aPTT IS ELEVATED, SUGGEST PATIENT RISK ASSESSMENT BE USED TO DETERMINE APPROPRIATE ANTICOAGULANT THERAPY. Performed at [facility], [address] [place], [place] [address]   I-Stat Lactic Acid, ED     Status: None   Collection Time: 05/13/23 10:10 AM  Result Value Ref Range   Lactic Acid, Venous 1.9 0.5 - 1.9 mmol/L   DG Tibia/Fibula Left Result Date: 05/13/2023 CLINICAL DATA:  Cellulitis EXAM: LEFT TIBIA AND FIBULA - 2 VIEW COMPARISON:  None Available. FINDINGS: Knee prosthesis. Screw fixation proximal anterior tibia. No acute fracture, dislocation or subluxation. Posterior and plantar calcaneal spurs. No osteolytic or osteoblastic lesions. Diffuse soft tissue swelling of the calf. IMPRESSION: Postsurgical changes. Soft tissue swelling. No acute osseous abnormalities. Electronically Signed   By: Layla Maw M.D.   On: 05/13/2023 10:32    Pending Labs Unresulted Labs (From admission, onward)     Start     Ordered   05/14/23 0500  Basic metabolic panel  Tomorrow morning,   R        05/13/23 1401   05/14/23 0500  CBC  Tomorrow morning,   R        05/13/23 1401   05/13/23 5956  Blood Culture  (routine x 2)  (Undifferentiated presentation (screening labs and basic nursing orders))  BLOOD CULTURE X 2,   STAT      05/13/23 0928            Vitals/Pain Today's Vitals   05/13/23 0935 05/13/23 1030 05/13/23 1100 05/13/23 1430  BP:      Pulse:  99 96   Resp:  18 14   Temp: 98.1 F (36.7 C)   98.2 F (36.8 C)  TempSrc: Oral   Oral  SpO2:  93% 95%     Isolation Precautions No active isolations  Medications Medications  vancomycin (VANCOCIN) IVPB 1000 mg/200 mL premix (1,000 mg Intravenous New Bag/Given 05/13/23 1352)  senna-docusate (Senokot-S) tablet 1 tablet (has no administration in time range)  acetaminophen (TYLENOL) tablet 1,000 mg (1,000 mg Oral Patient Refused/Not Given 05/13/23 1408)  oxyCODONE (Oxy IR/ROXICODONE) immediate release tablet 5 mg (has no administration in time range)  HYDROmorphone (DILAUDID) injection 0.5 mg (0.5 mg Intravenous Given 05/13/23 1003)  ondansetron (ZOFRAN) injection 4 mg (4 mg Intravenous Given 05/13/23 1004)  ceFEPIme (MAXIPIME) 2 g in sodium chloride 0.9 % 100 mL IVPB (0 g Intravenous Stopped 05/13/23 1121)  metroNIDAZOLE (FLAGYL) IVPB 500 mg (0 mg Intravenous Stopped 05/13/23 1347)  sodium chloride 0.9 % bolus 1,000 mL (0 mLs Intravenous Stopped 05/13/23 1347)  morphine (PF) 4 MG/ML injection 4 mg (4 mg Intravenous Given 05/13/23 1348)    Mobility walks with person assist     Focused Assessments    R Recommendations: See Admitting Provider Note  Report given to:   Additional Notes: prefers to use bedside commode

## 2023-05-13 NOTE — Care Management Obs Status (Signed)
MEDICARE OBSERVATION STATUS NOTIFICATION   Patient Details  Name: Donna Snow MRN: [id number] Date of Birth: Jan 15, 1951   Medicare Observation Status Notification Given:  Yes    Ronny Bacon, RN 05/13/2023, 4:17 PM

## 2023-05-13 NOTE — ED Provider Notes (Cosign Needed Addendum)
[place] EMERGENCY DEPARTMENT AT [facility] Provider Note   CSN: [id number] Arrival date & time: 05/13/23  6433     History  Chief Complaint  Patient presents with   Leg Swelling    Donna Snow is a 72 y.o. female.  Patient with history of CHF on Lasix, diabetes on insulin, chronic back pain, on Coumadin for "heart flutter", on Dilantin for seizures but has not had a seizure in a very long time --presents to the emergency department for evaluation of left lower extremity pain, redness and swelling.  Symptoms have been progressing over the past several days.  Patient has had several superficial scratches on the leg from dogs.  No deep wounds.  Redness and pain have escalated over the past 24 hours.  Patient reports "low-grade" fever, but no documented temperatures over 100 F.  She has been generally nauseous over the past 2 days with some dry heaving but no forceful vomiting.  She denies urinary symptoms.  No chest pain or shortness of breath.  No abdominal pain.  The swelling and pain in the leg are causing difficulties with ambulation, prompting transport to the hospital today.       Home Medications Prior to Admission medications   Medication Sig Start Date End Date Taking? Authorizing Provider  albuterol (PROVENTIL) (2.5 MG/3ML) 0.083% nebulizer solution Inhale 3 mLs (2.5 mg total) into the lungs every 4 (four) hours as needed for shortness of breath. 05/12/22   Danford, Earl Lites, MD  albuterol (VENTOLIN HFA) 108 (90 Base) MCG/ACT inhaler Inhale 2 puffs into the lungs every 4 (four) hours as needed for wheezing or shortness of breath. 09/20/22   Eber Hong, MD  benzonatate (TESSALON) 100 MG capsule Take 1 capsule (100 mg total) by mouth every 8 (eight) hours. 09/20/22   Eber Hong, MD  bisacodyl (FLEET) 10 MG/30ML ENEM Place 10 mg rectally daily.    [provider]  busPIRone (BUSPAR) 10 MG tablet Take 20 mg by mouth 3 (three) times daily. 08/16/20    [provider]  doxycycline (VIBRAMYCIN) 100 MG capsule Take 1 capsule (100 mg total) by mouth 2 (two) times daily. 03/07/23   Cathren Laine, MD  ezetimibe (ZETIA) 10 MG tablet Take 1 tablet by mouth daily. 07/25/18   [provider]  fluconazole (DIFLUCAN) 200 MG tablet Take as needed if yeast infection 03/07/23   Cathren Laine, MD  fluticasone furoate-vilanterol (BREO ELLIPTA) 100-25 MCG/INH AEPB Inhale 1 puff into the lungs daily as needed (for shortness of breath). 03/08/20   [provider]  furosemide (LASIX) 20 MG tablet Take 3 tablets (60 mg total) by mouth daily. 08/15/22   Allayne Butcher, PA-C  hydrOXYzine (ATARAX) 50 MG tablet Take 50 mg by mouth 2 (two) times daily.    [provider]  ibuprofen (ADVIL) 800 MG tablet Take 800 mg by mouth every 8 (eight) hours as needed for mild pain or moderate pain.    [provider]  insulin regular human CONCENTRATED (HUMULIN R) 500 UNIT/ML injection Inject 12-20 Units into the skin 3 (three) times daily before meals. Sliding scale 3 times daily    [provider]  loratadine (CLARITIN) 10 MG tablet Take 10 mg by mouth daily.    [provider]  losartan (COZAAR) 50 MG tablet Take 50 mg by mouth daily.    [provider]  nystatin (MYCOSTATIN) 100000 UNIT/ML suspension Take 5 mLs (500,000 Units total) by mouth 4 (four) times daily. 09/20/22  Eber Hong, MD  omeprazole (PRILOSEC) 40 MG capsule Take 40 mg by mouth at bedtime. 06/05/20   [provider]  OZEMPIC, 0.25 OR 0.5 MG/DOSE, 2 MG/3ML SOPN Inject 0.5 mg into the skin once a week. Wednesday 12/05/21   [provider]  phenytoin (DILANTIN) 100 MG ER capsule Take 400 mg by mouth daily.    [provider]  polyethylene glycol powder (GLYCOLAX/MIRALAX) 17 GM/SCOOP powder Take 1 Container by mouth once. Takes 1 scoop per day    [provider]  prazosin (MINIPRESS) 1 MG capsule Take 1 mg by  mouth in the morning. Take 1mg  (1 tab) in AM. Take 2mg  (2 tab) at bedtime (per patient statement).    [provider]  rosuvastatin (CRESTOR) 40 MG tablet Take 1 tablet by mouth daily. 12/22/18   [provider]  spironolactone (ALDACTONE) 25 MG tablet Take 0.5 tablets (12.5 mg total) by mouth daily. 08/15/22   Robbie Lis M, PA-C  tiZANidine (ZANAFLEX) 4 MG capsule Take 4 mg by mouth 3 (three) times daily.    [provider]  topiramate (TOPAMAX) 100 MG tablet Take 200 mg by mouth 2 (two) times daily. 06/06/20   [provider]  trazodone (DESYREL) 300 MG tablet Take 300 mg by mouth at bedtime as needed for sleep.    [provider]  warfarin (COUMADIN) 7.5 MG tablet Take 1 tablet (7.5 mg total) by mouth daily at 4 PM. Patient taking differently: Take 3.75-7.5 mg by mouth See admin instructions. 3.75 mg ,Saturday and Sunday  7.5 mg Monday, Tuesday Wednesday, Thursday and Friday. 10/22/20 02/14/23  Johnson, Clanford L, MD  atorvastatin (LIPITOR) 40 MG tablet Take 40 mg by mouth daily.  10/21/20  [provider]  citalopram (CELEXA) 20 MG tablet Take by mouth. Patient not taking: Reported on 10/15/2020  10/21/20  [provider]  dicyclomine (BENTYL) 10 MG capsule Take by mouth. Patient not taking: Reported on 10/15/2020  10/21/20  [provider]  fexofenadine (ALLEGRA) 180 MG tablet Take 180 mg by mouth daily.    11/27/19  [provider]  glipiZIDE (GLUCOTROL) 10 MG tablet Take 10 mg by mouth 2 (two) times daily before a meal.    11/27/19  [provider]  lisinopril-hydrochlorothiazide (PRINZIDE,ZESTORETIC) 10-12.5 MG per tablet Take 1 tablet by mouth daily.   Patient not taking: No sig reported  10/21/20  [provider]  sitaGLIPtan-metformin (JANUMET) 50-1000 MG per tablet Take 1 tablet by mouth 2 (two) times daily with a meal.    11/27/19  [provider]  zolpidem (AMBIEN) 10 MG tablet Take 10 mg  by mouth at bedtime as needed.    11/27/19  [provider]      Allergies    Latex and Penicillins    Review of Systems   Review of Systems  Physical Exam Updated Vital Signs BP (!) 144/63   Pulse 99   Temp 98.1 F (36.7 C) (Oral)   Resp 20   SpO2 95%  Physical Exam Vitals and nursing note reviewed.  Constitutional:      General: She is not in acute distress.    Appearance: She is well-developed.  HENT:     Head: Normocephalic and atraumatic.     Right Ear: External ear normal.     Left Ear: External ear normal.     Nose: Nose normal.  Eyes:     Conjunctiva/sclera: Conjunctivae normal.  Cardiovascular:     Rate and Rhythm:  Normal rate and regular rhythm.     Heart sounds: No murmur heard. Pulmonary:     Effort: No respiratory distress.     Breath sounds: No wheezing, rhonchi or rales.  Abdominal:     Palpations: Abdomen is soft.     Tenderness: There is no abdominal tenderness. There is no guarding or rebound.  Musculoskeletal:     Cervical back: Normal range of motion and neck supple.     Right lower leg: No edema.     Left lower leg: Edema present.     Comments: Patient with left lower extremity edema with several superficial healing abrasions.  Erythema and warmth is most pronounced on the dorsum of the foot and ankle area, however it does track more proximally, proximal to the knee especially medially.  Patient tender to palpation.  No palpable abscess or areas of drainage noted.  Skin:    General: Skin is warm and dry.     Findings: No rash.  Neurological:     General: No focal deficit present.     Mental Status: She is alert. Mental status is at baseline.     Motor: No weakness.  Psychiatric:        Mood and Affect: Mood normal.          ED Results / Procedures / Treatments   Labs (all labs ordered are listed, but only abnormal results are displayed) Labs Reviewed  COMPREHENSIVE METABOLIC PANEL - Abnormal; Notable for the following  components:      Result Value   Sodium 131 (*)    CO2 21 (*)    Glucose, Bld 344 (*)    BUN 26 (*)    Creatinine, Ser 1.23 (*)    Calcium 8.3 (*)    Albumin 2.6 (*)    Alkaline Phosphatase 127 (*)    GFR, Estimated 47 (*)    All other components within normal limits  CBC WITH DIFFERENTIAL/PLATELET - Abnormal; Notable for the following components:   WBC 13.6 (*)    Neutro Abs 11.7 (*)    Abs Immature Granulocytes 0.08 (*)    All other components within normal limits  PROTIME-INR - Abnormal; Notable for the following components:   Prothrombin Time 30.5 (*)    INR 2.9 (*)    All other components within normal limits  APTT - Abnormal; Notable for the following components:   aPTT 60 (*)    All other components within normal limits  CULTURE, BLOOD (ROUTINE X 2)  CULTURE, BLOOD (ROUTINE X 2)  I-STAT CG4 LACTIC ACID, ED    EKG EKG Interpretation Date/Time:  Sunday May 13 2023 09:20:02 EST Ventricular Rate:  99 PR Interval:  187 QRS Duration:  86 QT Interval:  345 QTC Calculation: 443 R Axis:   8  Text Interpretation: Sinus rhythm Low voltage, precordial leads Confirmed by Alona Bene [phone]) on 05/13/2023 9:21:59 AM  Radiology DG Tibia/Fibula Left Result Date: 05/13/2023 CLINICAL DATA:  Cellulitis EXAM: LEFT TIBIA AND FIBULA - 2 VIEW COMPARISON:  None Available. FINDINGS: Knee prosthesis. Screw fixation proximal anterior tibia. No acute fracture, dislocation or subluxation. Posterior and plantar calcaneal spurs. No osteolytic or osteoblastic lesions. Diffuse soft tissue swelling of the calf. IMPRESSION: Postsurgical changes. Soft tissue swelling. No acute osseous abnormalities. Electronically Signed   By: Layla Maw M.D.   On: 05/13/2023 10:32    Procedures Procedures    Medications Ordered in ED Medications  metroNIDAZOLE (FLAGYL) IVPB 500 mg (has no administration in  time range)  vancomycin (VANCOCIN) IVPB 1000 mg/200 mL premix (has no administration in  time range)  HYDROmorphone (DILAUDID) injection 0.5 mg (0.5 mg Intravenous Given 05/13/23 1003)  ondansetron (ZOFRAN) injection 4 mg (4 mg Intravenous Given 05/13/23 1004)  ceFEPIme (MAXIPIME) 2 g in sodium chloride 0.9 % 100 mL IVPB (2 g Intravenous New Bag/Given 05/13/23 1049)  sodium chloride 0.9 % bolus 1,000 mL (1,000 mLs Intravenous New Bag/Given 05/13/23 1050)    ED Course/ Medical Decision Making/ A&P    Patient seen and examined. History obtained directly from patient.   Labs/EKG: Ordered CBC, CMP, PT/INR and APTT ordered as part of sepsis protocol and patient's use of Coumadin, lactic acid, blood cultures  Imaging: Ordered x-ray of the left tib-fib, ultrasound of the left lower extremity.  Medications/Fluids: Ordered: IV Dilaudid, IV Zofran  Most recent vital signs reviewed and are as follows: BP (!) 144/63   Pulse 99   Temp 98.1 F (36.7 C) (Oral)   Resp 20   SpO2 95%   Initial impression: Left lower extremity swelling and infection in setting of diabetes.  Patient high risk.  She will need admission to hospital.  10:32 AM Reassessment performed. Patient appears stable. Using diabetic woud infection order set, patient is high risk due to infection plus greater than 2 SIRS (elevated white blood cell count, elevated heart rate greater than 90), and for that reason initial antibiotics selected were cefepime, vancomycin, and metronidazole.  Labs personally reviewed and interpreted including: CBC shows elevated white blood cell count at 13.6 otherwise normal hemoglobin; lactate high normal at 1.9.  Plan: Initiate IV antibiotic therapy, awaiting remainder of workup.  11:15 AM Reassessment performed. Patient appears stable, comfortable.  Labs personally reviewed and interpreted including: CMP with sodium 131, corrects to near normal, bicarb 21 but anion gap is normal at 11, creatinine 1.23 and BUN 26 above baseline likely mild AKI; INR therapeutic at 2.9, APTT normal at  60.  Imaging personally visualized and interpreted including: Tib-fib, agree no bony abnormalities.  Reviewed pertinent lab work and imaging with patient at bedside. Questions answered.   Most current vital signs reviewed and are as follows: BP (!) 144/63   Pulse 99   Temp 98.1 F (36.7 C) (Oral)   Resp 18   SpO2 93%   Plan: Admit to hospital.   Patient reports that PCP is currently Loura Back NP with [facility] on [address].  12:03 PM I have spoken with IMTS team who will see for admission.                                 Medical Decision Making Amount and/or Complexity of Data Reviewed Labs: ordered. Radiology: ordered.  Risk Prescription drug management. Decision regarding hospitalization.   Patient with diabetes with left lower extremity cellulitis.  Elevated white blood cell count.  No signs of severe sepsis at this time.  No signs of DKA.  She will need admission for IV antibiotics.        Final Clinical Impression(s) / ED Diagnoses Final diagnoses:  Cellulitis of left lower extremity  Hyperglycemia    Rx / DC Orders ED Discharge Orders     None         Renne Crigler, PA-C 05/13/23 1121    Renne Crigler, PA-C 05/13/23 1203

## 2023-05-13 NOTE — Hospital Course (Addendum)
-DM  -no hx of PVD -LLE cellulitis -subjective fevers -diff ambulate   -labs mild AKI, leukocytosis -no DKA -on Coumadin for a flutter   -pending DVT u/s  -got cefepime, vancomycin    -dog scratch  History of Present Illness: *** -had dog scratch about 1 week ago, family dog, vaccinated  -tried abx cream for 2 days and stopped *** -increased redness and swelling for past 2-3 days -throbbing pain due to all the swelling  -endorses chills but no fever  -no prior history  -no wounds previously  Surrogate decision maker: [name]    Meds:  -albuterol inhaler PRN -buspar 20 mg BID (decreased by PCP) -Zetia 10 mg daily -Lasix 60 mg daily -Hydroxyzine 50 mg BID -Humulin R 11-20 units TID before meals -losartan 50 mg daily -omeprazole 40 mg daily -metformin *** -Ozempic 0.5 mg weekly -phenytoin 400 mg daily -prazosin 1 mg AM, 2mg  at bedtime  -rosuvastain 40 mg daily -spironolactone 12.5 mg daily -tizanidine 4 mg TID PRN (not often or recent) -topiramate 200 mg BID  -trazodone 300 mg at bedtime PRN -warfarin 3 mg daily  -Breo (no longer due to cost)  Current Meds  Medication Sig   albuterol (PROVENTIL) (2.5 MG/3ML) 0.083% nebulizer solution Inhale 3 mLs (2.5 mg total) into the lungs every 4 (four) hours as needed for shortness of breath.   albuterol (VENTOLIN HFA) 108 (90 Base) MCG/ACT inhaler Inhale 2 puffs into the lungs every 4 (four) hours as needed for wheezing or shortness of breath.   busPIRone (BUSPAR) 10 MG tablet Take 20 mg by mouth 3 (three) times daily.   ezetimibe (ZETIA) 10 MG tablet Take 10 mg by mouth daily.   furosemide (LASIX) 20 MG tablet Take 3 tablets (60 mg total) by mouth daily.   hydrOXYzine (ATARAX) 50 MG tablet Take 50 mg by mouth 2 (two) times daily.   ibuprofen (ADVIL) 800 MG tablet Take 800 mg by mouth every 8 (eight) hours as needed for mild pain or moderate pain.   insulin regular human CONCENTRATED (HUMULIN R) 500 UNIT/ML injection  Inject 12-20 Units into the skin 3 (three) times daily before meals. Sliding scale 3 times daily   loratadine (CLARITIN) 10 MG tablet Take 10 mg by mouth daily.   losartan (COZAAR) 50 MG tablet Take 50 mg by mouth daily.   omeprazole (PRILOSEC) 40 MG capsule Take 40 mg by mouth daily.   OZEMPIC, 0.25 OR 0.5 MG/DOSE, 2 MG/3ML SOPN Inject 0.5 mg into the skin once a week. Wednesday   phenytoin (DILANTIN) 100 MG ER capsule Take 400 mg by mouth daily.   polyethylene glycol powder (GLYCOLAX/MIRALAX) 17 GM/SCOOP powder Take 1 Container by mouth once. Takes 1 scoop per day   prazosin (MINIPRESS) 1 MG capsule Take 1 mg by mouth in the morning. Take 1mg  (1 tab) in AM. Take 2mg  (2 tab) at bedtime (per patient statement).   rosuvastatin (CRESTOR) 40 MG tablet Take 1 tablet by mouth daily.   spironolactone (ALDACTONE) 25 MG tablet Take 0.5 tablets (12.5 mg total) by mouth daily.   tiZANidine (ZANAFLEX) 4 MG capsule Take 4 mg by mouth 3 (three) times daily.   topiramate (TOPAMAX) 100 MG tablet Take 200 mg by mouth 2 (two) times daily.   trazodone (DESYREL) 300 MG tablet Take 300 mg by mouth at bedtime as needed for sleep.   warfarin (COUMADIN) 3 MG tablet Take 3 mg by mouth daily.    Past Medical History -chronic back pain  -  atrial flutter -CHF -DM -HLD -HTN -Seizures  -CVA -depression -COPD   Past Medical History:  Diagnosis Date   Anxiety    CHF (congestive heart failure) (HCC)    Diabetes mellitus    Hypercholesteremia    Hypertension    Obesity    Seizures (HCC)    Stroke [facility])    Past Surgical History:  Procedure Laterality Date   ABDOMINAL HYSTERECTOMY     ABDOMINAL SURGERY     CHOLECYSTECTOMY     JOINT REPLACEMENT     Social:  Lives With: daughter  Support: family  Level of Function: needs assistance by daughter at home due to chronic back pain but able to complete ADLs  PCP: Dr. Selena [facility]: [facility] on [address]) Substances: denies tobacco, alcohol or recreational drug  use  Family History: adopted   Allergies: Allergies as of 05/13/2023 - Review Complete 05/13/2023  Allergen Reaction Noted   Latex Other (See Comments) 10/27/2013   Penicillins Hives and Itching 02/15/2011

## 2023-05-13 NOTE — Progress Notes (Signed)
PHARMACY - ANTICOAGULATION CONSULT NOTE  Pharmacy Consult for warfarin Indication: atrial fibrillation  Allergies  Allergen Reactions   Latex Other (See Comments)    Welts - cant wear them, but can be touched by someone who wears them.   Penicillins Hives and Itching    Tolerated ceftriaxone 10/2020 " facial swelling"   Vital Signs: Temp: 98.7 F (37.1 C) (12/29 1539) Temp Source: Oral (12/29 1539) BP: 110/43 (12/29 1539) Pulse Rate: 93 (12/29 1539)  Labs: Recent Labs    05/13/23 0900  HGB 12.6  HCT 38.3  PLT 240  APTT 60*  LABPROT 30.5*  INR 2.9*  CREATININE 1.23*    CrCl cannot be calculated (Unknown ideal weight.).   Medical History: Past Medical History:  Diagnosis Date   Anxiety    CHF (congestive heart failure) (HCC)    Diabetes mellitus    Hypercholesteremia    Hypertension    Obesity    Seizures (HCC)    Stroke (HCC)     Medications:  Medications Prior to Admission  Medication Sig Dispense Refill Last Dose/Taking   albuterol (PROVENTIL) (2.5 MG/3ML) 0.083% nebulizer solution Inhale 3 mLs (2.5 mg total) into the lungs every 4 (four) hours as needed for shortness of breath. 75 mL 12 Past Month   albuterol (VENTOLIN HFA) 108 (90 Base) MCG/ACT inhaler Inhale 2 puffs into the lungs every 4 (four) hours as needed for wheezing or shortness of breath. 1 each 3 Past Week   bisacodyl (FLEET) 10 MG/30ML ENEM Place 10 mg rectally daily.   Past Week   busPIRone (BUSPAR) 10 MG tablet Take 20 mg by mouth 3 (three) times daily.   05/12/2023   donepezil (ARICEPT) 5 MG tablet Take 5 mg by mouth daily.   05/12/2023   ezetimibe (ZETIA) 10 MG tablet Take 10 mg by mouth daily.   05/13/2023   furosemide (LASIX) 20 MG tablet Take 3 tablets (60 mg total) by mouth daily. 270 tablet 3 05/13/2023   hydrOXYzine (ATARAX) 50 MG tablet Take 50 mg by mouth 2 (two) times daily.   05/13/2023   hyoscyamine (LEVSIN SL) 0.125 MG SL tablet Take 0.125 mg by mouth daily as needed for  cramping.   Past Week   ibuprofen (ADVIL) 800 MG tablet Take 800 mg by mouth every 8 (eight) hours as needed for mild pain or moderate pain.   Past Week   insulin regular human CONCENTRATED (HUMULIN R) 500 UNIT/ML injection Inject 12-20 Units into the skin 3 (three) times daily before meals. Sliding scale 3 times daily   05/13/2023   loratadine (CLARITIN) 10 MG tablet Take 10 mg by mouth daily.   05/13/2023   losartan (COZAAR) 50 MG tablet Take 50 mg by mouth daily.   05/13/2023   omeprazole (PRILOSEC) 40 MG capsule Take 40 mg by mouth daily.   05/13/2023   OZEMPIC, 0.25 OR 0.5 MG/DOSE, 2 MG/3ML SOPN Inject 0.5 mg into the skin once a week. Wednesday   Past Week   phenytoin (DILANTIN) 100 MG ER capsule Take 400 mg by mouth daily.   05/13/2023   polyethylene glycol powder (GLYCOLAX/MIRALAX) 17 GM/SCOOP powder Take 1 Container by mouth once. Takes 1 scoop per day   Past Week   prazosin (MINIPRESS) 1 MG capsule Take 1 mg by mouth in the morning. Take 1mg  (1 tab) in AM. Take 2mg  (2 tab) at bedtime (per patient statement).   05/13/2023   rosuvastatin (CRESTOR) 40 MG tablet Take 1 tablet by mouth daily.  05/13/2023   spironolactone (ALDACTONE) 25 MG tablet Take 0.5 tablets (12.5 mg total) by mouth daily. 45 tablet 11 05/13/2023   tiZANidine (ZANAFLEX) 4 MG capsule Take 4 mg by mouth 3 (three) times daily.   05/13/2023   topiramate (TOPAMAX) 100 MG tablet Take 200 mg by mouth 2 (two) times daily.   05/13/2023   trazodone (DESYREL) 300 MG tablet Take 300 mg by mouth at bedtime as needed for sleep.   05/12/2023   warfarin (COUMADIN) 3 MG tablet Take 3 mg by mouth daily.   05/13/2023   fluticasone furoate-vilanterol (BREO ELLIPTA) 100-25 MCG/INH AEPB Inhale 1 puff into the lungs daily as needed (for shortness of breath). (Patient not taking: Reported on 05/13/2023)   Not Taking    Assessment: Donna Snow is a 72yoF who presented from home due to cellulitis and possible UTI. Patient is on chronic warfarin  for Afib and also Dilantin for history of seizures. Pharmacy consulted to manage warfarin while inpatient. Patient took warfarin dose 12/29. PTA list patient reports warfarin 3 mg daily. INR is therapeutic today at 2.9. Hgb and platelets within normal limits.  Of note: Prescription from 05/08/2023 has a 5 mg daily dose. November RX has tablet reduced from 7.5 mg to 6 mg, and to discontinue the 7.5 mg tablet, then December has reduction to 5 mg, and to discontinue the 6 mg tablet.   Goal of Therapy:  INR 2-3 Monitor platelets by anticoagulation protocol: Yes   Plan:  Patient reports taking warfarin today. Monitor daily INR, CBC, clinical course, s/sx of bleed, PO intake/diet, Drug-Drug Interactions   Thank you for allowing pharmacy to be a part of this patients care.   Signe Colt, PharmD 05/13/2023 5:38 PM  **Pharmacist phone directory can be found on [email or web address] listed under [facility]**

## 2023-05-13 NOTE — Progress Notes (Signed)
Pharmacy Antibiotic Note  Donna Snow is a 72 y.o. female admitted on 05/13/2023 with cellulitis.  Pharmacy has been consulted for cefepime, vancomycin dosing.  CrCl 47ml/min - using weight from 2023 - 137kg  Plan: Cefepime 2g IV q12h, vancomycin 750q12h (eAUC 495) Vancomycin 1g given in ED,  -vanc levels PRN per protocol,  -goal trough 15-20, Auc 400-600 F/u cultures, LOT, renal func, updated weight     Temp (24hrs), Avg:98.2 F (36.8 C), Min:98.1 F (36.7 C), Max:98.2 F (36.8 C)  Recent Labs  Lab 05/13/23 0900 05/13/23 1010  WBC 13.6*  --   CREATININE 1.23*  --   LATICACIDVEN  --  1.9    CrCl cannot be calculated (Unknown ideal weight.).    Allergies  Allergen Reactions   Latex Other (See Comments)    Welts - cant wear them, but can be touched by someone who wears them.   Penicillins Hives and Itching    Tolerated ceftriaxone 10/2020 " facial swelling"    Antimicrobials this admission: Vanc 12/29> Cefepime 12/29>  Flagyl 12/29 x1  Dose adjustments this admission:   Microbiology results: 12/29 [facility]  Thank you for allowing pharmacy to be a part of this patients care.  Calton Dach, PharmD, BCCCP Clinical Pharmacist 05/13/2023 2:45 PM

## 2023-05-13 NOTE — ED Triage Notes (Signed)
Pt BIB [facility] from home d/t L lower leg swelling. It appears red, swollen and warm to touch. Pt was scratched by his leg 2 weeks ago. It progressively gotten worse. Also c/o possible UTI.  BP 108/56 HR 104  CBG 451  SPO2 94%

## 2023-05-13 NOTE — Plan of Care (Signed)
  Problem: Education: Goal: Knowledge of General Education information will improve Description Including pain rating scale, medication(s)/side effects and non-pharmacologic comfort measures Outcome: Progressing   

## 2023-05-14 DIAGNOSIS — L03116 Cellulitis of left lower limb: Secondary | ICD-10-CM

## 2023-05-14 DIAGNOSIS — M11262 Other chondrocalcinosis, left knee: Secondary | ICD-10-CM | POA: Diagnosis present

## 2023-05-14 DIAGNOSIS — Z7985 Long-term (current) use of injectable non-insulin antidiabetic drugs: Secondary | ICD-10-CM

## 2023-05-14 DIAGNOSIS — E119 Type 2 diabetes mellitus without complications: Secondary | ICD-10-CM

## 2023-05-14 DIAGNOSIS — I4891 Unspecified atrial fibrillation: Secondary | ICD-10-CM | POA: Diagnosis not present

## 2023-05-14 DIAGNOSIS — K5900 Constipation, unspecified: Secondary | ICD-10-CM

## 2023-05-14 DIAGNOSIS — G40909 Epilepsy, unspecified, not intractable, without status epilepticus: Secondary | ICD-10-CM | POA: Diagnosis present

## 2023-05-14 DIAGNOSIS — Z794 Long term (current) use of insulin: Secondary | ICD-10-CM | POA: Diagnosis not present

## 2023-05-14 DIAGNOSIS — Z23 Encounter for immunization: Secondary | ICD-10-CM | POA: Diagnosis present

## 2023-05-14 DIAGNOSIS — Z96653 Presence of artificial knee joint, bilateral: Secondary | ICD-10-CM | POA: Diagnosis present

## 2023-05-14 DIAGNOSIS — I509 Heart failure, unspecified: Secondary | ICD-10-CM

## 2023-05-14 DIAGNOSIS — W548XXA Other contact with dog, initial encounter: Secondary | ICD-10-CM | POA: Diagnosis not present

## 2023-05-14 DIAGNOSIS — R600 Localized edema: Secondary | ICD-10-CM | POA: Diagnosis not present

## 2023-05-14 DIAGNOSIS — K219 Gastro-esophageal reflux disease without esophagitis: Secondary | ICD-10-CM | POA: Diagnosis present

## 2023-05-14 DIAGNOSIS — B001 Herpesviral vesicular dermatitis: Secondary | ICD-10-CM | POA: Diagnosis not present

## 2023-05-14 DIAGNOSIS — R7881 Bacteremia: Secondary | ICD-10-CM | POA: Diagnosis not present

## 2023-05-14 DIAGNOSIS — R739 Hyperglycemia, unspecified: Secondary | ICD-10-CM | POA: Diagnosis present

## 2023-05-14 DIAGNOSIS — G8929 Other chronic pain: Secondary | ICD-10-CM | POA: Diagnosis present

## 2023-05-14 DIAGNOSIS — A419 Sepsis, unspecified organism: Secondary | ICD-10-CM | POA: Diagnosis present

## 2023-05-14 DIAGNOSIS — E1165 Type 2 diabetes mellitus with hyperglycemia: Secondary | ICD-10-CM | POA: Diagnosis present

## 2023-05-14 DIAGNOSIS — E7800 Pure hypercholesterolemia, unspecified: Secondary | ICD-10-CM | POA: Diagnosis present

## 2023-05-14 DIAGNOSIS — N179 Acute kidney failure, unspecified: Secondary | ICD-10-CM | POA: Diagnosis present

## 2023-05-14 DIAGNOSIS — I480 Paroxysmal atrial fibrillation: Secondary | ICD-10-CM | POA: Diagnosis present

## 2023-05-14 DIAGNOSIS — I5032 Chronic diastolic (congestive) heart failure: Secondary | ICD-10-CM | POA: Diagnosis present

## 2023-05-14 DIAGNOSIS — F514 Sleep terrors [night terrors]: Secondary | ICD-10-CM | POA: Diagnosis present

## 2023-05-14 DIAGNOSIS — F552 Abuse of laxatives: Secondary | ICD-10-CM | POA: Diagnosis present

## 2023-05-14 DIAGNOSIS — F411 Generalized anxiety disorder: Secondary | ICD-10-CM | POA: Diagnosis present

## 2023-05-14 DIAGNOSIS — E872 Acidosis, unspecified: Secondary | ICD-10-CM | POA: Diagnosis present

## 2023-05-14 DIAGNOSIS — M1120 Other chondrocalcinosis, unspecified site: Secondary | ICD-10-CM | POA: Diagnosis not present

## 2023-05-14 DIAGNOSIS — M25462 Effusion, left knee: Secondary | ICD-10-CM | POA: Diagnosis present

## 2023-05-14 DIAGNOSIS — G4733 Obstructive sleep apnea (adult) (pediatric): Secondary | ICD-10-CM | POA: Diagnosis present

## 2023-05-14 DIAGNOSIS — Z7901 Long term (current) use of anticoagulants: Secondary | ICD-10-CM | POA: Diagnosis not present

## 2023-05-14 DIAGNOSIS — G4700 Insomnia, unspecified: Secondary | ICD-10-CM | POA: Diagnosis present

## 2023-05-14 DIAGNOSIS — J4489 Other specified chronic obstructive pulmonary disease: Secondary | ICD-10-CM | POA: Diagnosis present

## 2023-05-14 DIAGNOSIS — Z6841 Body Mass Index (BMI) 40.0 and over, adult: Secondary | ICD-10-CM | POA: Diagnosis not present

## 2023-05-14 DIAGNOSIS — I110 Hypertensive heart disease with heart failure: Secondary | ICD-10-CM | POA: Diagnosis present

## 2023-05-14 LAB — CBC
HCT: 38.2 % (ref 36.0–46.0)
Hemoglobin: 12.6 g/dL (ref 12.0–15.0)
MCH: 28.3 pg (ref 26.0–34.0)
MCHC: 33 g/dL (ref 30.0–36.0)
MCV: 85.8 fL (ref 80.0–100.0)
Platelets: 253 10*3/uL (ref 150–400)
RBC: 4.45 MIL/uL (ref 3.87–5.11)
RDW: 14.1 % (ref 11.5–15.5)
WBC: 11.8 10*3/uL — ABNORMAL HIGH (ref 4.0–10.5)
nRBC: 0 % (ref 0.0–0.2)

## 2023-05-14 LAB — BASIC METABOLIC PANEL
Anion gap: 16 — ABNORMAL HIGH (ref 5–15)
Anion gap: 11 (ref 5–15)
BUN: 15 mg/dL (ref 8–23)
BUN: 12 mg/dL (ref 8–23)
CO2: 15 mmol/L — ABNORMAL LOW (ref 22–32)
CO2: 23 mmol/L (ref 22–32)
Calcium: 8.4 mg/dL — ABNORMAL LOW (ref 8.9–10.3)
Calcium: 8.7 mg/dL — ABNORMAL LOW (ref 8.9–10.3)
Chloride: 105 mmol/L (ref 98–111)
Chloride: 102 mmol/L (ref 98–111)
Creatinine, Ser: 0.93 mg/dL (ref 0.44–1.00)
Creatinine, Ser: 0.83 mg/dL (ref 0.44–1.00)
GFR, Estimated: >60 mL/min (ref >60)
GFR, Estimated: >60 mL/min (ref >60)
Glucose, Bld: 289 mg/dL — ABNORMAL HIGH (ref 70–99)
Glucose, Bld: 244 mg/dL — ABNORMAL HIGH (ref 70–99)
Potassium: 4 mmol/L (ref 3.5–5.1)
Potassium: 3.5 mmol/L (ref 3.5–5.1)
Sodium: 136 mmol/L (ref 135–145)
Sodium: 136 mmol/L (ref 135–145)

## 2023-05-14 LAB — PROTIME-INR
INR: 3 — ABNORMAL HIGH (ref 0.8–1.2)
Prothrombin Time: 31.1 s — ABNORMAL HIGH (ref 11.4–15.2)

## 2023-05-14 LAB — BETA-HYDROXYBUTYRIC ACID
Beta-Hydroxybutyric Acid: 0.78 mmol/L — ABNORMAL HIGH (ref 0.05–0.27)
Beta-Hydroxybutyric Acid: 0.31 mmol/L — ABNORMAL HIGH (ref 0.05–0.27)

## 2023-05-14 LAB — GLUCOSE, CAPILLARY
Glucose-Capillary: 253 mg/dL — ABNORMAL HIGH (ref 70–99)
Glucose-Capillary: 251 mg/dL — ABNORMAL HIGH (ref 70–99)
Glucose-Capillary: 208 mg/dL — ABNORMAL HIGH (ref 70–99)
Glucose-Capillary: 229 mg/dL — ABNORMAL HIGH (ref 70–99)

## 2023-05-14 MED ORDER — OXYCODONE HCL 5 MG PO TABS
5.0000 mg | ORAL_TABLET | ORAL | Status: DC | PRN
  Administered 2023-05-14 (×2): 5 mg via ORAL
  Filled 2023-05-14 (×2): qty 1

## 2023-05-14 MED ORDER — ACETAMINOPHEN 500 MG PO TABS
1000.0000 mg | ORAL_TABLET | Freq: Four times a day (QID) | ORAL | Status: DC
  Administered 2023-05-14 – 2023-05-29 (×50): 1000 mg via ORAL
  Filled 2023-05-14 (×50): qty 2

## 2023-05-14 MED ORDER — PRAZOSIN HCL 2 MG PO CAPS
2.0000 mg | ORAL_CAPSULE | Freq: Every day | ORAL | Status: DC
  Administered 2023-05-14 – 2023-05-28 (×15): 2 mg via ORAL
  Filled 2023-05-14 (×16): qty 1

## 2023-05-14 MED ORDER — INSULIN ASPART 100 UNIT/ML IJ SOLN
0.0000 [IU] | Freq: Every day | INTRAMUSCULAR | Status: DC
  Administered 2023-05-14 – 2023-05-15 (×2): 2 [IU] via SUBCUTANEOUS
  Administered 2023-05-22: 4 [IU] via SUBCUTANEOUS
  Administered 2023-05-23: 2 [IU] via SUBCUTANEOUS
  Administered 2023-05-24: 3 [IU] via SUBCUTANEOUS
  Administered 2023-05-26: 2 [IU] via SUBCUTANEOUS
  Administered 2023-05-28: 3 [IU] via SUBCUTANEOUS

## 2023-05-14 MED ORDER — PRAZOSIN HCL 1 MG PO CAPS
1.0000 mg | ORAL_CAPSULE | Freq: Every morning | ORAL | Status: DC
  Administered 2023-05-15 – 2023-05-29 (×14): 1 mg via ORAL
  Filled 2023-05-14 (×19): qty 1

## 2023-05-14 MED ORDER — SENNOSIDES-DOCUSATE SODIUM 8.6-50 MG PO TABS
1.0000 | ORAL_TABLET | Freq: Every day | ORAL | Status: DC
  Administered 2023-05-14 – 2023-05-15 (×2): 1 via ORAL
  Filled 2023-05-14 (×2): qty 1

## 2023-05-14 MED ORDER — POLYETHYLENE GLYCOL 3350 17 G PO PACK: 17.0000 g | PACK | Freq: Every day | ORAL | Status: DC | PRN

## 2023-05-14 MED ORDER — SODIUM CHLORIDE 0.9 % IV SOLN
750.0000 mg | Freq: Two times a day (BID) | INTRAVENOUS | Status: DC
  Administered 2023-05-15 – 2023-05-18 (×8): 750 mg via INTRAVENOUS
  Filled 2023-05-14 (×10): qty 15

## 2023-05-14 MED ORDER — OXYCODONE HCL 5 MG PO TABS
5.0000 mg | ORAL_TABLET | ORAL | Status: DC | PRN
  Administered 2023-05-14 – 2023-05-15 (×3): 10 mg via ORAL
  Filled 2023-05-14 (×3): qty 2

## 2023-05-14 MED ORDER — MINERAL OIL RE ENEM
1.0000 | ENEMA | Freq: Once | RECTAL | Status: AC | PRN
  Administered 2023-05-14: 1 via RECTAL
  Filled 2023-05-14: qty 1

## 2023-05-14 MED ORDER — OXYCODONE HCL 5 MG PO TABS: 10.0000 mg | ORAL_TABLET | ORAL | Status: DC | PRN

## 2023-05-14 MED ORDER — INSULIN GLARGINE-YFGN 100 UNIT/ML ~~LOC~~ SOLN
15.0000 [IU] | Freq: Every day | SUBCUTANEOUS | Status: DC
  Administered 2023-05-14: 15 [IU] via SUBCUTANEOUS
  Filled 2023-05-14 (×2): qty 0.15

## 2023-05-14 MED ORDER — WARFARIN SODIUM 2 MG PO TABS
2.0000 mg | ORAL_TABLET | Freq: Once | ORAL | Status: AC
  Administered 2023-05-14: 2 mg via ORAL
  Filled 2023-05-14: qty 1

## 2023-05-14 NOTE — Progress Notes (Signed)
PHARMACY - ANTICOAGULATION CONSULT NOTE  Pharmacy Consult for warfarin Indication: atrial fibrillation  Allergies  Allergen Reactions   Latex Other (See Comments)    Welts - cant wear them, but can be touched by someone who wears them.   Penicillins Hives and Itching    Tolerated ceftriaxone 10/2020 " facial swelling"   Vital Signs: Temp: 98.5 F (36.9 C) (12/30 0528) Temp Source: Oral (12/30 0528) BP: 132/57 (12/30 0528) Pulse Rate: 83 (12/30 0528)  Labs: Recent Labs    05/13/23 0900 05/14/23 0533  HGB 12.6 12.6  HCT 38.3 38.2  PLT 240 253  APTT 60*  --   LABPROT 30.5* 31.1*  INR 2.9* 3.0*  CREATININE 1.23* 0.93    CrCl cannot be calculated (Unknown ideal weight.).   Medical History: Past Medical History:  Diagnosis Date   Anxiety    CHF (congestive heart failure) (HCC)    Diabetes mellitus    Hypercholesteremia    Hypertension    Obesity    Seizures (HCC)    Stroke (HCC)     Medications:  Medications Prior to Admission  Medication Sig Dispense Refill Last Dose/Taking   albuterol (PROVENTIL) (2.5 MG/3ML) 0.083% nebulizer solution Inhale 3 mLs (2.5 mg total) into the lungs every 4 (four) hours as needed for shortness of breath. 75 mL 12 Past Month   albuterol (VENTOLIN HFA) 108 (90 Base) MCG/ACT inhaler Inhale 2 puffs into the lungs every 4 (four) hours as needed for wheezing or shortness of breath. 1 each 3 Past Week   bisacodyl (FLEET) 10 MG/30ML ENEM Place 10 mg rectally daily.   Past Week   busPIRone (BUSPAR) 10 MG tablet Take 20 mg by mouth 3 (three) times daily.   05/12/2023   donepezil (ARICEPT) 5 MG tablet Take 5 mg by mouth daily.   05/12/2023   ezetimibe (ZETIA) 10 MG tablet Take 10 mg by mouth daily.   05/13/2023   furosemide (LASIX) 20 MG tablet Take 3 tablets (60 mg total) by mouth daily. 270 tablet 3 05/13/2023   hydrOXYzine (ATARAX) 50 MG tablet Take 50 mg by mouth 2 (two) times daily.   05/13/2023   hyoscyamine (LEVSIN SL) 0.125 MG SL tablet  Take 0.125 mg by mouth daily as needed for cramping.   Past Week   ibuprofen (ADVIL) 800 MG tablet Take 800 mg by mouth every 8 (eight) hours as needed for mild pain or moderate pain.   Past Week   insulin regular human CONCENTRATED (HUMULIN R) 500 UNIT/ML injection Inject 12-20 Units into the skin 3 (three) times daily before meals. Sliding scale 3 times daily   05/13/2023   loratadine (CLARITIN) 10 MG tablet Take 10 mg by mouth daily.   05/13/2023   losartan (COZAAR) 50 MG tablet Take 50 mg by mouth daily.   05/13/2023   omeprazole (PRILOSEC) 40 MG capsule Take 40 mg by mouth daily.   05/13/2023   OZEMPIC, 0.25 OR 0.5 MG/DOSE, 2 MG/3ML SOPN Inject 0.5 mg into the skin once a week. Wednesday   Past Week   phenytoin (DILANTIN) 100 MG ER capsule Take 400 mg by mouth daily.   05/13/2023   polyethylene glycol powder (GLYCOLAX/MIRALAX) 17 GM/SCOOP powder Take 1 Container by mouth once. Takes 1 scoop per day   Past Week   prazosin (MINIPRESS) 1 MG capsule Take 1 mg by mouth in the morning. Take 1mg  (1 tab) in AM. Take 2mg  (2 tab) at bedtime (per patient statement).   05/13/2023  rosuvastatin (CRESTOR) 40 MG tablet Take 1 tablet by mouth daily.   05/13/2023   spironolactone (ALDACTONE) 25 MG tablet Take 0.5 tablets (12.5 mg total) by mouth daily. 45 tablet 11 05/13/2023   tiZANidine (ZANAFLEX) 4 MG capsule Take 4 mg by mouth 3 (three) times daily.   05/13/2023   topiramate (TOPAMAX) 100 MG tablet Take 200 mg by mouth 2 (two) times daily.   05/13/2023   trazodone (DESYREL) 300 MG tablet Take 300 mg by mouth at bedtime as needed for sleep.   05/12/2023   warfarin (COUMADIN) 3 MG tablet Take 3 mg by mouth daily.   05/13/2023   fluticasone furoate-vilanterol (BREO ELLIPTA) 100-25 MCG/INH AEPB Inhale 1 puff into the lungs daily as needed (for shortness of breath). (Patient not taking: Reported on 05/13/2023)   Not Taking    Assessment: Donna Snow is a 72yoF who presented from home due to cellulitis and  possible UTI. Patient is on chronic warfarin for Afib and also Dilantin for history of seizures. Pharmacy consulted to manage warfarin while inpatient. Patient took warfarin dose 12/29. PTA list patient reports warfarin 3 mg daily. Most recent Rx from 05/10/23 is warfarin 5mg  daily and noted to d/c 6mg  tablet.  INR is therapeutic today at 3. Started on broad spectrum antibiotics which can lead to increased INR. CBC stable. Attempted to contact daughter to clarify home warfarin regimen. Pt believes she takes 5mg  daily but is unsure.  Goal of Therapy:  INR 2-3 Monitor platelets by anticoagulation protocol: Yes   Plan:  Warfarin 2mg  PO x1 (reduced from home dose) Monitor daily INR, CBC, clinical course, s/sx of bleed, PO intake/diet, Drug-Drug Interactions  Rexford Maus, PharmD, BCPS 05/14/2023 7:17 AM

## 2023-05-14 NOTE — Progress Notes (Addendum)
HD#0 SUBJECTIVE:  Patient Summary: Donna Snow is a 72 y.o. female with pertinent PMH of 2 diabetes on insulin who presented with left leg pain and is admitted for cellulitis.   Overnight Events: None  Interim History: In significant pain refractory to current pain medication. No appetite since admission. Endorses HA.   OBJECTIVE:  Vital Signs: Vitals:   05/13/23 1539 05/13/23 2021 05/14/23 0528 05/14/23 0752  BP: (!) 110/43 (!) 123/49 (!) 132/57 (!) 136/56  Pulse: 93 94 83 88  Resp: 20  18 17   Temp: 98.7 F (37.1 C) 99.6 F (37.6 C) 98.5 F (36.9 C) 98.6 F (37 C)  TempSrc: Oral Oral Oral   SpO2: 94% 90% 93% 91%   Supplemental O2: Room Air SpO2: 91 %  There were no vitals filed for this visit.   Intake/Output Summary (Last 24 hours) at 05/14/2023 1353 Last data filed at 05/14/2023 1216 Gross per 24 hour  Intake 940 ml  Output 800 ml  Net 140 ml   Net IO Since Admission: 140 mL [05/14/23 1353]  Physical Exam: Physical Exam Constitutional:      General: She is not in acute distress.    Appearance: She is obese. She is ill-appearing.  Musculoskeletal:     Right lower leg: No edema.     Left lower leg: Edema present.     Comments: Circumferential erythema at lower L leg, diffuse nonpitting edema and increased warmth in comparison to R leg. Edema and tenderness extends as far as knee. Erythema extends to mid-shin on lateral face, increased from yesterday.  Skin:    General: Skin is warm.  Neurological:     Mental Status: She is alert. Mental status is at baseline.  Psychiatric:        Mood and Affect: Mood normal.        Behavior: Behavior normal.     Patient Lines/Drains/Airways Status     Active Line/Drains/Airways     Name Placement date Placement time Site Days   Peripheral IV 05/13/23 20 G Posterior;Left Hand 05/13/23  1003  Hand  1   Wound / Incision (Open or Dehisced) 02/14/22 Abdomen Right;Upper Abscess 02/14/22  2154  Abdomen  454              ASSESSMENT/PLAN:  Assessment: Principal Problem:   Cellulitis of left foot  Donna Snow is a 72 y.o. female with pertinent PMH of 2 diabetes on insulin who presented with left leg pain and is admitted for cellulitis.   Plan: Cellulitis of left lower extremity Patient continues to be in pain. Erythema has spread. L leg is warm. Might be secondary to dog scratch about 1 week ago. White count resolving. Well perfused and hemodynamically stable.  Imaging is unrevealing, importantly ruling out DVT. Will continue treatment of cellulitis with IV antibiotics and pain control. - Vancomycin, cefepime IV - For pain Tylenol 1000 every 8 hours, oxycodone 10 mg every46 hours as needed. Senna-docusate 1 tablet oral at bedtime as bowel regimen. -Trend white count and follow-up blood cultures   AGMA - possible mild DKA T2DM Pt with new anion gap of 16, low bicarb. Mild elevation in ketones on blood assay. Most recent A1c is 7.4%. Takes Humulin R 11-20 units TID before meals. She does not have symptoms concerning for DKA. She has some stomach upset but it is ongoing for several days and likely related to her cellulitis. In setting of ketosis, will schedule long-acting insulin and ensure oral rehydration. 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 Needs fleet enemas in order to have BM. Will also ask for bedside commode.  Afib Rate/Rhythm controlled without medicine. She takes warfarin outpatient. Will continue here per pharmacy.   Chronic CHF without exacerbation - Hold home lasix 60 BID, spironolactone 12.5mg  every day, losartan 50mg  every day in setting of soft Bps. Check daily weights.   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  Will continue topiramate 200mg  po BID, Phenytoin 400mg  po qd. HLD - Continue home  rosuvastatin 40mg  every day and zetia 10mg  every day  GERD - Continue home Protonix 40mg  qd Anxiety - Continue home Buspar 20mg  TID. Atarax 50mg   Night Terrors - Home prazosin 1mg  am and 2mg  at bedtime HTN - Holidng home medicines per above  Best Practice: Diet: Cardiac diet IVF: None VTE: warfarin per afib Code: Full AB: Vancomycin, Cefepime DISPO: Anticipated discharge in 4 days to Home pending  Pain and infection improvement .  Signature: Katheran James, D.O.  Internal Medicine Resident, PGY-1 [facility]  Pager: [phone] 1:53 PM, 05/14/2023   Please contact the on call pager after 5 pm and on weekends at [phone].

## 2023-05-14 NOTE — Inpatient Diabetes Management (Signed)
Inpatient Diabetes Program Recommendations  AACE/ADA: New Consensus Statement on Inpatient Glycemic Control   Target Ranges:  Prepandial:   less than 140 mg/dL      Peak postprandial:   less than 180 mg/dL (1-2 hours)      Critically ill patients:  140 - 180 mg/dL    Latest Reference Range & Units 05/13/23 21:17 05/14/23 05:57  Glucose-Capillary 70 - 99 mg/dL 696 (H) 295 (H)   Review of Glycemic Control  Diabetes history: DM2 Outpatient Diabetes medications: Humulin R U500 12-20 units TID with meals (drawing up with U100 syringe so dose is 60-100 units TID), Ozempic 0.5 mg Qweek (Wednesday), Metformin BID (not sure of dose) Current orders for Inpatient glycemic control: Novolog 0-15 units TID with meals  Inpatient Diabetes Program Recommendations:    Insulin: Please consider changing CBGs to Q4H, changing Novolog correction to 0-20 units Q4H, and ordering Semglee 25 units Q24H.  NOTE: Spoke with patient over phone to inquire about DM control and medications. Patient reports that she is taking Humulin R U500 (drawing up in U100 syringe) 12-20 units TID, Ozempic 0.5 mg Qweek (Wednesday), and Metformin BID for DM control. Patient is not sure of Metformin dose. Patient reports that her glucose has been running high over the past week and she has been taking Humulin R U500 20 units TID even with poor PO intake (no appetite). Patient is states that prior to past week or so, her glucose usually runs in the 120-150's mg/dl. Patient reports that she currently still does not have an appetite. Discussed currently only ordered Novolog correction insulin; informed patient it would be requested to order Semglee insulin today and glucose trends would be followed while inpatient and insulin adjusted if needed. Patient verbalized understanding and has no questions at this time.  Thanks, Orlando Penner, RN, MSN, CDCES Diabetes Coordinator Inpatient Diabetes Program [phone] (Team Pager from 8am to 5pm)

## 2023-05-15 ENCOUNTER — Encounter (HOSPITAL_COMMUNITY): Payer: Self-pay | Admitting: Internal Medicine

## 2023-05-15 DIAGNOSIS — I4891 Unspecified atrial fibrillation: Secondary | ICD-10-CM | POA: Diagnosis not present

## 2023-05-15 DIAGNOSIS — K5900 Constipation, unspecified: Secondary | ICD-10-CM | POA: Diagnosis not present

## 2023-05-15 DIAGNOSIS — K5909 Other constipation: Secondary | ICD-10-CM | POA: Diagnosis present

## 2023-05-15 DIAGNOSIS — E119 Type 2 diabetes mellitus without complications: Secondary | ICD-10-CM | POA: Diagnosis not present

## 2023-05-15 DIAGNOSIS — A419 Sepsis, unspecified organism: Secondary | ICD-10-CM | POA: Diagnosis present

## 2023-05-15 DIAGNOSIS — L03116 Cellulitis of left lower limb: Secondary | ICD-10-CM | POA: Diagnosis not present

## 2023-05-15 LAB — GLUCOSE, CAPILLARY
Glucose-Capillary: 223 mg/dL — ABNORMAL HIGH (ref 70–99)
Glucose-Capillary: 264 mg/dL — ABNORMAL HIGH (ref 70–99)
Glucose-Capillary: 248 mg/dL — ABNORMAL HIGH (ref 70–99)
Glucose-Capillary: 221 mg/dL — ABNORMAL HIGH (ref 70–99)
Glucose-Capillary: 220 mg/dL — ABNORMAL HIGH (ref 70–99)

## 2023-05-15 LAB — BASIC METABOLIC PANEL
Anion gap: 13 (ref 5–15)
BUN: 10 mg/dL (ref 8–23)
CO2: 17 mmol/L — ABNORMAL LOW (ref 22–32)
Calcium: 8.5 mg/dL — ABNORMAL LOW (ref 8.9–10.3)
Chloride: 105 mmol/L (ref 98–111)
Creatinine, Ser: 0.84 mg/dL (ref 0.44–1.00)
GFR, Estimated: >60 mL/min (ref >60)
Glucose, Bld: 247 mg/dL — ABNORMAL HIGH (ref 70–99)
Potassium: 3.8 mmol/L (ref 3.5–5.1)
Sodium: 135 mmol/L (ref 135–145)

## 2023-05-15 LAB — BLOOD CULTURE ID PANEL (REFLEXED) - BCID2

## 2023-05-15 LAB — PROTIME-INR
INR: 3 — ABNORMAL HIGH (ref 0.8–1.2)
Prothrombin Time: 31 s — ABNORMAL HIGH (ref 11.4–15.2)

## 2023-05-15 LAB — CBC
HCT: 39.4 % (ref 36.0–46.0)
Hemoglobin: 12.9 g/dL (ref 12.0–15.0)
MCH: 28.2 pg (ref 26.0–34.0)
MCHC: 32.7 g/dL (ref 30.0–36.0)
MCV: 86 fL (ref 80.0–100.0)
Platelets: 264 10*3/uL (ref 150–400)
RBC: 4.58 MIL/uL (ref 3.87–5.11)
RDW: 13.8 % (ref 11.5–15.5)
WBC: 10.6 10*3/uL — ABNORMAL HIGH (ref 4.0–10.5)
nRBC: 0.2 % (ref 0.0–0.2)

## 2023-05-15 MED ORDER — POLYETHYLENE GLYCOL 3350 17 G PO PACK
17.0000 g | PACK | Freq: Every day | ORAL | Status: DC
  Administered 2023-05-15 – 2023-05-16 (×2): 17 g via ORAL
  Filled 2023-05-15 (×2): qty 1

## 2023-05-15 MED ORDER — FLUOXETINE HCL 20 MG PO CAPS
60.0000 mg | ORAL_CAPSULE | Freq: Every day | ORAL | Status: DC
  Administered 2023-05-15 – 2023-05-21 (×7): 60 mg via ORAL
  Filled 2023-05-15 (×7): qty 3

## 2023-05-15 MED ORDER — FUROSEMIDE 40 MG PO TABS
60.0000 mg | ORAL_TABLET | Freq: Every day | ORAL | Status: DC
Start: 2023-05-15 — End: 2023-05-25
  Administered 2023-05-15 – 2023-05-25 (×11): 60 mg via ORAL
  Filled 2023-05-15 (×11): qty 1

## 2023-05-15 MED ORDER — WARFARIN SODIUM 2 MG PO TABS
2.0000 mg | ORAL_TABLET | Freq: Once | ORAL | Status: AC
  Administered 2023-05-15: 2 mg via ORAL
  Filled 2023-05-15: qty 1

## 2023-05-15 MED ORDER — INSULIN GLARGINE-YFGN 100 UNIT/ML ~~LOC~~ SOLN
20.0000 [IU] | Freq: Every day | SUBCUTANEOUS | Status: DC
  Filled 2023-05-15: qty 0.2

## 2023-05-15 MED ORDER — ONDANSETRON HCL 4 MG/2ML IJ SOLN
4.0000 mg | Freq: Three times a day (TID) | INTRAMUSCULAR | Status: DC | PRN
  Administered 2023-05-15 – 2023-05-28 (×7): 4 mg via INTRAVENOUS
  Filled 2023-05-15 (×7): qty 2

## 2023-05-15 MED ORDER — SENNOSIDES-DOCUSATE SODIUM 8.6-50 MG PO TABS
2.0000 | ORAL_TABLET | Freq: Every day | ORAL | Status: DC
  Administered 2023-05-16 – 2023-05-29 (×11): 2 via ORAL
  Filled 2023-05-15 (×13): qty 2

## 2023-05-15 MED ORDER — INSULIN GLARGINE-YFGN 100 UNIT/ML ~~LOC~~ SOLN
20.0000 [IU] | Freq: Two times a day (BID) | SUBCUTANEOUS | Status: DC
  Administered 2023-05-15 (×2): 20 [IU] via SUBCUTANEOUS
  Filled 2023-05-15 (×3): qty 0.2

## 2023-05-15 MED ORDER — HYDROMORPHONE HCL 2 MG PO TABS
2.0000 mg | ORAL_TABLET | ORAL | Status: DC | PRN
  Administered 2023-05-15 – 2023-05-17 (×11): 2 mg via ORAL
  Filled 2023-05-15 (×11): qty 1

## 2023-05-15 MED ORDER — TRAZODONE HCL 50 MG PO TABS
200.0000 mg | ORAL_TABLET | Freq: Every evening | ORAL | Status: DC | PRN
  Administered 2023-05-15 – 2023-05-22 (×7): 200 mg via ORAL
  Filled 2023-05-15 (×7): qty 4

## 2023-05-15 MED ORDER — INSULIN ASPART 100 UNIT/ML IJ SOLN
0.0000 [IU] | Freq: Three times a day (TID) | INTRAMUSCULAR | Status: DC
  Administered 2023-05-15: 2 [IU] via SUBCUTANEOUS
  Administered 2023-05-15 – 2023-05-16 (×3): 7 [IU] via SUBCUTANEOUS
  Administered 2023-05-16: 2 [IU] via SUBCUTANEOUS
  Administered 2023-05-17: 4 [IU] via SUBCUTANEOUS
  Administered 2023-05-17: 7 [IU] via SUBCUTANEOUS
  Administered 2023-05-17: 3 [IU] via SUBCUTANEOUS
  Administered 2023-05-18: 7 [IU] via SUBCUTANEOUS
  Administered 2023-05-18 (×2): 3 [IU] via SUBCUTANEOUS
  Administered 2023-05-19 (×2): 7 [IU] via SUBCUTANEOUS
  Administered 2023-05-19: 4 [IU] via SUBCUTANEOUS
  Administered 2023-05-20: 7 [IU] via SUBCUTANEOUS
  Administered 2023-05-20: 4 [IU] via SUBCUTANEOUS
  Administered 2023-05-20 – 2023-05-21 (×2): 7 [IU] via SUBCUTANEOUS
  Administered 2023-05-21 – 2023-05-22 (×4): 4 [IU] via SUBCUTANEOUS
  Administered 2023-05-22 – 2023-05-23 (×2): 7 [IU] via SUBCUTANEOUS
  Administered 2023-05-23: 11 [IU] via SUBCUTANEOUS
  Administered 2023-05-23 – 2023-05-24 (×2): 15 [IU] via SUBCUTANEOUS
  Administered 2023-05-24: 7 [IU] via SUBCUTANEOUS
  Administered 2023-05-24: 4 [IU] via SUBCUTANEOUS
  Administered 2023-05-25: 11 [IU] via SUBCUTANEOUS
  Administered 2023-05-25: 20 [IU] via SUBCUTANEOUS
  Administered 2023-05-25: 7 [IU] via SUBCUTANEOUS
  Administered 2023-05-26: 11 [IU] via SUBCUTANEOUS
  Administered 2023-05-26 (×2): 4 [IU] via SUBCUTANEOUS
  Administered 2023-05-27: 3 [IU] via SUBCUTANEOUS
  Administered 2023-05-27: 4 [IU] via SUBCUTANEOUS
  Administered 2023-05-27 – 2023-05-28 (×2): 7 [IU] via SUBCUTANEOUS
  Administered 2023-05-28: 4 [IU] via SUBCUTANEOUS
  Administered 2023-05-28 – 2023-05-29 (×2): 7 [IU] via SUBCUTANEOUS
  Administered 2023-05-29: 11 [IU] via SUBCUTANEOUS

## 2023-05-15 NOTE — Progress Notes (Signed)
  Progress Note   Date: 05/15/2023  Patient Name: Donna Snow        MRN#: [id number]  Clarification of the type of atrial fibrillation:  Paroxysmal atrial fibrillation

## 2023-05-15 NOTE — Progress Notes (Signed)
 PHARMACY - ANTICOAGULATION CONSULT NOTE  Pharmacy Consult for warfarin Indication: atrial fibrillation  Allergies  Allergen Reactions   Latex Other (See Comments)    Welts - cant wear them, but can be touched by someone who wears them.   Penicillins Hives and Itching    Tolerated ceftriaxone  10/2020  facial swelling   Vital Signs: Temp: 98.4 F (36.9 C) (12/31 0434) Temp Source: Oral (12/31 0434) BP: 146/64 (12/31 0434) Pulse Rate: 80 (12/30 2002)  Labs: Recent Labs    05/13/23 0900 05/14/23 0533 05/14/23 1526 05/15/23 0547  HGB 12.6 12.6  --  12.9  HCT 38.3 38.2  --  39.4  PLT 240 253  --  264  APTT 60*  --   --   --   LABPROT 30.5* 31.1*  --  31.0*  INR 2.9* 3.0*  --  3.0*  CREATININE 1.23* 0.93 0.83 0.84    CrCl cannot be calculated (Unknown ideal weight.).   Medical History: Past Medical History:  Diagnosis Date   Anxiety    CHF (congestive heart failure) (HCC)    Diabetes mellitus    Hypercholesteremia    Hypertension    Obesity    Seizures (HCC)    Stroke [facility])      Assessment: Donna Snow is a 72yoF who presented from home due to cellulitis and possible UTI. Patient is on chronic warfarin for Afib and also Dilantin  for history of seizures. Pharmacy consulted to manage warfarin while inpatient. Patient took warfarin dose 12/29. Most recent Rx 05/10/23 is warfarin 5mg  daily (previous 6mg  tablet discontinued). Called daughter who said patient does her own medications. Daughter tried to review or get a list from patient but hasn't been able to yet so she does not know what medications patient takes. Per patient, 5mg  still caused INR to be high so dose was reduced to 3 point something but she's not exacty sure. Unable to see outpatient warfarin notes.  INR is at higher end of therapeutic today at 3. Started on broad spectrum antibiotics which can lead to increased INR. Phenytoin  resumed from home. CBC stable. No signs of bleeding.    Goal of Therapy:   INR 2-3 Monitor platelets by anticoagulation protocol: Yes   Plan:  Warfarin 2mg  PO x1   Monitor daily INR, CBC, clinical course, s/sx of bleed, PO intake/diet,  Jinnie Door, PharmD, [place], [facility] Clinical Pharmacist  Please check AMION for all [facility] Pharmacy phone numbers After 10:00 PM, call Main Pharmacy [phone]'

## 2023-05-15 NOTE — Progress Notes (Addendum)
 HD#1 SUBJECTIVE:  Patient Summary: Donna Snow is a 72 y.o. female with pertinent PMH of 2 diabetes on insulin  who presented with left leg pain and is admitted for cellulitis.   Overnight Events: None  Interim History: Continues to be in pain.  Some nausea as a result of the pain.  She is worried that she will lose her foot.  No reported fevers or chills. Headache has eased.    OBJECTIVE:  Vital Signs: Vitals:   05/15/23 0434 05/15/23 0754 05/15/23 1011 05/15/23 1315  BP: (!) 146/64 122/62  (!) 137/56  Pulse:  96  87  Resp: 18 17  16   Temp: 98.4 F (36.9 C) 98.1 F (36.7 C)  98.4 F (36.9 C)  TempSrc: Oral Oral  Oral  SpO2: 95% 94%  93%  Weight:   (!) 143.5 kg   Height:   5' 4 (1.626 m)    Supplemental O2: Room Air SpO2: 93 %   Intake/Output Summary (Last 24 hours) at 05/15/2023 1615 Last data filed at 05/15/2023 1100 Gross per 24 hour  Intake 800 ml  Output 1300 ml  Net -500 ml   Net IO Since Admission: -360 mL [05/15/23 1615]  Physical Exam: Physical Exam Constitutional:      Appearance: She is obese.  Cardiovascular:     Rate and Rhythm: Normal rate.  Pulmonary:     Effort: Pulmonary effort is normal.  Abdominal:     General: Abdomen is flat.     Tenderness: There is no abdominal tenderness.  Musculoskeletal:     Right lower leg: No edema.     Left lower leg: Edema present.     Comments: Stable rash on L leg. Low leg, circumferential at base, no open lesions or drainage, some blistering, swelling has improved but it remains, it still extends to the knee.  Neurological:     Mental Status: She is alert.     Patient Lines/Drains/Airways Status     Active Line/Drains/Airways     Name Placement date Placement time Site Days   Peripheral IV 05/14/23 20 G 1.88 Left;Posterior Forearm 05/14/23  2333  Forearm  1   Wound / Incision (Open or Dehisced) 02/14/22 Abdomen Right;Upper Abscess 02/14/22  2154  Abdomen  455             ASSESSMENT/PLAN:   Assessment: Principal Problem:   Cellulitis of left lower extremity from knee to ankle Active Problems:   Sepsis without acute organ dysfunction (HCC), resolved   Hyperglycemia due to diabetes mellitus (HCC)   Type 2 diabetes mellitus (HCC)   AF (paroxysmal atrial fibrillation) (HCC)   Chronic anticoagulation - on coumadin  for afib      Donna Snow is a 72 y.o. female with pertinent PMH of T2 diabetes on insulin  who presented with left leg pain and is admitted for cellulitis.    Plan: Cellulitis of left lower extremity Patient continues to be in significant pain with oxycodone  5 mg but trial of dilaudid  helped. Dark erythema and dermal edema stable in circumferential lower leg. L leg remains hot, and swelling and tenderness extend to knee and distal medial thigh. White count resolving. Well perfused and hemodynamically stable.  Imaging is unrevealing, no DVT. Continue IV antibiotics and pain control. - Vancomycin , cefepime  IV now day 3 - For pain Tylenol  1000 every 8 hours, Dilaudid  2 mg every 2h as needed. 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 -Trend white count and  blood cultures no growth 2 days   T2DM AGMA - possible mild DKA (resolved) Mild AG resolved with insulin . Most recent A1c is 7.4%. Takes Humulin R  11-20 units TID before meals, typically 300 units daily at home. She has some stomach upset but it is ongoing for several days and likely related to her cellulitis. 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  Senna-docusate 1 tablet oral at bedtime and miralax  prn as bowel regimen.  Often needs fleet enemas in order to have BM. Has bedside commode.  Ambulatory dysfunction due to severe pain Some difficulty with walking, getting to toilet. RN assisting. PT consult.   Afib Rate/Rhythm controlled  without medicine. She takes warfarin outpatient. Will continue here per pharmacy.   Chronic CHF without exacerbation - Resume lasix  60 every day (home dose is BID). Hold spironolactone  12.5mg  every day, losartan  50mg  every day in setting of low normal Bps. Check daily weights.   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  Will continue topiramate  200mg  po BID, Phenytoin  400mg  po qd. HLD - Continue home rosuvastatin  40mg  every day and zetia  10mg  every day  GERD - Continue home Protonix  40mg  qd Anxiety - Continue home Buspar  20mg  TID. Atarax  50mg   Night Terrors - Home prazosin  1mg  am and 2mg  at bedtime Insomnia - Trazodone  200 at bedtime, home dose is 300mg  HTN - Holidng home medicines per above  Best Practice: Diet: Regular diet IVF: none VTE: warfarin Code: Full AB: Vancomycin  and Cefepime  Therapy Recs: PT to see DISPO: Anticipated discharge in 2-3 days to Home pending  pain/infection control .  Signature: Lonni Africa, D.O.  Internal Medicine Resident, PGY-1 [facility]  Pager: [phone] 4:15 PM, 05/15/2023   Please contact the on call pager after 5 pm and on weekends at [phone].

## 2023-05-15 NOTE — Inpatient Diabetes Management (Signed)
 Inpatient Diabetes Program Recommendations  AACE/ADA: New Consensus Statement on Inpatient Glycemic Control  Target Ranges:  Prepandial:   less than 140 mg/dL      Peak postprandial:   less than 180 mg/dL (1-2 hours)      Critically ill patients:  140 - 180 mg/dL    Latest Reference Range & Units 05/14/23 05:57 05/14/23 12:04 05/14/23 17:14 05/14/23 20:03 05/15/23 06:11 05/15/23 08:31  Glucose-Capillary 70 - 99 mg/dL 746 (H) 748 (H) 791 (H) 229 (H) 223 (H) 264 (H)   Review of Glycemic Control  Diabetes history: DM2 Outpatient Diabetes medications: Humulin R  U500 12-20 units TID with meals (drawing up with U100 syringe so dose is 60-100 units TID), Ozempic  0.5 mg Qweek (Wednesday), Metformin BID (not sure of dose) Current orders for Inpatient glycemic control: Semglee  20 units daily, Novolog  0-15 units TID with meals   Inpatient Diabetes Program Recommendations:     Insulin : Noted Semglee  increased from 15 to 20 units daily.  Please consider increasing Novolog  to 0-20 units AC&HS and increasing Semglee  further to 25 units Q24H.  Thanks, Earnie [facility], RN, MSN, CDCES Diabetes Coordinator Inpatient Diabetes Program [phone] (Team Pager from 8am to 5pm)

## 2023-05-15 NOTE — Progress Notes (Signed)
  Progress Note   Date: 05/15/2023  Patient Name: Donna Snow        MRN#: [id number]   Clarification of diagnosis:   Etiology: due to local infection (specify infection), due to device/implant/prosthesis or other medical or surgical care (specify), postoperative  Sepsis ruled in due to LLE cellulitis, organisms unknown, suspected to be gram positive. No end organ damage, sepsis now resolved.

## 2023-05-15 NOTE — Progress Notes (Signed)
  Progress Note   Date: 05/15/2023  Patient Name: Donna Snow        MRN#: [id number]  Clarification of diagnosis:  Acute heart failure has not been present.  She has chronic HFpEF.

## 2023-05-16 ENCOUNTER — Inpatient Hospital Stay (HOSPITAL_COMMUNITY): Payer: Medicare Other

## 2023-05-16 DIAGNOSIS — K5900 Constipation, unspecified: Secondary | ICD-10-CM | POA: Diagnosis not present

## 2023-05-16 DIAGNOSIS — E119 Type 2 diabetes mellitus without complications: Secondary | ICD-10-CM | POA: Diagnosis not present

## 2023-05-16 DIAGNOSIS — L03116 Cellulitis of left lower limb: Secondary | ICD-10-CM | POA: Diagnosis not present

## 2023-05-16 DIAGNOSIS — I4891 Unspecified atrial fibrillation: Secondary | ICD-10-CM | POA: Diagnosis not present

## 2023-05-16 LAB — PROTIME-INR
INR: 3 — ABNORMAL HIGH (ref 0.8–1.2)
Prothrombin Time: 31.6 s — ABNORMAL HIGH (ref 11.4–15.2)

## 2023-05-16 LAB — CBC
HCT: 37.9 % (ref 36.0–46.0)
Hemoglobin: 12.8 g/dL (ref 12.0–15.0)
MCH: 28.1 pg (ref 26.0–34.0)
MCHC: 33.8 g/dL (ref 30.0–36.0)
MCV: 83.3 fL (ref 80.0–100.0)
Platelets: 320 10*3/uL (ref 150–400)
RBC: 4.55 MIL/uL (ref 3.87–5.11)
RDW: 13.6 % (ref 11.5–15.5)
WBC: 12 10*3/uL — ABNORMAL HIGH (ref 4.0–10.5)
nRBC: 0 % (ref 0.0–0.2)

## 2023-05-16 LAB — BASIC METABOLIC PANEL
Anion gap: 10 (ref 5–15)
BUN: 9 mg/dL (ref 8–23)
CO2: 25 mmol/L (ref 22–32)
Calcium: 8.5 mg/dL — ABNORMAL LOW (ref 8.9–10.3)
Chloride: 98 mmol/L (ref 98–111)
Creatinine, Ser: 0.77 mg/dL (ref 0.44–1.00)
GFR, Estimated: >60 mL/min (ref >60)
Glucose, Bld: 215 mg/dL — ABNORMAL HIGH (ref 70–99)
Potassium: 3.6 mmol/L (ref 3.5–5.1)
Sodium: 133 mmol/L — ABNORMAL LOW (ref 135–145)

## 2023-05-16 LAB — GLUCOSE, CAPILLARY
Glucose-Capillary: 219 mg/dL — ABNORMAL HIGH (ref 70–99)
Glucose-Capillary: 159 mg/dL — ABNORMAL HIGH (ref 70–99)
Glucose-Capillary: 217 mg/dL — ABNORMAL HIGH (ref 70–99)
Glucose-Capillary: 170 mg/dL — ABNORMAL HIGH (ref 70–99)

## 2023-05-16 MED ORDER — WARFARIN SODIUM 1 MG PO TABS
1.0000 mg | ORAL_TABLET | Freq: Once | ORAL | Status: AC
  Administered 2023-05-16: 1 mg via ORAL
  Filled 2023-05-16: qty 1

## 2023-05-16 MED ORDER — POLYETHYLENE GLYCOL 3350 17 G PO PACK
17.0000 g | PACK | Freq: Two times a day (BID) | ORAL | Status: DC
  Administered 2023-05-16 – 2023-05-29 (×14): 17 g via ORAL
  Filled 2023-05-16 (×20): qty 1

## 2023-05-16 MED ORDER — PANTOPRAZOLE SODIUM 40 MG PO TBEC
40.0000 mg | DELAYED_RELEASE_TABLET | Freq: Two times a day (BID) | ORAL | Status: DC
  Administered 2023-05-16 – 2023-05-29 (×26): 40 mg via ORAL
  Filled 2023-05-16 (×26): qty 1

## 2023-05-16 MED ORDER — IOHEXOL 350 MG/ML SOLN
75.0000 mL | Freq: Once | INTRAVENOUS | Status: AC | PRN
  Administered 2023-05-16: 75 mL via INTRAVENOUS

## 2023-05-16 MED ORDER — PANTOPRAZOLE SODIUM 40 MG PO TBEC: 40.0000 mg | DELAYED_RELEASE_TABLET | Freq: Two times a day (BID) | ORAL | Status: DC

## 2023-05-16 MED ORDER — SALINE SPRAY 0.65 % NA SOLN
1.0000 | NASAL | Status: DC | PRN
  Filled 2023-05-16 (×2): qty 44

## 2023-05-16 MED ORDER — PANTOPRAZOLE SODIUM 40 MG PO TBEC: 80.0000 mg | DELAYED_RELEASE_TABLET | Freq: Two times a day (BID) | ORAL | Status: DC

## 2023-05-16 MED ORDER — INSULIN GLARGINE-YFGN 100 UNIT/ML ~~LOC~~ SOLN
24.0000 [IU] | Freq: Two times a day (BID) | SUBCUTANEOUS | Status: DC
  Administered 2023-05-16 – 2023-05-22 (×13): 24 [IU] via SUBCUTANEOUS
  Filled 2023-05-16 (×15): qty 0.24

## 2023-05-16 NOTE — Progress Notes (Signed)
 HD#2 SUBJECTIVE:  Patient Summary: Donna Snow is a 73 y.o. female with pertinent PMH of 2 diabetes on insulin  who presented with left leg pain and is admitted for cellulitis.    Overnight Events: Report of dyspnea without hypoxia that was relieved with CPAP which she uses at home.  Interim History: Still in pain. Dilaudid  initiated yesterday does help. That said, pain is now higher in L leg, particularly around the L knee. Ascribes to dyspnea that she attributes to anxiety. Notes trouble walking due to L pain, an issue that was not present before hospitalization.  OBJECTIVE:  Vital Signs: Vitals:   05/15/23 2324 05/16/23 0110 05/16/23 0510 05/16/23 0801  BP: (!) 139/52  (!) 128/59 (!) 145/64  Pulse: 94  91 89  Resp: 18 (!) 22 (!) 21 18  Temp: 98.6 F (37 C)  98.1 F (36.7 C) 98.3 F (36.8 C)  TempSrc: Oral  Oral Oral  SpO2: 96% 97% 92% 95%  Weight:      Height:       Supplemental O2: Room Air SpO2: 95 %  Filed Weights   05/15/23 1011  Weight: (!) 143.5 kg     Intake/Output Summary (Last 24 hours) at 05/16/2023 0801 Last data filed at 05/16/2023 0100 Gross per 24 hour  Intake 660 ml  Output 2700 ml  Net -2040 ml   Net IO Since Admission: -2,700 mL [05/16/23 0801]  Physical Exam: Physical Exam Constitutional:      General: She is not in acute distress.    Appearance: She is obese. She is ill-appearing.  HENT:     Nose: Nose normal.  Cardiovascular:     Rate and Rhythm: Normal rate.     Pulses: Normal pulses.  Pulmonary:     Effort: Pulmonary effort is normal.     Breath sounds: No wheezing or rales.  Abdominal:     General: Abdomen is flat. There is no distension.     Tenderness: There is no abdominal tenderness.  Musculoskeletal:        General: Swelling and tenderness present.     Right lower leg: No edema.     Left lower leg: Edema present.     Comments: [name]. Locus of infection at lower leg appears to be improving - less redness and  swelling. However there is increased swelling around the R knee with some erythema radiating upward. There is a previous surgical scar on the R lateral leg that is newly erythematous and is tender. The R knee is acutely warm and there is clear demarcation with more appropriate skin temp on the upper leg.  Skin:    General: Skin is warm and dry.     Findings: Rash present.  Neurological:     General: No focal deficit present.     Mental Status: She is alert.  Psychiatric:        Mood and Affect: Mood normal.        Behavior: Behavior normal.     Patient Lines/Drains/Airways Status     Active Line/Drains/Airways     Name Placement date Placement time Site Days   Peripheral IV 05/14/23 20 G 1.88 Left;Posterior Forearm 05/14/23  2333  Forearm  2   Wound / Incision (Open or Dehisced) 02/14/22 Abdomen Right;Upper Abscess 02/14/22  2154  Abdomen  456             ASSESSMENT/PLAN:  Assessment: Principal Problem:   Cellulitis of left lower extremity from knee to  ankle Active Problems:   Hyperglycemia due to diabetes mellitus (HCC)   Type 2 diabetes mellitus (HCC)   AF (paroxysmal atrial fibrillation) (HCC)   Chronic anticoagulation - on coumadin  for afib   Sepsis without acute organ dysfunction (HCC)   Chronic constipation  Donna Snow is a 73 y.o. female with pertinent PMH of T2 diabetes on insulin  who presented with left leg pain and is admitted for cellulitis.    Plan: Cellulitis of left lower extremity Sepsis Rule out due to LLE cellulitis (resolved) Concern for septic Joint with L knee replacement Since yesterday, the L knee has become more swollen and painful, focus at previous surgical scar, despite improvement in the lower leg cellulitis. White count has increased mildly to 12. In light of exam change, will pursue CT scan and ortho consult. - Infection focus on L leg appears to improve, but remains still tender and red. Dark erythema and dermal edema stable in  circumferential lower leg. L leg remains hot, and swelling and tenderness extend to knee and distal medial thigh.  - Curbsided ortho on call who feels that she is unlikely to have septic joint at this time but advised continued monitoring. Will monitor WBCs, fever, exam. - Continue IV antibiotics and pain control. - Vancomycin , cefepime  IV now day 4 - For pain Tylenol  1000 every 8 hours, Dilaudid  2 mg every 2h as needed.  - Trend white count and blood cultures: 1/4 with GPCinCs otherwise no growth 3 days (likely contaminant)   T2DM AGMA - possible mild DKA (resolved) Mild AG resolved with insulin . Most recent A1c is 7.4%. Takes Humulin R  11-20 units TID before meals, typically 300 units daily at home. She has some stomach upset but it is ongoing for several days and likely related to her cellulitis. 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  Often needs fleet enemas in order to have BM. Has bedside commode. - Senna-docusate 1 tablet oral BID, Miralax  BID, Fleet enema prn     Dyspnea associated with Anxiety and OSA Overnight she had dyspnea but no hypoxia. CPAP provided at that time and resolved the issue. She does report dyspnea this morning as well, not hypoxic. Attempted reassurance about overall plan, will evaluate need for anxiolytics, oxygen available for comfort. - Continue home Buspar  20mg  TID. Atarax  50mg   Ambulatory dysfunction due to severe pain Some difficulty with walking, getting to toilet. RN assisting. PT consult.   Proxysmal Afib Rate/Rhythm controlled without medicine. She takes warfarin outpatient. Will continue here per pharmacy.   Chronic HFpEF without exacerbation - Resume lasix  60 every day (home dose is BID). Hold spironolactone  12.5mg  every day, losartan  50mg  every day in setting of low normal Bps. Check daily weights.   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  Will continue topiramate  200mg  po BID, Phenytoin  400mg  po qd. HLD - Continue home rosuvastatin  40mg  every day and zetia  10mg  every day  GERD - Continue home Protonix  40mg  qd Night Terrors - Home prazosin  1mg  am and 2mg  at bedtime Insomnia - Trazodone  200 at bedtime, home dose is 300mg  HTN - Holidng home medicines per above   Best Practice: Diet: Regular diet IVF: none VTE: warfarin Code: Full AB: Vancomycin  and Cefepime  Therapy Recs: PT to see DISPO: Anticipated discharge in 2-3 days to Home pending  pain/infection control .  Signature: Lonni Africa, D.O.  Internal Medicine Resident, PGY-1 [facility]  Pager: [phone] 8:01 AM, 05/16/2023   Please contact the on call pager after 5 pm and on weekends at [phone].

## 2023-05-16 NOTE — Progress Notes (Signed)
 PT Cancellation Note  Patient Details Name: Donna Snow MRN: [id number] DOB: 01/05/1951   Cancelled Treatment:    Reason Eval/Treat Not Completed: Patient declined, no reason specified. Pt reports she has had a lot going on this morning and declines PT at this time. Will check back as schedule allows to initiate PT eval.    [name] 05/16/2023, 11:51 AM  Leita Sable, PT, DPT Acute Rehabilitation Services Secure Chat Preferred Office: [phone]

## 2023-05-16 NOTE — Progress Notes (Signed)
 PHARMACY - PHYSICIAN COMMUNICATION CRITICAL VALUE ALERT - BLOOD CULTURE IDENTIFICATION (BCID)  Donna Snow is an 73 y.o. female who presented to [facility] on 05/13/2023 with a chief complaint of cellulitis    Name of physician (or Provider) Contacted: Dr. Tobie  Current antibiotics: Vancomycin , Cefepime   Changes to prescribed antibiotics recommended:  No changes for now  Results for orders placed or performed during the hospital encounter of 05/13/23  Blood Culture ID Panel (Reflexed) (Collected: 05/13/2023 10:10 AM)  Result Value Ref Range   Enterococcus faecalis NOT DETECTED NOT DETECTED   Enterococcus Faecium NOT DETECTED NOT DETECTED   Listeria monocytogenes NOT DETECTED NOT DETECTED   Staphylococcus species NOT DETECTED NOT DETECTED   Staphylococcus aureus (BCID) NOT DETECTED NOT DETECTED   Staphylococcus epidermidis NOT DETECTED NOT DETECTED   Staphylococcus lugdunensis NOT DETECTED NOT DETECTED   Streptococcus species NOT DETECTED NOT DETECTED   Streptococcus agalactiae NOT DETECTED NOT DETECTED   Streptococcus pneumoniae NOT DETECTED NOT DETECTED   Streptococcus pyogenes NOT DETECTED NOT DETECTED   A.calcoaceticus-baumannii NOT DETECTED NOT DETECTED   Bacteroides fragilis NOT DETECTED NOT DETECTED   Enterobacterales NOT DETECTED NOT DETECTED   Enterobacter cloacae complex NOT DETECTED NOT DETECTED   Escherichia coli NOT DETECTED NOT DETECTED   Klebsiella aerogenes NOT DETECTED NOT DETECTED   Klebsiella oxytoca NOT DETECTED NOT DETECTED   Klebsiella pneumoniae NOT DETECTED NOT DETECTED   Proteus species NOT DETECTED NOT DETECTED   Salmonella species NOT DETECTED NOT DETECTED   Serratia marcescens NOT DETECTED NOT DETECTED   Haemophilus influenzae NOT DETECTED NOT DETECTED   Neisseria meningitidis NOT DETECTED NOT DETECTED   Pseudomonas aeruginosa NOT DETECTED NOT DETECTED   Stenotrophomonas maltophilia NOT DETECTED NOT DETECTED   Candida albicans NOT DETECTED NOT  DETECTED   Candida auris NOT DETECTED NOT DETECTED   Candida glabrata NOT DETECTED NOT DETECTED   Candida krusei NOT DETECTED NOT DETECTED   Candida parapsilosis NOT DETECTED NOT DETECTED   Candida tropicalis NOT DETECTED NOT DETECTED   Cryptococcus neoformans/gattii NOT DETECTED NOT DETECTED    Clair Agent 05/16/2023  12:54 AM

## 2023-05-16 NOTE — Progress Notes (Signed)
 PT Cancellation Note  Patient Details Name: Donna Snow MRN: [id number] DOB: 09/08/1950   Cancelled Treatment:    Reason Eval/Treat Not Completed: Patient at procedure or test/unavailable. Pt currently off unit for CT. Will check back as schedule allows to initiate PT evaluation.    [name] 05/16/2023, 10:19 AM  Leita Sable, PT, DPT Acute Rehabilitation Services Secure Chat Preferred Office: [phone]

## 2023-05-16 NOTE — Progress Notes (Signed)
 PHARMACY - ANTICOAGULATION CONSULT NOTE  Pharmacy Consult for warfarin Indication: atrial fibrillation  Allergies  Allergen Reactions   Latex Other (See Comments)    Welts - cant wear them, but can be touched by someone who wears them.   Penicillins Hives and Itching    Tolerated ceftriaxone  10/2020  facial swelling   Vital Signs: Temp: 98.3 F (36.8 C) (01/01 0801) Temp Source: Oral (01/01 0801) BP: 145/64 (01/01 0801) Pulse Rate: 89 (01/01 0801)  Labs: Recent Labs    05/13/23 0900 05/14/23 0533 05/14/23 1526 05/15/23 0547  HGB 12.6 12.6  --  12.9  HCT 38.3 38.2  --  39.4  PLT 240 253  --  264  APTT 60*  --   --   --   LABPROT 30.5* 31.1*  --  31.0*  INR 2.9* 3.0*  --  3.0*  CREATININE 1.23* 0.93 0.83 0.84    Estimated Creatinine Clearance: 87.5 mL/min (by C-G formula based on SCr of 0.84 mg/dL).   Medical History: Past Medical History:  Diagnosis Date   Abdominal wall abscess 10/15/2020   Abdominal wall cellulitis 10/15/2020   Anxiety    Cellulitis 06/04/2021   Cellulitis of abdominal wall 02/14/2022   CHF (congestive heart failure) (HCC)    Diabetes mellitus    Hypercholesteremia    Hypertension    Obesity    Seizures (HCC)    Stroke [facility])      Assessment: Donna Snow is a 72yoF who presented from home due to cellulitis and possible UTI. Patient is on chronic warfarin for Afib and also Dilantin  for history of seizures. Pharmacy consulted to manage warfarin while inpatient. Patient took warfarin dose 12/29. Most recent Rx 05/10/23 is warfarin 5mg  daily (previous 6mg  tablet discontinued). Called daughter who said patient does her own medications. Daughter tried to review or get a list from patient but hasn't been able to yet so she does not know what medications patient takes. Per patient, 5mg  still caused INR to be high so dose was reduced to 3 point something but she's not exacty sure. Unable to see outpatient warfarin notes.  05/16/2023: INR is at  higher end of therapeutic range today at 3.0 after warfarin 2mg  on 12/30 and 12/31. Unclear what dose was taken by patient at home on 12/29. Started on broad spectrum antibiotics which can lead to increased INR. Phenytoin  resumed from home. No CBC to assess this AM but no signs of bleeding.  Will decrease warfarin dose to attempt to center in therapeutic range.   Goal of Therapy:  INR 2-3 Monitor platelets by anticoagulation protocol: Yes   Plan:  Warfarin 1mg  PO x1   Monitor daily INR, CBC, clinical course, s/sx of bleed, PO intake/diet  [name], PharmD, BCPS  PGY2 Pharmacy Resident

## 2023-05-16 NOTE — Progress Notes (Signed)
 Pharmacy Antibiotic Note  Donna Snow is a 73 y.o. female admitted on 05/13/2023 with cellulitis.  Pharmacy has been consulted for cefepime  and vancomycin  dosing.  Patient has a history of T2DM and presented with left leg pain and swelling from a dog scratch. Her blood cultures are now positive for gram positive cocci in clusters, pending speciation.   CrCl has improved significantly since initial dosing on 12/29. Scr on 12/29 was 1.23, Scr with most recent labs is 0.84. Her weight has also been updated. Dose of vancomycin  and cefepime  still appropriate. Will opt to get levels as she is likely at steady state.   Plan: Continue Cefepime  2g IV q12h Continue Vancomycin  750mg  q12 hours (eAUC 431, IBW, Scr 0.84, Vd 0.5)  F/u cultures, LOT, renal func Follow up vancomycin  levels, first measurement scheduled for 1/2 AM   Height: 5' 4 (162.6 cm) (per PT) Weight: (!) 143.5 kg (316 lb 5.8 oz) IBW/kg (Calculated) : 54.7  Temp (24hrs), Avg:98.4 F (36.9 C), Min:98.1 F (36.7 C), Max:98.6 F (37 C)  Recent Labs  Lab 05/13/23 0900 05/13/23 1010 05/14/23 0533 05/14/23 1526 05/15/23 0547  WBC 13.6*  --  11.8*  --  10.6*  CREATININE 1.23*  --  0.93 0.83 0.84  LATICACIDVEN  --  1.9  --   --   --     Estimated Creatinine Clearance: 87.5 mL/min (by C-G formula based on SCr of 0.84 mg/dL).    Allergies  Allergen Reactions   Latex Other (See Comments)    Welts - cant wear them, but can be touched by someone who wears them.   Penicillins Hives and Itching    Tolerated ceftriaxone  10/2020  facial swelling    Antimicrobials this admission: Vanc 12/29> Cefepime  12/29>  Flagyl  12/29 x1  Dose adjustments this admission:   Microbiology results: 12/29 [facility]  Thank you for allowing pharmacy to be a part of this patient's care.  [name], PharmD, BCPS PGY2 Pharmacy Resident

## 2023-05-17 ENCOUNTER — Other Ambulatory Visit: Payer: Self-pay

## 2023-05-17 ENCOUNTER — Encounter (HOSPITAL_COMMUNITY): Payer: Self-pay | Admitting: Internal Medicine

## 2023-05-17 DIAGNOSIS — L03116 Cellulitis of left lower limb: Secondary | ICD-10-CM | POA: Diagnosis not present

## 2023-05-17 LAB — BASIC METABOLIC PANEL
Anion gap: 12 (ref 5–15)
BUN: 8 mg/dL (ref 8–23)
CO2: 23 mmol/L (ref 22–32)
Calcium: 8.5 mg/dL — ABNORMAL LOW (ref 8.9–10.3)
Chloride: 98 mmol/L (ref 98–111)
Creatinine, Ser: 0.79 mg/dL (ref 0.44–1.00)
GFR, Estimated: >60 mL/min (ref >60)
Glucose, Bld: 166 mg/dL — ABNORMAL HIGH (ref 70–99)
Potassium: 3.4 mmol/L — ABNORMAL LOW (ref 3.5–5.1)
Sodium: 133 mmol/L — ABNORMAL LOW (ref 135–145)

## 2023-05-17 LAB — C-REACTIVE PROTEIN: CRP: 30.6 mg/dL — ABNORMAL HIGH (ref <1.0)

## 2023-05-17 LAB — GLUCOSE, CAPILLARY
Glucose-Capillary: 168 mg/dL — ABNORMAL HIGH (ref 70–99)
Glucose-Capillary: 202 mg/dL — ABNORMAL HIGH (ref 70–99)
Glucose-Capillary: 149 mg/dL — ABNORMAL HIGH (ref 70–99)
Glucose-Capillary: 137 mg/dL — ABNORMAL HIGH (ref 70–99)

## 2023-05-17 LAB — CBC
HCT: 39.2 % (ref 36.0–46.0)
Hemoglobin: 13 g/dL (ref 12.0–15.0)
MCH: 27.8 pg (ref 26.0–34.0)
MCHC: 33.2 g/dL (ref 30.0–36.0)
MCV: 83.9 fL (ref 80.0–100.0)
Platelets: 311 10*3/uL (ref 150–400)
RBC: 4.67 MIL/uL (ref 3.87–5.11)
RDW: 13.5 % (ref 11.5–15.5)
WBC: 9.5 10*3/uL (ref 4.0–10.5)
nRBC: 0 % (ref 0.0–0.2)

## 2023-05-17 LAB — VANCOMYCIN, TROUGH: Vancomycin Tr: 12 ug/mL — ABNORMAL LOW (ref 15–20)

## 2023-05-17 LAB — CULTURE, BLOOD (ROUTINE X 2): Culture  Setup Time: NO GROWTH

## 2023-05-17 LAB — PROTIME-INR
INR: 3.3 — ABNORMAL HIGH (ref 0.8–1.2)
Prothrombin Time: 33.8 s — ABNORMAL HIGH (ref 11.4–15.2)

## 2023-05-17 LAB — SEDIMENTATION RATE: Sed Rate: 123 mm/h — ABNORMAL HIGH (ref 0–22)

## 2023-05-17 LAB — VANCOMYCIN, PEAK: Vancomycin Pk: 29 ug/mL — ABNORMAL LOW (ref 30–40)

## 2023-05-17 MED ORDER — HYDROMORPHONE HCL 2 MG PO TABS
4.0000 mg | ORAL_TABLET | ORAL | Status: DC | PRN
  Administered 2023-05-17 – 2023-05-20 (×15): 4 mg via ORAL
  Filled 2023-05-17 (×17): qty 2

## 2023-05-17 MED ORDER — SODIUM CHLORIDE 0.9 % IV SOLN
2.0000 g | Freq: Three times a day (TID) | INTRAVENOUS | Status: DC
  Administered 2023-05-17 – 2023-05-18 (×3): 2 g via INTRAVENOUS
  Filled 2023-05-17 (×3): qty 12.5

## 2023-05-17 NOTE — Consult Note (Signed)
 Reason for Consult:Left knee pain Referring Physician: Ronnald Sergeant Time called: 1123 Time at bedside: 1130   Donna Snow is an 73 y.o. female.  HPI: Tirza has been having pain and redness of her LLE for about a week. She was admitted to the hospital 4d ago for treatment. Her redness and distal lower leg pain has improved but her left knee pain has gotten much worse and orthopedic surgery was asked to consult. She had a TKA >10y ago at an outside hospital and has never had problems with it or a hx/o gout.  Past Medical History:  Diagnosis Date   Abdominal wall abscess 10/15/2020   Abdominal wall cellulitis 10/15/2020   Anxiety    Cellulitis 06/04/2021   Cellulitis of abdominal wall 02/14/2022   CHF (congestive heart failure) (HCC)    Diabetes mellitus    Hypercholesteremia    Hypertension    Obesity    Seizures (HCC)    Stroke [facility])     Past Surgical History:  Procedure Laterality Date   ABDOMINAL HYSTERECTOMY     ABDOMINAL SURGERY     CHOLECYSTECTOMY     JOINT REPLACEMENT      Family History  Adopted: Yes    Social History:  reports that she has never smoked. She has never used smokeless tobacco. She reports that she does not drink alcohol and does not use drugs.  Allergies:  Allergies  Allergen Reactions   Latex Other (See Comments)    Welts - cant wear them, but can be touched by someone who wears them.   Penicillins Hives and Itching    Tolerated ceftriaxone  10/2020  facial swelling    Medications: I have reviewed the patient's current medications.  Results for orders placed or performed during the hospital encounter of 05/13/23 (from the past 48 hours)  Glucose, capillary     Status: Abnormal   Collection Time: 05/15/23  4:46 PM  Result Value Ref Range   Glucose-Capillary 221 (H) 70 - 99 mg/dL    Comment: Glucose reference range applies only to samples taken after fasting for at least 8 hours.  Glucose, capillary     Status: Abnormal   Collection Time:  05/15/23  8:23 PM  Result Value Ref Range   Glucose-Capillary 220 (H) 70 - 99 mg/dL    Comment: Glucose reference range applies only to samples taken after fasting for at least 8 hours.  Glucose, capillary     Status: Abnormal   Collection Time: 05/16/23  5:49 AM  Result Value Ref Range   Glucose-Capillary 219 (H) 70 - 99 mg/dL    Comment: Glucose reference range applies only to samples taken after fasting for at least 8 hours.  Protime-INR     Status: Abnormal   Collection Time: 05/16/23  7:49 AM  Result Value Ref Range   Prothrombin Time 31.6 (H) 11.4 - 15.2 seconds   INR 3.0 (H) 0.8 - 1.2    Comment: (NOTE) INR goal varies based on device and disease states. Performed at [facility], [address] [place], [place] [address]   Basic metabolic panel     Status: Abnormal   Collection Time: 05/16/23  7:49 AM  Result Value Ref Range   Sodium 133 (L) 135 - 145 mmol/L   Potassium 3.6 3.5 - 5.1 mmol/L   Chloride 98 98 - 111 mmol/L   CO2 25 22 - 32 mmol/L   Glucose, Bld 215 (H) 70 - 99 mg/dL    Comment:  Glucose reference range applies only to samples taken after fasting for at least 8 hours.   BUN 9 8 - 23 mg/dL   Creatinine, Ser 9.22 0.44 - 1.00 mg/dL   Calcium  8.5 (L) 8.9 - 10.3 mg/dL   GFR, Estimated >39 >39 mL/min    Comment: (NOTE) Calculated using the CKD-EPI Creatinine Equation (2021)    Anion gap 10 5 - 15    Comment: Performed at [facility], [address] [place], [place] [address]  Glucose, capillary     Status: Abnormal   Collection Time: 05/16/23 11:35 AM  Result Value Ref Range   Glucose-Capillary 159 (H) 70 - 99 mg/dL    Comment: Glucose reference range applies only to samples taken after fasting for at least 8 hours.  CBC     Status: Abnormal   Collection Time: 05/16/23  2:24 PM  Result Value Ref Range   WBC 12.0 (H) 4.0 - 10.5 K/uL   RBC 4.55 3.87 - 5.11 MIL/uL   Hemoglobin 12.8 12.0 - 15.0 g/dL   HCT 62.0 63.9 - 53.9 %   MCV 83.3 80.0 -  100.0 fL   MCH 28.1 26.0 - 34.0 pg   MCHC 33.8 30.0 - 36.0 g/dL   RDW 86.3 88.4 - 84.4 %   Platelets 320 150 - 400 K/uL   nRBC 0.0 0.0 - 0.2 %    Comment: Performed at [facility], [address], [place], [place] [address]  Glucose, capillary     Status: Abnormal   Collection Time: 05/16/23  4:18 PM  Result Value Ref Range   Glucose-Capillary 217 (H) 70 - 99 mg/dL    Comment: Glucose reference range applies only to samples taken after fasting for at least 8 hours.  Glucose, capillary     Status: Abnormal   Collection Time: 05/16/23  8:54 PM  Result Value Ref Range   Glucose-Capillary 170 (H) 70 - 99 mg/dL    Comment: Glucose reference range applies only to samples taken after fasting for at least 8 hours.  Protime-INR     Status: Abnormal   Collection Time: 05/17/23  4:25 AM  Result Value Ref Range   Prothrombin Time 33.8 (H) 11.4 - 15.2 seconds   INR 3.3 (H) 0.8 - 1.2    Comment: (NOTE) INR goal varies based on device and disease states. Performed at [facility], [address] [place], [place] [address]   CBC     Status: None   Collection Time: 05/17/23  4:25 AM  Result Value Ref Range   WBC 9.5 4.0 - 10.5 K/uL   RBC 4.67 3.87 - 5.11 MIL/uL   Hemoglobin 13.0 12.0 - 15.0 g/dL   HCT 60.7 63.9 - 53.9 %   MCV 83.9 80.0 - 100.0 fL   MCH 27.8 26.0 - 34.0 pg   MCHC 33.2 30.0 - 36.0 g/dL   RDW 86.4 88.4 - 84.4 %   Platelets 311 150 - 400 K/uL   nRBC 0.0 0.0 - 0.2 %    Comment: Performed at [facility], [address] [place], [place] [address]  Basic metabolic panel     Status: Abnormal   Collection Time: 05/17/23  4:25 AM  Result Value Ref Range   Sodium 133 (L) 135 - 145 mmol/L   Potassium 3.4 (L) 3.5 - 5.1 mmol/L   Chloride 98 98 - 111 mmol/L   CO2 23 22 - 32 mmol/L   Glucose,  Bld 166 (H) 70 - 99 mg/dL    Comment: Glucose reference range applies only to samples taken after fasting for at least 8 hours.   BUN 8 8 - 23 mg/dL   Creatinine, Ser 9.20  0.44 - 1.00 mg/dL   Calcium  8.5 (L) 8.9 - 10.3 mg/dL   GFR, Estimated >39 >39 mL/min    Comment: (NOTE) Calculated using the CKD-EPI Creatinine Equation (2021)    Anion gap 12 5 - 15    Comment: Performed at [facility], [address] [place], [place] [address]  Glucose, capillary     Status: Abnormal   Collection Time: 05/17/23  6:36 AM  Result Value Ref Range   Glucose-Capillary 168 (H) 70 - 99 mg/dL    Comment: Glucose reference range applies only to samples taken after fasting for at least 8 hours.    CT KNEE LEFT W CONTRAST Result Date: 05/16/2023 CLINICAL DATA:  Knee replacement, infection suspected EXAM: CT OF THE LEFT KNEE WITH CONTRAST TECHNIQUE: Multidetector CT imaging was performed following the standard protocol during bolus administration of intravenous contrast. RADIATION DOSE REDUCTION: This exam was performed according to the departmental dose-optimization program which includes automated exposure control, adjustment of the mA and/or kV according to patient size and/or use of iterative reconstruction technique. CONTRAST:  75mL OMNIPAQUE  IOHEXOL  350 MG/ML SOLN COMPARISON:  X-ray 05/13/2023 FINDINGS: Bones/Joint/Cartilage Left total knee arthroplasty hardware in place. No malalignment. No periprosthetic lucency or fracture. Two fixation screws at the tibial tuberosity, also intact. No knee joint effusion. Cortical lucency along the superior margin of the tibial tuberosity just above the proximal-most screw could potentially be secondary to erosion (series 3, images 83-87). No additional site of erosion or periosteal elevation. Ligaments Suboptimally assessed by CT. Muscles and Tendons Generalized muscle atrophy. No acute musculotendinous abnormality by CT. Soft tissues Soft tissue swelling and ill-defined fluid anterior to the tibial tuberosity. No organized or drainable fluid collection. No soft tissue gas. IMPRESSION: 1. Cortical lucency along the superior margin of the  tibial tuberosity just above the proximal-most screw may reflect cortical erosion. There is overlying soft tissue swelling and ill-defined fluid anterior to this site. Cellulitis and underlying osteomyelitis is a concern. Correlate with serum inflammatory markers. 2. Left total knee arthroplasty hardware in place without evidence of hardware complication. No knee joint effusion to suggest septic arthritis. Electronically Signed   By: Mabel Converse D.O.   On: 05/16/2023 15:05    Review of Systems  Constitutional:  Negative for chills, diaphoresis and fever.  HENT:  Negative for ear discharge, ear pain, hearing loss and tinnitus.   Eyes:  Negative for photophobia and pain.  Respiratory:  Negative for cough and shortness of breath.   Cardiovascular:  Negative for chest pain.  Gastrointestinal:  Negative for abdominal pain, nausea and vomiting.  Genitourinary:  Negative for dysuria, flank pain, frequency and urgency.  Musculoskeletal:  Positive for arthralgias (Left knee). Negative for back pain, myalgias and neck pain.  Neurological:  Negative for dizziness and headaches.  Hematological:  Does not bruise/bleed easily.  Psychiatric/Behavioral:  The patient is not nervous/anxious.    Blood pressure 137/83, pulse 83, temperature (!) 97.5 F (36.4 C), temperature source Oral, resp. rate 16, height 5' 4 (1.626 m), weight (!) 143.5 kg, SpO2 96%. Physical Exam Constitutional:      General: She is not in acute distress.    Appearance: She is well-developed. She is not diaphoretic.  HENT:     Head: Normocephalic  and atraumatic.  Eyes:     General: No scleral icterus.       Right eye: No discharge.        Left eye: No discharge.     Conjunctiva/sclera: Conjunctivae normal.  Cardiovascular:     Rate and Rhythm: Normal rate and regular rhythm.  Pulmonary:     Effort: Pulmonary effort is normal. No respiratory distress.  Musculoskeletal:     Cervical back: Normal range of motion.     Comments:  LLE No traumatic wounds or ecchymosis, erythema about ankle  Severe diffuse TTP knee, severe pain with PROM  No obvious knee or ankle effusion  Sens DPN, SPN, TN intact  Motor EHL, ext, flex, evers 5/5  DP 2+, PT 2+, No significant edema  Skin:    General: Skin is warm and dry.  Neurological:     Mental Status: She is alert.  Psychiatric:        Mood and Affect: Mood normal.        Behavior: Behavior normal.     Assessment/Plan: Left knee pain -- Although exam worrisome for septic or other inflammatory arthritis normalizing WBC and lack of effusion argue against it. Agree with checking of ESR and CRP. Given body habitus and lack of effusion blind tap will almost certainly fail. Would recommend IR guided tap if clinical concern persists.    Ozell DOROTHA Ned, PA-C Orthopedic Surgery [phone] 05/17/2023, 11:40 AM

## 2023-05-17 NOTE — Progress Notes (Signed)
 PHARMACY - ANTICOAGULATION CONSULT NOTE  Pharmacy Consult for warfarin Indication: atrial fibrillation  Allergies  Allergen Reactions   Latex Other (See Comments)    Welts - cant wear them, but can be touched by someone who wears them.   Penicillins Hives and Itching    Tolerated ceftriaxone  10/2020  facial swelling   Vital Signs: Temp: 98.6 F (37 C) (01/02 0506) Temp Source: Oral (01/01 2032) BP: 135/64 (01/02 0506) Pulse Rate: 86 (01/02 0506)  Labs: Recent Labs    05/15/23 0547 05/16/23 0749 05/16/23 1424 05/17/23 0425  HGB 12.9  --  12.8 13.0  HCT 39.4  --  37.9 39.2  PLT 264  --  320 311  LABPROT 31.0* 31.6*  --  33.8*  INR 3.0* 3.0*  --  3.3*  CREATININE 0.84 0.77  --  0.79    Estimated Creatinine Clearance: 91.8 mL/min (by C-G formula based on SCr of 0.79 mg/dL).   Medical History: Past Medical History:  Diagnosis Date   Abdominal wall abscess 10/15/2020   Abdominal wall cellulitis 10/15/2020   Anxiety    Cellulitis 06/04/2021   Cellulitis of abdominal wall 02/14/2022   CHF (congestive heart failure) (HCC)    Diabetes mellitus    Hypercholesteremia    Hypertension    Obesity    Seizures (HCC)    Stroke [facility])     Assessment: Donna Snow is a 72yoF who presented from home due to cellulitis and possible UTI. Patient is on chronic warfarin for Afib and also Dilantin  for history of seizures. Pharmacy consulted to manage warfarin while inpatient. Patient took warfarin dose 12/29. Most recent Rx 05/10/23 is warfarin 5mg  daily (previous 6mg  tablet discontinued). Called daughter who said patient does her own medications. Daughter tried to review or get a list from patient but hasn't been able to yet so she does not know what medications patient takes. Per patient, 5mg  still caused INR to be high so dose was reduced to 3 point something but she's not exacty sure. Unable to see outpatient warfarin notes.  05/17/2023: INR is supratherapeutic at 3.3 today after  warfarin 2mg  on 12/30 and 12/31 and 1 mg on 1/1. Unclear what dose was taken by patient at home on 12/29. Started on broad spectrum antibiotics which can lead to increased INR. Phenytoin  resumed from home. Hgb and PLT counts stable today and no new signs of bleeding noted.  No PO intake documented per chart review for 1/1. Will hold warfarin today to try to get back into therapeutic range.   Goal of Therapy:  INR 2-3 Monitor platelets by anticoagulation protocol: Yes   Plan:  HOLD warfarin today Monitor daily INR, CBC, clinical course, s/sx of bleed, PO intake/diet, and for any new DDIs  Josefa Range, PharmD PGY1 Pharmacy Resident 05/17/2023 7:24 AM

## 2023-05-17 NOTE — Progress Notes (Signed)
 Orthopedic Note  I saw patient this evening after she was seen as a consult by Ozell Purchase. She said her left leg pain started about a week ago after getting scratched by a dog. Her leg got red and swollen. She was admitted to the hospital on 12/29 for cellulitis. She continues to have pain around her ankle and reports left knee pain. Has a history of TKA. She told me it was done 30 years ago at [facility]. On exam, she has erythema over her distal leg near the ankle. It goes around the leg circumferentially. It stops at the mid tibia. She has tenderness over the knee as well. Her midline incision appears well healed. No sinus tract or drainage seen. No erythema around the knee. Knee pain through even 20 degrees of range of motion. BP of 116/61. Afebrile. Pulse of 90. WBC has been downtrending over the course of the admission. Today's WBC was 9.5 (wnl). ESR and CRP that were obtained today were both elevated. Give her significant pain through range of motion and cellulitis on the ipsilateral leg, I recommend aspiration. Given her body habitus (BMI of 54) and the lack of effusion of CT, I have concern that it would difficult to obtain a specimen. Recommend IR aspirate of the left knee. Will follow the results to determine next steps in management. Okay for diet and dvt ppx. WBAT LLE.  Ozell DELENA Ada, MD Orthopedic Surgeon

## 2023-05-17 NOTE — Plan of Care (Signed)
  Problem: Education: Goal: Knowledge of General Education information will improve Description: Including pain rating scale, medication(s)/side effects and non-pharmacologic comfort measures Outcome: Progressing   Problem: Health Behavior/Discharge Planning: Goal: Ability to manage health-related needs will improve Outcome: Progressing   Problem: Coping: Goal: Level of anxiety will decrease Outcome: Progressing   Problem: Elimination: Goal: Will not experience complications related to bowel motility Outcome: Progressing Goal: Will not experience complications related to urinary retention Outcome: Progressing   Problem: Pain Management: Goal: General experience of comfort will improve Outcome: Progressing   Problem: Safety: Goal: Ability to remain free from injury will improve Outcome: Progressing   Problem: Skin Integrity: Goal: Risk for impaired skin integrity will decrease Outcome: Progressing

## 2023-05-17 NOTE — Care Management Important Message (Signed)
 Important Message  Patient Details  Name: Donna Snow MRN: [id number] Date of Birth: [birth year]   Important Message Given:  Yes - Medicare IM     Dorena Bodo 05/17/2023, 3:44 PM

## 2023-05-17 NOTE — Progress Notes (Addendum)
 HD#3 SUBJECTIVE:  Patient Summary: Donna Snow is a 73 y.o. female with pertinent PMH of 2 diabetes on insulin  who presented with left leg pain and is admitted for cellulitis.   Overnight Events: None  Interim History: She continues to have pain in the left leg.  She says that the pain of her lower leg has gotten better though remains.  She says that the pain in her left knee is worse.  She also feels that the swelling is worse.  This pain prevents her from feeling comfortable getting out of bed.  She also denies bowel movements, noting that she has been eating less.  OBJECTIVE:  Vital Signs: Vitals:   05/16/23 0801 05/16/23 1621 05/16/23 2032 05/17/23 0506  BP: (!) 145/64 130/70 (!) 147/52 135/64  Pulse: 89 92 93 86  Resp: 18  18 20   Temp: 98.3 F (36.8 C) 98.4 F (36.9 C) 98.2 F (36.8 C) 98.6 F (37 C)  TempSrc: Oral Oral Oral   SpO2: 95% 95% 97% 98%  Weight:      Height:       Supplemental O2: Nasal Cannula SpO2: 98 % O2 Flow Rate (L/min): 2 L/min  Filed Weights   05/15/23 1011  Weight: (!) 143.5 kg     Intake/Output Summary (Last 24 hours) at 05/17/2023 9365 Last data filed at 05/17/2023 0146 Gross per 24 hour  Intake 2199.14 ml  Output 825 ml  Net 1374.14 ml   Net IO Since Admission: -1,325.86 mL [05/17/23 0634]  Physical Exam: Physical Exam Constitutional:      General: She is not in acute distress.    Appearance: She is obese. She is ill-appearing.  HENT:     Nose: Nose normal.  Cardiovascular:     Rate and Rhythm: Normal rate.     Pulses: Normal pulses.  Pulmonary:     Effort: Pulmonary effort is normal.     Breath sounds: No wheezing or rales.  Abdominal:     General: Abdomen is flat. There is no distension.     Tenderness: There is no abdominal tenderness.  Musculoskeletal:        General: Swelling and tenderness present.     Right lower leg: No edema.     Left lower leg: Edema present.     Comments: [name]. Locus of infection at  lower leg appears to be improving - less redness and swelling. However there is increased swelling around the L knee with some erythema radiating upward. There is a previous surgical scar on the L lateral leg that is newly erythematous and is tender. The L knee is acutely warm and there is clear demarcation with more appropriate skin temp on the upper leg.  Skin:    General: Skin is warm and dry.     Findings: Rash present.  Neurological:     General: No focal deficit present.     Mental Status: She is alert.  Psychiatric:        Mood and Affect: Mood normal.        Behavior: Behavior normal.   Patient Lines/Drains/Airways Status     Active Line/Drains/Airways     Name Placement date Placement time Site Days   Peripheral IV 05/14/23 20 G 1.88 Left;Posterior Forearm 05/14/23  2333  Forearm  3             ASSESSMENT/PLAN:  Assessment: Principal Problem:   Cellulitis of left lower extremity from knee to ankle Active Problems:  Hyperglycemia due to diabetes mellitus (HCC)   Type 2 diabetes mellitus (HCC)   AF (paroxysmal atrial fibrillation) (HCC)   Chronic anticoagulation - on coumadin  for afib   OSA on CPAP   GAD (generalized anxiety disorder)   Sepsis without acute organ dysfunction (HCC)   Chronic constipation  Donna Snow is a 73 y.o. female with pertinent PMH of T2 diabetes on insulin  who presented with left leg pain and is admitted for cellulitis.    Plan: Cellulitis of left lower extremity Sepsis Rule out due to LLE cellulitis (resolved) Concern for septic Joint vs. Incisional abscess with hx of L knee replacement Ongoing x 2 days, the L knee has become more swollen and painful, focus at previous surgical scar, despite improvement in the lower leg cellulitis. CT yesterday did not suggest effusion. White count has resolved. In light of clinical exam change despite improvement in cellulitis and exquisite tenderness, will ask ortho to see, thank you. - Infection  focus on L leg appears to improve, but remains still tender and red. Erythema is getting lighter. Stable dermal edema in circumferential lower leg. L leg remains hot, and swelling and tenderness extend to knee and distal medial thigh.  - Continue IV antibiotics and pain control. - Vancomycin , cefepime  IV now day 5 - For pain Tylenol  1000 every 8 hours, Dilaudid  4 mg every 4h as needed.  - Trend white count and blood cultures: 1/4 with GPCinCs otherwise no growth 3 days (likely contaminant)   T2DM AGMA - possible mild DKA (resolved) Mild AG resolved with insulin . Most recent A1c is 7.4%. Takes Humulin R  11-20 units TID before meals, typically 300 units daily at home. She has some stomach upset but it is ongoing for several days and likely related to her cellulitis. 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  Often needs fleet enemas in order to have BM. Has bedside commode. - Senna-docusate 1 tablet oral BID, Miralax  BID, Fleet enema prn     Dyspnea associated with Anxiety and OSA Intermittent dyspnea but no hypoxia. CPAP provided at that time and resolved the issue. Upon dyspnea, attempt reassurance about overall plan, will evaluate need for anxiolytics, oxygen available for comfort. - Continue home Buspar  20mg  TID. Atarax  50mg    Ambulatory dysfunction due to severe pain Some difficulty with walking, getting to toilet. RN assisting. PT consult.   Paroxysmal Afib Rate/Rhythm controlled without medicine. She takes warfarin outpatient. Will continue here per pharmacy.   Chronic HFpEF without exacerbation - Resume lasix  60 every day (home dose is BID). Hold spironolactone  12.5mg  every day, losartan  50mg  every day in setting of low normal Bps. Check daily weights.   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  Will continue topiramate   200mg  po BID, Phenytoin  400mg  po qd. HLD - Continue home rosuvastatin  40mg  every day and zetia  10mg  every day  GERD - Continue home Protonix  40mg  qd Night Terrors - Home prazosin  1mg  am and 2mg  at bedtime Insomnia - Trazodone  200 at bedtime, home dose is 300mg  HTN - Holidng home medicines per above   Best Practice: Diet: Regular diet IVF: none VTE: warfarin Code: Full AB: Vancomycin  and Cefepime  Therapy Recs: PT to see DISPO: Anticipated discharge in 2-3 days to Home pending  pain/infection control .  Signature: Lonni Africa, D.O.  Internal Medicine Resident, PGY-1 [facility]  Pager: [phone] 6:34 AM, 05/17/2023   Please contact the on call pager after 5 pm and on weekends at [phone].

## 2023-05-17 NOTE — Evaluation (Addendum)
 Physical Therapy Evaluation  Patient Details Name: Donna Snow MRN: [id number] DOB: 1950-12-22 Today's Date: 05/17/2023  History of Present Illness  Pt is a 73 y/o female who presents 05/13/2023 with LLE cellulitis. PMH significant for Abdominal wall abscess and cellulitis, anxiety, CHF, DM, HTN, obesity, seizures, stroke, TKA.  Clinical Impression  Pt admitted with above diagnosis. Pt currently with functional limitations due to the deficits listed below (see PT Problem List). At the time of PT eval pt was able to perform transfers with up to +2 mod assist and RW for support. Pain was the limiting factor. Pt positioned up in recliner chair with leg rests elevated, pillow supporting knee joint, and ice packs surrounding knee. Anticipate pt will progress well as pain improves. If does not improve next session however may want to consider a higher level of care at d/c. Pt will benefit from acute skilled PT to increase their independence and safety with mobility to allow discharge.           If plan is discharge home, recommend the following: Two people to help with walking and/or transfers;A lot of help with bathing/dressing/bathroom;Assistance with cooking/housework;Assist for transportation;Help with stairs or ramp for entrance   Can travel by private vehicle        Equipment Recommendations BSC/3in1  Recommendations for Other Services       Functional Status Assessment Patient has had a recent decline in their functional status and demonstrates the ability to make significant improvements in function in a reasonable and predictable amount of time.     Precautions / Restrictions Precautions Precautions: Fall Restrictions Weight Bearing Restrictions Per Provider Order: No      Mobility  Bed Mobility Overal bed mobility: Needs Assistance Bed Mobility: Supine to Sit     Supine to sit: Mod assist, +2 for physical assistance, +2 for safety/equipment, Used rails, HOB elevated      General bed mobility comments: HOB almost fully elevated. Heavy assist and cues for transition to EOB. Bed pad utilized to assist with scooting.    Transfers Overall transfer level: Needs assistance Equipment used: Rolling walker (2 wheels) Transfers: Sit to/from Stand, Bed to chair/wheelchair/BSC Sit to Stand: Mod assist, +2 physical assistance, +2 safety/equipment, From elevated surface Stand pivot transfers: Min assist, +2 physical assistance, +2 safety/equipment, From elevated surface         General transfer comment: VC's for hand placement on seated surface for safety. Assist for power up to full stand, walker management during pivot, and balance support. PT guided hips around to chair.    Ambulation/Gait               General Gait Details: Unable  Stairs            Wheelchair Mobility     Tilt Bed    Modified Rankin (Stroke Patients Only)       Balance Overall balance assessment: Needs assistance Sitting-balance support: Feet supported, No upper extremity supported Sitting balance-Leahy Scale: Fair     Standing balance support: Bilateral upper extremity supported, During functional activity, Reliant on assistive device for balance Standing balance-Leahy Scale: Poor                               Pertinent Vitals/Pain Pain Assessment Pain Assessment: 0-10 Pain Score: 10-Worst pain ever Pain Location: L lower leg around the knee. Pain Descriptors / Indicators: Sharp, Grimacing, Guarding, Throbbing Pain Intervention(s): Limited activity within patient's  tolerance, Monitored during session, Repositioned, Ice applied    Home Living Family/patient expects to be discharged to:: Private residence Living Arrangements: Children Available Help at Discharge: Family;Available PRN/intermittently Type of Home: Mobile home Home Access: Ramped entrance       Home Layout: One level Home Equipment: Wheelchair - power;Grab bars - toilet;Grab  bars - tub/shower;Cane - single Librarian, Academic (2 wheels);Shower seat      Prior Function Prior Level of Function : Independent/Modified Independent;History of Falls (last six months)             Mobility Comments: Use the cane more than the walker. Does not drive anymore, has had falls. ADLs Comments: Pt reports independence     Extremity/Trunk Assessment   Upper Extremity Assessment Upper Extremity Assessment: Generalized weakness    Lower Extremity Assessment Lower Extremity Assessment: Generalized weakness;LLE deficits/detail LLE Deficits / Details: Acute pain, decreased strength and AROM consistent with above mentioned cellulitis. LLE: Unable to fully assess due to pain    Cervical / Trunk Assessment Cervical / Trunk Assessment: Other exceptions Cervical / Trunk Exceptions: Forward head posture with rounded shoulders  Communication   Communication Communication: No apparent difficulties Cueing Techniques: Verbal cues;Gestural cues  Cognition Arousal: Alert Behavior During Therapy: Anxious Overall Cognitive Status: Within Functional Limits for tasks assessed                                          General Comments      Exercises General Exercises - Lower Extremity Ankle Circles/Pumps: 10 reps   Assessment/Plan    PT Assessment Patient needs continued PT services  PT Problem List Decreased strength;Decreased activity tolerance;Decreased range of motion;Decreased balance;Decreased mobility;Decreased knowledge of use of DME;Decreased safety awareness;Decreased knowledge of precautions;Pain;Obesity       PT Treatment Interventions DME instruction;Gait training;Functional mobility training;Therapeutic activities;Therapeutic exercise;Balance training;Patient/family education    PT Goals (Current goals can be found in the Care Plan section)  Acute Rehab PT Goals Patient Stated Goal: Decrease pain, go home PT Goal Formulation: With  patient Time For Goal Achievement: 05/24/23 Potential to Achieve Goals: Good    Frequency Min 1X/week     Co-evaluation               AM-PAC PT 6 Clicks Mobility  Outcome Measure Help needed turning from your back to your side while in a flat bed without using bedrails?: Total Help needed moving from lying on your back to sitting on the side of a flat bed without using bedrails?: Total Help needed moving to and from a bed to a chair (including a wheelchair)?: Total Help needed standing up from a chair using your arms (e.g., wheelchair or bedside chair)?: Total Help needed to walk in hospital room?: Total Help needed climbing 3-5 steps with a railing? : Total 6 Click Score: 6    End of Session Equipment Utilized During Treatment: Gait belt Activity Tolerance: Patient limited by pain Patient left: in chair;with call bell/phone within reach;with chair alarm set Nurse Communication: Mobility status;Need for lift equipment PT Visit Diagnosis: Unsteadiness on feet (R26.81);Pain Pain - Right/Left: Left Pain - part of body: Knee;Leg    Time: 1410-1447 PT Time Calculation (min) (ACUTE ONLY): 37 min   Charges:   PT Evaluation $PT Eval Moderate Complexity: 1 Mod PT Treatments $Gait Training: 8-22 mins PT General Charges $$ ACUTE PT VISIT: 1 Visit  Leita Sable, PT, DPT Acute Rehabilitation Services Secure Chat Preferred Office: [phone]   [name] 05/17/2023, 3:48 PM

## 2023-05-17 NOTE — Progress Notes (Signed)
 Pharmacy Antibiotic Note  Donna Snow is a 73 y.o. female admitted on 05/13/2023 with cellulitis.  Pharmacy has been consulted for cefepime  and vancomycin  dosing.  Patient has a history of T2DM and presented with left leg pain and swelling from a dog scratch. Her blood cultures are now positive for gram positive cocci in clusters, pending speciation.   CrCl has improved significantly since initial dosing on 12/29. Scr on 12/29 was 1.23, Scr with most recent labs is 0.84. Her weight has also been updated. Dose of vancomycin  and cefepime  still appropriate. Will opt to get levels as she is likely at steady state.   Steady-state level assessment: 750 mg given 1/2 10:46 P 29 1/2 12:06 T 12 1/2 21:05 Ke 0.0982 hr-1 cAUC 443.5 mcg*hr/mL  Plan: Change Cefepime  2g IV q8h for improved RF Continue Vancomycin  750mg  q12 hours F/u cultures, LOT, renal func Follow up vancomycin  levels, first measurement scheduled for 1/2 AM   Height: 5' 4 (162.6 cm) (per PT) Weight: (!) 143.5 kg (316 lb 5.8 oz) IBW/kg (Calculated) : 54.7  Temp (24hrs), Avg:98.5 F (36.9 C), Min:97.5 F (36.4 C), Max:99.1 F (37.3 C)  Recent Labs  Lab 05/13/23 0900 05/13/23 1010 05/14/23 0533 05/14/23 1526 05/15/23 0547 05/16/23 0749 05/16/23 1424 05/17/23 0425 05/17/23 1206 05/17/23 2105  WBC 13.6*  --  11.8*  --  10.6*  --  12.0* 9.5  --   --   CREATININE 1.23*  --  0.93 0.83 0.84 0.77  --  0.79  --   --   LATICACIDVEN  --  1.9  --   --   --   --   --   --   --   --   VANCOTROUGH  --   --   --   --   --   --   --   --   --  12*  VANCOPEAK  --   --   --   --   --   --   --   --  29*  --     Estimated Creatinine Clearance: 91.8 mL/min (by C-G formula based on SCr of 0.79 mg/dL).    Allergies  Allergen Reactions   Latex Other (See Comments)    Welts - cant wear them, but can be touched by someone who wears them.   Penicillins Hives and Itching    Tolerated ceftriaxone  10/2020  facial swelling     Antimicrobials this admission: Vanc 12/29> Cefepime  12/29>  Flagyl  12/29 x1  Dose adjustments this admission:   Microbiology results: 12/29 [facility]  Thank you for allowing pharmacy to be a part of this patient's care.   Benedetta Heath BS, PharmD, BCPS Clinical Pharmacist 05/17/2023 9:51 PM  Contact: [phone] after 3 PM  Be curious, not judgmental... -Davina Sprinkles

## 2023-05-18 DIAGNOSIS — G4733 Obstructive sleep apnea (adult) (pediatric): Secondary | ICD-10-CM

## 2023-05-18 DIAGNOSIS — F411 Generalized anxiety disorder: Secondary | ICD-10-CM

## 2023-05-18 DIAGNOSIS — A419 Sepsis, unspecified organism: Secondary | ICD-10-CM | POA: Diagnosis not present

## 2023-05-18 DIAGNOSIS — W548XXA Other contact with dog, initial encounter: Secondary | ICD-10-CM

## 2023-05-18 DIAGNOSIS — L03116 Cellulitis of left lower limb: Secondary | ICD-10-CM | POA: Diagnosis not present

## 2023-05-18 DIAGNOSIS — I480 Paroxysmal atrial fibrillation: Secondary | ICD-10-CM

## 2023-05-18 LAB — BASIC METABOLIC PANEL
Anion gap: 11 (ref 5–15)
BUN: 9 mg/dL (ref 8–23)
CO2: 26 mmol/L (ref 22–32)
Calcium: 8.5 mg/dL — ABNORMAL LOW (ref 8.9–10.3)
Chloride: 98 mmol/L (ref 98–111)
Creatinine, Ser: 0.7 mg/dL (ref 0.44–1.00)
GFR, Estimated: >60 mL/min (ref >60)
Glucose, Bld: 165 mg/dL — ABNORMAL HIGH (ref 70–99)
Potassium: 3.5 mmol/L (ref 3.5–5.1)
Sodium: 135 mmol/L (ref 135–145)

## 2023-05-18 LAB — CULTURE, BLOOD (ROUTINE X 2)
Culture: NO GROWTH
Special Requests: ADEQUATE

## 2023-05-18 LAB — SYNOVIAL CELL COUNT + DIFF, W/ CRYSTALS
Eosinophils-Synovial: 0 % (ref 0–1)
Lymphocytes-Synovial Fld: 3 % (ref 0–20)
Monocyte-Macrophage-Synovial Fluid: 4 % — ABNORMAL LOW (ref 50–90)
Neutrophil, Synovial: 93 % — ABNORMAL HIGH (ref 0–25)
WBC, Synovial: 2550 /mm3 — ABNORMAL HIGH (ref 0–200)

## 2023-05-18 LAB — CBC
HCT: 39.1 % (ref 36.0–46.0)
Hemoglobin: 13 g/dL (ref 12.0–15.0)
MCH: 28.3 pg (ref 26.0–34.0)
MCHC: 33.2 g/dL (ref 30.0–36.0)
MCV: 85 fL (ref 80.0–100.0)
Platelets: 387 10*3/uL (ref 150–400)
RBC: 4.6 MIL/uL (ref 3.87–5.11)
RDW: 13.5 % (ref 11.5–15.5)
WBC: 10.2 10*3/uL (ref 4.0–10.5)
nRBC: 0 % (ref 0.0–0.2)

## 2023-05-18 LAB — PROTIME-INR
INR: 3.3 — ABNORMAL HIGH (ref 0.8–1.2)
Prothrombin Time: 33.7 s — ABNORMAL HIGH (ref 11.4–15.2)

## 2023-05-18 LAB — GLUCOSE, CAPILLARY
Glucose-Capillary: 149 mg/dL — ABNORMAL HIGH (ref 70–99)
Glucose-Capillary: 198 mg/dL — ABNORMAL HIGH (ref 70–99)
Glucose-Capillary: 208 mg/dL — ABNORMAL HIGH (ref 70–99)
Glucose-Capillary: 148 mg/dL — ABNORMAL HIGH (ref 70–99)

## 2023-05-18 MED ORDER — BUPIVACAINE HCL (PF) 0.5 % IJ SOLN
10.0000 mL | Freq: Once | INTRAMUSCULAR | Status: AC
  Administered 2023-05-18: 10 mL
  Filled 2023-05-18: qty 10

## 2023-05-18 MED ORDER — SODIUM CHLORIDE 0.9 % IV SOLN
2.0000 g | INTRAVENOUS | Status: DC
Start: 2023-05-18 — End: 2023-05-20
  Administered 2023-05-18 – 2023-05-19 (×2): 2 g via INTRAVENOUS
  Filled 2023-05-18 (×2): qty 20

## 2023-05-18 MED ORDER — METHYLPREDNISOLONE ACETATE 40 MG/ML IJ SUSP
40.0000 mg | Freq: Once | INTRAMUSCULAR | Status: AC
  Administered 2023-05-18: 40 mg via INTRA_ARTICULAR
  Filled 2023-05-18: qty 1

## 2023-05-18 MED ORDER — BISACODYL 10 MG RE SUPP
10.0000 mg | Freq: Once | RECTAL | Status: AC
  Administered 2023-05-18: 10 mg via RECTAL
  Filled 2023-05-18: qty 1

## 2023-05-18 MED ORDER — VANCOMYCIN HCL 1000 MG IV SOLR
750.0000 mg | Freq: Two times a day (BID) | INTRAVENOUS | Status: DC
  Administered 2023-05-18 – 2023-05-19 (×2): 750 mg via INTRAVENOUS
  Filled 2023-05-18 (×3): qty 15

## 2023-05-18 MED ORDER — VANCOMYCIN HCL 750 MG/150ML IV SOLN
750.0000 mg | Freq: Two times a day (BID) | INTRAVENOUS | Status: DC
  Filled 2023-05-18: qty 150

## 2023-05-18 NOTE — Progress Notes (Signed)
 PT Cancellation Note  Patient Details Name: Donna Snow MRN: [id number] DOB: 10-25-1950   Cancelled Treatment:    Reason Eval/Treat Not Completed: Medical issues which prohibited therapy. Discussed pt case with Ozell Ned, PA-C after knee aspiration. He recommends holding PT treatment session at this time. Will check back on Monday for updated plan for the knee (OR vs non-op management), and assess readiness to participate.    Meryn Sarracino D Albie Bazin 05/18/2023, 3:00 PM  Leita Sable, PT, DPT Acute Rehabilitation Services Secure Chat Preferred Office: [phone]

## 2023-05-18 NOTE — Progress Notes (Signed)
 HD#4 SUBJECTIVE:  Patient Summary: Donna Snow is a 73 y.o. female with pertinent PMH of 2 diabetes on insulin  who presented with left leg pain and is admitted for cellulitis.   Overnight Events: None  Interim History: Continues to have pain in the leg. Lower L leg pain is approximately stable. L knee pain is worse. Swelling is worse. There is new pain on the R lower leg as well.  OBJECTIVE:  Vital Signs: Vitals:   05/17/23 2015 05/18/23 0409 05/18/23 0500 05/18/23 0837  BP: (!) 138/41 139/76  (!) 121/98  Pulse: 88 65  97  Resp: 18 18  16   Temp: 98.8 F (37.1 C) 98.7 F (37.1 C)  98.1 F (36.7 C)  TempSrc: Oral Oral  Oral  SpO2: 100% 97%  97%  Weight:   (!) 142.8 kg   Height:       Supplemental O2: Room Air SpO2: 97 % O2 Flow Rate (L/min): 2 L/min  Filed Weights   05/15/23 1011 05/18/23 0500  Weight: (!) 143.5 kg (!) 142.8 kg     Intake/Output Summary (Last 24 hours) at 05/18/2023 0915 Last data filed at 05/18/2023 0500 Gross per 24 hour  Intake --  Output 1325 ml  Net -1325 ml   Net IO Since Admission: -2,284.86 mL [05/18/23 0915]  Physical Exam: Physical Exam Constitutional:      General: She is not in acute distress.    Appearance: She is obese. She is ill-appearing.  HENT:     Nose: Nose normal.  Cardiovascular:     Rate and Rhythm: Normal rate.     Pulses: Normal pulses.  Pulmonary:     Effort: Pulmonary effort is normal.     Breath sounds: No wheezing or rales.  Abdominal:     General: Abdomen is flat. There is no distension.     Tenderness: There is no abdominal tenderness.  Musculoskeletal:        General: Swelling and tenderness present.     Right lower leg: No edema.     Left lower leg: Edema present.     Comments: [name]. Locus of infection at lower leg continues to improve - less redness and swelling. L knee swelling increases, more upward radiating redness and redness surgical scar. Exquisitely tender. There is now pain at the R  lower leg. There is new mild erythema below the knee and there is new warmth in the lower R leg demarcated approximately at R knee. Skin:    General: Skin is warm and dry.     Findings: Rash present.  Neurological:     General: No focal deficit present.     Mental Status: She is alert.  Psychiatric:        Mood and Affect: Mood normal.        Behavior: Behavior normal.   Patient Lines/Drains/Airways Status     Active Line/Drains/Airways     Name Placement date Placement time Site Days   Peripheral IV 05/14/23 20 G 1.88 Left;Posterior Forearm 05/14/23  2333  Forearm  4             ASSESSMENT/PLAN:  Assessment: Principal Problem:   Cellulitis of left lower extremity from knee to ankle Active Problems:   Hyperglycemia due to diabetes mellitus (HCC)   Type 2 diabetes mellitus (HCC)   AF (paroxysmal atrial fibrillation) (HCC)   Chronic anticoagulation - on coumadin  for afib   OSA on CPAP   GAD (generalized anxiety disorder)  Sepsis without acute organ dysfunction (HCC)   Chronic constipation  Donna Snow is a 73 y.o. female with pertinent PMH of T2 diabetes on insulin  who presented with left leg pain and is admitted for cellulitis.    Plan: Cellulitis of left lower extremity Sepsis Rule out due to LLE cellulitis (resolved) Concern for septic Joint vs. Incisional abscess with hx of L knee replacement Ongoing x 3 days, the L knee has become more swollen and painful with focus at previous surgical scar despite improvement in the lower leg cellulitis. CT 1/1 did not suggest effusion. White count has resolved. Afebrile. Despite this, clinical condition worsens. Now there is pain on the R leg and new warmth/erythema in the RLE. In light of worsening exam on the R leg, will call ID. - Ortho eval yesterday: mixed concern for septic joint/abscess but due to abnormal exam rec aspiration, needed by IR given lack of fluid collection, IR consulted, thank you. - Infection focus on L  leg appears to improve, but remains still tender and red. Erythema is getting lighter. Stable dermal edema in circumferential lower leg. L leg remains hot, and swelling and tenderness extend to knee and distal medial thigh.  - Continue IV antibiotics and pain control. - Vancomycin , cefepime  IV now day 6 - For pain Tylenol  1000 every 8 hours, Dilaudid  4 mg every 4h as needed. Bowel regimen: Miralax  BID, Senna-docusate BID, fleet enema prn. - Trend white count and blood cultures: 1/4 with GPCinCs otherwise no growth 3 days (possible contaminant)   T2DM AGMA - possible mild DKA (resolved) Mild AG resolved with insulin . Most recent A1c is 7.4%. Takes Humulin R  11-20 units TID before meals, typically 300 units daily at home. She has some stomach upset but it is ongoing for several days and likely related to her cellulitis. 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  Often needs fleet enemas in order to have BM. Has bedside commode. - Senna-docusate 1 tablet oral BID, Miralax  BID, Fleet enema prn     Dyspnea associated with Anxiety and OSA Intermittent dyspnea but no hypoxia. CPAP provided at that time and resolved the issue. Upon dyspnea, attempt reassurance about overall plan, will evaluate need for anxiolytics, oxygen available for comfort. - Continue home Buspar  20mg  TID. Atarax  50mg    Ambulatory dysfunction due to severe pain Some difficulty with walking, getting to toilet. RN assisting. PT consult.   Paroxysmal Afib Rate/Rhythm controlled without medicine. She takes warfarin outpatient. Will continue here per pharmacy.   Chronic HFpEF without exacerbation - Resume lasix  60 every day (home dose is BID). Hold spironolactone  12.5mg  every day, losartan  50mg  every day in setting of low normal Bps. Check daily weights.   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  Will continue topiramate  200mg  po BID, Phenytoin  400mg  po qd. HLD - Continue home rosuvastatin  40mg  every day and zetia  10mg  every day  GERD - Continue home Protonix  40mg  qd Night Terrors - Home prazosin  1mg  am and 2mg  at bedtime Insomnia - Trazodone  200 at bedtime, home dose is 300mg  HTN - Holdng home medicines per above   Best Practice: Diet: Regular diet IVF: none VTE: warfarin Code: Full AB: Vancomycin  and Cefepime  Therapy Recs: PT to see DISPO: Anticipated discharge in 2-3 days to Home pending  pain/infection control .  Signature: Lonni Africa, D.O.  Internal Medicine Resident,  PGY-1 [facility]  Pager: [phone] 9:15 AM, 05/18/2023   Please contact the on call pager after 5 pm and on weekends at [phone].

## 2023-05-18 NOTE — Progress Notes (Signed)
 PHARMACY - ANTICOAGULATION CONSULT NOTE  Pharmacy Consult for warfarin Indication: atrial fibrillation  Allergies  Allergen Reactions   Latex Other (See Comments)    Welts - cant wear them, but can be touched by someone who wears them.   Penicillins Hives and Itching    Tolerated ceftriaxone  10/2020  facial swelling   Vital Signs: Temp: 98.7 F (37.1 C) (01/03 0409) Temp Source: Oral (01/03 0409) BP: 139/76 (01/03 0409) Pulse Rate: 65 (01/03 0409)  Labs: Recent Labs    05/16/23 0749 05/16/23 1424 05/16/23 1424 05/17/23 0425 05/18/23 0745  HGB  --  12.8   < > 13.0 13.0  HCT  --  37.9  --  39.2 39.1  PLT  --  320  --  311 387  LABPROT 31.6*  --   --  33.8* 33.7*  INR 3.0*  --   --  3.3* 3.3*  CREATININE 0.77  --   --  0.79  --    < > = values in this interval not displayed.    Estimated Creatinine Clearance: 91.5 mL/min (by C-G formula based on SCr of 0.79 mg/dL).   Medical History: Past Medical History:  Diagnosis Date   Abdominal wall abscess 10/15/2020   Abdominal wall cellulitis 10/15/2020   Anxiety    Cellulitis 06/04/2021   Cellulitis of abdominal wall 02/14/2022   CHF (congestive heart failure) (HCC)    Diabetes mellitus    Hypercholesteremia    Hypertension    Obesity    Seizures (HCC)    Stroke [facility])     Assessment: Aunisty Reali is a 72yoF who presented from home due to cellulitis and possible UTI. Patient is on chronic warfarin for Afib and also Dilantin  for history of seizures. Pharmacy consulted to manage warfarin while inpatient. Patient took warfarin dose 12/29. Most recent Rx 05/10/23 is warfarin 5mg  daily (previous 6mg  tablet discontinued). Called daughter who said patient does her own medications. Daughter tried to review or get a list from patient but hasn't been able to yet so she does not know what medications patient takes. Per patient, 5mg  still caused INR to be high so dose was reduced to 3 point something but she's not exacty sure.  Unable to see outpatient warfarin notes.  05/18/2023: INR is supratherapeutic at 3.3 today. Remains on broad spectrum antibiotics which can lead to increased INR. Phenytoin  resumed from home. Hgb and PLT counts stable today and no new signs of bleeding noted.  No PO intake documented per chart review. Will hold warfarin today to try to get back into therapeutic range.   Goal of Therapy:  INR 2-3 Monitor platelets by anticoagulation protocol: Yes   Plan:  HOLD warfarin today Monitor daily INR, CBC, clinical course, s/sx of bleed, PO intake/diet, and for any new DDIs  Josefa Range, PharmD PGY1 Pharmacy Resident 05/18/2023 8:26 AM

## 2023-05-18 NOTE — Consult Note (Addendum)
 Date of Admission:  05/13/2023          Reason for Consult: Cellulitis, concern for septic knee    Referring Provider: Mliss Pouch, MD   Assessment:  Cellulitis after dog scratch with perhaps subtle lymphangitic spread Kocuria Rhizophila in 1/2 blood cultures which could be a contaminant and could also be a pathogen (more commonly pathogenic in the immunocompromised individuals) Pseudogout PCN allergy --> rash as a child She will fibrillation on Coumadin  ? Why not newer agent Morbid obesity Obstructive sleep apnea  Plan:  I had stopped the vancomycin  but am reinstituting it since it has the most reliable activity against the Kocuria --IF it is a pathogen We will see if S can be checked by reference lab--though it being Friday night this is not optimal timing I have DC cefepime  in  exchange for ceftriaxone  which would be a reliably active antibiotic against Pasteurella canis as well as strep species Repeat blood cultures ELEVATE LEG ABOVE HEART Treat pseudogout Like to have her undergo an amoxicillin  challenge on Monday to see if she truly is allergic to penicillin still or whether they can be used to treat infections in her now Screen for HIV, viral hepatides  I will check in on her at least once this weekend. I would prefer to come up with more final antibiotic plans on Monday.   Principal Problem:   Cellulitis of left lower extremity from knee to ankle Active Problems:   Hyperglycemia due to diabetes mellitus (HCC)   Type 2 diabetes mellitus (HCC)   AF (paroxysmal atrial fibrillation) (HCC)   Chronic anticoagulation - on coumadin  for afib   OSA on CPAP   GAD (generalized anxiety disorder)   Sepsis without acute organ dysfunction (HCC)   Chronic constipation   Scheduled Meds:  acetaminophen   1,000 mg Oral Q6H   busPIRone   20 mg Oral TID   ezetimibe   10 mg Oral Daily   FLUoxetine   60 mg Oral Daily   furosemide   60 mg Oral Daily   insulin  aspart  0-20  Units Subcutaneous TID WC   insulin  aspart  0-5 Units Subcutaneous QHS   insulin  glargine-yfgn  24 Units Subcutaneous BID   nystatin    Topical BID   pantoprazole   40 mg Oral BID   phenytoin   400 mg Oral Daily   polyethylene glycol  17 g Oral BID   prazosin   1 mg Oral q AM   And   prazosin   2 mg Oral QHS   rosuvastatin   40 mg Oral Daily   senna-docusate  2 tablet Oral Daily   topiramate   200 mg Oral BID   Warfarin - Pharmacist Dosing Inpatient   Does not apply q1600   Continuous Infusions:  cefTRIAXone  (ROCEPHIN )  IV 2 g (05/18/23 1636)   PRN Meds:.HYDROmorphone , hydrOXYzine , ondansetron  (ZOFRAN ) IV, sodium chloride , trazodone   HPI: Donna Snow is a 73 y.o. female with past medical history significant for morbid obesity, struct of sleep apnea, atrial fibrillation asthma, lipidemia prior stroke seizures depression who had sustained a scratch to her lower leg from a dog.  Was there and she developed painful erythema proximal to that scratch which had worsened into 2 to 3 days prior to admission to [facility].  She is exquisitely tender over this area and was concerned that the infection was spreading and that she might have blood poisoning.  She was admitted to the teaching service and blood cultures were taken one of  which is now growing   KOCURIA RHIZOPHILA     In one ofher admission blood cultures (aerobic bottle only).  Her blood cultures were taken and she was started on much broader spectrum antibiotics than I would have anticipated with vancomycin  cefepime  and metronidazole  though perhaps she had sufficient septic physiology to warrant sepsis antibiotics I would have focused on typical culprits for cellulitis after dog scratch.  She continues to have erythema and exquisite tenderness in the cellulitic area.  In the interim she also has developed worsening of her chronic knee pain bilaterally.  She is now status post aspiration of the left knee is revealed 2550  white blood cells 93% neutrophils and intracellular serum pyrophosphate crystals.  I had initially stopped her vancomycin  but then discovered her positive culture for   KOCURIA RHIZOPHILA   And I re instituted vancomycin .  This organism can be a contaminant but it can be a true pathogen, more commonly in immunocompromised patients including those on highly active chemotherapy.  Standardized susceptibility testing for the organism are not available but vancomycin  appears to be the most active in the literature I was able to research.  I have gotten rid of her cefepime  metronidazole  and exchange for ceftriaxone  which will have a nice activity and streptococcal species as well as Pasteurella.  She should elevate the leg  Hopefully she will continue to do well and we can ultimately send her home on oral antibiotics.  I have personally spent 86 minutes involved in face-to-face and non-face-to-face activities for this patient on the day of the visit. Professional time spent includes the following activities: Preparing to see the patient (review of tests), Obtaining and/or reviewing separately obtained history (admission/discharge record), Performing a medically appropriate examination and/or evaluation , Ordering medications/tests/procedures, referring and communicating with other health care professionals, Documenting clinical information in the EMR, Independently interpreting results (not separately reported), Communicating results to the patient/family/caregiver, Counseling and educating the patient/family/caregiver and Care coordination (not separately reported).      Review of Systems: Review of Systems  Constitutional:  Positive for fever and malaise/fatigue. Negative for chills and weight loss.  HENT:  Negative for congestion and sore throat.   Eyes:  Negative for blurred vision and photophobia.  Respiratory:  Negative for cough, shortness of breath and wheezing.   Cardiovascular:   Negative for chest pain, palpitations and leg swelling.  Gastrointestinal:  Negative for abdominal pain, blood in stool, constipation, diarrhea, heartburn, melena, nausea and vomiting.  Genitourinary:  Negative for dysuria, flank pain and hematuria.  Musculoskeletal:  Positive for myalgias. Negative for back pain, falls and joint pain.  Skin:  Positive for rash. Negative for itching.  Neurological:  Negative for dizziness, focal weakness, loss of consciousness, weakness and headaches.  Endo/Heme/Allergies:  Does not bruise/bleed easily.  Psychiatric/Behavioral:  Negative for depression and suicidal ideas. The patient does not have insomnia.     Past Medical History:  Diagnosis Date   Abdominal wall abscess 10/15/2020   Abdominal wall cellulitis 10/15/2020   Anxiety    Cellulitis 06/04/2021   Cellulitis of abdominal wall 02/14/2022   CHF (congestive heart failure) (HCC)    Diabetes mellitus    Hypercholesteremia    Hypertension    Obesity    Seizures (HCC)    Stroke [facility])     Social History   Tobacco Use   Smoking status: Never   Smokeless tobacco: Never  Vaping Use   Vaping status: Never Used  Substance Use Topics   Alcohol  use: No   Drug use: No    Family History  Adopted: Yes   Allergies  Allergen Reactions   Latex Other (See Comments)    Welts - cant wear them, but can be touched by someone who wears them.   Penicillins Hives and Itching    Tolerated ceftriaxone  10/2020  facial swelling    OBJECTIVE: Blood pressure (!) 133/28, pulse 85, temperature 97.7 F (36.5 C), temperature source Oral, resp. rate 16, height 5' 4 (1.626 m), weight (!) 142.8 kg, SpO2 95%.  Physical Exam Constitutional:      General: She is not in acute distress.    Appearance: Normal appearance. She is well-developed. She is not ill-appearing or diaphoretic.  HENT:     Head: Normocephalic and atraumatic.     Right Ear: Hearing and external ear normal.     Left Ear: Hearing and  external ear normal.     Nose: No nasal deformity or rhinorrhea.  Eyes:     General: No scleral icterus.    Conjunctiva/sclera: Conjunctivae normal.     Right eye: Right conjunctiva is not injected.     Left eye: Left conjunctiva is not injected.     Pupils: Pupils are equal, round, and reactive to light.  Neck:     Vascular: No JVD.  Cardiovascular:     Rate and Rhythm: Normal rate and regular rhythm.     Heart sounds: Normal heart sounds, S1 normal and S2 normal. No murmur heard.    No friction rub. No gallop.  Pulmonary:     Effort: No respiratory distress.     Breath sounds: No stridor. No wheezing, rhonchi or rales.  Chest:     Chest wall: No tenderness.  Abdominal:     General: Bowel sounds are normal. There is no distension.     Palpations: Abdomen is soft.     Tenderness: There is no abdominal tenderness.  Musculoskeletal:     Right shoulder: Normal.     Left shoulder: Normal.     Cervical back: Normal range of motion and neck supple.     Right hip: Normal.     Left hip: Normal.     Right knee: Decreased range of motion.     Left knee: Decreased range of motion. Tenderness present.  Lymphadenopathy:     Head:     Right side of head: No submandibular, preauricular or posterior auricular adenopathy.     Left side of head: No submandibular, preauricular or posterior auricular adenopathy.     Cervical: No cervical adenopathy.     Right cervical: No superficial or deep cervical adenopathy.    Left cervical: No superficial or deep cervical adenopathy.  Skin:    General: Skin is warm and dry.     Coloration: Skin is not pale.     Findings: No abrasion, bruising, ecchymosis, erythema, lesion or rash.     Nails: There is no clubbing.  Neurological:     General: No focal deficit present.     Mental Status: She is alert and oriented to person, place, and time.     Sensory: No sensory deficit.     Coordination: Coordination normal.     Gait: Gait normal.  Psychiatric:         Attention and Perception: Perception normal. She is attentive.        Speech: Speech normal.        Behavior: Behavior normal. Behavior is cooperative.  Thought Content: Thought content normal.        Cognition and Memory: Cognition and memory normal.        Judgment: Judgment normal.    Legs     Left lower extremity    Lab Results Lab Results  Component Value Date   WBC 10.2 05/18/2023   HGB 13.0 05/18/2023   HCT 39.1 05/18/2023   MCV 85.0 05/18/2023   PLT 387 05/18/2023    Lab Results  Component Value Date   CREATININE 0.70 05/18/2023   BUN 9 05/18/2023   NA 135 05/18/2023   K 3.5 05/18/2023   CL 98 05/18/2023   CO2 26 05/18/2023    Lab Results  Component Value Date   ALT 23 05/13/2023   AST 20 05/13/2023   ALKPHOS 127 (H) 05/13/2023   BILITOT 0.6 05/13/2023     Microbiology: Recent Results (from the past 240 hours)  Blood Culture (routine x 2)     Status: None   Collection Time: 05/13/23 10:10 AM   Specimen: BLOOD RIGHT HAND  Result Value Ref Range Status   Specimen Description BLOOD RIGHT HAND  Final   Special Requests   Final    BOTTLES DRAWN AEROBIC AND ANAEROBIC Blood Culture results may not be optimal due to an inadequate volume of blood received in culture bottles   Culture  Setup Time   Final    GRAM POSITIVE COCCI IN CLUSTERS AEROBIC BOTTLE ONLY CRITICAL RESULT CALLED TO, READ BACK BY AND VERIFIED WITH: PHARMD J. LEDFORD 05/15/2023 @ 2218 BY AB    Culture   Final    KOCURIA RHIZOPHILA Standardized susceptibility testing for this organism is not available. Performed at [facility], [address] [place], [place] [address]    Report Status 05/17/2023 FINAL  Final  Blood Culture (routine x 2)     Status: None   Collection Time: 05/13/23 10:10 AM   Specimen: BLOOD RIGHT HAND  Result Value Ref Range Status   Specimen Description BLOOD RIGHT HAND  Final   Special Requests   Final    BOTTLES DRAWN AEROBIC AND ANAEROBIC  Blood Culture adequate volume   Culture   Final    NO GROWTH 5 DAYS Performed at [facility], [address] [place], [place] [address]    Report Status 05/18/2023 FINAL  Final  Blood Culture ID Panel (Reflexed)     Status: None   Collection Time: 05/13/23 10:10 AM  Result Value Ref Range Status   Enterococcus faecalis NOT DETECTED NOT DETECTED Final   Enterococcus Faecium NOT DETECTED NOT DETECTED Final   Listeria monocytogenes NOT DETECTED NOT DETECTED Final   Staphylococcus species NOT DETECTED NOT DETECTED Final   Staphylococcus aureus (BCID) NOT DETECTED NOT DETECTED Final   Staphylococcus epidermidis NOT DETECTED NOT DETECTED Final   Staphylococcus lugdunensis NOT DETECTED NOT DETECTED Final   Streptococcus species NOT DETECTED NOT DETECTED Final   Streptococcus agalactiae NOT DETECTED NOT DETECTED Final   Streptococcus pneumoniae NOT DETECTED NOT DETECTED Final   Streptococcus pyogenes NOT DETECTED NOT DETECTED Final   A.calcoaceticus-baumannii NOT DETECTED NOT DETECTED Final   Bacteroides fragilis NOT DETECTED NOT DETECTED Final   Enterobacterales NOT DETECTED NOT DETECTED Final   Enterobacter cloacae complex NOT DETECTED NOT DETECTED Final   Escherichia coli NOT DETECTED NOT DETECTED Final   Klebsiella aerogenes NOT DETECTED NOT DETECTED Final   Klebsiella oxytoca NOT DETECTED NOT DETECTED Final   Klebsiella pneumoniae NOT DETECTED NOT  DETECTED Final   Proteus species NOT DETECTED NOT DETECTED Final   Salmonella species NOT DETECTED NOT DETECTED Final   Serratia marcescens NOT DETECTED NOT DETECTED Final   Haemophilus influenzae NOT DETECTED NOT DETECTED Final   Neisseria meningitidis NOT DETECTED NOT DETECTED Final   Pseudomonas aeruginosa NOT DETECTED NOT DETECTED Final   Stenotrophomonas maltophilia NOT DETECTED NOT DETECTED Final   Candida albicans NOT DETECTED NOT DETECTED Final   Candida auris NOT DETECTED NOT DETECTED Final   Candida glabrata NOT DETECTED  NOT DETECTED Final   Candida krusei NOT DETECTED NOT DETECTED Final   Candida parapsilosis NOT DETECTED NOT DETECTED Final   Candida tropicalis NOT DETECTED NOT DETECTED Final   Cryptococcus neoformans/gattii NOT DETECTED NOT DETECTED Final    Comment: Performed at [facility], [address] [place], [place] [address]    Jomarie Fleeta Rothman, MD [facility] [phone] pager  05/18/2023, 5:00 PM

## 2023-05-18 NOTE — Procedures (Signed)
 Procedure: Left knee aspiration and injection   Indication: Left knee effusion(s)   Surgeon: Ozell Ned, PA-C   Assist: None   Anesthesia: Topical refrigerant   EBL: None   Complications: None   Findings: After risks/benefits explained patient desires to undergo procedure. Consent obtained and time out performed. The left knee was sterilely prepped and aspirated. 21ml brownish yellow opaque fluid obtained. 6ml 0.5% Marcaine  instilled. Pt tolerated the procedure well.       Ozell DOROTHA Ned, PA-C Orthopedic Surgery [phone]

## 2023-05-18 NOTE — Progress Notes (Signed)
 Pharmacy Antibiotic Note  Donna Snow is a 73 y.o. female admitted on 05/13/2023 with cellulitis.  Pharmacy has been consulted for cefepime  and vancomycin  dosing.  Patient has a history of T2DM and presented with left leg pain and swelling from a dog scratch. Her blood cultures are now positive for gram positive cocci in clusters, pending speciation.   CrCl has improved significantly since initial dosing on 12/29. Scr on 12/29 was 1.23, Scr with most recent labs is 0.84. Her weight has also been updated. Dose of vancomycin  and cefepime  still appropriate. Will opt to get levels as she is likely at steady state.   Steady-state level assessment: 750 mg given 1/2 10:46 P 29 1/2 12:06 T 12 1/2 21:05 Ke 0.0982 hr-1 cAUC 443.5 mcg*hr/mL  Plan: Continue Vancomycin  750mg  q12 hours F/u cultures, LOT, renal func  Height: 5' 4 (162.6 cm) (per PT) Weight: (!) 142.8 kg (314 lb 13.1 oz) IBW/kg (Calculated) : 54.7  Temp (24hrs), Avg:98.3 F (36.8 C), Min:97.7 F (36.5 C), Max:98.8 F (37.1 C)  Recent Labs  Lab 05/13/23 1010 05/14/23 0533 05/14/23 1526 05/15/23 0547 05/16/23 0749 05/16/23 1424 05/17/23 0425 05/17/23 1206 05/17/23 2105 05/18/23 0745  WBC  --  11.8*  --  10.6*  --  12.0* 9.5  --   --  10.2  CREATININE  --  0.93 0.83 0.84 0.77  --  0.79  --   --  0.70  LATICACIDVEN 1.9  --   --   --   --   --   --   --   --   --   VANCOTROUGH  --   --   --   --   --   --   --   --  12*  --   VANCOPEAK  --   --   --   --   --   --   --  29*  --   --     Estimated Creatinine Clearance: 91.5 mL/min (by C-G formula based on SCr of 0.7 mg/dL).    Allergies  Allergen Reactions   Latex Other (See Comments)    Welts - cant wear them, but can be touched by someone who wears them.   Penicillins Hives and Itching    Tolerated ceftriaxone  10/2020  facial swelling    Antimicrobials this admission: Vanc 12/29> Cefepime  12/29>  Flagyl  12/29 x1  Dose adjustments this  admission:   Microbiology results: 12/29 BCX KOCURIA RHIZOPHILA  1/3 Bcx   Thank you for allowing pharmacy to be a part of this patient's care.  Rankin Sams, PharmD, BCPS, BCCCP Clinical Pharmacist

## 2023-05-19 ENCOUNTER — Inpatient Hospital Stay (HOSPITAL_COMMUNITY): Payer: Medicare Other

## 2023-05-19 DIAGNOSIS — R7881 Bacteremia: Secondary | ICD-10-CM | POA: Diagnosis not present

## 2023-05-19 DIAGNOSIS — B001 Herpesviral vesicular dermatitis: Secondary | ICD-10-CM | POA: Diagnosis not present

## 2023-05-19 LAB — CBC
HCT: 37.5 % (ref 36.0–46.0)
Hemoglobin: 12.5 g/dL (ref 12.0–15.0)
MCH: 28.2 pg (ref 26.0–34.0)
MCHC: 33.3 g/dL (ref 30.0–36.0)
MCV: 84.7 fL (ref 80.0–100.0)
Platelets: 424 10*3/uL — ABNORMAL HIGH (ref 150–400)
RBC: 4.43 MIL/uL (ref 3.87–5.11)
RDW: 13.7 % (ref 11.5–15.5)
WBC: 10.2 10*3/uL (ref 4.0–10.5)
nRBC: 0 % (ref 0.0–0.2)

## 2023-05-19 LAB — HEPATITIS PANEL, ACUTE
HCV Ab: NONREACTIVE
Hep A IgM: NONREACTIVE
Hep B C IgM: NONREACTIVE
Hepatitis B Surface Ag: NONREACTIVE

## 2023-05-19 LAB — BASIC METABOLIC PANEL
Anion gap: 10 (ref 5–15)
BUN: 9 mg/dL (ref 8–23)
CO2: 26 mmol/L (ref 22–32)
Calcium: 8.3 mg/dL — ABNORMAL LOW (ref 8.9–10.3)
Chloride: 97 mmol/L — ABNORMAL LOW (ref 98–111)
Creatinine, Ser: 0.78 mg/dL (ref 0.44–1.00)
GFR, Estimated: >60 mL/min (ref >60)
Glucose, Bld: 217 mg/dL — ABNORMAL HIGH (ref 70–99)
Potassium: 3.4 mmol/L — ABNORMAL LOW (ref 3.5–5.1)
Sodium: 133 mmol/L — ABNORMAL LOW (ref 135–145)

## 2023-05-19 LAB — ECHOCARDIOGRAM COMPLETE
AR max vel: 1.69 cm2
AV Area VTI: 1.71 cm2
AV Area mean vel: 1.73 cm2
AV Mean grad: 7 mm[Hg]
AV Peak grad: 14.3 mm[Hg]
Ao pk vel: 1.89 m/s
Area-P 1/2: 3.91 cm2
Height: 64 in
S' Lateral: 3.6 cm
Weight: 5089.98 [oz_av]

## 2023-05-19 LAB — GLUCOSE, CAPILLARY
Glucose-Capillary: 226 mg/dL — ABNORMAL HIGH (ref 70–99)
Glucose-Capillary: 151 mg/dL — ABNORMAL HIGH (ref 70–99)
Glucose-Capillary: 205 mg/dL — ABNORMAL HIGH (ref 70–99)
Glucose-Capillary: 137 mg/dL — ABNORMAL HIGH (ref 70–99)

## 2023-05-19 LAB — C-REACTIVE PROTEIN: CRP: 32.2 mg/dL — ABNORMAL HIGH (ref <1.0)

## 2023-05-19 LAB — PROTIME-INR
INR: 3.4 — ABNORMAL HIGH (ref 0.8–1.2)
Prothrombin Time: 34.8 s — ABNORMAL HIGH (ref 11.4–15.2)

## 2023-05-19 LAB — HIV ANTIBODY (ROUTINE TESTING W REFLEX): HIV Screen 4th Generation wRfx: NONREACTIVE

## 2023-05-19 LAB — SEDIMENTATION RATE: Sed Rate: 117 mm/h — ABNORMAL HIGH (ref 0–22)

## 2023-05-19 MED ORDER — COLCHICINE 0.6 MG PO TABS
0.6000 mg | ORAL_TABLET | Freq: Once | ORAL | Status: AC
  Administered 2023-05-19: 0.6 mg via ORAL
  Filled 2023-05-19: qty 1

## 2023-05-19 MED ORDER — POTASSIUM CHLORIDE 20 MEQ PO PACK
20.0000 meq | PACK | Freq: Once | ORAL | Status: AC
  Administered 2023-05-19: 20 meq via ORAL
  Filled 2023-05-19: qty 1

## 2023-05-19 MED ORDER — LINEZOLID 600 MG PO TABS
600.0000 mg | ORAL_TABLET | Freq: Two times a day (BID) | ORAL | Status: AC
  Administered 2023-05-19 – 2023-05-20 (×4): 600 mg via ORAL
  Filled 2023-05-19 (×4): qty 1

## 2023-05-19 MED ORDER — COLCHICINE 0.6 MG PO TABS
1.2000 mg | ORAL_TABLET | Freq: Once | ORAL | Status: AC
  Administered 2023-05-19: 1.2 mg via ORAL
  Filled 2023-05-19: qty 2

## 2023-05-19 MED ORDER — LIDOCAINE HCL (PF) 2 % IJ SOLN
0.0000 mL | Freq: Once | INTRAMUSCULAR | Status: AC | PRN
Start: 2023-05-19 — End: 2023-05-19
  Administered 2023-05-19: 20 mL via INTRADERMAL
  Filled 2023-05-19: qty 20

## 2023-05-19 MED ORDER — NAPROXEN 250 MG PO TABS: 500.0000 mg | ORAL_TABLET | Freq: Two times a day (BID) | ORAL | Status: DC

## 2023-05-19 MED ORDER — HYDROMORPHONE HCL 1 MG/ML IJ SOLN
2.0000 mg | Freq: Once | INTRAMUSCULAR | Status: AC
  Administered 2023-05-19: 2 mg via INTRAVENOUS
  Filled 2023-05-19: qty 2

## 2023-05-19 MED ORDER — PERFLUTREN LIPID MICROSPHERE
1.0000 mL | INTRAVENOUS | Status: AC | PRN
  Administered 2023-05-19: 4 mL via INTRAVENOUS

## 2023-05-19 NOTE — Progress Notes (Signed)
 Subjective: No new complaints   Antibiotics:  Anti-infectives (From admission, onward)    Start     Dose/Rate Route Frequency Ordered Stop   05/18/23 2200  vancomycin  (VANCOREADY) IVPB 750 mg/150 mL  Status:  Discontinued        750 mg 150 mL/hr over 60 Minutes Intravenous Every 12 hours 05/18/23 1714 05/18/23 1736   05/18/23 2200  vancomycin  (VANCOCIN ) 750 mg in sodium chloride  0.9 % 250 mL IVPB       Note to Pharmacy: Indication: Bacteremia   750 mg 265 mL/hr over 60 Minutes Intravenous Every 12 hours 05/18/23 1736     05/18/23 1645  cefTRIAXone  (ROCEPHIN ) 2 g in sodium chloride  0.9 % 100 mL IVPB        2 g 200 mL/hr over 30 Minutes Intravenous Every 24 hours 05/18/23 1552     05/17/23 2200  ceFEPIme  (MAXIPIME ) 2 g in sodium chloride  0.9 % 100 mL IVPB  Status:  Discontinued        2 g 200 mL/hr over 30 Minutes Intravenous Every 8 hours 05/17/23 2150 05/18/23 1551   05/14/23 2200  vancomycin  (VANCOCIN ) 750 mg in sodium chloride  0.9 % 250 mL IVPB  Status:  Discontinued        750 mg 250 mL/hr over 60 Minutes Intravenous Every 12 hours 05/14/23 1226 05/18/23 1551   05/13/23 2200  ceFEPIme  (MAXIPIME ) 2 g in sodium chloride  0.9 % 100 mL IVPB  Status:  Discontinued        2 g 200 mL/hr over 30 Minutes Intravenous Every 12 hours 05/13/23 1445 05/17/23 2150   05/13/23 2200  vancomycin  (VANCOREADY) IVPB 750 mg/150 mL  Status:  Discontinued        750 mg 150 mL/hr over 60 Minutes Intravenous Every 12 hours 05/13/23 1447 05/14/23 1226   05/13/23 1045  ceFEPIme  (MAXIPIME ) 2 g in sodium chloride  0.9 % 100 mL IVPB        2 g 200 mL/hr over 30 Minutes Intravenous  Once 05/13/23 1031 05/13/23 1121   05/13/23 1045  metroNIDAZOLE  (FLAGYL ) IVPB 500 mg        500 mg 100 mL/hr over 60 Minutes Intravenous  Once 05/13/23 1031 05/13/23 1347   05/13/23 1045  vancomycin  (VANCOCIN ) IVPB 1000 mg/200 mL premix        1,000 mg 200 mL/hr over 60 Minutes Intravenous  Once 05/13/23 1031 05/13/23  1518       Medications: Scheduled Meds:  acetaminophen   1,000 mg Oral Q6H   busPIRone   20 mg Oral TID   ezetimibe   10 mg Oral Daily   FLUoxetine   60 mg Oral Daily   furosemide   60 mg Oral Daily   insulin  aspart  0-20 Units Subcutaneous TID WC   insulin  aspart  0-5 Units Subcutaneous QHS   insulin  glargine-yfgn  24 Units Subcutaneous BID   nystatin    Topical BID   pantoprazole   40 mg Oral BID   phenytoin   400 mg Oral Daily   polyethylene glycol  17 g Oral BID   prazosin   1 mg Oral q AM   And   prazosin   2 mg Oral QHS   rosuvastatin   40 mg Oral Daily   senna-docusate  2 tablet Oral Daily   topiramate   200 mg Oral BID   Warfarin - Pharmacist Dosing Inpatient   Does not apply q1600   Continuous Infusions:  cefTRIAXone  (ROCEPHIN )  IV 2 g (05/18/23 1636)  vancomycin  (VANCOCIN ) 750 mg in sodium chloride  0.9 % 250 mL IVPB 750 mg (05/19/23 0859)   PRN Meds:.HYDROmorphone , hydrOXYzine , ondansetron  (ZOFRAN ) IV, sodium chloride , trazodone     Objective: Weight change: 1.5 kg  Intake/Output Summary (Last 24 hours) at 05/19/2023 1240 Last data filed at 05/19/2023 1013 Gross per 24 hour  Intake 1570 ml  Output 600 ml  Net 970 ml   Blood pressure (!) 140/67, pulse 89, temperature 98.7 F (37.1 C), resp. rate (!) 22, height 5' 4 (1.626 m), weight (!) 144.3 kg, SpO2 94%. Temp:  [97.7 F (36.5 C)-98.7 F (37.1 C)] 98.7 F (37.1 C) (01/04 0740) Pulse Rate:  [80-95] 89 (01/04 0740) Resp:  [16-22] 22 (01/04 0740) BP: (133-154)/(28-67) 140/67 (01/04 0740) SpO2:  [93 %-98 %] 94 % (01/04 0740) Weight:  [144.3 kg] 144.3 kg (01/04 0455)  Physical Exam: Physical Exam Constitutional:      General: She is not in acute distress.    Appearance: She is well-developed. She is not diaphoretic.  HENT:     Head: Normocephalic and atraumatic.     Right Ear: External ear normal.     Left Ear: External ear normal.     Mouth/Throat:     Pharynx: No oropharyngeal exudate.  Eyes:     General:  No scleral icterus.    Conjunctiva/sclera: Conjunctivae normal.     Pupils: Pupils are equal, round, and reactive to light.  Cardiovascular:     Rate and Rhythm: Normal rate and regular rhythm.  Pulmonary:     Effort: Pulmonary effort is normal. No respiratory distress.     Breath sounds: Normal breath sounds. No wheezing.  Abdominal:     General: Bowel sounds are normal. There is no distension.     Palpations: Abdomen is soft.     Tenderness: There is no abdominal tenderness. There is no rebound.  Musculoskeletal:        General: No tenderness. Normal range of motion.  Lymphadenopathy:     Cervical: No cervical adenopathy.  Skin:    General: Skin is warm and dry.     Coloration: Skin is not pale.     Findings: No erythema or rash.  Neurological:     General: No focal deficit present.     Mental Status: She is alert and oriented to person, place, and time.     Motor: No abnormal muscle tone.     Coordination: Coordination normal.  Psychiatric:        Mood and Affect: Mood normal.        Behavior: Behavior normal.        Thought Content: Thought content normal.        Judgment: Judgment normal.      CBC:    BMET Recent Labs    05/18/23 0745 05/19/23 0428  NA 135 133*  K 3.5 3.4*  CL 98 97*  CO2 26 26  GLUCOSE 165* 217*  BUN 9 9  CREATININE 0.70 0.78  CALCIUM  8.5* 8.3*     Liver Panel  No results for input(s): PROT, ALBUMIN, AST, ALT, ALKPHOS, BILITOT, BILIDIR, IBILI in the last 72 hours.     Sedimentation Rate Recent Labs    05/19/23 0929  ESRSEDRATE 117*   C-Reactive Protein Recent Labs    05/17/23 1206 05/19/23 0929  CRP 30.6* 32.2*    Micro Results: Recent Results (from the past 720 hours)  Blood Culture (routine x 2)     Status: None   Collection Time:  05/13/23 10:10 AM   Specimen: BLOOD RIGHT HAND  Result Value Ref Range Status   Specimen Description BLOOD RIGHT HAND  Final   Special Requests   Final    BOTTLES  DRAWN AEROBIC AND ANAEROBIC Blood Culture results may not be optimal due to an inadequate volume of blood received in culture bottles   Culture  Setup Time   Final    GRAM POSITIVE COCCI IN CLUSTERS AEROBIC BOTTLE ONLY CRITICAL RESULT CALLED TO, READ BACK BY AND VERIFIED WITH: PHARMD J. LEDFORD 05/15/2023 @ 2218 BY AB    Culture   Final    KOCURIA RHIZOPHILA Standardized susceptibility testing for this organism is not available. Performed at [facility], [address], [place], [place] [address]    Report Status 05/17/2023 FINAL  Final  Blood Culture (routine x 2)     Status: None   Collection Time: 05/13/23 10:10 AM   Specimen: BLOOD RIGHT HAND  Result Value Ref Range Status   Specimen Description BLOOD RIGHT HAND  Final   Special Requests   Final    BOTTLES DRAWN AEROBIC AND ANAEROBIC Blood Culture adequate volume   Culture   Final    NO GROWTH 5 DAYS Performed at [facility], [address], [place], [place] [address]    Report Status 05/18/2023 FINAL  Final  Blood Culture ID Panel (Reflexed)     Status: None   Collection Time: 05/13/23 10:10 AM  Result Value Ref Range Status   Enterococcus faecalis NOT DETECTED NOT DETECTED Final   Enterococcus Faecium NOT DETECTED NOT DETECTED Final   Listeria monocytogenes NOT DETECTED NOT DETECTED Final   Staphylococcus species NOT DETECTED NOT DETECTED Final   Staphylococcus aureus (BCID) NOT DETECTED NOT DETECTED Final   Staphylococcus epidermidis NOT DETECTED NOT DETECTED Final   Staphylococcus lugdunensis NOT DETECTED NOT DETECTED Final   Streptococcus species NOT DETECTED NOT DETECTED Final   Streptococcus agalactiae NOT DETECTED NOT DETECTED Final   Streptococcus pneumoniae NOT DETECTED NOT DETECTED Final   Streptococcus pyogenes NOT DETECTED NOT DETECTED Final   A.calcoaceticus-baumannii NOT DETECTED NOT DETECTED Final   Bacteroides fragilis NOT DETECTED NOT DETECTED Final   Enterobacterales NOT DETECTED NOT  DETECTED Final   Enterobacter cloacae complex NOT DETECTED NOT DETECTED Final   Escherichia coli NOT DETECTED NOT DETECTED Final   Klebsiella aerogenes NOT DETECTED NOT DETECTED Final   Klebsiella oxytoca NOT DETECTED NOT DETECTED Final   Klebsiella pneumoniae NOT DETECTED NOT DETECTED Final   Proteus species NOT DETECTED NOT DETECTED Final   Salmonella species NOT DETECTED NOT DETECTED Final   Serratia marcescens NOT DETECTED NOT DETECTED Final   Haemophilus influenzae NOT DETECTED NOT DETECTED Final   Neisseria meningitidis NOT DETECTED NOT DETECTED Final   Pseudomonas aeruginosa NOT DETECTED NOT DETECTED Final   Stenotrophomonas maltophilia NOT DETECTED NOT DETECTED Final   Candida albicans NOT DETECTED NOT DETECTED Final   Candida auris NOT DETECTED NOT DETECTED Final   Candida glabrata NOT DETECTED NOT DETECTED Final   Candida krusei NOT DETECTED NOT DETECTED Final   Candida parapsilosis NOT DETECTED NOT DETECTED Final   Candida tropicalis NOT DETECTED NOT DETECTED Final   Cryptococcus neoformans/gattii NOT DETECTED NOT DETECTED Final    Comment: Performed at [facility], [address] [place], [place] [address]  Body fluid culture w Gram Stain     Status: None (Preliminary result)   Collection Time: 05/18/23 12:53 PM   Specimen: Synovium; Body  Fluid  Result Value Ref Range Status   Specimen Description SYNOVIAL  Final   Special Requests KNEE  Final   Gram Stain   Final    NO WBC SEEN NO ORGANISMS SEEN Performed at [facility], [address] [place], [place] [address]    Culture PENDING  Incomplete   Report Status PENDING  Incomplete  Culture, blood (Routine X 2) w Reflex to ID Panel     Status: None (Preliminary result)   Collection Time: 05/18/23  6:20 PM   Specimen: BLOOD  Result Value Ref Range Status   Specimen Description BLOOD BLOOD RIGHT ARM  Final   Special Requests   Final    BOTTLES DRAWN AEROBIC AND ANAEROBIC Blood Culture results may not be  optimal due to an inadequate volume of blood received in culture bottles   Culture   Final    NO GROWTH < 24 HOURS Performed at [facility], [address] [place], [place] [address]    Report Status PENDING  Incomplete  Culture, blood (Routine X 2) w Reflex to ID Panel     Status: None (Preliminary result)   Collection Time: 05/18/23  6:20 PM   Specimen: BLOOD  Result Value Ref Range Status   Specimen Description BLOOD BLOOD LEFT HAND  Final   Special Requests   Final    BOTTLES DRAWN AEROBIC AND ANAEROBIC Blood Culture results may not be optimal due to an inadequate volume of blood received in culture bottles   Culture   Final    NO GROWTH < 24 HOURS Performed at [facility], [address], [place], [place] [address]    Report Status PENDING  Incomplete    Studies/Results: No results found.    Assessment/Plan:  INTERVAL HISTORY: skin is stable   Principal Problem:   Cellulitis of left lower extremity from knee to ankle Active Problems:   Hyperglycemia due to diabetes mellitus (HCC)   Type 2 diabetes mellitus (HCC)   AF (paroxysmal atrial fibrillation) (HCC)   Chronic anticoagulation - on coumadin  for afib   OSA on CPAP   GAD (generalized anxiety disorder)   Sepsis without acute organ dysfunction (HCC)   Chronic constipation    Donna Snow is a 73 y.o. female with history of atrial fibrillation on Coumadin  asthma hyperlipidemia prior stroke seizures depression who sustained a scratch to her lower extremities of a dog and developed erythema proximal and scratch that worsened in the 2 to 3 days prior to mission to [facility] she also has had pain in her knees bilateral particular the left side there was concern for septic arthritis.  When she was admitted blood cultures were taken which isolated a Kocuria Rhizophila from 1/2 sites  #1 Kocuria on blood culture: I continued targetting this organism yesterday but I am confident iti s a  contaminant  I will dc her vancomycin   I am switching to zyvox  to hedge bets here since it would also cover this organism  #2 Cellulitis:   I switched her to ceftriaxone  yesterday with vancomycin  to and the former case better cover against Pasteurella multifaceted and canis and in the latter case cover the Kocuria  She is now on traumatic sound and Zyvox .  I am becoming increasingly suspicious that this is not a cellulitis at all.  Therefore I performed 2 punch biopsies 1 that is sent to pathology which will not be read of course until Monday and another 1 that  can be sent for AFB and fungal cultures though I am skeptical that she would have a nontuberculous mycobacteria or fungal organism involved in this skin lesion.  Elevate leg  3.  Penicillin allergy:  Would recommend amoxicillin  challenge on Monday  I have personally spent 50 minutes involved in face-to-face and non-face-to-face activities for this patient on the day of the visit. Professional time spent includes the following activities: Preparing to see the patient (review of tests), Obtaining and/or reviewing separately obtained history (admission/discharge record), Performing a medically appropriate examination and/or evaluation , Ordering medications/tests/procedures, referring and communicating with other health care professionals, Documenting clinical information in the EMR, Independently interpreting results (not separately reported), Communicating results to the patient/family/caregiver, Counseling and educating the patient/family/caregiver and Care coordination (not separately reported).     LOS: 5 days   Donna Snow 05/19/2023, 12:40 PM

## 2023-05-19 NOTE — Progress Notes (Signed)
 Echocardiogram 2D Echocardiogram has been performed.  Donna Snow 05/19/2023, 2:55 PM

## 2023-05-19 NOTE — Progress Notes (Signed)
 HD#5 SUBJECTIVE:  Patient Summary: Donna Snow is a 73 y.o. female with pertinent PMH of 2 diabetes on insulin  who presented with left leg pain and is admitted for cellulitis.   Overnight Events: None  Interim History:   Pt seen bedside this AM. She states that the pain in her left knee is not getting better, and was keeping her up all night.   OBJECTIVE:  Vital Signs: Vitals:   05/18/23 1554 05/18/23 2018 05/19/23 0455 05/19/23 0740  BP: (!) 133/28 (!) 143/61 (!) 154/66 (!) 140/67  Pulse: 85 80 95 89  Resp: 16 18 20  (!) 22  Temp: 97.7 F (36.5 C) 98.2 F (36.8 C) 98.7 F (37.1 C) 98.7 F (37.1 C)  TempSrc: Oral Oral Oral   SpO2: 95% 98% 93% 94%  Weight:   (!) 144.3 kg   Height:       Supplemental O2: Room Air SpO2: 94 % O2 Flow Rate (L/min): 2 L/min  Filed Weights   05/15/23 1011 05/18/23 0500 05/19/23 0455  Weight: (!) 143.5 kg (!) 142.8 kg (!) 144.3 kg     Intake/Output Summary (Last 24 hours) at 05/19/2023 0832 Last data filed at 05/19/2023 0746 Gross per 24 hour  Intake 1580 ml  Output 1040 ml  Net 540 ml   Net IO Since Admission: -1,744.86 mL [05/19/23 0832]  Physical Exam: Physical Exam Constitutional:      General: She is not in acute distress.    Appearance: She is obese. She is ill-appearing.  HENT:     Nose: Nose normal.  Cardiovascular:     Rate and Rhythm: Normal rate.     Pulses: Normal pulses.  Pulmonary:     Effort: Pulmonary effort is normal.     Breath sounds: No wheezing or rales.  Abdominal:     General: Abdomen is flat. There is no distension.     Tenderness: There is no abdominal tenderness.  Musculoskeletal:        General: Swelling and tenderness present.     Right lower leg: No edema.     Left lower leg: Edema present.     Comments: [name], appears to be improving. Severe tenderness to palpation in L knee. Very limited ROM of L leg.  Skin:    General: Skin is warm and dry.     Findings: Rash present.   Neurological:     General: No focal deficit present.     Mental Status: She is alert.  Psychiatric:        Mood and Affect: Mood normal.        Behavior: Behavior normal.   Patient Lines/Drains/Airways Status     Active Line/Drains/Airways     Name Placement date Placement time Site Days   Peripheral IV 05/14/23 20 G 1.88 Left;Posterior Forearm 05/14/23  2333  Forearm  4             ASSESSMENT/PLAN:  Assessment: Principal Problem:   Cellulitis of left lower extremity from knee to ankle Active Problems:   Hyperglycemia due to diabetes mellitus (HCC)   Type 2 diabetes mellitus (HCC)   AF (paroxysmal atrial fibrillation) (HCC)   Chronic anticoagulation - on coumadin  for afib   OSA on CPAP   GAD (generalized anxiety disorder)   Sepsis without acute organ dysfunction (HCC)   Chronic constipation  Donna Snow is a 73 y.o. female with pertinent PMH of T2 diabetes on insulin  who presented with left leg pain and is admitted  for cellulitis.    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 I don't believe these findings are suggestive of septic arthritis, and I would've expected pain to improve slightly at least with aspiration. Findings did also show calcium  pyrophosphate crystals, which is consistent with pseudogout which could explain her pain.    - Appreciate IR and Ortho evaluations  - Continue IV antibiotics and pain control. - Vancomycin , cefepime  IV now day 7, ID to narrow on Monday - Will start colchine, 1.2mg  and then .6mg  one hour after - For pain Tylenol  1000 every 8 hours, Dilaudid  4 mg every 4h as needed. Bowel regimen: Miralax  BID, Senna-docusate BID, fleet enema prn. - Trend white count and blood cultures   T2DM AGMA - possible mild DKA  (resolved) Mild AG resolved with insulin . Most recent A1c is 7.4%. Takes Humulin R  11-20 units TID before meals, typically 300 units daily at home. She has some stomach upset but it is ongoing for several days and likely related to her cellulitis. She was given an injection of methylprednisone yesterday for her aspiration, which could explain her elevated Glucose. Will monitor throughout the day and increase sliding scale/LA insulin  as needed.  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  Often needs fleet enemas in order to have BM. Has bedside commode. - Senna-docusate 1 tablet oral BID, Miralax  BID, Fleet enema prn     Dyspnea associated with Anxiety and OSA Intermittent dyspnea but no hypoxia. CPAP provided at that time and resolved the issue. Upon dyspnea, attempt reassurance about overall plan, will evaluate need for anxiolytics, oxygen available for comfort. - Continue home Buspar  20mg  TID. Atarax  50mg    Ambulatory dysfunction due to severe pain Some difficulty with walking, getting to toilet. RN assisting. PT consult.   Paroxysmal Afib Rate/Rhythm controlled without medicine. She takes warfarin outpatient. Will continue here per pharmacy.   Chronic HFpEF without exacerbation - Resume lasix  60 every day (home dose is BID). Hold spironolactone  12.5mg  every day, losartan  50mg  every day in setting of low normal Bps. Check daily weights.   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  Will continue topiramate  200mg  po BID, Phenytoin  400mg  po qd. HLD - Continue home rosuvastatin  40mg  every day and zetia  10mg  every day  GERD - Continue home Protonix  40mg  qd Night Terrors - Home prazosin  1mg  am and 2mg  at bedtime Insomnia - Trazodone  200 at bedtime, home dose is 300mg  HTN - Holdng home medicines per above   Best Practice: Diet:  Regular diet IVF: none VTE: warfarin Code: Full AB: Vancomycin  and Cefepime  Therapy Recs: PT to see DISPO: Anticipated discharge in 2-3 days to Home pending  pain/infection control .  Signature: Eliya Geiman, MD Internal Medicine Resident, PGY-2 [facility]  Pager: [phone] 8:32 AM, 05/19/2023   Please contact the on call pager after 5 pm and on weekends at [phone].

## 2023-05-19 NOTE — Plan of Care (Signed)

## 2023-05-19 NOTE — Progress Notes (Signed)
         Date: 05/19/2023  Patient name: Eleni Frank  Medical record number: [id number]  Date of birth: 03-28-1951   Procedure: Skin biopsy:  Preop diagnosis: Lesion of unknown etiology infectious versus noninfectious  Postop diagnosis: Same  Permission: Informed consent obtained from the patient and signed and present the chart.  Benefits of the procedure including riving a diagnosis of her lower extremity erythema as well as risks of the procedure including bleeding in particular since she is on warfarin, and infection were explained.  The patient understood the risks and benefits of the procedure and agreed to proceed.  Description: Left lower shin area which was erythematous was cleansed with Betadine .  The subcutaneous tissue was irrigated with opiates amounts of 2% lidocaine  with nearly 10 mL infiltrated out of the skin.  Two 4 mm punch biopsies were obtained 1 was sent to pathology and the other was sent to the microbiology lab for AFB stain and culture and fungal cultures.  Estimated blood loss: Minimal:      Jomarie Fleeta Rothman 05/19/2023, 12:36 PM

## 2023-05-19 NOTE — Progress Notes (Signed)
 PHARMACY - ANTICOAGULATION CONSULT NOTE  Pharmacy Consult for warfarin Indication: atrial fibrillation  Allergies  Allergen Reactions   Latex Other (See Comments)    Welts - cant wear them, but can be touched by someone who wears them.   Penicillins Hives and Itching    Tolerated ceftriaxone  10/2020  facial swelling   Vital Signs: Temp: 98.7 F (37.1 C) (01/04 0740) Temp Source: Oral (01/04 0455) BP: 140/67 (01/04 0740) Pulse Rate: 89 (01/04 0740)  Labs: Recent Labs    05/17/23 0425 05/18/23 0745 05/19/23 0428  HGB 13.0 13.0 12.5  HCT 39.2 39.1 37.5  PLT 311 387 424*  LABPROT 33.8* 33.7* 34.8*  INR 3.3* 3.3* 3.4*  CREATININE 0.79 0.70 0.78    Estimated Creatinine Clearance: 92.1 mL/min (by C-G formula based on SCr of 0.78 mg/dL).   Medical History: Past Medical History:  Diagnosis Date   Abdominal wall abscess 10/15/2020   Abdominal wall cellulitis 10/15/2020   Anxiety    Cellulitis 06/04/2021   Cellulitis of abdominal wall 02/14/2022   CHF (congestive heart failure) (HCC)    Diabetes mellitus    Hypercholesteremia    Hypertension    Obesity    Seizures (HCC)    Stroke [facility])     Assessment: Donna Snow is a 72yoF who presented from home due to cellulitis and possible UTI. Patient is on chronic warfarin for Afib and also Dilantin  for history of seizures. Pharmacy consulted to manage warfarin while inpatient. Patient took warfarin dose 12/29. Most recent Rx 05/10/23 is warfarin 5mg  daily (previous 6mg  tablet discontinued). Called daughter who said patient does her own medications. Daughter tried to review or get a list from patient but hasn't been able to yet so she does not know what medications patient takes. Per patient, 5mg  still caused INR to be high so dose was reduced to 3 point something but she's not exacty sure. Unable to see outpatient warfarin notes.  05/19/2023: INR is supratherapeutic at 3.4 today. Remains on broad spectrum antibiotics which can  lead to increased INR. Phenytoin  resumed from home. Hgb and PLT counts stable today and no new signs of bleeding noted.  25% PO intake documented per chart review. Will hold warfarin today to try to get back into therapeutic range.   Goal of Therapy:  INR 2-3 Monitor platelets by anticoagulation protocol: Yes   Plan:  HOLD warfarin today Monitor daily INR, CBC, clinical course, s/sx of bleed, PO intake/diet, and for any new DDIs  Koren Or, PharmD Clinical Pharmacist 05/19/2023 9:07 AM Please check AMION for all [facility] Pharmacy numbers

## 2023-05-19 NOTE — Plan of Care (Signed)
 Orthopedic Plan of Care Note  Aspirate with 2.5k cells and 93% PMNs. CPPD crystals seen. Gram stain without any organisms or WBC. Cultures negative. Discussed results with my partner. Agree with colchicine . Will continue to monitor symptoms.   Donna DELENA Ada, MD Orthopedic Surgeon

## 2023-05-20 DIAGNOSIS — R600 Localized edema: Secondary | ICD-10-CM

## 2023-05-20 DIAGNOSIS — Z794 Long term (current) use of insulin: Secondary | ICD-10-CM | POA: Diagnosis not present

## 2023-05-20 DIAGNOSIS — M1120 Other chondrocalcinosis, unspecified site: Secondary | ICD-10-CM | POA: Diagnosis not present

## 2023-05-20 DIAGNOSIS — W548XXA Other contact with dog, initial encounter: Secondary | ICD-10-CM

## 2023-05-20 DIAGNOSIS — Z880 Allergy status to penicillin: Secondary | ICD-10-CM

## 2023-05-20 DIAGNOSIS — L03116 Cellulitis of left lower limb: Secondary | ICD-10-CM | POA: Diagnosis not present

## 2023-05-20 DIAGNOSIS — R739 Hyperglycemia, unspecified: Secondary | ICD-10-CM

## 2023-05-20 DIAGNOSIS — E1165 Type 2 diabetes mellitus with hyperglycemia: Secondary | ICD-10-CM | POA: Diagnosis not present

## 2023-05-20 LAB — BASIC METABOLIC PANEL
Anion gap: 13 (ref 5–15)
BUN: 8 mg/dL (ref 8–23)
CO2: 25 mmol/L (ref 22–32)
Calcium: 8.6 mg/dL — ABNORMAL LOW (ref 8.9–10.3)
Chloride: 97 mmol/L — ABNORMAL LOW (ref 98–111)
Creatinine, Ser: 0.75 mg/dL (ref 0.44–1.00)
GFR, Estimated: >60 mL/min (ref >60)
Glucose, Bld: 196 mg/dL — ABNORMAL HIGH (ref 70–99)
Potassium: 3.4 mmol/L — ABNORMAL LOW (ref 3.5–5.1)
Sodium: 135 mmol/L (ref 135–145)

## 2023-05-20 LAB — CBC
HCT: 36.9 % (ref 36.0–46.0)
HCT: 38.2 % (ref 36.0–46.0)
Hemoglobin: 12.2 g/dL (ref 12.0–15.0)
Hemoglobin: 12.7 g/dL (ref 12.0–15.0)
MCH: 28.2 pg (ref 26.0–34.0)
MCH: 28 pg (ref 26.0–34.0)
MCHC: 33.1 g/dL (ref 30.0–36.0)
MCHC: 33.2 g/dL (ref 30.0–36.0)
MCV: 85.2 fL (ref 80.0–100.0)
MCV: 84.1 fL (ref 80.0–100.0)
Platelets: 415 10*3/uL — ABNORMAL HIGH (ref 150–400)
Platelets: 439 10*3/uL — ABNORMAL HIGH (ref 150–400)
RBC: 4.33 MIL/uL (ref 3.87–5.11)
RBC: 4.54 MIL/uL (ref 3.87–5.11)
RDW: 13.4 % (ref 11.5–15.5)
RDW: 13.3 % (ref 11.5–15.5)
WBC: 8.9 10*3/uL (ref 4.0–10.5)
WBC: 9.7 10*3/uL (ref 4.0–10.5)
nRBC: 0 % (ref 0.0–0.2)
nRBC: 0 % (ref 0.0–0.2)

## 2023-05-20 LAB — PROTIME-INR
INR: 2.8 — ABNORMAL HIGH (ref 0.8–1.2)
Prothrombin Time: 29.7 s — ABNORMAL HIGH (ref 11.4–15.2)

## 2023-05-20 LAB — GLUCOSE, CAPILLARY
Glucose-Capillary: 207 mg/dL — ABNORMAL HIGH (ref 70–99)
Glucose-Capillary: 172 mg/dL — ABNORMAL HIGH (ref 70–99)
Glucose-Capillary: 250 mg/dL — ABNORMAL HIGH (ref 70–99)
Glucose-Capillary: 188 mg/dL — ABNORMAL HIGH (ref 70–99)

## 2023-05-20 MED ORDER — COLCHICINE 0.6 MG PO TABS
0.6000 mg | ORAL_TABLET | Freq: Every day | ORAL | Status: DC
  Administered 2023-05-20 – 2023-05-21 (×2): 0.6 mg via ORAL
  Filled 2023-05-20 (×2): qty 1

## 2023-05-20 MED ORDER — HYDROMORPHONE HCL 2 MG PO TABS
4.0000 mg | ORAL_TABLET | ORAL | Status: DC | PRN
  Administered 2023-05-20 – 2023-05-21 (×4): 4 mg via ORAL
  Filled 2023-05-20 (×4): qty 2

## 2023-05-20 MED ORDER — HYDROMORPHONE HCL 1 MG/ML IJ SOLN: 1.0000 mg | Freq: Once | INTRAMUSCULAR | Status: DC

## 2023-05-20 MED ORDER — WARFARIN SODIUM 2 MG PO TABS
2.0000 mg | ORAL_TABLET | Freq: Once | ORAL | Status: AC
  Administered 2023-05-20: 2 mg via ORAL
  Filled 2023-05-20: qty 1

## 2023-05-20 MED ORDER — HYDROMORPHONE HCL 1 MG/ML IJ SOLN
1.0000 mg | INTRAMUSCULAR | Status: DC | PRN
  Administered 2023-05-20 – 2023-05-21 (×4): 1 mg via INTRAVENOUS
  Filled 2023-05-20 (×5): qty 1

## 2023-05-20 MED ORDER — CEFDINIR 300 MG PO CAPS
300.0000 mg | ORAL_CAPSULE | Freq: Two times a day (BID) | ORAL | Status: DC
  Administered 2023-05-20 – 2023-05-21 (×4): 300 mg via ORAL
  Filled 2023-05-20 (×5): qty 1

## 2023-05-20 MED ORDER — HYDROMORPHONE HCL 1 MG/ML IJ SOLN: 2.0000 mg | Freq: Once | INTRAMUSCULAR | Status: DC

## 2023-05-20 NOTE — Plan of Care (Signed)
  Problem: Clinical Measurements: Goal: Will remain free from infection Outcome: Not Progressing   Problem: Clinical Measurements: Goal: Diagnostic test results will improve Outcome: Not Progressing   Problem: Activity: Goal: Risk for activity intolerance will decrease Outcome: Not Progressing   Problem: Coping: Goal: Level of anxiety will decrease Outcome: Not Progressing   Problem: Elimination: Goal: Will not experience complications related to bowel motility Outcome: Not Progressing   Problem: Pain Management: Goal: General experience of comfort will improve Outcome: Not Progressing   Problem: Safety: Goal: Ability to remain free from injury will improve Outcome: Not Progressing

## 2023-05-20 NOTE — Progress Notes (Signed)
 PHARMACY - ANTICOAGULATION CONSULT NOTE  Pharmacy Consult for warfarin Indication: atrial fibrillation  Allergies  Allergen Reactions   Latex Other (See Comments)    Welts - cant wear them, but can be touched by someone who wears them.   Penicillins Hives and Itching    Tolerated ceftriaxone  10/2020  facial swelling   Vital Signs: Temp: 98.1 F (36.7 C) (01/05 0837) Temp Source: Oral (01/05 0837) BP: 125/79 (01/05 0837) Pulse Rate: 76 (01/05 0837)  Labs: Recent Labs    05/18/23 0745 05/19/23 0428 05/20/23 0524 05/20/23 0810  HGB 13.0 12.5 12.2 12.7  HCT 39.1 37.5 36.9 38.2  PLT 387 424* 415* 439*  LABPROT 33.7* 34.8* 29.7*  --   INR 3.3* 3.4* 2.8*  --   CREATININE 0.70 0.78  --  0.75    Estimated Creatinine Clearance: 92.1 mL/min (by C-G formula based on SCr of 0.75 mg/dL).   Medical History: Past Medical History:  Diagnosis Date   Abdominal wall abscess 10/15/2020   Abdominal wall cellulitis 10/15/2020   Anxiety    Cellulitis 06/04/2021   Cellulitis of abdominal wall 02/14/2022   CHF (congestive heart failure) (HCC)    Diabetes mellitus    Hypercholesteremia    Hypertension    Obesity    Seizures (HCC)    Stroke [facility])     Assessment: Ketrina Boateng is a 72yoF who presented from home due to cellulitis and possible UTI. Patient is on chronic warfarin for Afib and also Dilantin  for history of seizures. Pharmacy consulted to manage warfarin while inpatient. Patient took warfarin dose 12/29. Most recent Rx 05/10/23 is warfarin 5mg  daily (previous 6mg  tablet discontinued). Called daughter who said patient does her own medications. Daughter tried to review or get a list from patient but hasn't been able to yet so she does not know what medications patient takes. Per patient, 5mg  still caused INR to be high so dose was reduced to 3 point something but she's not exacty sure. Unable to see outpatient warfarin notes.  05/20/2023: INR is therapeutic at 2.8 today after 3  days without a dose. Remains on broad spectrum antibiotics which can lead to increased INR. Phenytoin  resumed from home. Hgb and PLT counts stable today and no new signs of bleeding noted.  25% PO intake documented per chart review. Will hold warfarin today to try to get back into therapeutic range.   Goal of Therapy:  INR 2-3 Monitor platelets by anticoagulation protocol: Yes   Plan:  Give warfarin 2mg  PO  Monitor daily INR, CBC, clinical course, s/sx of bleed, PO intake/diet, and for any new DDIs  Koren Or, PharmD Clinical Pharmacist 05/20/2023 10:25 AM Please check AMION for all [facility] numbers

## 2023-05-20 NOTE — Progress Notes (Signed)
 HD#6 SUBJECTIVE:  Patient Summary: Donna Snow is a 73 y.o. female with pertinent PMH of 2 diabetes on insulin  who presented with left leg pain and is admitted for cellulitis.   Overnight Events: None  Interim History:   Pt seen bedside this AM. She states that the pain in her left knee is around the same, even with the colchicine , and is requesting more medicine to help her ease the pain.   OBJECTIVE:  Vital Signs: Vitals:   05/20/23 0500 05/20/23 0530 05/20/23 0837 05/20/23 1511  BP:  (!) 140/48 125/79 123/75  Pulse:  88 76 77  Resp:  18  18  Temp:  98 F (36.7 C) 98.1 F (36.7 C) 98.2 F (36.8 C)  TempSrc:   Oral Oral  SpO2:  94% 94% 96%  Weight: (!) 144.3 kg     Height:       Supplemental O2: Room Air SpO2: 96 % O2 Flow Rate (L/min): 2 L/min  Filed Weights   05/18/23 0500 05/19/23 0455 05/20/23 0500  Weight: (!) 142.8 kg (!) 144.3 kg (!) 144.3 kg     Intake/Output Summary (Last 24 hours) at 05/20/2023 1946 Last data filed at 05/20/2023 1707 Gross per 24 hour  Intake 240 ml  Output 1200 ml  Net -960 ml   Net IO Since Admission: -2,364.86 mL [05/20/23 1946]  Physical Exam: Physical Exam Constitutional:      General: She is not in acute distress.    Appearance: She is obese. She is ill-appearing.  HENT:     Nose: Nose normal.  Cardiovascular:     Rate and Rhythm: Normal rate.     Pulses: Normal pulses.  Pulmonary:     Effort: Pulmonary effort is normal.     Breath sounds: No wheezing or rales.  Abdominal:     General: Abdomen is flat. There is no distension.     Tenderness: There is no abdominal tenderness.  Musculoskeletal:        General: Swelling and tenderness present.     Right lower leg: No edema.     Left lower leg: Edema present.     Comments: [name], appears to be improving. Severe tenderness to palpation in L knee. Very limited ROM of L leg.  Skin:    General: Skin is warm and dry.     Findings: Rash present.  Neurological:      General: No focal deficit present.     Mental Status: She is alert.  Psychiatric:        Mood and Affect: Mood normal.        Behavior: Behavior normal.   Patient Lines/Drains/Airways Status     Active Line/Drains/Airways     Name Placement date Placement time Site Days   Peripheral IV 05/14/23 20 G 1.88 Left;Posterior Forearm 05/14/23  2333  Forearm  4             ASSESSMENT/PLAN:  Assessment: Principal Problem:   Cellulitis of left lower extremity from knee to ankle Active Problems:   Hyperglycemia due to diabetes mellitus (HCC)   Type 2 diabetes mellitus (HCC)   AF (paroxysmal atrial fibrillation) (HCC)   Chronic anticoagulation - on coumadin  for afib   OSA on CPAP   GAD (generalized anxiety disorder)   Sepsis without acute organ dysfunction (HCC)   Chronic constipation   Pseudogout   Hyperglycemia   Dog scratch   Hx of penicillin allergy   Lower extremity edema  Donna Snow  is a 73 y.o. female with pertinent PMH of T2 diabetes on insulin  who presented with left leg pain and is admitted for cellulitis.    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 I don't believe these findings are suggestive of septic arthritis, and I would've expected pain to improve slightly at least with aspiration. Findings did also show calcium  pyrophosphate crystals, which is consistent with pseudogout which could explain her pain.  Unforutnately, colchicine  did not work for pain, but I think this will take a couple more days. In the meawhile, will add IV dilaudid  to her pain regimen PRN.   - Appreciate IR and Ortho evaluations  - Continue IV antibiotics and pain control. - Stopped Zyvoc and changed ceftriaxone  to cefdinir  per ID - Continue colchicine  - For pain  Tylenol  1000 every 8 hours, Dilaudid  .5mg  IV PRN, Dilaudid  4 mg every 4h as needed. Bowel regimen: Miralax  BID, Senna-docusate BID, fleet enema prn. - Trend white count and blood cultures   T2DM AGMA - possible mild DKA (resolved) Mild AG resolved with insulin . Most recent A1c is 7.4%. Takes Humulin R  11-20 units TID before meals, typically 300 units daily at home. She has some stomach upset but it is ongoing for several days and likely related to her cellulitis. She was given an injection of methylprednisone yesterday for her aspiration, which could explain her elevated Glucose. Will monitor throughout the day and increase sliding scale/LA insulin  as needed.  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  Often needs fleet enemas in order to have BM. Has bedside commode. - Senna-docusate 1 tablet oral BID, Miralax  BID, Fleet enema prn     Dyspnea associated with Anxiety and OSA Intermittent dyspnea but no hypoxia. CPAP provided at that time and resolved the issue. Upon dyspnea, attempt reassurance about overall plan, will evaluate need for anxiolytics, oxygen available for comfort. - Continue home Buspar  20mg  TID. Atarax  50mg    Ambulatory dysfunction due to severe pain Some difficulty with walking, getting to toilet. RN assisting. PT consult.   Paroxysmal Afib Rate/Rhythm controlled without medicine. She takes warfarin outpatient. Will continue here per pharmacy.   Chronic HFpEF without exacerbation - Resume lasix  60 every day (home dose is BID). Hold spironolactone  12.5mg  every day, losartan  50mg  every day in setting of low normal Bps. Check daily weights.   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  Will continue topiramate  200mg  po BID, Phenytoin  400mg  po qd. HLD - Continue home rosuvastatin  40mg  every day and zetia  10mg  every day   GERD - Continue home Protonix  40mg  qd Night Terrors - Home prazosin  1mg  am and 2mg  at bedtime Insomnia - Trazodone  200 at bedtime, home dose is 300mg  HTN - Holdng home medicines per above   Best Practice: Diet: Regular diet IVF: none VTE: warfarin Code: Full AB: Zyvox  and Cefdinir  Therapy Recs: PT to see DISPO: Anticipated discharge in 2-3 days to Home pending  pain/infection control .  Signature: Tyri Elmore, MD Internal Medicine Resident, PGY-2 [facility]  Pager: [phone] 7:46 PM, 05/20/2023   Please contact the on call pager after 5 pm and on weekends at [phone].

## 2023-05-20 NOTE — Plan of Care (Signed)
  Problem: Education: Goal: Knowledge of General Education information will improve Description: Including pain rating scale, medication(s)/side effects and non-pharmacologic comfort measures Outcome: Progressing   Problem: Health Behavior/Discharge Planning: Goal: Ability to manage health-related needs will improve Outcome: Progressing   Problem: Pain Management: Goal: General experience of comfort will improve Outcome: Progressing

## 2023-05-20 NOTE — Progress Notes (Signed)
 Subjective: No new complaints   Antibiotics:  Anti-infectives (From admission, onward)    Start     Dose/Rate Route Frequency Ordered Stop   05/20/23 1430  cefdinir  (OMNICEF ) capsule 300 mg        300 mg Oral Every 12 hours 05/20/23 1330     05/19/23 1330  linezolid  (ZYVOX ) tablet 600 mg        600 mg Oral Every 12 hours 05/19/23 1243     05/18/23 2200  vancomycin  (VANCOREADY) IVPB 750 mg/150 mL  Status:  Discontinued        750 mg 150 mL/hr over 60 Minutes Intravenous Every 12 hours 05/18/23 1714 05/18/23 1736   05/18/23 2200  vancomycin  (VANCOCIN ) 750 mg in sodium chloride  0.9 % 250 mL IVPB  Status:  Discontinued       Note to Pharmacy: Indication: Bacteremia   750 mg 265 mL/hr over 60 Minutes Intravenous Every 12 hours 05/18/23 1736 05/19/23 1243   05/18/23 1645  cefTRIAXone  (ROCEPHIN ) 2 g in sodium chloride  0.9 % 100 mL IVPB  Status:  Discontinued        2 g 200 mL/hr over 30 Minutes Intravenous Every 24 hours 05/18/23 1552 05/20/23 1330   05/17/23 2200  ceFEPIme  (MAXIPIME ) 2 g in sodium chloride  0.9 % 100 mL IVPB  Status:  Discontinued        2 g 200 mL/hr over 30 Minutes Intravenous Every 8 hours 05/17/23 2150 05/18/23 1551   05/14/23 2200  vancomycin  (VANCOCIN ) 750 mg in sodium chloride  0.9 % 250 mL IVPB  Status:  Discontinued        750 mg 250 mL/hr over 60 Minutes Intravenous Every 12 hours 05/14/23 1226 05/18/23 1551   05/13/23 2200  ceFEPIme  (MAXIPIME ) 2 g in sodium chloride  0.9 % 100 mL IVPB  Status:  Discontinued        2 g 200 mL/hr over 30 Minutes Intravenous Every 12 hours 05/13/23 1445 05/17/23 2150   05/13/23 2200  vancomycin  (VANCOREADY) IVPB 750 mg/150 mL  Status:  Discontinued        750 mg 150 mL/hr over 60 Minutes Intravenous Every 12 hours 05/13/23 1447 05/14/23 1226   05/13/23 1045  ceFEPIme  (MAXIPIME ) 2 g in sodium chloride  0.9 % 100 mL IVPB        2 g 200 mL/hr over 30 Minutes Intravenous  Once 05/13/23 1031 05/13/23 1121   05/13/23 1045   metroNIDAZOLE  (FLAGYL ) IVPB 500 mg        500 mg 100 mL/hr over 60 Minutes Intravenous  Once 05/13/23 1031 05/13/23 1347   05/13/23 1045  vancomycin  (VANCOCIN ) IVPB 1000 mg/200 mL premix        1,000 mg 200 mL/hr over 60 Minutes Intravenous  Once 05/13/23 1031 05/13/23 1518       Medications: Scheduled Meds:  acetaminophen   1,000 mg Oral Q6H   busPIRone   20 mg Oral TID   cefdinir   300 mg Oral Q12H   colchicine   0.6 mg Oral Daily   ezetimibe   10 mg Oral Daily   FLUoxetine   60 mg Oral Daily   furosemide   60 mg Oral Daily   insulin  aspart  0-20 Units Subcutaneous TID WC   insulin  aspart  0-5 Units Subcutaneous QHS   insulin  glargine-yfgn  24 Units Subcutaneous BID   linezolid   600 mg Oral Q12H   nystatin    Topical BID   pantoprazole   40 mg Oral BID  phenytoin   400 mg Oral Daily   polyethylene glycol  17 g Oral BID   prazosin   1 mg Oral q AM   And   prazosin   2 mg Oral QHS   rosuvastatin   40 mg Oral Daily   senna-docusate  2 tablet Oral Daily   topiramate   200 mg Oral BID   warfarin  2 mg Oral ONCE-1600   Warfarin - Pharmacist Dosing Inpatient   Does not apply q1600   Continuous Infusions:   PRN Meds:.HYDROmorphone  (DILAUDID ) injection, HYDROmorphone , hydrOXYzine , ondansetron  (ZOFRAN ) IV, sodium chloride , trazodone     Objective: Weight change: 0 kg  Intake/Output Summary (Last 24 hours) at 05/20/2023 1331 Last data filed at 05/20/2023 1100 Gross per 24 hour  Intake 340 ml  Output 500 ml  Net -160 ml   Blood pressure 125/79, pulse 76, temperature 98.1 F (36.7 C), temperature source Oral, resp. rate 18, height 5' 4 (1.626 m), weight (!) 144.3 kg, SpO2 94%. Temp:  [97.9 F (36.6 C)-98.1 F (36.7 C)] 98.1 F (36.7 C) (01/05 0837) Pulse Rate:  [76-88] 76 (01/05 0837) Resp:  [18] 18 (01/05 0530) BP: (125-140)/(48-79) 125/79 (01/05 0837) SpO2:  [94 %-97 %] 94 % (01/05 0837) Weight:  [144.3 kg] 144.3 kg (01/05 0500)  Physical Exam: Physical Exam Constitutional:       General: She is not in acute distress.    Appearance: She is well-developed. She is not diaphoretic.  HENT:     Head: Normocephalic and atraumatic.     Right Ear: External ear normal.     Left Ear: External ear normal.     Mouth/Throat:     Pharynx: No oropharyngeal exudate.  Eyes:     General: No scleral icterus.    Conjunctiva/sclera: Conjunctivae normal.     Pupils: Pupils are equal, round, and reactive to light.  Cardiovascular:     Rate and Rhythm: Normal rate and regular rhythm.  Pulmonary:     Effort: Pulmonary effort is normal. No respiratory distress.     Breath sounds: No wheezing.  Abdominal:     General: Bowel sounds are normal. There is no distension.     Palpations: Abdomen is soft.  Musculoskeletal:        General: No tenderness. Normal range of motion.  Lymphadenopathy:     Cervical: No cervical adenopathy.  Skin:    General: Skin is warm and dry.     Coloration: Skin is not pale.     Findings: Erythema present. No rash.  Neurological:     General: No focal deficit present.     Mental Status: She is alert and oriented to person, place, and time.     Motor: No abnormal muscle tone.  Psychiatric:        Mood and Affect: Mood normal.        Behavior: Behavior normal.        Thought Content: Thought content normal.        Judgment: Judgment normal.     Leg 1/2:    1/4    1/5    CBC:    BMET Recent Labs    05/19/23 0428 05/20/23 0810  NA 133* 135  K 3.4* 3.4*  CL 97* 97*  CO2 26 25  GLUCOSE 217* 196*  BUN 9 8  CREATININE 0.78 0.75  CALCIUM  8.3* 8.6*     Liver Panel  No results for input(s): PROT, ALBUMIN, AST, ALT, ALKPHOS, BILITOT, BILIDIR, IBILI in the last 72 hours.  Sedimentation Rate Recent Labs    05/19/23 0929  ESRSEDRATE 117*   C-Reactive Protein Recent Labs    05/19/23 0929  CRP 32.2*    Micro Results: Recent Results (from the past 720 hours)  Blood Culture (routine x 2)      Status: None   Collection Time: 05/13/23 10:10 AM   Specimen: BLOOD RIGHT HAND  Result Value Ref Range Status   Specimen Description BLOOD RIGHT HAND  Final   Special Requests   Final    BOTTLES DRAWN AEROBIC AND ANAEROBIC Blood Culture results may not be optimal due to an inadequate volume of blood received in culture bottles   Culture  Setup Time   Final    GRAM POSITIVE COCCI IN CLUSTERS AEROBIC BOTTLE ONLY CRITICAL RESULT CALLED TO, READ BACK BY AND VERIFIED WITH: PHARMD J. LEDFORD 05/15/2023 @ 2218 BY AB    Culture   Final    KOCURIA RHIZOPHILA Standardized susceptibility testing for this organism is not available. Performed at [facility], [address] [place], [place] [address]    Report Status 05/17/2023 FINAL  Final  Blood Culture (routine x 2)     Status: None   Collection Time: 05/13/23 10:10 AM   Specimen: BLOOD RIGHT HAND  Result Value Ref Range Status   Specimen Description BLOOD RIGHT HAND  Final   Special Requests   Final    BOTTLES DRAWN AEROBIC AND ANAEROBIC Blood Culture adequate volume   Culture   Final    NO GROWTH 5 DAYS Performed at [facility], [address], [place], [place] [address]    Report Status 05/18/2023 FINAL  Final  Blood Culture ID Panel (Reflexed)     Status: None   Collection Time: 05/13/23 10:10 AM  Result Value Ref Range Status   Enterococcus faecalis NOT DETECTED NOT DETECTED Final   Enterococcus Faecium NOT DETECTED NOT DETECTED Final   Listeria monocytogenes NOT DETECTED NOT DETECTED Final   Staphylococcus species NOT DETECTED NOT DETECTED Final   Staphylococcus aureus (BCID) NOT DETECTED NOT DETECTED Final   Staphylococcus epidermidis NOT DETECTED NOT DETECTED Final   Staphylococcus lugdunensis NOT DETECTED NOT DETECTED Final   Streptococcus species NOT DETECTED NOT DETECTED Final   Streptococcus agalactiae NOT DETECTED NOT DETECTED Final   Streptococcus pneumoniae NOT DETECTED NOT DETECTED Final   Streptococcus  pyogenes NOT DETECTED NOT DETECTED Final   A.calcoaceticus-baumannii NOT DETECTED NOT DETECTED Final   Bacteroides fragilis NOT DETECTED NOT DETECTED Final   Enterobacterales NOT DETECTED NOT DETECTED Final   Enterobacter cloacae complex NOT DETECTED NOT DETECTED Final   Escherichia coli NOT DETECTED NOT DETECTED Final   Klebsiella aerogenes NOT DETECTED NOT DETECTED Final   Klebsiella oxytoca NOT DETECTED NOT DETECTED Final   Klebsiella pneumoniae NOT DETECTED NOT DETECTED Final   Proteus species NOT DETECTED NOT DETECTED Final   Salmonella species NOT DETECTED NOT DETECTED Final   Serratia marcescens NOT DETECTED NOT DETECTED Final   Haemophilus influenzae NOT DETECTED NOT DETECTED Final   Neisseria meningitidis NOT DETECTED NOT DETECTED Final   Pseudomonas aeruginosa NOT DETECTED NOT DETECTED Final   Stenotrophomonas maltophilia NOT DETECTED NOT DETECTED Final   Candida albicans NOT DETECTED NOT DETECTED Final   Candida auris NOT DETECTED NOT DETECTED Final   Candida glabrata NOT DETECTED NOT DETECTED Final   Candida krusei NOT DETECTED NOT DETECTED Final   Candida parapsilosis NOT DETECTED NOT DETECTED Final   Candida tropicalis NOT DETECTED NOT DETECTED  Final   Cryptococcus neoformans/gattii NOT DETECTED NOT DETECTED Final    Comment: Performed at [facility], [address], [place], [place] [address]  Body fluid culture w Gram Stain     Status: None (Preliminary result)   Collection Time: 05/18/23 12:53 PM   Specimen: Synovium; Body Fluid  Result Value Ref Range Status   Specimen Description SYNOVIAL  Final   Special Requests KNEE  Final   Gram Stain NO WBC SEEN NO ORGANISMS SEEN   Final   Culture   Final    NO GROWTH 2 DAYS Performed at [facility], [address] [place], [place] [address]    Report Status PENDING  Incomplete  Culture, blood (Routine X 2) w Reflex to ID Panel     Status: None (Preliminary result)   Collection Time: 05/18/23  6:20 PM    Specimen: BLOOD  Result Value Ref Range Status   Specimen Description BLOOD BLOOD RIGHT ARM  Final   Special Requests   Final    BOTTLES DRAWN AEROBIC AND ANAEROBIC Blood Culture results may not be optimal due to an inadequate volume of blood received in culture bottles   Culture   Final    NO GROWTH 2 DAYS Performed at [facility], [address] [place], [place] [address]    Report Status PENDING  Incomplete  Culture, blood (Routine X 2) w Reflex to ID Panel     Status: None (Preliminary result)   Collection Time: 05/18/23  6:20 PM   Specimen: BLOOD  Result Value Ref Range Status   Specimen Description BLOOD BLOOD LEFT HAND  Final   Special Requests   Final    BOTTLES DRAWN AEROBIC AND ANAEROBIC Blood Culture results may not be optimal due to an inadequate volume of blood received in culture bottles   Culture   Final    NO GROWTH 2 DAYS Performed at [facility], [address], [place], [place] [address]    Report Status PENDING  Incomplete    Studies/Results: ECHOCARDIOGRAM COMPLETE Result Date: 05/19/2023    ECHOCARDIOGRAM REPORT   Patient Name:   Donna Snow Date of Exam: 05/19/2023 Medical Rec #:  [id number]    Height:       64.0 in Accession #:    [id number]   Weight:       318.1 lb Date of Birth:  18-Jul-1950    BSA:          2.383 m Patient Age:    72 years     BP:           140/67 mmHg Patient Gender: F            HR:           83 bpm. Exam Location:  Inpatient Procedure: 2D Echo, Cardiac Doppler, Color Doppler and Intracardiac            Opacification Agent Indications:    Bacteremia R78.81  History:        Patient has prior history of Echocardiogram examinations, most                 recent 02/14/2022. CHF, Stroke, Arrythmias:Atrial Fibrillation;                 Risk Factors:Hypertension, Sleep Apnea, Diabetes and                 Dyslipidemia.  Sonographer:    Thea Norlander RCS  Referring Phys: VAN DAM, [place] Year[place], N  Sonographer Comments: Patient is obese.  IMPRESSIONS  1. Left ventricular ejection fraction, by estimation, is 55 to 60%. The left ventricle has normal function. Left ventricular endocardial border not optimally defined to evaluate regional wall motion. There is mild concentric left ventricular hypertrophy. Left ventricular diastolic parameters are consistent with Grade I diastolic dysfunction (impaired relaxation).  2. Right ventricular systolic function was not well visualized. The right ventricular size is normal. Tricuspid regurgitation signal is inadequate for assessing PA pressure.  3. The mitral valve is grossly normal. Trivial mitral valve regurgitation.  4. The aortic valve is tricuspid. There is mild calcification of the aortic valve. Aortic valve regurgitation is trivial. Aortic valve sclerosis is present, with no evidence of aortic valve stenosis. Aortic valve mean gradient measures 7.0 mmHg.  5. The inferior vena cava is normal in size with <50% respiratory variability, suggesting right atrial pressure of 8 mmHg. Comparison(s): Prior images reviewed side by side. LVEF remains normal range at 55-60%. FINDINGS  Left Ventricle: Left ventricular ejection fraction, by estimation, is 55 to 60%. The left ventricle has normal function. Left ventricular endocardial border not optimally defined to evaluate regional wall motion. Definity  contrast agent was given IV to delineate the left ventricular endocardial borders. The left ventricular internal cavity size was normal in size. There is mild concentric left ventricular hypertrophy. Left ventricular diastolic parameters are consistent with Grade I diastolic dysfunction (impaired relaxation). Right Ventricle: The right ventricular size is normal. Right vetricular wall thickness was not well visualized. Right ventricular systolic function was not well visualized. Tricuspid regurgitation signal is inadequate for assessing PA pressure. Left Atrium: Left atrial size was normal in size. Right Atrium: Right  atrial size was normal in size. Pericardium: There is no evidence of pericardial effusion. Mitral Valve: The mitral valve is grossly normal. Trivial mitral valve regurgitation. Tricuspid Valve: The tricuspid valve is grossly normal. Tricuspid valve regurgitation is trivial. Aortic Valve: The aortic valve is tricuspid. There is mild calcification of the aortic valve. There is mild aortic valve annular calcification. Aortic valve regurgitation is trivial. Aortic valve sclerosis is present, with no evidence of aortic valve stenosis. Aortic valve mean gradient measures 7.0 mmHg. Aortic valve peak gradient measures 14.3 mmHg. Aortic valve area, by VTI measures 1.71 cm. Pulmonic Valve: The pulmonic valve was not well visualized. Pulmonic valve regurgitation is trivial. Aorta: The aortic root and ascending aorta are structurally normal, with no evidence of dilitation. Venous: The inferior vena cava is normal in size with less than 50% respiratory variability, suggesting right atrial pressure of 8 mmHg. IAS/Shunts: No atrial level shunt detected by color flow Doppler.  LEFT VENTRICLE PLAX 2D LVIDd:         5.10 cm   Diastology LVIDs:         3.60 cm   LV e' medial:    8.16 cm/s LV PW:         1.40 cm   LV E/e' medial:  8.8 LV IVS:        0.90 cm   LV e' lateral:   9.46 cm/s LVOT diam:     2.10 cm   LV E/e' lateral: 7.6 LV SV:         54 LV SV Index:   23 LVOT Area:     3.46 cm  RIGHT VENTRICLE             IVC RV S prime:     14.40 cm/s  IVC diam: 2.00 cm TAPSE (M-mode): 2.2 cm LEFT ATRIUM             Index        RIGHT ATRIUM           Index LA diam:        4.80 cm 2.01 cm/m   RA Area:     14.40 cm LA Vol (A2C):   59.6 ml 25.01 ml/m  RA Volume:   32.40 ml  13.60 ml/m LA Vol (A4C):   61.6 ml 25.85 ml/m LA Biplane Vol: 59.8 ml 25.09 ml/m  AORTIC VALVE AV Area (Vmax):    1.69 cm AV Area (Vmean):   1.73 cm AV Area (VTI):     1.71 cm AV Vmax:           189.00 cm/s AV Vmean:          123.000 cm/s AV VTI:             0.314 m AV Peak Grad:      14.3 mmHg AV Mean Grad:      7.0 mmHg LVOT Vmax:         92.20 cm/s LVOT Vmean:        61.300 cm/s LVOT VTI:          0.155 m LVOT/AV VTI ratio: 0.49  AORTA Ao Root diam: 2.70 cm Ao Asc diam:  3.10 cm MITRAL VALVE MV Area (PHT): 3.91 cm     SHUNTS MV Decel Time: 194 msec     Systemic VTI:  0.16 m MV E velocity: 71.80 cm/s   Systemic Diam: 2.10 cm MV A velocity: 106.00 cm/s MV E/A ratio:  0.68 Jayson Sierras MD Electronically signed by Jayson Sierras MD Signature Date/Time: 05/19/2023/3:45:09 PM    Final       Assessment/Plan:  INTERVAL HISTORY: Punch biopsies performed yesterday  Principal Problem:   Cellulitis of left lower extremity from knee to ankle Active Problems:   Hyperglycemia due to diabetes mellitus (HCC)   Type 2 diabetes mellitus (HCC)   AF (paroxysmal atrial fibrillation) (HCC)   Chronic anticoagulation - on coumadin  for afib   OSA on CPAP   GAD (generalized anxiety disorder)   Sepsis without acute organ dysfunction (HCC)   Chronic constipation   Pseudogout   Hyperglycemia    Donna Snow is a 73 y.o. female with history of atrial fibrillation on Coumadin  asthma hyperlipidemia prior stroke seizures depression who sustained a scratch to her lower extremities of a dog and developed erythema proximal and scratch that worsened in the 2 to 3 days prior to mission to [facility] she also has had pain in her knees bilateral particular the left side there was concern for septic arthritis.  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 Cellulitis:  I am becoming more and  more skeptical about this  She has actually had now had 8 days of IV antibiotics active vs pasteurella and similarly 8 days of drugs active vs MRSA (which I have zero suspicion of) and the Kocuria which is likely a contaminant in her  1/2 blood cultures  Going to discontinue Zyvox  after her next dose.  I am discontinuing her ceftriaxone  and changing her over to cefdinir  for today  Today I performed punch biopsies 1 which will be examined by pathology and another which will be sent for AFB and fungal cultures.  I will likely DC all of her antibiotics tomorrow though would like to do an amoxicillin  challenge  #2 Kocuria in 1/2 blood cultures = contaminant but was covered regardless with vanco, zyvox  and possibly the CTX too  Elevate leg--difficult with her knee pain  3. Allergy will perform amoxicillin  challenge on Monday  4.  Pseudogout of the left knee  He felt better after an aspirate  Perhaps a separate aspirate might help corticosteroid injection might help though there might still be concern for infection I am becoming more skeptical of this  5 edema of LE: Defer to primary team with regards to management of this this seems to be along with her knee pain one of the limiting factors her becoming mobile which she will need to be able to do to get home      LOS: 6 days   Donna Snow 05/20/2023, 1:31 PM

## 2023-05-21 DIAGNOSIS — R600 Localized edema: Secondary | ICD-10-CM | POA: Diagnosis not present

## 2023-05-21 DIAGNOSIS — A419 Sepsis, unspecified organism: Secondary | ICD-10-CM | POA: Diagnosis not present

## 2023-05-21 DIAGNOSIS — Z7901 Long term (current) use of anticoagulants: Secondary | ICD-10-CM

## 2023-05-21 DIAGNOSIS — L03116 Cellulitis of left lower limb: Secondary | ICD-10-CM | POA: Diagnosis not present

## 2023-05-21 DIAGNOSIS — M1120 Other chondrocalcinosis, unspecified site: Secondary | ICD-10-CM

## 2023-05-21 DIAGNOSIS — E1165 Type 2 diabetes mellitus with hyperglycemia: Secondary | ICD-10-CM | POA: Diagnosis not present

## 2023-05-21 LAB — BODY FLUID CULTURE W GRAM STAIN: Gram Stain: NONE SEEN

## 2023-05-21 LAB — ANCA PROFILE
Anti-MPO Antibodies: <0.2 U (ref 0.0–0.9)
Anti-PR3 Antibodies: <0.2 U (ref 0.0–0.9)
Atypical P-ANCA titer: <1:20 {titer}
C-ANCA: <1:20 {titer}
P-ANCA: <1:20 {titer}

## 2023-05-21 LAB — CBC
HCT: 36.6 % (ref 36.0–46.0)
Hemoglobin: 11.9 g/dL — ABNORMAL LOW (ref 12.0–15.0)
MCH: 27.8 pg (ref 26.0–34.0)
MCHC: 32.5 g/dL (ref 30.0–36.0)
MCV: 85.5 fL (ref 80.0–100.0)
Platelets: 432 10*3/uL — ABNORMAL HIGH (ref 150–400)
RBC: 4.28 MIL/uL (ref 3.87–5.11)
RDW: 13.3 % (ref 11.5–15.5)
WBC: 9.8 10*3/uL (ref 4.0–10.5)
nRBC: 0 % (ref 0.0–0.2)

## 2023-05-21 LAB — PROTIME-INR
INR: 2.4 — ABNORMAL HIGH (ref 0.8–1.2)
Prothrombin Time: 26 s — ABNORMAL HIGH (ref 11.4–15.2)

## 2023-05-21 LAB — ACID FAST SMEAR (AFB, MYCOBACTERIA): Acid Fast Smear: NEGATIVE

## 2023-05-21 LAB — PHOSPHORUS: Phosphorus: 3 mg/dL (ref 2.5–4.6)

## 2023-05-21 LAB — GLUCOSE, CAPILLARY
Glucose-Capillary: 168 mg/dL — ABNORMAL HIGH (ref 70–99)
Glucose-Capillary: 224 mg/dL — ABNORMAL HIGH (ref 70–99)
Glucose-Capillary: 170 mg/dL — ABNORMAL HIGH (ref 70–99)
Glucose-Capillary: 147 mg/dL — ABNORMAL HIGH (ref 70–99)

## 2023-05-21 LAB — C-REACTIVE PROTEIN: CRP: 21 mg/dL — ABNORMAL HIGH (ref <1.0)

## 2023-05-21 LAB — MAGNESIUM: Magnesium: 2.1 mg/dL (ref 1.7–2.4)

## 2023-05-21 LAB — SEDIMENTATION RATE: Sed Rate: 121 mm/h — ABNORMAL HIGH (ref 0–22)

## 2023-05-21 MED ORDER — DICLOFENAC SODIUM 1 % EX GEL
4.0000 g | Freq: Four times a day (QID) | CUTANEOUS | Status: DC
  Administered 2023-05-21 – 2023-05-29 (×32): 4 g via TOPICAL
  Filled 2023-05-21: qty 100

## 2023-05-21 MED ORDER — EPINEPHRINE 0.3 MG/0.3ML IJ SOAJ
0.3000 mg | Freq: Once | INTRAMUSCULAR | Status: DC | PRN
  Filled 2023-05-21: qty 0.3

## 2023-05-21 MED ORDER — HYDROMORPHONE HCL 2 MG PO TABS
4.0000 mg | ORAL_TABLET | ORAL | Status: DC | PRN
  Administered 2023-05-21 – 2023-05-24 (×13): 4 mg via ORAL
  Filled 2023-05-21 (×14): qty 2

## 2023-05-21 MED ORDER — HYDROMORPHONE HCL 1 MG/ML IJ SOLN
1.0000 mg | INTRAMUSCULAR | Status: DC | PRN
  Administered 2023-05-21 – 2023-05-22 (×2): 2 mg via INTRAVENOUS
  Administered 2023-05-22: 1 mg via INTRAVENOUS
  Administered 2023-05-22: 2 mg via INTRAVENOUS
  Administered 2023-05-23 – 2023-05-24 (×4): 1 mg via INTRAVENOUS
  Filled 2023-05-21 (×3): qty 1
  Filled 2023-05-21: qty 2
  Filled 2023-05-21 (×2): qty 1
  Filled 2023-05-21 (×2): qty 2

## 2023-05-21 MED ORDER — COLCHICINE 0.6 MG PO TABS
0.6000 mg | ORAL_TABLET | Freq: Two times a day (BID) | ORAL | Status: DC
  Administered 2023-05-21 – 2023-05-29 (×16): 0.6 mg via ORAL
  Filled 2023-05-21 (×17): qty 1

## 2023-05-21 MED ORDER — DICLOFENAC SODIUM 1 % EX GEL: 4.0000 g | Freq: Four times a day (QID) | CUTANEOUS | Status: DC | PRN

## 2023-05-21 MED ORDER — WARFARIN SODIUM 3 MG PO TABS
3.0000 mg | ORAL_TABLET | Freq: Once | ORAL | Status: AC
  Administered 2023-05-21: 3 mg via ORAL
  Filled 2023-05-21: qty 1

## 2023-05-21 MED ORDER — DICLOFENAC SODIUM 1 % EX GEL
2.0000 g | Freq: Four times a day (QID) | CUTANEOUS | Status: DC | PRN
  Administered 2023-05-21 (×2): 2 g via TOPICAL
  Filled 2023-05-21: qty 100

## 2023-05-21 MED ORDER — DIPHENHYDRAMINE HCL 50 MG/ML IJ SOLN: 25.0000 mg | Freq: Once | INTRAMUSCULAR | Status: DC | PRN

## 2023-05-21 MED ORDER — HYDROMORPHONE HCL 1 MG/ML IJ SOLN
1.0000 mg | Freq: Once | INTRAMUSCULAR | Status: AC
  Administered 2023-05-21: 1 mg via INTRAVENOUS
  Filled 2023-05-21: qty 1

## 2023-05-21 MED ORDER — TIZANIDINE HCL 4 MG PO TABS
4.0000 mg | ORAL_TABLET | Freq: Three times a day (TID) | ORAL | Status: DC
  Administered 2023-05-21 – 2023-05-29 (×25): 4 mg via ORAL
  Filled 2023-05-21 (×25): qty 1

## 2023-05-21 MED ORDER — AMOXICILLIN 500 MG PO CAPS
500.0000 mg | ORAL_CAPSULE | Freq: Once | ORAL | Status: AC
  Administered 2023-05-21: 500 mg via ORAL
  Filled 2023-05-21 (×2): qty 1

## 2023-05-21 MED ORDER — FLUOXETINE HCL 20 MG PO CAPS
60.0000 mg | ORAL_CAPSULE | Freq: Every day | ORAL | Status: DC
  Administered 2023-05-22 – 2023-05-29 (×8): 60 mg via ORAL
  Filled 2023-05-21 (×8): qty 3

## 2023-05-21 NOTE — Progress Notes (Signed)
 Physical Therapy Treatment  Patient Details Name: Donna Snow MRN: [id number] DOB: 04/04/1951 Today's Date: 05/21/2023   History of Present Illness Pt is a 73 y/o female who presents 05/13/2023 with LLE cellulitis. PMH significant for Abdominal wall abscess and cellulitis, anxiety, CHF, DM, HTN, obesity, seizures, stroke, TKA.    PT Comments  Pt progressing slowly towards physical therapy goals. Pt requires increased encouragement to participate and pt's fear of pain/anxiety is limiting PT sessions. At this time tolerance for functional activity is low, and pt will require close supervision for safety upon return home for any OOB mobility. Recommend post-acute rehab <3 hours/day to maximize functional independence, safety, and return to PLOF.    If plan is discharge home, recommend the following: Two people to help with walking and/or transfers;A lot of help with bathing/dressing/bathroom;Assistance with cooking/housework;Assist for transportation;Help with stairs or ramp for entrance   Can travel by private vehicle     No  Equipment Recommendations  BSC/3in1    Recommendations for Other Services       Precautions / Restrictions Precautions Precautions: Fall Restrictions Weight Bearing Restrictions Per Provider Order: No     Mobility  Bed Mobility Overal bed mobility: Needs Assistance Bed Mobility: Supine to Sit     Supine to sit: Mod assist, +2 for physical assistance, +2 for safety/equipment, Used rails, HOB elevated     General bed mobility comments: HOB almost fully elevated. Heavy assist and cues for transition to EOB. Bed pad utilized to assist with scooting around to EOB. Pt was able to scoot up towards [facility] at end of session without assist using headboard    Transfers Overall transfer level: Needs assistance Equipment used: Rolling walker (2 wheels) Transfers: Sit to/from Stand, Bed to chair/wheelchair/BSC Sit to Stand: Mod assist, +2 physical assistance, +2  safety/equipment, From elevated surface           General transfer comment: VC's for hand placement on seated surface for safety. Assist for power up to full stand and to gain/maintain standing balance.    Ambulation/Gait             Pre-gait activities: Pt was able to take a few lateral steps up towards HOB before sitting back down on EOB.     Stairs             Wheelchair Mobility     Tilt Bed    Modified Rankin (Stroke Patients Only)       Balance Overall balance assessment: Needs assistance Sitting-balance support: Feet supported, No upper extremity supported Sitting balance-Leahy Scale: Fair     Standing balance support: Bilateral upper extremity supported, During functional activity, Reliant on assistive device for balance Standing balance-Leahy Scale: Poor                              Cognition Arousal: Alert Behavior During Therapy: Anxious Overall Cognitive Status: Within Functional Limits for tasks assessed                                          Exercises      General Comments        Pertinent Vitals/Pain Pain Assessment Pain Assessment: Faces Faces Pain Scale: Hurts little more Pain Location: L lower leg around the knee. Pain Descriptors / Indicators: Sharp, Grimacing, Guarding, Throbbing Pain Intervention(s): Limited activity within  patient's tolerance, Monitored during session, Repositioned    Home Living                          Prior Function            PT Goals (current goals can now be found in the care plan section) Acute Rehab PT Goals Patient Stated Goal: Decrease pain, go home PT Goal Formulation: With patient Time For Goal Achievement: 05/24/23 Potential to Achieve Goals: Good Progress towards PT goals: Progressing toward goals    Frequency    Min 1X/week      PT Plan      Co-evaluation              AM-PAC PT 6 Clicks Mobility   Outcome Measure   Help needed turning from your back to your side while in a flat bed without using bedrails?: Total Help needed moving from lying on your back to sitting on the side of a flat bed without using bedrails?: Total Help needed moving to and from a bed to a chair (including a wheelchair)?: Total Help needed standing up from a chair using your arms (e.g., wheelchair or bedside chair)?: Total Help needed to walk in hospital room?: Total Help needed climbing 3-5 steps with a railing? : Total 6 Click Score: 6    End of Session Equipment Utilized During Treatment: Gait belt Activity Tolerance: Patient limited by pain Patient left: in bed;with call bell/phone within reach;with bed alarm set;with nursing/sitter in room Nurse Communication: Mobility status;Need for lift equipment PT Visit Diagnosis: Unsteadiness on feet (R26.81);Pain Pain - Right/Left: Left Pain - part of body: Knee;Leg     Time: 1445-1530 PT Time Calculation (min) (ACUTE ONLY): 45 min  Charges:    $Gait Training: 8-22 mins $Therapeutic Activity: 23-37 mins PT General Charges $$ ACUTE PT VISIT: 1 Visit                     Leita Sable, PT, DPT Acute Rehabilitation Services Secure Chat Preferred Office: [phone]    [name] 05/21/2023, 4:16 PM

## 2023-05-21 NOTE — Progress Notes (Signed)
 PHARMACY - ANTICOAGULATION CONSULT NOTE  Pharmacy Consult for warfarin Indication: atrial fibrillation  Allergies  Allergen Reactions   Latex Other (See Comments)    Welts - cant wear them, but can be touched by someone who wears them.   Penicillins Hives and Itching    Tolerated ceftriaxone  10/2020  facial swelling   Vital Signs: Temp: 98.8 F (37.1 C) (01/06 0342) Temp Source: Oral (01/06 0225) BP: 136/58 (01/06 0342) Pulse Rate: 73 (01/06 0342)  Labs: Recent Labs    05/18/23 0745 05/19/23 0428 05/20/23 0524 05/20/23 0810 05/21/23 0500  HGB 13.0 12.5 12.2 12.7 11.9*  HCT 39.1 37.5 36.9 38.2 36.6  PLT 387 424* 415* 439* 432*  LABPROT 33.7* 34.8* 29.7*  --  26.0*  INR 3.3* 3.4* 2.8*  --  2.4*  CREATININE 0.70 0.78  --  0.75  --     Estimated Creatinine Clearance: 92.1 mL/min (by C-G formula based on SCr of 0.75 mg/dL).   Medical History: Past Medical History:  Diagnosis Date   Abdominal wall abscess 10/15/2020   Abdominal wall cellulitis 10/15/2020   Anxiety    Cellulitis 06/04/2021   Cellulitis of abdominal wall 02/14/2022   CHF (congestive heart failure) (HCC)    Diabetes mellitus    Hypercholesteremia    Hypertension    Obesity    Seizures (HCC)    Stroke [facility])     Assessment: Donna Snow is a 72yoF who presented from home due to cellulitis and possible UTI. Patient is on chronic warfarin for Afib and also Dilantin  for history of seizures. Pharmacy consulted to manage warfarin while inpatient. Patient took warfarin dose 12/29. Per patient, 5mg  still caused INR to be high so dose was reduced to ~3mg . Warfarin held 1/2 >>1/4 for supratherapeutic INR.  05/21/23: INR therapeutic at 2.4. Home phenytoin  resumed. CBC stable. 50% meals documented.   Goal of Therapy:  INR 2-3 Monitor platelets by anticoagulation protocol: Yes   Plan:  Warfarin 3mg  this evening Monitor daily INR, CBC, clinical course, s/sx of bleed, PO intake/diet, and for any new  DDIs  Prudence Dollar, PharmD, BCPS 05/21/2023 6:16 AM

## 2023-05-21 NOTE — Progress Notes (Signed)
 Mobility Specialist: Progress Note   05/21/23 1337  Mobility  Range of Motion/Exercises Active Assistive;Active;Right leg;Left leg  Activity Response Tolerated well  Mobility Referral Yes  Mobility visit 1 Mobility  Mobility Specialist Start Time (ACUTE ONLY) 1152  Mobility Specialist Stop Time (ACUTE ONLY) 1214  Mobility Specialist Time Calculation (min) (ACUTE ONLY) 22 min    Pt was agreeable to mobility session - received in bed. Focused on bed level exercises. Pt performed lying marches and leg lifts. Assistance needed with LLE, none on RLE. C/o muscle soreness and fatigue that increased with exercises but still very motivated to complete tasks. Left in bed with all needs met, call bell in reach. RN in room.   Ileana Lute Mobility Specialist Please contact via SecureChat or Rehab office at [phone]

## 2023-05-21 NOTE — Plan of Care (Signed)
  Problem: Health Behavior/Discharge Planning: Goal: Ability to manage health-related needs will improve Outcome: Progressing   Problem: Clinical Measurements: Goal: Cardiovascular complication will be avoided Outcome: Progressing   Problem: Nutrition: Goal: Adequate nutrition will be maintained Outcome: Progressing   Problem: Coping: Goal: Level of anxiety will decrease Outcome: Progressing   Problem: Pain Management: Goal: General experience of comfort will improve Outcome: Progressing

## 2023-05-21 NOTE — Progress Notes (Signed)
 Pharmacy Note- Penicillin Allergy Clarification   ASSESSMENT: Reports happened in 74s. Itching developed into rash. No breathing troubles, no treatment required. Doesn't remember why it was taken.   PEN-FAST Scoring   Five years or less since last reaction 0  Anaphylaxis/Angioedema OR Severe cutaneous adverse reaction  0  Treatment required for reaction  0  Total Score 0 points - Very low risk of positive penicillin allergy test (<1%)      Type of intervention (select all that apply):  Amoxicillin  Oral Challenge  Impact on therapy (select all that apply):  No impact on current therapy, de-labeling penicillin allergy for the future.   PLAN: - amoxicillin  500 mg PO x1  - Q25min vitals monitoring x 1 hour  - Epi-Pen PRN  - IV diphenhydramine  PRN  - plan communicated to primary RN   POST-CHALLENGE FOLLOW-UP: Patient tolerated well without issue. She is aware that she is to be considered NOT penicillin allergic. No questions or concerns at this time.

## 2023-05-21 NOTE — Progress Notes (Signed)
      INFECTIOUS DISEASE ATTENDING ADDENDUM:   Date: 05/21/2023  Patient name: Donna Snow  Medical record number: [id number]  Date of birth: 04-21-1951   Patient tolerated her amoxicillin  challenge without difficulty and no evidence of allergiC reaction after 1 hour of close monitoring I am discontinuing her penicillin allergy.   Our team will sign off for now please call with further questions.  Jomarie Fleeta Rothman 05/21/2023, 5:29 PM

## 2023-05-21 NOTE — Progress Notes (Addendum)
 HD#7 SUBJECTIVE:  Patient Summary: Donna Snow is a 73 y.o. female with pertinent PMH of 2 diabetes on insulin  who presented with left leg pain and is admitted for cellulitis.    Overnight Events: None   Interim History: Pt seen bedside this AM. She states that the pain in her left knee is severe, even with the colchicine , and is requesting more medicine to help her ease the pain. The dilaudid  dose that was helping in previous days is no longer sufficient. She is tearful at times but also appreciative of her caregivers.  OBJECTIVE:  Vital Signs: Vitals:   05/20/23 1511 05/20/23 2045 05/21/23 0225 05/21/23 0342  BP: 123/75 (!) 141/54 128/60 (!) 136/58  Pulse: 77 76 87 73  Resp: 18 19 18 16   Temp: 98.2 F (36.8 C) 98.3 F (36.8 C) 98.7 F (37.1 C) 98.8 F (37.1 C)  TempSrc: Oral  Oral   SpO2: 96% 96% 93% 92%  Weight:      Height:       Supplemental O2: Room Air SpO2: 92 % O2 Flow Rate (L/min): 2 L/min  Filed Weights   05/18/23 0500 05/19/23 0455 05/20/23 0500  Weight: (!) 142.8 kg (!) 144.3 kg (!) 144.3 kg     Intake/Output Summary (Last 24 hours) at 05/21/2023 9047 Last data filed at 05/20/2023 1707 Gross per 24 hour  Intake 240 ml  Output 1200 ml  Net -960 ml   Net IO Since Admission: -2,364.86 mL [05/21/23 0952]  Physical Exam: Physical Exam Constitutional:      General: She is not in acute distress.    Appearance: She is obese. She is ill-appearing.  HENT:     Nose: Nose normal.  Cardiovascular:     Rate and Rhythm: Normal rate.     Pulses: Normal pulses.  Pulmonary:     Effort: Pulmonary effort is normal.     Breath sounds: No wheezing or rales.  Abdominal:     General: Abdomen is flat. There is no distension.     Tenderness: There is no abdominal tenderness.  Musculoskeletal:        General: Swelling and tenderness present.     Right lower leg: No edema.     Left lower leg: Edema present.     Comments: [name] redness that is stable over  the weekend, less enflamed from admission. Severe tenderness to palpation in L knee persists. Still swollen, roughly stable, very limited ROM of L leg.  Skin:    General: Skin is warm and dry.     Findings: Rash present.  Neurological:     General: No focal deficit present.     Mental Status: She is alert.  Psychiatric:        Mood and Affect: Mood normal.        Behavior: Behavior normal.   Patient Lines/Drains/Airways Status     Active Line/Drains/Airways     Name Placement date Placement time Site Days   Peripheral IV 05/14/23 20 G 1.88 Left;Posterior Forearm 05/14/23  2333  Forearm  7   External Urinary Catheter 05/19/23  1014  --  2             ASSESSMENT/PLAN:  Assessment: Principal Problem:   Cellulitis of left lower extremity from knee to ankle Active Problems:   Hyperglycemia due to diabetes mellitus (HCC)   Type 2 diabetes mellitus (HCC)   AF (paroxysmal atrial fibrillation) (HCC)   Chronic anticoagulation - on coumadin  for afib  OSA on CPAP   GAD (generalized anxiety disorder)   Sepsis without acute organ dysfunction (HCC)   Chronic constipation   Pseudogout   Hyperglycemia   Dog scratch   Hx of penicillin allergy   Lower extremity edema  Kaylon Snow is a 73 y.o. female with pertinent PMH of T2 diabetes on insulin  who presented with left leg pain and is admitted for cellulitis.    Plan: Cellulitis of left lower extremity Bcx positive for Kocuria Rhizophila Sepsis Rule out due to LLE cellulitis (resolved) Pseudogout  With concerns of worsening erythema and joint pain, knee aspiration was performed which showed an elevated WBC of 2550, and elevated neutrophils. I don't believe these findings are suggestive of septic arthritis, and I would've expected pain to improve slightly at least with aspiration. Findings did also show calcium  pyrophosphate crystals, which is consistent with pseudogout which could explain her pain.  Unfortunately, pain remains a  significant issue. Colchicine  daily did not work for pain, will increase. Will also increase hydromorphone .  - ID and Ortho following, thank you  - Anticipate stopping Abx per ID, currently cefdinir , today would be day 9 of Abx - Colchicine  0.6 increase to BID - For further pain, Tylenol  1000 every 6 hours, Dilaudid  4 mg po every 2h as needed, Dilaudid  1-2mg  IV PRN when po not enough. Tizanidine  4mg  TID. Bowel regimen: Miralax  BID, Senna-docusate BID, fleet enema prn. - Trend white count and blood cultures, recheck CRP/SedR to trend   T2DM AGMA - possible mild DKA (resolved) Mild AG day 1 admission resolved with insulin . Most recent A1c is 7.4%. Takes Humulin R  11-20 units TID before meals, typically 300 units daily at home.   - Appreciate diabetes coordinators - Semglee  24 BID, SSI resistant   Constipation Endorses having a lazy bowel 2/2 extensive laxative use as a teenager.  Often needs fleet enemas in order to have BM. Has bedside commode. - Senna-docusate 1 tablet oral BID, Miralax  BID, Fleet enema prn     Dyspnea associated with Anxiety and OSA Intermittent dyspnea but no hypoxia. CPAP provided at that time and resolved the issue. Upon dyspnea, attempt reassurance about overall plan, will evaluate need for anxiolytics, oxygen available for comfort. - Continue home Buspar  20mg  TID. Atarax  50mg    Ambulatory dysfunction due to severe pain Some difficulty with walking, getting to toilet. RN assisting. PT consult.   Paroxysmal Afib Rate/Rhythm controlled without medicine. She takes warfarin outpatient. Will continue here per pharmacy.   Chronic HFpEF without exacerbation - Resume lasix  60 daily. Hold losartan  50mg  and spironolactone  12.5 mg every day in setting of low normal Bps. Check daily weights.   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  Will continue topiramate  200mg  po BID, Phenytoin  400mg  po qd. HLD - Continue home rosuvastatin  40mg   every day and zetia  10mg  every day  GERD - Continue home Protonix  40mg  qd Night Terrors - Home prazosin  1mg  am and 2mg  at bedtime Insomnia - Trazodone  200 at bedtime, home dose is 300mg  HTN - Holding home medicines per above  Best Practice: Diet: Regular diet IVF: none VTE: warfarin Code: Full AB: cefdinir  Therapy Recs: PT to see DISPO: Anticipated discharge in 2-3 days to Home pending  pain/infection control .  Signature: Lonni Africa, D.O.  Internal Medicine Resident, PGY-1 [facility]  Pager: [phone] 9:52 AM, 05/21/2023   Please contact the on call pager after 5 pm and on weekends at [phone].

## 2023-05-21 NOTE — Plan of Care (Signed)
  Problem: Skin Integrity: Goal: Risk for impaired skin integrity will decrease Outcome: Not Progressing   Problem: Safety: Goal: Ability to remain free from injury will improve Outcome: Not Progressing   Problem: Pain Management: Goal: General experience of comfort will improve Outcome: Not Progressing   Problem: Elimination: Goal: Will not experience complications related to bowel motility Outcome: Not Progressing   Problem: Coping: Goal: Level of anxiety will decrease Outcome: Not Progressing

## 2023-05-21 NOTE — Progress Notes (Signed)
 Orthopedic Progress Note  I saw the patient this morning.  Elicited further history from her.  She says that she has had severe low back pain and was given a wheelchair in the past for this problem.  She said she has had left knee pain for several months now.  She is been using a walker to offload that side.  She does not recall any recent UTI, pneumonia, or other infection besides the left leg which is being worked up for other potential cause of erythema besides cellulitis.  She has not noticed any drainage around the knee.  Her aspirate results still show CPPD crystals with 2.5K cells.  No organisms are growing on culture.  No organisms seen on Gram stain.  Her WBC is WNL.  She is afebrile.  She inquired about a steroid injection to her knee.  I told her that this was not a good idea.  I explained that if bacteria get introduced into her knee and cause a periprosthetic joint infection, then she would need at least 1 surgery but likely more.  She would have a difficult time healing with her history of diabetes and morbid obesity.  I would recommend continued symptomatic treatment at this time.  She has been on antibiotics so that could cause her aspirate to have a negative result.  Could repeat an aspirate in a couple weeks time to confirm no infection.  I do not have the ability to use Synovasure as a additional way to work her up for infection.  Will continue to follow.  She should see a joint surgeon after discharge given the complexity of her case. I explained the plan to her and answered all of her questions to her satisfaction.    Ozell DELENA Ada, MD Orthopedic Surgeon

## 2023-05-21 NOTE — Progress Notes (Signed)
 Subjective: No new complaints   Antibiotics:  Anti-infectives (From admission, onward)    Start     Dose/Rate Route Frequency Ordered Stop   05/21/23 1500  amoxicillin  (AMOXIL ) capsule 500 mg        500 mg Oral  Once 05/21/23 1357 05/21/23 1521   05/20/23 1430  cefdinir  (OMNICEF ) capsule 300 mg        300 mg Oral Every 12 hours 05/20/23 1330     05/19/23 1330  linezolid  (ZYVOX ) tablet 600 mg        600 mg Oral Every 12 hours 05/19/23 1243 05/20/23 2046   05/18/23 2200  vancomycin  (VANCOREADY) IVPB 750 mg/150 mL  Status:  Discontinued        750 mg 150 mL/hr over 60 Minutes Intravenous Every 12 hours 05/18/23 1714 05/18/23 1736   05/18/23 2200  vancomycin  (VANCOCIN ) 750 mg in sodium chloride  0.9 % 250 mL IVPB  Status:  Discontinued       Note to Pharmacy: Indication: Bacteremia   750 mg 265 mL/hr over 60 Minutes Intravenous Every 12 hours 05/18/23 1736 05/19/23 1243   05/18/23 1645  cefTRIAXone  (ROCEPHIN ) 2 g in sodium chloride  0.9 % 100 mL IVPB  Status:  Discontinued        2 g 200 mL/hr over 30 Minutes Intravenous Every 24 hours 05/18/23 1552 05/20/23 1330   05/17/23 2200  ceFEPIme  (MAXIPIME ) 2 g in sodium chloride  0.9 % 100 mL IVPB  Status:  Discontinued        2 g 200 mL/hr over 30 Minutes Intravenous Every 8 hours 05/17/23 2150 05/18/23 1551   05/14/23 2200  vancomycin  (VANCOCIN ) 750 mg in sodium chloride  0.9 % 250 mL IVPB  Status:  Discontinued        750 mg 250 mL/hr over 60 Minutes Intravenous Every 12 hours 05/14/23 1226 05/18/23 1551   05/13/23 2200  ceFEPIme  (MAXIPIME ) 2 g in sodium chloride  0.9 % 100 mL IVPB  Status:  Discontinued        2 g 200 mL/hr over 30 Minutes Intravenous Every 12 hours 05/13/23 1445 05/17/23 2150   05/13/23 2200  vancomycin  (VANCOREADY) IVPB 750 mg/150 mL  Status:  Discontinued        750 mg 150 mL/hr over 60 Minutes Intravenous Every 12 hours 05/13/23 1447 05/14/23 1226   05/13/23 1045  ceFEPIme  (MAXIPIME ) 2 g in sodium chloride   0.9 % 100 mL IVPB        2 g 200 mL/hr over 30 Minutes Intravenous  Once 05/13/23 1031 05/13/23 1121   05/13/23 1045  metroNIDAZOLE  (FLAGYL ) IVPB 500 mg        500 mg 100 mL/hr over 60 Minutes Intravenous  Once 05/13/23 1031 05/13/23 1347   05/13/23 1045  vancomycin  (VANCOCIN ) IVPB 1000 mg/200 mL premix        1,000 mg 200 mL/hr over 60 Minutes Intravenous  Once 05/13/23 1031 05/13/23 1518       Medications: Scheduled Meds:  acetaminophen   1,000 mg Oral Q6H   busPIRone   20 mg Oral TID   cefdinir   300 mg Oral Q12H   colchicine   0.6 mg Oral BID   diclofenac  Sodium  4 g Topical QID   ezetimibe   10 mg Oral Daily   [START ON 05/22/2023] FLUoxetine   60 mg Oral Daily   furosemide   60 mg Oral Daily   insulin  aspart  0-20 Units Subcutaneous TID WC   insulin  aspart  0-5 Units Subcutaneous QHS   insulin  glargine-yfgn  24 Units Subcutaneous BID   nystatin    Topical BID   pantoprazole   40 mg Oral BID   phenytoin   400 mg Oral Daily   polyethylene glycol  17 g Oral BID   prazosin   1 mg Oral q AM   And   prazosin   2 mg Oral QHS   rosuvastatin   40 mg Oral Daily   senna-docusate  2 tablet Oral Daily   tiZANidine   4 mg Oral TID   topiramate   200 mg Oral BID   Warfarin - Pharmacist Dosing Inpatient   Does not apply q1600   Continuous Infusions:   PRN Meds:.diphenhydrAMINE , EPINEPHrine , HYDROmorphone  (DILAUDID ) injection, HYDROmorphone , hydrOXYzine , ondansetron  (ZOFRAN ) IV, sodium chloride , trazodone     Objective: Weight change:   Intake/Output Summary (Last 24 hours) at 05/21/2023 1611 Last data filed at 05/21/2023 1413 Gross per 24 hour  Intake 360 ml  Output 1200 ml  Net -840 ml   Blood pressure (!) 110/48, pulse 73, temperature 98.4 F (36.9 C), temperature source Oral, resp. rate 14, height 5' 4 (1.626 m), weight (!) 144.3 kg, SpO2 93%. Temp:  [98 F (36.7 C)-98.8 F (37.1 C)] 98.4 F (36.9 C) (01/06 1606) Pulse Rate:  [73-87] 73 (01/06 0342) Resp:  [14-19] 14 (01/06  1606) BP: (110-141)/(48-60) 110/48 (01/06 1606) SpO2:  [92 %-96 %] 93 % (01/06 1606)  Physical Exam: Physical Exam Constitutional:      General: She is not in acute distress.    Appearance: She is well-developed. She is not diaphoretic.  HENT:     Head: Normocephalic and atraumatic.     Right Ear: External ear normal.     Left Ear: External ear normal.     Mouth/Throat:     Pharynx: No oropharyngeal exudate.  Eyes:     General: No scleral icterus.    Conjunctiva/sclera: Conjunctivae normal.     Pupils: Pupils are equal, round, and reactive to light.  Cardiovascular:     Rate and Rhythm: Normal rate and regular rhythm.     Heart sounds:     No gallop.  Pulmonary:     Effort: Pulmonary effort is normal. No respiratory distress.     Breath sounds: Normal breath sounds. No wheezing.  Abdominal:     General: Bowel sounds are normal. There is no distension.     Palpations: Abdomen is soft.     Tenderness: There is no abdominal tenderness. There is no rebound.  Musculoskeletal:        General: No tenderness.  Lymphadenopathy:     Cervical: No cervical adenopathy.  Skin:    General: Skin is warm and dry.     Coloration: Skin is not pale.     Findings: No erythema or rash.  Neurological:     General: No focal deficit present.     Mental Status: She is alert and oriented to person, place, and time.     Motor: No abnormal muscle tone.     Coordination: Coordination normal.  Psychiatric:        Mood and Affect: Mood normal.        Behavior: Behavior normal.        Thought Content: Thought content normal.        Judgment: Judgment normal.     Leg 1/2:    1/4    1/5     05/20/2022:    CBC:    BMET Recent Labs  05/19/23 0428 05/20/23 0810  NA 133* 135  K 3.4* 3.4*  CL 97* 97*  CO2 26 25  GLUCOSE 217* 196*  BUN 9 8  CREATININE 0.78 0.75  CALCIUM  8.3* 8.6*     Liver Panel  No results for input(s): PROT, ALBUMIN, AST, ALT, ALKPHOS,  BILITOT, BILIDIR, IBILI in the last 72 hours.     Sedimentation Rate Recent Labs    05/21/23 0958  ESRSEDRATE 121*   C-Reactive Protein Recent Labs    05/19/23 0929 05/21/23 0958  CRP 32.2* 21.0*    Micro Results: Recent Results (from the past 720 hours)  Blood Culture (routine x 2)     Status: None   Collection Time: 05/13/23 10:10 AM   Specimen: BLOOD RIGHT HAND  Result Value Ref Range Status   Specimen Description BLOOD RIGHT HAND  Final   Special Requests   Final    BOTTLES DRAWN AEROBIC AND ANAEROBIC Blood Culture results may not be optimal due to an inadequate volume of blood received in culture bottles   Culture  Setup Time   Final    GRAM POSITIVE COCCI IN CLUSTERS AEROBIC BOTTLE ONLY CRITICAL RESULT CALLED TO, READ BACK BY AND VERIFIED WITH: PHARMD J. LEDFORD 05/15/2023 @ 2218 BY AB    Culture   Final    KOCURIA RHIZOPHILA Standardized susceptibility testing for this organism is not available. Performed at [facility], [address] [place], [place] [address]    Report Status 05/17/2023 FINAL  Final  Blood Culture (routine x 2)     Status: None   Collection Time: 05/13/23 10:10 AM   Specimen: BLOOD RIGHT HAND  Result Value Ref Range Status   Specimen Description BLOOD RIGHT HAND  Final   Special Requests   Final    BOTTLES DRAWN AEROBIC AND ANAEROBIC Blood Culture adequate volume   Culture   Final    NO GROWTH 5 DAYS Performed at [facility], [address] [place], [place] [address]    Report Status 05/18/2023 FINAL  Final  Blood Culture ID Panel (Reflexed)     Status: None   Collection Time: 05/13/23 10:10 AM  Result Value Ref Range Status   Enterococcus faecalis NOT DETECTED NOT DETECTED Final   Enterococcus Faecium NOT DETECTED NOT DETECTED Final   Listeria monocytogenes NOT DETECTED NOT DETECTED Final   Staphylococcus species NOT DETECTED NOT DETECTED Final   Staphylococcus aureus (BCID) NOT DETECTED NOT DETECTED Final    Staphylococcus epidermidis NOT DETECTED NOT DETECTED Final   Staphylococcus lugdunensis NOT DETECTED NOT DETECTED Final   Streptococcus species NOT DETECTED NOT DETECTED Final   Streptococcus agalactiae NOT DETECTED NOT DETECTED Final   Streptococcus pneumoniae NOT DETECTED NOT DETECTED Final   Streptococcus pyogenes NOT DETECTED NOT DETECTED Final   A.calcoaceticus-baumannii NOT DETECTED NOT DETECTED Final   Bacteroides fragilis NOT DETECTED NOT DETECTED Final   Enterobacterales NOT DETECTED NOT DETECTED Final   Enterobacter cloacae complex NOT DETECTED NOT DETECTED Final   Escherichia coli NOT DETECTED NOT DETECTED Final   Klebsiella aerogenes NOT DETECTED NOT DETECTED Final   Klebsiella oxytoca NOT DETECTED NOT DETECTED Final   Klebsiella pneumoniae NOT DETECTED NOT DETECTED Final   Proteus species NOT DETECTED NOT DETECTED Final   Salmonella species NOT DETECTED NOT DETECTED Final   Serratia marcescens NOT DETECTED NOT DETECTED Final   Haemophilus influenzae NOT DETECTED NOT DETECTED Final   Neisseria meningitidis NOT DETECTED NOT DETECTED Final   Pseudomonas aeruginosa  NOT DETECTED NOT DETECTED Final   Stenotrophomonas maltophilia NOT DETECTED NOT DETECTED Final   Candida albicans NOT DETECTED NOT DETECTED Final   Candida auris NOT DETECTED NOT DETECTED Final   Candida glabrata NOT DETECTED NOT DETECTED Final   Candida krusei NOT DETECTED NOT DETECTED Final   Candida parapsilosis NOT DETECTED NOT DETECTED Final   Candida tropicalis NOT DETECTED NOT DETECTED Final   Cryptococcus neoformans/gattii NOT DETECTED NOT DETECTED Final    Comment: Performed at [facility], [address] [place], [place] [address]  Body fluid culture w Gram Stain     Status: None   Collection Time: 05/18/23 12:53 PM   Specimen: Synovium; Body Fluid  Result Value Ref Range Status   Specimen Description SYNOVIAL  Final   Special Requests KNEE  Final   Gram Stain NO WBC SEEN NO ORGANISMS SEEN    Final   Culture   Final    NO GROWTH 3 DAYS Performed at [facility], [address] [place], [place] [address]    Report Status 05/21/2023 FINAL  Final  Culture, blood (Routine X 2) w Reflex to ID Panel     Status: None (Preliminary result)   Collection Time: 05/18/23  6:20 PM   Specimen: BLOOD  Result Value Ref Range Status   Specimen Description BLOOD BLOOD RIGHT ARM  Final   Special Requests   Final    BOTTLES DRAWN AEROBIC AND ANAEROBIC Blood Culture results may not be optimal due to an inadequate volume of blood received in culture bottles   Culture   Final    NO GROWTH 3 DAYS Performed at [facility], [address], [place], [place] [address]    Report Status PENDING  Incomplete  Culture, blood (Routine X 2) w Reflex to ID Panel     Status: None (Preliminary result)   Collection Time: 05/18/23  6:20 PM   Specimen: BLOOD  Result Value Ref Range Status   Specimen Description BLOOD BLOOD LEFT HAND  Final   Special Requests   Final    BOTTLES DRAWN AEROBIC AND ANAEROBIC Blood Culture results may not be optimal due to an inadequate volume of blood received in culture bottles   Culture   Final    NO GROWTH 3 DAYS Performed at [facility], [address], [place], [place] [address]    Report Status PENDING  Incomplete  Culture, fungus without smear     Status: None (Preliminary result)   Collection Time: 05/19/23 11:09 AM   Specimen: Path Tissue; Other  Result Value Ref Range Status   Specimen Description TISSUE  Final   Special Requests skin biopsy  Final   Culture   Final    NO FUNGUS ISOLATED AFTER 2 DAYS Performed at [facility], [address], [place], [place] [address]    Report Status PENDING  Incomplete    Studies/Results: No results found.     Assessment/Plan:  INTERVAL HISTORY: Amoxicillin  challenge given today  Principal Problem:   Cellulitis of left lower extremity from knee to ankle Active Problems:   Hyperglycemia  due to diabetes mellitus (HCC)   Type 2 diabetes mellitus (HCC)   AF (paroxysmal atrial fibrillation) (HCC)   Chronic anticoagulation - on coumadin  for afib   OSA on CPAP   GAD (generalized anxiety disorder)   Sepsis without acute organ dysfunction (HCC)   Chronic constipation   Pseudogout   Hyperglycemia   Dog scratch  Hx of penicillin allergy   Lower extremity edema    Donna Snow is a 73 y.o. female with history of atrial fibrillation on Coumadin  asthma hyperlipidemia prior stroke seizures depression who sustained a scratch to her lower extremities of a dog and developed erythema proximal and scratch that worsened in the 2 to 3 days prior to mission to [facility] she also has had pain in her knees bilateral particular the left side there was concern for septic arthritis.  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  Cellulitis: I am skeptical of this diagnosis  Punch biopsies were obtained yesterday.  Unfortunately pathology not locate the order which I entered on Saturday initially but were eventually able to find it and are going to process the specimen.  The other him and I have sent for AFB and fungal cultures.   #2 Kocuria on 1/2 blood cultures  is a contaminant  #3 PCN allergy: she was given Amoxcillin and is I believe in midst of one hour post dose observation  #4 Painful knees L>R:  Seems to be pseudogout but I agree with orthopedic surgery that it would be wise to reaspirate the joint in a few weeks to see what it looks like at that point in time my suspicion is not high for infection at  this point       LOS: 7 days   Donna Snow 05/21/2023, 4:11 PM

## 2023-05-22 DIAGNOSIS — M1120 Other chondrocalcinosis, unspecified site: Secondary | ICD-10-CM | POA: Diagnosis not present

## 2023-05-22 DIAGNOSIS — Z7901 Long term (current) use of anticoagulants: Secondary | ICD-10-CM | POA: Diagnosis not present

## 2023-05-22 LAB — CBC
HCT: 37.7 % (ref 36.0–46.0)
Hemoglobin: 12.2 g/dL (ref 12.0–15.0)
MCH: 27.9 pg (ref 26.0–34.0)
MCHC: 32.4 g/dL (ref 30.0–36.0)
MCV: 86.3 fL (ref 80.0–100.0)
Platelets: 483 10*3/uL — ABNORMAL HIGH (ref 150–400)
RBC: 4.37 MIL/uL (ref 3.87–5.11)
RDW: 13.5 % (ref 11.5–15.5)
WBC: 8.5 10*3/uL (ref 4.0–10.5)
nRBC: 0 % (ref 0.0–0.2)

## 2023-05-22 LAB — RENAL FUNCTION PANEL
Albumin: 2 g/dL — ABNORMAL LOW (ref 3.5–5.0)
Anion gap: 12 (ref 5–15)
BUN: 9 mg/dL (ref 8–23)
CO2: 29 mmol/L (ref 22–32)
Calcium: 8.8 mg/dL — ABNORMAL LOW (ref 8.9–10.3)
Chloride: 95 mmol/L — ABNORMAL LOW (ref 98–111)
Creatinine, Ser: 0.83 mg/dL (ref 0.44–1.00)
GFR, Estimated: >60 mL/min (ref >60)
Glucose, Bld: 169 mg/dL — ABNORMAL HIGH (ref 70–99)
Phosphorus: 3.6 mg/dL (ref 2.5–4.6)
Potassium: 3.9 mmol/L (ref 3.5–5.1)
Sodium: 136 mmol/L (ref 135–145)

## 2023-05-22 LAB — PROTIME-INR
INR: 2.6 — ABNORMAL HIGH (ref 0.8–1.2)
Prothrombin Time: 28.2 s — ABNORMAL HIGH (ref 11.4–15.2)

## 2023-05-22 LAB — GLUCOSE, CAPILLARY
Glucose-Capillary: 161 mg/dL — ABNORMAL HIGH (ref 70–99)
Glucose-Capillary: 238 mg/dL — ABNORMAL HIGH (ref 70–99)
Glucose-Capillary: 185 mg/dL — ABNORMAL HIGH (ref 70–99)
Glucose-Capillary: 302 mg/dL — ABNORMAL HIGH (ref 70–99)

## 2023-05-22 MED ORDER — PREDNISONE 20 MG PO TABS
50.0000 mg | ORAL_TABLET | Freq: Every day | ORAL | Status: AC
  Administered 2023-05-22 – 2023-05-26 (×5): 50 mg via ORAL
  Filled 2023-05-22 (×5): qty 1

## 2023-05-22 MED ORDER — INSULIN GLARGINE-YFGN 100 UNIT/ML ~~LOC~~ SOLN
28.0000 [IU] | Freq: Two times a day (BID) | SUBCUTANEOUS | Status: DC
  Administered 2023-05-22 – 2023-05-29 (×14): 28 [IU] via SUBCUTANEOUS
  Filled 2023-05-22 (×15): qty 0.28

## 2023-05-22 MED ORDER — WARFARIN SODIUM 2 MG PO TABS
2.0000 mg | ORAL_TABLET | Freq: Once | ORAL | Status: AC
  Administered 2023-05-22: 2 mg via ORAL
  Filled 2023-05-22: qty 1

## 2023-05-22 NOTE — TOC Initial Note (Signed)
 Transition of Care [facility]) - Initial/Assessment Note    Patient Details  Name: Donna Snow MRN: [id number] Date of Birth: February 28, 1951  Transition [facility]) CM/SW Contact:    Bridget Cordella Simmonds, LCSW Phone Number: 05/22/2023, 4:11 PM  Clinical Narrative:       CSW met with pt regarding PT recommendation for SNF.  Pt agreeable to this, medicare choice document provided, permission given to send out referral in hub.  Pt from home with daughter and son in law.  No current services.  Permission given to speak with daughter [name].  Referral sent out in hub for SNF.  Additional info required for passr.              Expected Discharge Plan: Skilled Nursing Facility Barriers to Discharge: Continued Medical Work up, SNF Pending bed offer   Patient Goals and CMS Choice Patient states their goals for this hospitalization and ongoing recovery are:: walk CMS Medicare.gov Compare Post Acute Care list provided to:: Patient Choice offered to / list presented to : Patient      Expected Discharge Plan and Services In-house Referral: Clinical Social Work   Post Acute Care Choice: Skilled Nursing Facility Living arrangements for the past 2 months: Single Family Home                                      Prior Living Arrangements/Services Living arrangements for the past 2 months: Single Family Home Lives with:: Adult Children (daughter, son in social worker) Patient language and need for interpreter reviewed:: Yes Do you feel safe going back to the place where you live?: Yes      Need for Family Participation in Patient Care: Yes (Comment) Care giver support system in place?: Yes (comment) Current home services: Other (comment) (none) Criminal Activity/Legal Involvement Pertinent to Current Situation/Hospitalization: No - Comment as needed  Activities of Daily Living   ADL Screening (condition at time of admission) Independently performs ADLs?: No Does the patient have a NEW difficulty  with bathing/dressing/toileting/self-feeding that is expected to last >3 days?: No Does the patient have a NEW difficulty with getting in/out of bed, walking, or climbing stairs that is expected to last >3 days?: Yes (Initiates electronic notice to provider for possible PT consult) Does the patient have a NEW difficulty with communication that is expected to last >3 days?: No Is the patient deaf or have difficulty hearing?: No Does the patient have difficulty seeing, even when wearing glasses/contacts?: No Does the patient have difficulty concentrating, remembering, or making decisions?: No  Permission Sought/Granted Permission sought to share information with : Family Supports Permission granted to share information with : Yes, Verbal Permission Granted  Share Information with NAME: daughter [name]  Permission granted to share info w AGENCY: SNF        Emotional Assessment Appearance:: Appears stated age Attitude/Demeanor/Rapport: Engaged Affect (typically observed): Appropriate, Pleasant Orientation: : Oriented to Self, Oriented to Place, Oriented to  Time, Oriented to Situation      Admission diagnosis:  Hyperglycemia [R73.9] Cellulitis of left foot [L03.116] Cellulitis of left lower extremity [L03.116] Patient Active Problem List   Diagnosis Date Noted   Pseudogout 05/20/2023   Hyperglycemia 05/20/2023   Dog scratch 05/20/2023   Hx of penicillin allergy 05/20/2023   Lower extremity edema 05/20/2023   Sepsis without acute organ dysfunction (HCC) 05/15/2023   Chronic constipation 05/15/2023   Cellulitis of left lower  extremity from knee to ankle 05/13/2023   Seizure (HCC) 05/10/2022   CHF (congestive heart failure), NYHA class III, acute on chronic, combined (HCC) 05/10/2022   Chronic heart failure with preserved ejection fraction (HFpEF) (HCC) 06/07/2021   Chronic anticoagulation - on coumadin  for afib 06/03/2021   Morbid obesity with BMI of 50.0-59.9, adult (HCC)  06/03/2021   AF (paroxysmal atrial fibrillation) (HCC) 10/17/2020   Type 2 diabetes mellitus (HCC) 10/16/2020   Hyperglycemia due to diabetes mellitus (HCC) 10/15/2020   Hypoalbuminemia due to protein-calorie malnutrition (HCC) 10/15/2020   Essential hypertension 10/15/2020   Hyperlipidemia 10/15/2020   GERD (gastroesophageal reflux disease) 10/15/2020   Chronic back pain 10/15/2020   Cervical spondylosis 07/18/2019   Spondylosis without myelopathy or radiculopathy, lumbar region 12/12/2018   GAD (generalized anxiety disorder) 05/27/2018   OSA on CPAP 12/11/2017   Anxiety state 12/26/2013   Nonintractable epilepsy without status epilepticus (HCC) 12/26/2013   Irritable bowel syndrome without diarrhea 12/26/2013   Moderate single current episode of major depressive disorder (HCC) 12/26/2013   Unspecified osteoarthritis, unspecified site 12/26/2013   PCP:  [facility] Pharmacy:   [facility] [address] [place]), [place] - [address] [place]) [place] [address] Phone: [phone] Fax: [phone]  [facility] - [place], [place] - [address] [place] [address] Phone: [phone] Fax: [phone]  [facility] [address] [place] [address] Phone: [phone] Fax: [phone]  [facility] - [place], [place] - [address] [place] [address] Phone: [phone] Fax: [phone]     Social Drivers of Health (SDOH) Social History: SDOH Screenings   Food Insecurity: No Food Insecurity (05/13/2023)  Housing: Unknown (05/13/2023)  Transportation Needs: No Transportation Needs (05/13/2023)  Utilities: Not At Risk (05/13/2023)  Depression (PHQ2-9): Medium Risk (05/03/2023)  Financial Resource Strain: High Risk (08/15/2022)  Physical Activity: Insufficiently Active (05/17/2022)   [facility]  [facility], [facility], [facility]  Social Connections: Socially Isolated (05/15/2023)  Stress: Stress Concern Present (05/17/2022)   [facility] from [facility], [facility], [facility], [facility]  Tobacco Use: Low Risk  (05/17/2023)   SDOH Interventions:     Readmission Risk Interventions     No data to display

## 2023-05-22 NOTE — Progress Notes (Signed)
 PHARMACY - ANTICOAGULATION CONSULT NOTE  Pharmacy Consult for warfarin Indication: atrial fibrillation  Allergies  Allergen Reactions   Latex Other (See Comments)    Welts - cant wear them, but can be touched by someone who wears them.   Vital Signs: Temp: 98.7 F (37.1 C) (01/07 0612) Temp Source: Oral (01/07 0612) BP: 141/74 (01/07 0612) Pulse Rate: 88 (01/07 0612)  Labs: Recent Labs    05/20/23 0524 05/20/23 0810 05/21/23 0500 05/22/23 0630  HGB 12.2 12.7 11.9* 12.2  HCT 36.9 38.2 36.6 37.7  PLT 415* 439* 432* 483*  LABPROT 29.7*  --  26.0* 28.2*  INR 2.8*  --  2.4* 2.6*  CREATININE  --  0.75  --   --     Estimated Creatinine Clearance: 92.1 mL/min (by C-G formula based on SCr of 0.75 mg/dL).   Medical History: Past Medical History:  Diagnosis Date   Abdominal wall abscess 10/15/2020   Abdominal wall cellulitis 10/15/2020   Anxiety    Cellulitis 06/04/2021   Cellulitis of abdominal wall 02/14/2022   CHF (congestive heart failure) (HCC)    Diabetes mellitus    Hypercholesteremia    Hypertension    Obesity    Seizures (HCC)    Stroke [facility])     Assessment: Donna Snow is a 72yoF who presented from home due to cellulitis and possible UTI. Patient is on chronic warfarin for Afib and also Dilantin  for history of seizures. Pharmacy consulted to manage warfarin while inpatient. Patient took warfarin dose 12/29. Per patient, 5mg  still caused INR to be high so dose was reduced to ~3mg . Warfarin held 1/2 >>1/4 for supratherapeutic INR.  05/21/23: INR therapeutic at 2.6. Home penytoin resumed. CBC stable. 50% meals documented.   Goal of Therapy:  INR 2-3 Monitor platelets by anticoagulation protocol: Yes   Plan:  Warfarin 2 mg this evening Monitor daily INR, CBC, clinical course, s/sx of bleed, PO intake/diet, and for any new DDIs  Josefa Range, PharmD PGY1 Pharmacy Resident 05/22/2023 7:24 AM

## 2023-05-22 NOTE — NC FL2 (Signed)
 [place]  MEDICAID FL2 LEVEL OF CARE FORM     IDENTIFICATION  Patient Name: Donna Snow Birthdate: 1951-03-16 Sex: female Admission Date (Current Location): 05/13/2023  [facility] and [place] Number:  Producer, Television/film/video and Address:  [facility] [place], [address] [place], [place] [address]      Provider Number: [id number]  Attending Physician Name and Address:  Karna Fellows, MD  Relative Name and Phone Number:  [name] Daughter   [phone]    Current Level of Care: Hospital Recommended Level of Care: Skilled Nursing Facility Prior Approval Number:    Date Approved/Denied:   PASRR Number:    Discharge Plan: SNF    Current Diagnoses: Patient Active Problem List   Diagnosis Date Noted   Pseudogout 05/20/2023   Hyperglycemia 05/20/2023   Dog scratch 05/20/2023   Hx of penicillin allergy 05/20/2023   Lower extremity edema 05/20/2023   Sepsis without acute organ dysfunction (HCC) 05/15/2023   Chronic constipation 05/15/2023   Cellulitis of left lower extremity from knee to ankle 05/13/2023   Seizure (HCC) 05/10/2022   CHF (congestive heart failure), NYHA class III, acute on chronic, combined (HCC) 05/10/2022   Chronic heart failure with preserved ejection fraction (HFpEF) (HCC) 06/07/2021   Chronic anticoagulation - on coumadin  for afib 06/03/2021   Morbid obesity with BMI of 50.0-59.9, adult (HCC) 06/03/2021   AF (paroxysmal atrial fibrillation) (HCC) 10/17/2020   Type 2 diabetes mellitus (HCC) 10/16/2020   Hyperglycemia due to diabetes mellitus (HCC) 10/15/2020   Hypoalbuminemia due to protein-calorie malnutrition (HCC) 10/15/2020   Essential hypertension 10/15/2020   Hyperlipidemia 10/15/2020   GERD (gastroesophageal reflux disease) 10/15/2020   Chronic back pain 10/15/2020   Cervical spondylosis 07/18/2019   Spondylosis without myelopathy or radiculopathy, lumbar region 12/12/2018   GAD (generalized anxiety disorder) 05/27/2018    OSA on CPAP 12/11/2017   Anxiety state 12/26/2013   Nonintractable epilepsy without status epilepticus (HCC) 12/26/2013   Irritable bowel syndrome without diarrhea 12/26/2013   Moderate single current episode of major depressive disorder (HCC) 12/26/2013   Unspecified osteoarthritis, unspecified site 12/26/2013    Orientation RESPIRATION BLADDER Height & Weight     Self, Time, Situation, Place  Normal Continent, External catheter Weight: (!) 318 lb 2 oz (144.3 kg) Height:  5' 4 (162.6 cm) (per PT)  BEHAVIORAL SYMPTOMS/MOOD NEUROLOGICAL BOWEL NUTRITION STATUS    Convulsions/Seizures Incontinent Diet (see discharge summary)  AMBULATORY STATUS COMMUNICATION OF NEEDS Skin   Limited Assist Verbally Other (Comment) (redness)                       Personal Care Assistance Level of Assistance  Bathing, Feeding, Dressing Bathing Assistance: Maximum assistance Feeding assistance: Limited assistance Dressing Assistance: Maximum assistance     Functional Limitations Info  Sight, Hearing, Speech Sight Info: Adequate Hearing Info: Adequate Speech Info: Adequate    SPECIAL CARE FACTORS FREQUENCY  PT (By licensed PT), OT (By licensed OT)     PT Frequency: 5x week OT Frequency: 5x week            Contractures Contractures Info: Not present    Additional Factors Info  Code Status, Allergies, Insulin  Sliding Scale Code Status Info: full Allergies Info: latex   Insulin  Sliding Scale Info: Novolog : see discharge summary       Current Medications (05/22/2023):  This is the current hospital active medication list Current Facility-Administered Medications  Medication Dose Route Frequency Provider Last Rate Last Admin  acetaminophen  (TYLENOL ) tablet 1,000 mg  1,000 mg Oral Q6H Juberg, Christopher, DO   1,000 mg at 05/22/23 1215   busPIRone  (BUSPAR ) tablet 20 mg  20 mg Oral TID Zheng, Michael, DO   20 mg at 05/22/23 1431   colchicine  tablet 0.6 mg  0.6 mg Oral BID Karna Fellows, MD    0.6 mg at 05/22/23 9050   diclofenac  Sodium (VOLTAREN ) 1 % topical gel 4 g  4 g Topical QID Juberg, Christopher, DO   4 g at 05/22/23 1433   diphenhydrAMINE  (BENADRYL ) injection 25 mg  25 mg Intravenous Once PRN Fleeta Rothman, Jomarie SAILOR, MD       EPINEPHrine  (EPI-PEN) injection 0.3 mg  0.3 mg Intramuscular Once PRN Fleeta Rothman, Jomarie SAILOR, MD       ezetimibe  (ZETIA ) tablet 10 mg  10 mg Oral Daily Zheng, Michael, DO   10 mg at 05/22/23 9051   FLUoxetine  (PROZAC ) capsule 60 mg  60 mg Oral Daily Karna Fellows, MD   60 mg at 05/22/23 0948   furosemide  (LASIX ) tablet 60 mg  60 mg Oral Daily Juberg, Christopher, DO   60 mg at 05/22/23 9053   HYDROmorphone  (DILAUDID ) injection 1-2 mg  1-2 mg Intravenous Q4H PRN Karna Fellows, MD   2 mg at 05/22/23 0836   HYDROmorphone  (DILAUDID ) tablet 4 mg  4 mg Oral Q2H PRN Karna Fellows, MD   4 mg at 05/22/23 9378   hydrOXYzine  (ATARAX ) tablet 50 mg  50 mg Oral BID PRN Harrie Bruckner, DO   50 mg at 05/20/23 2049   insulin  aspart (novoLOG ) injection 0-20 Units  0-20 Units Subcutaneous TID WC Harrie Bruckner, DO   7 Units at 05/22/23 1216   insulin  aspart (novoLOG ) injection 0-5 Units  0-5 Units Subcutaneous QHS Zheng, Michael, DO   2 Units at 05/15/23 2224   insulin  glargine-yfgn (SEMGLEE ) injection 28 Units  28 Units Subcutaneous BID Juberg, Christopher, DO       nystatin  (MYCOSTATIN /NYSTOP ) topical powder   Topical BID Zheng, Michael, DO   Given at 05/22/23 0950   ondansetron  (ZOFRAN ) injection 4 mg  4 mg Intravenous Q8H PRN Zheng, Michael, DO   4 mg at 05/16/23 1222   pantoprazole  (PROTONIX ) EC tablet 40 mg  40 mg Oral BID Juberg, Christopher, DO   40 mg at 05/22/23 9052   phenytoin  (DILANTIN ) ER capsule 400 mg  400 mg Oral Daily Zheng, Michael, DO   400 mg at 05/22/23 9050   polyethylene glycol (MIRALAX  / GLYCOLAX ) packet 17 g  17 g Oral BID Juberg, Christopher, DO   17 g at 05/22/23 9050   prazosin  (MINIPRESS ) capsule 1 mg  1 mg Oral q AM Zheng, Michael, DO   1 mg at  05/22/23 9389   And   prazosin  (MINIPRESS ) capsule 2 mg  2 mg Oral QHS Zheng, Michael, DO   2 mg at 05/21/23 2058   predniSONE  (DELTASONE ) tablet 50 mg  50 mg Oral Q breakfast Harrie Bruckner, DO   50 mg at 05/22/23 1216   rosuvastatin  (CRESTOR ) tablet 40 mg  40 mg Oral Daily Zheng, Michael, DO   40 mg at 05/22/23 9052   senna-docusate (Senokot-S) tablet 2 tablet  2 tablet Oral Daily Elicia Sharper, DO   2 tablet at 05/22/23 9051   sodium chloride  (OCEAN) 0.65 % nasal spray 1 spray  1 spray Each Nare PRN Trudy Mliss Dragon, MD       tiZANidine  (ZANAFLEX ) tablet 4 mg  4 mg  Oral TID Karna Fellows, MD   4 mg at 05/22/23 9051   topiramate  (TOPAMAX ) tablet 200 mg  200 mg Oral BID Zheng, Michael, DO   200 mg at 05/22/23 1004   traZODone  (DESYREL ) tablet 200 mg  200 mg Oral QHS PRN Harrie Bruckner, DO   200 mg at 05/21/23 2056   warfarin (COUMADIN ) tablet 2 mg  2 mg Oral ONCE-1600 Klus, Josefa CROME, RPH       Warfarin - Pharmacist Dosing Inpatient   Does not apply q1600 Trudy Mliss Dragon, MD   Given at 05/16/23 1646     Discharge Medications: Please see discharge summary for a list of discharge medications.  Relevant Imaging Results:  Relevant Lab Results:   Additional Information SSN: [id number]  Bridget Cordella Simmonds, LCSW

## 2023-05-22 NOTE — Plan of Care (Signed)

## 2023-05-22 NOTE — Progress Notes (Addendum)
 HD#8 SUBJECTIVE:  Patient Summary: Donna Snow is a 73 y.o. female with pertinent PMH of 2 diabetes on insulin  who presented with left leg pain and is admitted for cellulitis.    Overnight Events: None   Interim History: Pain in her left knee continues to be severe. She finds more improvement with IV Dilaudid  than oral. She did attempt to work with physical therapy yesterday which was very difficult. Continues to express wishes to return home to see her dog.  OBJECTIVE:  Vital Signs: Vitals:   05/21/23 1606 05/21/23 1621 05/22/23 0612 05/22/23 0806  BP: (!) 110/48 (!) 116/59 (!) 141/74 (!) 115/34  Pulse:   88 84  Resp: 14 20 18 16   Temp: 98.4 F (36.9 C) 98.2 F (36.8 C) 98.7 F (37.1 C) 98.6 F (37 C)  TempSrc: Oral Oral Oral Oral  SpO2: 93% 95% 95% 92%  Weight:      Height:       Supplemental O2: Room Air SpO2: 92 % O2 Flow Rate (L/min): 2 L/min  Filed Weights   05/18/23 0500 05/19/23 0455 05/20/23 0500  Weight: (!) 142.8 kg (!) 144.3 kg (!) 144.3 kg     Intake/Output Summary (Last 24 hours) at 05/22/2023 1516 Last data filed at 05/22/2023 1400 Gross per 24 hour  Intake --  Output 501 ml  Net -501 ml   Net IO Since Admission: -3,004.86 mL [05/22/23 1309]  Physical Exam: Physical Exam Constitutional:      General: She is not in acute distress.    Appearance: She is obese. She is ill-appearing.  HENT:     Nose: Nose normal.  Cardiovascular:     Rate and Rhythm: Normal rate.     Pulses: Normal pulses.  Pulmonary:     Effort: Pulmonary effort is normal.     Breath sounds: No wheezing or rales.  Abdominal:     General: Abdomen is flat. There is no distension.     Tenderness: There is no abdominal tenderness.  Musculoskeletal:        General: Swelling and tenderness present.     Right lower leg: No edema.     Left lower leg: Edema present.     Comments: L lower leg cellulitis redness resolving, circumferential redness very minimal, there remains a red  unulcerated rash on the lateral L lower leg, but also less inflamed from admission. Severe tenderness to palpation in L knee persists with even very light touch. Still quite swollen, roughly stable, very limited ROM of L leg. No erythema.  Skin:    General: Skin is warm and dry.     Findings: Rash present.  Neurological:     General: No focal deficit present.     Mental Status: She is alert.  Psychiatric:        Mood and Affect: Mood normal.        Behavior: Behavior normal.   Patient Lines/Drains/Airways Status     Active Line/Drains/Airways     Name Placement date Placement time Site Days   Peripheral IV 05/14/23 20 G 1.88 Left;Posterior Forearm 05/14/23  2333  Forearm  8   External Urinary Catheter 05/19/23  1014  --  3             ASSESSMENT/PLAN:  Assessment: Principal Problem:   Cellulitis of left lower extremity from knee to ankle Active Problems:   Hyperglycemia due to diabetes mellitus (HCC)   Type 2 diabetes mellitus (HCC)   AF (paroxysmal atrial fibrillation) (  HCC)   Chronic anticoagulation - on coumadin  for afib   OSA on CPAP   GAD (generalized anxiety disorder)   Sepsis without acute organ dysfunction (HCC)   Chronic constipation   Pseudogout   Hyperglycemia   Dog scratch   Hx of penicillin allergy   Lower extremity edema  Donna Snow is a 73 y.o. female with pertinent PMH of T2 diabetes on insulin  who presented with left leg pain and is admitted for cellulitis, now left knee pain c/f pseudogout.    Plan: Pseudogout  Sepsis Rule out due to LLE cellulitis (resolved) Cellulitis of left lower extremity (resolved) Bcx positive for Kocuria Rhizophila, contaminant Pain in left knee concerning for pseudogout. Less suspicious for septic joint, reviewed by orthopedics and ID. Unfortunately, pain remains a significant issue. Increased pain meds yesterday. Will do steroids as well, had hoped to avoid with diabetes but running out of options. - ID and Ortho  following, thank you  - Done with Abx per ID, Vanc/Cefepim reduced to cefdinir , 9 days of Abx last day 1/6 - Colchicine  0.6 BID - Start prednisone  50qd. Monitor sugars. - For further pain, Tylenol  1000 every 6 hours, Dilaudid  4 mg po every 2h as needed, Dilaudid  1-2mg  IV PRN when po not enough. Tizanidine  4mg  TID. Bowel regimen: Miralax  BID, Senna-docusate BID, fleet enema prn. - Trend white count and blood cultures, rechecked CRP/SedR with reduction yesterday   T2DM AGMA - possible mild DKA (resolved) Mild AG day 1 admission resolved with insulin . Most recent A1c is 7.4%. Takes Humulin R  11-20 units TID before meals, typically 300 units daily at home.   - Semglee  24 -> 28 BID w/ prednisone  treatment, SSI resistant   Constipation Endorses having a lazy bowel 2/2 extensive laxative use as a teenager.  Often needs fleet enemas in order to have BM. Has bedside commode. - Senna-docusate 1 tablet oral BID, Miralax  BID, Fleet enema prn     Dyspnea associated with Anxiety and OSA Intermittent dyspnea but no hypoxia. CPAP provided at that time and resolved the issue. Upon dyspnea, attempt reassurance about overall plan, will evaluate need for anxiolytics, oxygen available for comfort. - Continue home Buspar  20mg  TID. Atarax  50mg    Ambulatory dysfunction due to severe pain Some difficulty with walking, getting to toilet. RN assisting. PT consult, recommending SNF.   Paroxysmal Afib Rate/Rhythm controlled without medicine. She takes warfarin outpatient. Will continue here per pharmacy.   Chronic HFpEF without exacerbation - Resume lasix  60 daily. Hold losartan  50mg  and spironolactone  12.5 mg every day in setting of low normal Bps. Check daily weights.   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  Will continue topiramate  200mg  po BID, Phenytoin  400mg  po qd. HLD - Continue home rosuvastatin  40mg  every day and zetia  10mg  every day  GERD - Continue home  Protonix  40mg  qd Night Terrors - Home prazosin  1mg  am and 2mg  at bedtime Insomnia - Trazodone  200 at bedtime, home dose is 300mg  HTN - Holding home medicines per above  Best Practice: Diet: Regular diet IVF: none VTE: warfarin Code: Full AB: 9 Days Vanc/Cefepime  -> cefdinir  -> end 1/6 Therapy Recs: PT rec SNF DISPO: Anticipated discharge in 2-3 days to SNF pending pain control  Signature: Lonni Africa, D.O.  Internal Medicine Resident, PGY-1 [facility]  Pager: [phone] 1:09 PM, 05/22/2023   Please contact the on call pager after 5 pm and on weekends at [phone].

## 2023-05-22 NOTE — Plan of Care (Signed)

## 2023-05-22 NOTE — Progress Notes (Signed)
 RE:  Donna Snow       Date of Birth:  12/11/1950     Date:  05/22/23        To Whom It May Concern:  Please be advised that the above-named patient will require a short-term nursing home stay - anticipated 30 days or less for rehabilitation and strengthening.  The plan is for return home.                 MD signature                Date

## 2023-05-23 DIAGNOSIS — E1165 Type 2 diabetes mellitus with hyperglycemia: Secondary | ICD-10-CM | POA: Diagnosis not present

## 2023-05-23 DIAGNOSIS — M1120 Other chondrocalcinosis, unspecified site: Secondary | ICD-10-CM | POA: Diagnosis not present

## 2023-05-23 LAB — CULTURE, BLOOD (ROUTINE X 2)
Culture: NO GROWTH
Culture: NO GROWTH

## 2023-05-23 LAB — GLUCOSE, CAPILLARY
Glucose-Capillary: 269 mg/dL — ABNORMAL HIGH (ref 70–99)
Glucose-Capillary: 208 mg/dL — ABNORMAL HIGH (ref 70–99)
Glucose-Capillary: 303 mg/dL — ABNORMAL HIGH (ref 70–99)
Glucose-Capillary: 239 mg/dL — ABNORMAL HIGH (ref 70–99)

## 2023-05-23 LAB — RENAL FUNCTION PANEL
Albumin: 2.1 g/dL — ABNORMAL LOW (ref 3.5–5.0)
Anion gap: 10 (ref 5–15)
BUN: 15 mg/dL (ref 8–23)
CO2: 30 mmol/L (ref 22–32)
Calcium: 8.8 mg/dL — ABNORMAL LOW (ref 8.9–10.3)
Chloride: 95 mmol/L — ABNORMAL LOW (ref 98–111)
Creatinine, Ser: 0.93 mg/dL (ref 0.44–1.00)
GFR, Estimated: >60 mL/min
Glucose, Bld: 192 mg/dL — ABNORMAL HIGH (ref 70–99)
Phosphorus: 3 mg/dL (ref 2.5–4.6)
Potassium: 3.6 mmol/L (ref 3.5–5.1)
Sodium: 135 mmol/L (ref 135–145)

## 2023-05-23 LAB — CBC
HCT: 38.1 % (ref 36.0–46.0)
Hemoglobin: 12.3 g/dL (ref 12.0–15.0)
MCH: 27.7 pg (ref 26.0–34.0)
MCHC: 32.3 g/dL (ref 30.0–36.0)
MCV: 85.8 fL (ref 80.0–100.0)
Platelets: 488 10*3/uL — ABNORMAL HIGH (ref 150–400)
RBC: 4.44 MIL/uL (ref 3.87–5.11)
RDW: 13.4 % (ref 11.5–15.5)
WBC: 8.2 10*3/uL (ref 4.0–10.5)
nRBC: 0 % (ref 0.0–0.2)

## 2023-05-23 LAB — PROTIME-INR
INR: 2.3 — ABNORMAL HIGH (ref 0.8–1.2)
Prothrombin Time: 25.4 s — ABNORMAL HIGH (ref 11.4–15.2)

## 2023-05-23 MED ORDER — TRAZODONE HCL 50 MG PO TABS
300.0000 mg | ORAL_TABLET | Freq: Every evening | ORAL | Status: DC | PRN
  Administered 2023-05-23 – 2023-05-28 (×4): 300 mg via ORAL
  Filled 2023-05-23 (×4): qty 6

## 2023-05-23 MED ORDER — ADULT MULTIVITAMIN W/MINERALS CH
1.0000 | ORAL_TABLET | Freq: Every day | ORAL | Status: DC
  Administered 2023-05-23 – 2023-05-29 (×7): 1 via ORAL
  Filled 2023-05-23 (×7): qty 1

## 2023-05-23 MED ORDER — GLUCERNA SHAKE PO LIQD
237.0000 mL | Freq: Three times a day (TID) | ORAL | Status: DC
  Administered 2023-05-23 – 2023-05-29 (×15): 237 mL via ORAL

## 2023-05-23 MED ORDER — ORAL CARE MOUTH RINSE: 15.0000 mL | OROMUCOSAL | Status: DC | PRN

## 2023-05-23 MED ORDER — WARFARIN SODIUM 3 MG PO TABS
3.0000 mg | ORAL_TABLET | Freq: Once | ORAL | Status: AC
  Administered 2023-05-23: 3 mg via ORAL
  Filled 2023-05-23: qty 1

## 2023-05-23 NOTE — TOC Progression Note (Addendum)
 Transition of Care [facility]) - Progression Note    Patient Details  Name: Donna Snow MRN: [id number] Date of Birth: 09-08-1950  [facility] CM/SW Contact  Bridget Cordella Simmonds, LCSW Phone Number: 05/23/2023, 12:30 PM  Clinical Narrative:   PASSR docs uploaded to [place] [facility].  CSW spoke with pt and daughter [name], bed offers provided.  They will accept offer at [facility], still asking about air mattress.  CSW confirmed with [facility] that they can receive pt and do have air mattress.    1440: Pt does not have active [employer].  [facility] informed CSW that [place] is showing inactive and that pt does have active [facility] Medicare, [id number].  CSW entered [employer] into [place] and does receive message that current date is not eligible for auth.  Auth request submitted in [place] with [facility] medicare information, auth approved: [id number], 5 days: 1/9-1/13.    Expected Discharge Plan: Skilled Nursing Facility Barriers to Discharge: Continued Medical Work up, SNF Pending bed offer  Expected Discharge Plan and Services In-house Referral: Clinical Social Work   Post Acute Care Choice: Skilled Nursing Facility Living arrangements for the past 2 months: Single Family Home                                       Social Determinants of Health (SDOH) Interventions SDOH Screenings   Food Insecurity: No Food Insecurity (05/13/2023)  Housing: Unknown (05/13/2023)  Transportation Needs: No Transportation Needs (05/13/2023)  Utilities: Not At Risk (05/13/2023)  Depression (PHQ2-9): Medium Risk (05/03/2023)  Financial Resource Strain: High Risk (08/15/2022)  Physical Activity: Insufficiently Active (05/17/2022)   [facility]  Social Connections: Socially Isolated (05/15/2023)  Stress: Stress Concern Present (05/17/2022)   [facility], [facility], [facility], [facility]   Tobacco Use: Low Risk  (05/17/2023)    Readmission Risk Interventions     No data to display

## 2023-05-23 NOTE — Progress Notes (Signed)
 Physical Therapy Treatment Patient Details Name: Donna Snow MRN: [id number] DOB: 05/13/1951 Today's Date: 05/23/2023   History of Present Illness Pt is a 73 y/o female who presents 05/13/2023 with LLE cellulitis. Pt being treated for L knee pseudogout. PMH significant for Abdominal wall abscess and cellulitis, anxiety, CHF, DM, HTN, obesity, seizures, stroke, TKA.    PT Comments  Pt in bed upon arrival and agreeable to PT session. Worked on LE strength, transfers, and pre-gait training in today's session. Pt continues to required ModAx2 for bed mobility 2/2 L knee pain and general deconditioning. Pt needed multiple verbal cues for sequencing as pt would state I don't know what I'm doing. Pt was hesitant about mobility and needed encouragement to stand. Pt was able to stand with MinAx2 and RW. Upon standing, pt required CGA to maintain balance. Pt was agreeable to weight shift bilaterally, but declined attempting to take steps. Pt was able to complete minimal L LE exercises with moderate increase in pain. Pt is progressing slowly towards goals. Acute PT to follow.      If plan is discharge home, recommend the following: Two people to help with walking and/or transfers;A lot of help with bathing/dressing/bathroom;Assistance with cooking/housework;Assist for transportation;Help with stairs or ramp for entrance   Can travel by private vehicle     No  Equipment Recommendations  BSC/3in1       Precautions / Restrictions Precautions Precautions: Fall Restrictions Weight Bearing Restrictions Per Provider Order: No     Mobility  Bed Mobility Overal bed mobility: Needs Assistance Bed Mobility: Supine to Sit, Sit to Supine     Supine to sit: Mod assist, +2 for physical assistance, +2 for safety/equipment, Used rails, HOB elevated Sit to supine: Mod assist, +2 for physical assistance, +2 for safety/equipment, HOB elevated   General bed mobility comments: cues for sequencing for bed  mobility, pt able to minimally assist with bringing L LE towards EOB. ModAx2 to complete moving LE's off EOB and for trunk elevation. Used bed pad to shift hips. Pt requested to hold L LE in ext. once seated EOB. ModAx2 for return to supine for trunk descent and LE management    Transfers Overall transfer level: Needs assistance Equipment used: Rolling walker (2 wheels) Transfers: Sit to/from Stand Sit to Stand: +2 physical assistance, +2 safety/equipment, From elevated surface, Min assist      General transfer comment: MinAx2 for boost up and rise. Cues for hand placement.    Ambulation/Gait    Pre-gait activities: B weight shifts x5, CGAx2 for safety General Gait Details: pt declined taking steps       Balance Overall balance assessment: Needs assistance Sitting-balance support: Feet supported, No upper extremity supported Sitting balance-Leahy Scale: Fair     Standing balance support: Bilateral upper extremity supported, During functional activity, Reliant on assistive device for balance Standing balance-Leahy Scale: Poor Standing balance comment: reliant on RW     Cognition Arousal: Alert Behavior During Therapy: Anxious Overall Cognitive Status: Within Functional Limits for tasks assessed       Exercises General Exercises - Lower Extremity Heel Slides: AAROM, Left, Supine (3 reps) Hip ABduction/ADduction: AAROM, Left, Supine (3 reps)    General Comments General comments (skin integrity, edema, etc.): VSS on RA, pt anxious about mobility. Repeatedly asked about what was going to happen next. Stated I don't know what to do.      Pertinent Vitals/Pain Pain Assessment Pain Assessment: Faces Faces Pain Scale: Hurts even more Pain Location: L knee Pain  Descriptors / Indicators: Sharp, Grimacing, Guarding, Throbbing Pain Intervention(s): Limited activity within patient's tolerance, Monitored during session, Repositioned, Patient requesting pain meds-RN notified      PT Goals (current goals can now be found in the care plan section) Acute Rehab PT Goals Patient Stated Goal: Decrease pain, go home PT Goal Formulation: With patient Time For Goal Achievement: 05/31/23 Potential to Achieve Goals: Good Progress towards PT goals: Progressing toward goals    Frequency    Min 1X/week       AM-PAC PT 6 Clicks Mobility   Outcome Measure  Help needed turning from your back to your side while in a flat bed without using bedrails?: Total Help needed moving from lying on your back to sitting on the side of a flat bed without using bedrails?: Total Help needed moving to and from a bed to a chair (including a wheelchair)?: Total Help needed standing up from a chair using your arms (e.g., wheelchair or bedside chair)?: Total Help needed to walk in hospital room?: Total Help needed climbing 3-5 steps with a railing? : Total 6 Click Score: 6    End of Session Equipment Utilized During Treatment: Gait belt Activity Tolerance: Patient limited by pain;Other (comment) (anxiety) Patient left: in bed;with call bell/phone within reach;with bed alarm set Nurse Communication: Mobility status;Patient requests pain meds PT Visit Diagnosis: Unsteadiness on feet (R26.81);Pain Pain - Right/Left: Left Pain - part of body: Knee;Leg     Time: 8594-8571 PT Time Calculation (min) (ACUTE ONLY): 23 min  Charges:    $Therapeutic Exercise: 8-22 mins $Therapeutic Activity: 8-22 mins PT General Charges $$ ACUTE PT VISIT: 1 Visit                     Kate ORN, PT, DPT Secure Chat Preferred  Rehab Office [phone]   Kate BRAVO Wendolyn 05/23/2023, 2:37 PM

## 2023-05-23 NOTE — Progress Notes (Signed)
 HD#9 SUBJECTIVE:  Patient Summary: Donna Snow is a 73 y.o. female with pertinent PMH of 2 diabetes on insulin  who presented with left leg pain and is admitted for cellulitis.    Overnight Events: None  Pt Concerns: Pain persists.  There may be marginal improvements from yesterday.  She is able to move the left knee more.  She states that she is hesitant to use her pain medicine but also feels that it remains necessary. No fevers or chills.  OBJECTIVE:  Vital Signs: Vitals:   05/22/23 0806 05/22/23 1331 05/22/23 2048 05/23/23 0455  BP: (!) 115/34 (!) 112/57 (!) 130/50 131/68  Pulse: 84 77 71 68  Resp: 16 16 20 18   Temp: 98.6 F (37 C) 98.7 F (37.1 C) 99 F (37.2 C) 98.2 F (36.8 C)  TempSrc: Oral Oral Oral   SpO2: 92% 94% 97% 96%  Weight:      Height:       Supplemental O2: Room Air SpO2: 96 % O2 Flow Rate (L/min): 2 L/min  Filed Weights   05/18/23 0500 05/19/23 0455 05/20/23 0500  Weight: (!) 142.8 kg (!) 144.3 kg (!) 144.3 kg     Intake/Output Summary (Last 24 hours) at 05/23/2023 9292 Last data filed at 05/22/2023 2048 Gross per 24 hour  Intake --  Output 601 ml  Net -601 ml   Net IO Since Admission: -3,605.86 mL [05/23/23 0707]  Physical Exam: Physical Exam Constitutional:      General: She is not in acute distress.    Appearance: She is obese. She is not ill-appearing.  HENT:     Nose: Nose normal.  Cardiovascular:     Rate and Rhythm: Normal rate.     Pulses: Normal pulses.  Pulmonary:     Effort: Pulmonary effort is normal.     Breath sounds: No wheezing or rales.  Abdominal:     General: Abdomen is flat. There is no distension.     Tenderness: There is no abdominal tenderness.  Musculoskeletal:        General: Swelling and tenderness present.     Right lower leg: No edema.     Left lower leg: Edema present.     Comments: L lower leg cellulitis redness resolving, circumferential redness very minimal, there remains a red unulcerated rash on the  lateral L lower leg, but also less inflamed from admission. Severe tenderness to palpation in L knee persists but requires more pressure to induce. Still quite swollen, roughly stable, very limited ROM of L leg. No erythema.  Skin:    General: Skin is warm and dry.     Findings: Rash present. Greatly improved redness at distal L LE per original cellulitis. Neurological:     General: No focal deficit present.     Mental Status: She is alert.  Psychiatric:        Mood and Affect: Mood normal.        Behavior: Behavior normal.   Patient Lines/Drains/Airways Status     Active Line/Drains/Airways     Name Placement date Placement time Site Days   Peripheral IV 05/14/23 20 G 1.88 Left;Posterior Forearm 05/14/23  2333  Forearm  9   External Urinary Catheter 05/19/23  1014  --  4             ASSESSMENT/PLAN:  Assessment: Principal Problem:   Pseudogout Active Problems:   Hyperglycemia due to diabetes mellitus (HCC)   Type 2 diabetes mellitus (HCC)   AF (paroxysmal  atrial fibrillation) (HCC)   Chronic anticoagulation - on coumadin  for afib   OSA on CPAP   GAD (generalized anxiety disorder)   Cellulitis of left lower extremity from knee to ankle   Sepsis without acute organ dysfunction (HCC)   Chronic constipation   Hyperglycemia   Dog scratch   Hx of penicillin allergy   Lower extremity edema  Donna Snow is a 73 y.o. female with pertinent PMH of T2 diabetes on insulin  who presented with left leg pain and is admitted for cellulitis, now left knee pain c/f pseudogout.    Plan: Pseudogout  Sepsis Rule out due to LLE cellulitis (resolved) Cellulitis of left lower extremity (resolved) Bcx positive for Kocuria Rhizophila, contaminant Pain in left knee persists from pseudogout. Less suspicious for septic joint, reviewed by orthopedics and ID. Maximized pain medicines - dilaudid  oral and IV. Will continue steroids as well. - ID signed off, Ortho following, thank you. She will  follow up with Dr. Georgina in his office. - Done with Abx per ID, Vanc/Cefepim reduced to cefdinir , 9 days of Abx last day 1/6 - Colchicine  0.6 BID - Continue prednisone  50qd. Monitor sugars. - For further pain, Tylenol  1000 every 6 hours, Dilaudid  4 mg po every 2h as needed, Dilaudid  1-2mg  IV PRN when po not enough. Tizanidine  4mg  TID. Bowel regimen: Miralax  BID, Senna-docusate BID, fleet enema prn. Last BM 1/7. - Trend white count and blood cultures, rechecked CRP/SedR with reduction yesterday   T2DM AGMA - possible mild DKA (resolved) Mild AG day 1 admission resolved with insulin . Most recent A1c is 7.4%. Takes Humulin R  11-20 units TID before meals, typically 300 units daily at home.   - Semglee  24 -> 28 BID on 1/7 w/ start of prednisone , SSI resistant. Novolog  meal coverage 6 units tid with meals (hold if patient eats less than 50% or NPO).   Ambulatory dysfunction due to severe pain - Some difficulty with walking, getting to toilet. RN assisting. PT consult, recommending SNF.  Constipation - Stable. 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  Often needs fleet enemas in order to have BM. Has bedside commode. - Senna-docusate 1 tablet oral BID, Miralax  BID, Fleet enema prn. Last BM 1/7.   Dyspnea associated with Anxiety and OSA - Stable. Intermittent dyspnea but no hypoxia. CPAP provided for sleep. Upon dyspnea, reassurance about overall plan, will monitor need for further anxiolytics, oxygen available for comfort. - Continue home Buspar  20mg  TID. Atarax  50mg     Paroxysmal Afib - Stable. Rate/Rhythm controlled without medicine. She takes warfarin outpatient. Continuing per pharmacy.   Chronic HFpEF without exacerbation - Stable. Resume lasix  60 daily. Hold losartan  50mg  and spironolactone  12.5 mg every day in setting of low normal Bps. Check daily weights.   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  Will continue  topiramate  200mg  po BID, Phenytoin  400mg  po qd. HLD - Continue home rosuvastatin  40mg  every day and zetia  10mg  every day  GERD - Continue home Protonix  40mg  qd Night Terrors - Home prazosin  1mg  am and 2mg  at bedtime Insomnia - Trazodone  200 prn at bedtime, home dose is 300mg  HTN - Holding home medicines per above  Best Practice: Diet: Regular diet IVF: none VTE: warfarin Code: Full AB: 9 Days Vanc/Cefepime  -> cefdinir  -> end 1/6 Therapy Recs: PT rec SNF DISPO: Anticipated discharge in 1-2 days to SNF pending pain control  Signature: Lonni Africa, D.O.  Internal Medicine Resident, PGY-1 Donna Snow  Internal Medicine Residency  Pager: [phone] 7:07 AM, 05/23/2023   Please contact the on call pager after 5 pm and on weekends at [phone].

## 2023-05-23 NOTE — Inpatient Diabetes Management (Signed)
 Inpatient Diabetes Program Recommendations  AACE/ADA: New Consensus Statement on Inpatient Glycemic Control (2015)  Target Ranges:  Prepandial:   less than 140 mg/dL      Peak postprandial:   less than 180 mg/dL (1-2 hours)      Critically ill patients:  140 - 180 mg/dL   Lab Results  Component Value Date   GLUCAP 269 (H) 05/23/2023   HGBA1C 8.1 (H) 02/14/2022    Review of Glycemic Control  Latest Reference Range & Units 05/22/23 06:08 05/22/23 11:33 05/22/23 16:05 05/22/23 21:47 05/23/23 08:04  Glucose-Capillary 70 - 99 mg/dL 838 (H) 761 (H) 814 (H) 302 (H) 269 (H)   Diabetes history: DM  Outpatient Diabetes medications:  U 500 insulin - 12-20 units tid with meals  Metformin 500 mg bid Ozempic ? Current orders for Inpatient glycemic control:  Novolog  0-20 units tid with meals and HS Semglee  28 units bid Prednisone  50 mg daily  Inpatient Diabetes Program Recommendations:    Please consider adding Novolog  6 units tid with meals (for meal coverage - hold if patient eats less than 50% or NPO).   Thanks,  Randall Bullocks, RN, BC-ADM Inpatient Diabetes Coordinator Pager [phone]  (8a-5p)

## 2023-05-23 NOTE — Progress Notes (Signed)
 PHARMACY - ANTICOAGULATION CONSULT NOTE  Pharmacy Consult for warfarin Indication: atrial fibrillation  Allergies  Allergen Reactions   Latex Other (See Comments)    Welts - cant wear them, but can be touched by someone who wears them.   Vital Signs: Temp: 98.2 F (36.8 C) (01/08 0455) Temp Source: Oral (01/07 2048) BP: 131/68 (01/08 0455) Pulse Rate: 68 (01/08 0455)  Labs: Recent Labs    05/20/23 0810 05/21/23 0500 05/22/23 0630 05/23/23 0610  HGB 12.7 11.9* 12.2 12.3  HCT 38.2 36.6 37.7 38.1  PLT 439* 432* 483* 488*  LABPROT  --  26.0* 28.2* 25.4*  INR  --  2.4* 2.6* 2.3*  CREATININE 0.75  --  0.83  --     Estimated Creatinine Clearance: 88.8 mL/min (by C-G formula based on SCr of 0.83 mg/dL).   Medical History: Past Medical History:  Diagnosis Date   Abdominal wall abscess 10/15/2020   Abdominal wall cellulitis 10/15/2020   Anxiety    Cellulitis 06/04/2021   Cellulitis of abdominal wall 02/14/2022   CHF (congestive heart failure) (HCC)    Diabetes mellitus    Hypercholesteremia    Hypertension    Obesity    Seizures (HCC)    Stroke [facility])     Assessment: Donna Snow is a 72yoF who presented from home due to cellulitis and possible UTI. Patient is on chronic warfarin for Afib and also Dilantin  for history of seizures. Pharmacy consulted to manage warfarin while inpatient. Patient took warfarin dose 12/29. Per patient, 5mg  still caused INR to be high so dose was reduced to ~3mg . Warfarin held 1/2 >>1/4 for supratherapeutic INR.  05/22/23: INR therapeutic at 2.3. Home penytoin resumed. CBC stable. No meals documented in last 24 hours.   Goal of Therapy:  INR 2-3 Monitor platelets by anticoagulation protocol: Yes   Plan:  Warfarin 3 mg this evening Monitor daily INR, CBC, clinical course, s/sx of bleed, PO intake/diet, and for any new DDIs  Josefa Range, PharmD PGY1 Pharmacy Resident 05/23/2023 7:17 AM

## 2023-05-23 NOTE — Progress Notes (Addendum)
 Initial Nutrition Assessment  DOCUMENTATION CODES:   Morbid obesity  INTERVENTION:   -Continue Carb modified/heart healthy diet with encouragement.   -Supplement meals with Glucerna Shake po TID, each supplement provides 220 kcal and 10 grams of protein.  -MVI/Minerals-1 Tab daily due to inadequate intakes greater than two weeks.  -Consider hemoglobin A1C draw.   NUTRITION DIAGNOSIS:   Inadequate oral intake related to poor appetite, acute illness, other (see comment) (reported pain) as evidenced by per patient/family report, energy intake < or equal to 50% for > or equal to 5 days.    GOAL:   Patient will meet greater than or equal to 90% of their needs    MONITOR:   PO intake, Weight trends, Supplement acceptance, Skin, Labs  REASON FOR ASSESSMENT:   Consult Assessment of nutrition requirement/status, Poor PO  ASSESSMENT: 73 y/o female presented to hospital due to concerns of left leg pain and swelling. Cellulitis of left lower extremity, from knee to ankle secondary to unintentional dog scratch about one week prior to admission. Pain in left knee concerning for pseudogout. PMH: DM2, chronic back pain, atrial flutter, CHF, HLD, HTN, seizures, CVA, depression, COPD, abdominal surgery, cholecystectomy.    Intakes recorded 43% x 7 meals. Patient informs she has not been eating well for approximately 2.5 weeks, consuming about half as much as she usually eats due to loss of appetite she thinks is due to reported pain. Poor dentition, family member to bring in her dentures from home. States she is finding enough soft foods on current diet. Receiving automatic trays as she will sleep through meals per RN. Patient notes she is feeling very tired and not appropriate for education at this time. Attached education materials for consistent CHO and heart healthy eating  to discharge instructions.  Per EMR, no significant weight changes past 36 months.  Edema non-pitting generalized,  RUE, LUE, RLE, 1+ LLE.  Medications reviewed and include lasix , novolog  SS 3x daily with meals and at bedtime, insulin  glargine-yfgn 28 units 2x daily, PPI, Miralax , prednisone , Senokot-S, warfarin, PRN zofran .  Labs: CBG 161-302 past 24 hrs  NUTRITION - FOCUSED PHYSICAL EXAM: note mild muscle loss may be related to sedentary lifestyle rather than nutritional.  Flowsheet Row Most Recent Value  Orbital Region No depletion  Upper Arm Region No depletion  Thoracic and Lumbar Region No depletion  Buccal Region No depletion  Temple Region No depletion  Clavicle Bone Region No depletion  Clavicle and Acromion Bone Region No depletion  Scapular Bone Region Mild depletion  Dorsal Hand No depletion  Patellar Region Mild depletion  Anterior Thigh Region Mild depletion  Posterior Calf Region Mild depletion       Diet Order:   Diet Order             Diet heart healthy/carb modified Room service appropriate? Yes; Fluid consistency: Thin  Diet effective now                   EDUCATION NEEDS:      Skin:  Skin Assessment: Skin Integrity Issues: Skin Integrity Issues:: Other (Comment) Other: erythema/redness to abdomen, bilateral breast, buttock; rash to L leg  Last BM:  05/22/2023  Height:   Ht Readings from Last 1 Encounters:  05/15/23 5' 4 (1.626 m)    Weight:   Wt Readings from Last 1 Encounters:  05/20/23 (!) 144.3 kg    Ideal Body Weight:  56.8 kg  BMI:  Body mass index is 54.61 kg/m.  Estimated Nutritional Needs:   Kcal:  1750-2000 kcal/day  Protein:  74-85 gm/day  Fluid:  1750-2000 mL/day    Elveria Sable, RDLD Clinical Dietitian If unable to reach, please contact RD Inpatient secure chat group between 8 am-4 pm daily

## 2023-05-24 DIAGNOSIS — R739 Hyperglycemia, unspecified: Secondary | ICD-10-CM | POA: Diagnosis not present

## 2023-05-24 DIAGNOSIS — M1120 Other chondrocalcinosis, unspecified site: Secondary | ICD-10-CM | POA: Diagnosis not present

## 2023-05-24 LAB — RENAL FUNCTION PANEL
Albumin: 2.1 g/dL — ABNORMAL LOW (ref 3.5–5.0)
Anion gap: 8 (ref 5–15)
BUN: 12 mg/dL (ref 8–23)
CO2: 30 mmol/L (ref 22–32)
Calcium: 8.7 mg/dL — ABNORMAL LOW (ref 8.9–10.3)
Chloride: 98 mmol/L (ref 98–111)
Creatinine, Ser: 0.77 mg/dL (ref 0.44–1.00)
GFR, Estimated: >60 mL/min (ref >60)
Glucose, Bld: 191 mg/dL — ABNORMAL HIGH (ref 70–99)
Phosphorus: 3.4 mg/dL (ref 2.5–4.6)
Potassium: 3.7 mmol/L (ref 3.5–5.1)
Sodium: 136 mmol/L (ref 135–145)

## 2023-05-24 LAB — PROTIME-INR
INR: 2.5 — ABNORMAL HIGH (ref 0.8–1.2)
Prothrombin Time: 27.1 s — ABNORMAL HIGH (ref 11.4–15.2)

## 2023-05-24 LAB — GLUCOSE, CAPILLARY
Glucose-Capillary: 168 mg/dL — ABNORMAL HIGH (ref 70–99)
Glucose-Capillary: 212 mg/dL — ABNORMAL HIGH (ref 70–99)
Glucose-Capillary: 305 mg/dL — ABNORMAL HIGH (ref 70–99)
Glucose-Capillary: 251 mg/dL — ABNORMAL HIGH (ref 70–99)

## 2023-05-24 LAB — SURGICAL PATHOLOGY

## 2023-05-24 MED ORDER — LIDOCAINE 5 % EX PTCH
1.0000 | MEDICATED_PATCH | CUTANEOUS | Status: DC
  Administered 2023-05-24: 1 via TRANSDERMAL
  Filled 2023-05-24: qty 1

## 2023-05-24 MED ORDER — GABAPENTIN 100 MG PO CAPS
100.0000 mg | ORAL_CAPSULE | Freq: Three times a day (TID) | ORAL | Status: DC
  Administered 2023-05-24 – 2023-05-25 (×4): 100 mg via ORAL
  Filled 2023-05-24 (×4): qty 1

## 2023-05-24 MED ORDER — WARFARIN SODIUM 3 MG PO TABS
3.0000 mg | ORAL_TABLET | Freq: Once | ORAL | Status: AC
  Administered 2023-05-24: 3 mg via ORAL
  Filled 2023-05-24: qty 1

## 2023-05-24 MED ORDER — HYDROMORPHONE HCL 2 MG PO TABS
4.0000 mg | ORAL_TABLET | ORAL | Status: DC | PRN
  Administered 2023-05-24 – 2023-05-28 (×16): 4 mg via ORAL
  Filled 2023-05-24 (×18): qty 2

## 2023-05-24 MED ORDER — LIDOCAINE 5 % EX PTCH
1.0000 | MEDICATED_PATCH | Freq: Two times a day (BID) | CUTANEOUS | Status: DC
  Administered 2023-05-24 – 2023-05-29 (×10): 1 via TRANSDERMAL
  Filled 2023-05-24 (×10): qty 1

## 2023-05-24 NOTE — Progress Notes (Signed)
 PHARMACY - ANTICOAGULATION CONSULT NOTE  Pharmacy Consult for warfarin Indication: atrial fibrillation  Allergies  Allergen Reactions   Latex Other (See Comments)    Welts - cant wear them, but can be touched by someone who wears them.   Vital Signs: Temp: 98.1 F (36.7 C) (01/09 0544) Temp Source: Oral (01/09 0544) BP: 127/53 (01/09 0544) Pulse Rate: 69 (01/09 0544)  Labs: Recent Labs    05/22/23 0630 05/23/23 0610 05/24/23 0525  HGB 12.2 12.3  --   HCT 37.7 38.1  --   PLT 483* 488*  --   LABPROT 28.2* 25.4* 27.1*  INR 2.6* 2.3* 2.5*  CREATININE 0.83 0.93 0.77    Estimated Creatinine Clearance: 92.1 mL/min (by C-G formula based on SCr of 0.77 mg/dL).   Medical History: Past Medical History:  Diagnosis Date   Abdominal wall abscess 10/15/2020   Abdominal wall cellulitis 10/15/2020   Anxiety    Cellulitis 06/04/2021   Cellulitis of abdominal wall 02/14/2022   CHF (congestive heart failure) (HCC)    Diabetes mellitus    Hypercholesteremia    Hypertension    Obesity    Seizures (HCC)    Stroke [facility])     Assessment: Ever Halberg is a 72yoF who presented from home due to cellulitis and possible UTI. Patient is on chronic warfarin for Afib and also Dilantin  for history of seizures. Pharmacy consulted to manage warfarin while inpatient. Patient took warfarin dose 12/29. Per patient, 5mg  still caused INR to be high so dose was reduced to ~3mg . Warfarin held 1/2 >>1/4 for supratherapeutic INR.  05/23/23: INR therapeutic at 2.5. Home penytoin resumed. CBC stable from yesterday. Will space out CBC checks to MWF. 50% of meals consumed in prior 24 hours.   Goal of Therapy:  INR 2-3 Monitor platelets by anticoagulation protocol: Yes   Plan:  Warfarin 3 mg this evening Monitor daily INR, CBC, clinical course, s/sx of bleed, PO intake/diet, and for any new DDIs  Josefa Range, PharmD PGY1 Pharmacy Resident 05/24/2023 7:16 AM

## 2023-05-24 NOTE — Plan of Care (Signed)

## 2023-05-24 NOTE — TOC Progression Note (Signed)
 Transition of Care [facility]) - Progression Note    Patient Details  Name: Donna Snow MRN: [id number] Date of Birth: Sep 07, 1950  Transition of Care [facility]) CM/SW Contact  Bridget Cordella Simmonds, LCSW Phone Number: 05/24/2023, 8:53 AM  Clinical Narrative:   PASSR received: [id number].    Expected Discharge Plan: Skilled Nursing Facility Barriers to Discharge: Continued Medical Work up, SNF Pending bed offer  Expected Discharge Plan and Services In-house Referral: Clinical Social Work   Post Acute Care Choice: Skilled Nursing Facility Living arrangements for the past 2 months: Single Family Home                                       Social Determinants of Health (SDOH) Interventions SDOH Screenings   Food Insecurity: No Food Insecurity (05/13/2023)  Housing: Unknown (05/13/2023)  Transportation Needs: No Transportation Needs (05/13/2023)  Utilities: Not At Risk (05/13/2023)  Depression (PHQ2-9): Medium Risk (05/03/2023)  Financial Resource Strain: High Risk (08/15/2022)  Physical Activity: Insufficiently Active (05/17/2022)   [facility], [facility]  Social Connections: Socially Isolated (05/15/2023)  Stress: Stress Concern Present (05/17/2022)   [facility], [facility], [facility], [facility]  Tobacco Use: Low Risk  (05/17/2023)    Readmission Risk Interventions     No data to display

## 2023-05-24 NOTE — Plan of Care (Signed)
 Pt is teary and anxious this morning. Pt stated I know I am going to disappointment the doctors by taking the pain medicine, I'm trying but my body begs me to have it. Also shared to this RN that she feels anxious about the discharge process and that she's not ready to go if pain still persists. RN offers empathy and active listening. Also teaches alternative ways to provide pain relief.  Problem: Education: Goal: Knowledge of General Education information will improve Description: Including pain rating scale, medication(s)/side effects and non-pharmacologic comfort measures Outcome: Progressing   Problem: Health Behavior/Discharge Planning: Goal: Ability to manage health-related needs will improve Outcome: Progressing   Problem: Clinical Measurements: Goal: Ability to maintain clinical measurements within normal limits will improve Outcome: Progressing   Problem: Pain Management: Goal: General experience of comfort will improve Outcome: Progressing   Problem: Safety: Goal: Ability to remain free from injury will improve Outcome: Progressing

## 2023-05-24 NOTE — Progress Notes (Addendum)
 HD#10 SUBJECTIVE:  Patient Summary: Donna Snow is a 73 y.o. female with pertinent PMH of 2 diabetes on insulin  who presented with left leg pain was admitted for cellulitis.    Overnight Events: None   Pt Concerns: Pain persists and is severe. She still has swelling. She reports having cried overnight due to the pain and feels like a failure for having such pain, despite reassurance. No fevers or chills. She did well working with PT yesterday - stood upright and bore weight.  OBJECTIVE:  Vital Signs: Vitals:   05/23/23 1413 05/23/23 2121 05/24/23 0544 05/24/23 0918  BP: 132/70 134/61 (!) 127/53 (!) 117/55  Pulse: 74 (!) 57 69 69  Resp:  18 20 16   Temp: 98 F (36.7 C) 98.4 F (36.9 C) 98.1 F (36.7 C) 97.7 F (36.5 C)  TempSrc: Oral Oral Oral Oral  SpO2: 100% 96% 97% 93%  Weight:      Height:       Supplemental O2: Room Air SpO2: 93 % O2 Flow Rate (L/min): 1 L/min  Filed Weights   05/18/23 0500 05/19/23 0455 05/20/23 0500  Weight: (!) 142.8 kg (!) 144.3 kg (!) 144.3 kg     Intake/Output Summary (Last 24 hours) at 05/24/2023 1146 Last data filed at 05/23/2023 1500 Gross per 24 hour  Intake 240 ml  Output --  Net 240 ml   Net IO Since Admission: -3,125.86 mL [05/24/23 1146]  Physical Exam: Physical Exam Constitutional:      General: She is not in acute distress.    Appearance: She is obese. She is not ill-appearing.  HENT:     Nose: Nose normal.  Cardiovascular:     Rate and Rhythm: Normal rate.     Pulses: Normal pulses.  Pulmonary:     Effort: Pulmonary effort is normal.     Breath sounds: No wheezing or rales.  Abdominal:     General: Abdomen is flat. There is no distension.     Tenderness: There is no abdominal tenderness.  Musculoskeletal:        General: Swelling and tenderness present.     Right lower leg: No edema.     Left lower leg: Edema present.     Comments: L lower leg cellulitis redness resolving, circumferential redness very minimal, there  remains a red unulcerated rash on the lateral L lower leg, but also less inflamed from admission. Severe tenderness to palpation in L knee persists but requires more pressure to induce from before. Still swollen, still warm but less acutely hot, very limited ROM of L leg. No erythema.  Skin:    General: Skin is warm and dry.     Findings: Rash present. Mild redness that is much improved at distal L LE per original cellulitis. Neurological:     General: No focal deficit present.     Mental Status: She is alert.  Psychiatric:        Mood and Affect: Mood normal.        Behavior: Behavior normal.   Patient Lines/Drains/Airways Status     Active Line/Drains/Airways     Name Placement date Placement time Site Days   Peripheral IV 05/14/23 20 G 1.88 Left;Posterior Forearm 05/14/23  2333  Forearm  10   External Urinary Catheter 05/19/23  1014  --  5             ASSESSMENT/PLAN:  Assessment: Principal Problem:   Pseudogout Active Problems:   Hyperglycemia due to diabetes mellitus (HCC)  Type 2 diabetes mellitus (HCC)   AF (paroxysmal atrial fibrillation) (HCC)   Chronic anticoagulation - on coumadin  for afib   OSA on CPAP   GAD (generalized anxiety disorder)   Cellulitis of left lower extremity from knee to ankle   Sepsis without acute organ dysfunction (HCC)   Chronic constipation   Hyperglycemia   Dog scratch   Hx of penicillin allergy   Lower extremity edema  Donna Snow is a 73 y.o. female with pertinent PMH of T2 diabetes on insulin  who presented with left leg pain and is admitted for cellulitis, now left knee pain c/f pseudogout.    Plan: Pseudogout  Sepsis Rule out due to LLE cellulitis (resolved) Cellulitis of left lower extremity (resolved) Bcx positive for Kocuria Rhizophila, contaminant Pain in left knee persists from pseudogout. She is on her third day of prednisone , 50mg  every day, for this. Less suspicious for septic joint, reviewed by orthopedics and ID.  Maximized pain medicines - dilaudid  oral and will stop IV today. Continue steroids. - Colchicine  0.6 BID - Continue prednisone  50qd. Monitor sugars. - Gabapentin  100 TID - For further pain, Tylenol  1000 every 6 hours, Dilaudid  4 mg po every 3h as needed. Tizanidine  4mg  TID. Bowel regimen: Miralax  BID, Senna-docusate BID, fleet enema prn. Last BM 1/7.   T2DM AGMA - possible mild DKA (resolved) Mild AG day 1 admission resolved with insulin . Most recent A1c is 7.4%. Takes Humulin R  11-20 units TID before meals, typically 300 units daily at home.   - Semglee  24 -> 28 BID on 1/7 w/ start of prednisone , SSI resistant. Sugars upper 100s.   Ambulatory dysfunction due to severe pain - Some difficulty with walking, getting to toilet. RN assisting. PT consult, recommended SNF.   Constipation - Stable. 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  Often needs fleet enemas in order to have BM. Has bedside commode. - Senna-docusate 1 tablet oral BID, Miralax  BID, Fleet enema prn. Last BM 1/7.   Dyspnea associated with Anxiety and OSA - Stable. Intermittent dyspnea but no hypoxia. CPAP provided for sleep. Upon dyspnea, reassurance about overall plan, will monitor need for further anxiolytics, oxygen available for comfort. - Continue home Buspar  20mg  TID. Atarax  50mg     Paroxysmal Afib - Stable. Rate/Rhythm controlled without medicine. She takes warfarin outpatient. Continuing per pharmacy.   Chronic HFpEF without exacerbation - Stable. Resume lasix  60 daily. Hold losartan  50mg  and spironolactone  12.5 mg every day in setting of low normal Bps. Check daily weights.   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  Will continue topiramate  200mg  po BID, Phenytoin  400mg  po qd. HLD - Continue home rosuvastatin  40mg  every day and zetia  10mg  every day  GERD - Continue home Protonix  40mg  qd Night Terrors - Home prazosin  1mg  am and 2mg  at  bedtime Insomnia - Trazodone  200 prn at bedtime, home dose is 300mg  HTN - Holding home medicines per above   Best Practice: Diet: Regular diet IVF: none VTE: warfarin Code: Full AB: 9 Days Vanc/Cefepime  -> cefdinir  -> end 1/6 Therapy Recs: PT rec SNF DISPO: Anticipated discharge in 1-2 days to SNF pending pain control  Signature: Lonni Africa, D.O.  Internal Medicine Resident, PGY-1 [facility]  Pager: [phone] 11:46 AM, 05/24/2023   Please contact the on call pager after 5 pm and on weekends at [phone].

## 2023-05-24 NOTE — Progress Notes (Signed)
 Mobility Specialist: Progress Note   05/24/23 1620  Mobility  Range of Motion/Exercises Active Assistive;Right leg;Left leg  Activity Response Tolerated well  Mobility Referral Yes  Mobility visit 1 Mobility  Mobility Specialist Start Time (ACUTE ONLY) 1443  Mobility Specialist Stop Time (ACUTE ONLY) 1514  Mobility Specialist Time Calculation (min) (ACUTE ONLY) 31 min    Pt was agreeable to mobility session - received in bed. Performed leg lifts, lying marches, and ad/abduction exercises with assist from MS. Heavier assistance needed for LLE than R. C/o muscle fatigue and soreness. Left in bed with all needs met, call bell in reach.   Ileana Lute Mobility Specialist Please contact via SecureChat or Rehab office at [phone]

## 2023-05-25 DIAGNOSIS — Z7901 Long term (current) use of anticoagulants: Secondary | ICD-10-CM | POA: Diagnosis not present

## 2023-05-25 DIAGNOSIS — E1165 Type 2 diabetes mellitus with hyperglycemia: Secondary | ICD-10-CM | POA: Diagnosis not present

## 2023-05-25 DIAGNOSIS — M1120 Other chondrocalcinosis, unspecified site: Secondary | ICD-10-CM | POA: Diagnosis not present

## 2023-05-25 LAB — BASIC METABOLIC PANEL
Anion gap: 10 (ref 5–15)
BUN: 14 mg/dL (ref 8–23)
CO2: 31 mmol/L (ref 22–32)
Calcium: 9 mg/dL (ref 8.9–10.3)
Chloride: 97 mmol/L — ABNORMAL LOW (ref 98–111)
Creatinine, Ser: 0.78 mg/dL (ref 0.44–1.00)
GFR, Estimated: >60 mL/min (ref >60)
Glucose, Bld: 223 mg/dL — ABNORMAL HIGH (ref 70–99)
Potassium: 3.9 mmol/L (ref 3.5–5.1)
Sodium: 138 mmol/L (ref 135–145)

## 2023-05-25 LAB — CBC
HCT: 39.8 % (ref 36.0–46.0)
Hemoglobin: 12.4 g/dL (ref 12.0–15.0)
MCH: 27.4 pg (ref 26.0–34.0)
MCHC: 31.2 g/dL (ref 30.0–36.0)
MCV: 88.1 fL (ref 80.0–100.0)
Platelets: 534 10*3/uL — ABNORMAL HIGH (ref 150–400)
RBC: 4.52 MIL/uL (ref 3.87–5.11)
RDW: 13.2 % (ref 11.5–15.5)
WBC: 7.3 10*3/uL (ref 4.0–10.5)
nRBC: 0 % (ref 0.0–0.2)

## 2023-05-25 LAB — PROTIME-INR
INR: 2.3 — ABNORMAL HIGH (ref 0.8–1.2)
Prothrombin Time: 25.5 s — ABNORMAL HIGH (ref 11.4–15.2)

## 2023-05-25 LAB — GLUCOSE, CAPILLARY
Glucose-Capillary: 237 mg/dL — ABNORMAL HIGH (ref 70–99)
Glucose-Capillary: 255 mg/dL — ABNORMAL HIGH (ref 70–99)
Glucose-Capillary: 370 mg/dL — ABNORMAL HIGH (ref 70–99)
Glucose-Capillary: 196 mg/dL — ABNORMAL HIGH (ref 70–99)

## 2023-05-25 MED ORDER — WARFARIN SODIUM 2.5 MG PO TABS
2.5000 mg | ORAL_TABLET | Freq: Every day | ORAL | Status: DC
  Administered 2023-05-25 – 2023-05-27 (×3): 2.5 mg via ORAL
  Filled 2023-05-25 (×3): qty 1

## 2023-05-25 MED ORDER — NAPROXEN 250 MG PO TABS: 500.0000 mg | ORAL_TABLET | Freq: Three times a day (TID) | ORAL | Status: DC

## 2023-05-25 MED ORDER — NAPROXEN 250 MG PO TABS
500.0000 mg | ORAL_TABLET | Freq: Two times a day (BID) | ORAL | Status: DC
  Administered 2023-05-25 – 2023-05-28 (×6): 500 mg via ORAL
  Filled 2023-05-25 (×6): qty 2

## 2023-05-25 MED ORDER — INSULIN ASPART 100 UNIT/ML IJ SOLN
6.0000 [IU] | Freq: Three times a day (TID) | INTRAMUSCULAR | Status: DC
  Administered 2023-05-25 – 2023-05-29 (×10): 6 [IU] via SUBCUTANEOUS

## 2023-05-25 MED ORDER — GABAPENTIN 300 MG PO CAPS
300.0000 mg | ORAL_CAPSULE | Freq: Three times a day (TID) | ORAL | Status: DC
  Administered 2023-05-25 – 2023-05-29 (×12): 300 mg via ORAL
  Filled 2023-05-25 (×12): qty 1

## 2023-05-25 MED ORDER — KETOROLAC TROMETHAMINE 15 MG/ML IJ SOLN
30.0000 mg | Freq: Once | INTRAMUSCULAR | Status: AC
  Administered 2023-05-25: 30 mg via INTRAVENOUS
  Filled 2023-05-25: qty 2

## 2023-05-25 NOTE — Progress Notes (Signed)
 Physical Therapy Treatment Patient Details Name: Donna Snow MRN: [id number] DOB: 11-14-1950 Today's Date: 05/25/2023   History of Present Illness Pt is a 73 y/o female who presents 05/13/2023 with LLE cellulitis. Pt being treated for L knee pseudogout. PMH significant for Abdominal wall abscess and cellulitis, anxiety, CHF, DM, HTN, obesity, seizures, stroke, TKA.    PT Comments  The pt was agreeable to session with focus on getting out of bed to use the bathroom. The pt was able to demo improvement in bed mobility, needing only minA physical assistance this session in addition to sequential cues for movement of extremities and use of bed features. The pt was then able to complete x3 sit-stand transfers and x3 step-pivot transfers with minA of 1-2. The pt had no buckling of LLE, but was limited in activity tolerance, step clearance, and balance due to pain and fatigue. Will continue to benefit from skilled PT to progress OOB activity tolerance and independence.     If plan is discharge home, recommend the following: Two people to help with walking and/or transfers;A lot of help with bathing/dressing/bathroom;Assistance with cooking/housework;Assist for transportation;Help with stairs or ramp for entrance   Can travel by private vehicle     No  Equipment Recommendations  BSC/3in1    Recommendations for Other Services       Precautions / Restrictions Precautions Precautions: Fall Restrictions Weight Bearing Restrictions Per Provider Order: No     Mobility  Bed Mobility Overal bed mobility: Needs Assistance Bed Mobility: Supine to Sit     Supine to sit: Used rails, Min assist, +2 for safety/equipment, HOB elevated     General bed mobility comments: cues for rolling and movement of LE, use of rail, and use of LUE to push to sitting, but then only minA to complete movement    Transfers Overall transfer level: Needs assistance Equipment used: Rolling walker (2  wheels) Transfers: Sit to/from Stand, Bed to chair/wheelchair/BSC Sit to Stand: +2 physical assistance, +2 safety/equipment, From elevated surface, Min assist   Step pivot transfers: Min assist, +2 safety/equipment       General transfer comment: minA of 2 to rise initially, then minA of 1 with +2 for safety. minA of 1 with +2 for safety to take small steps to recliner, then [facility], then back to recliner    Ambulation/Gait Ambulation/Gait assistance: Min assist, +2 safety/equipment Gait Distance (Feet): 3 Feet (x3) Assistive device: Rolling walker (2 wheels) Gait Pattern/deviations: Step-to pattern, Decreased stride length, Trunk flexed Gait velocity: decreased Gait velocity interpretation: <1.31 ft/sec, indicative of household ambulator   General Gait Details: small lateral steps to R then L, no overt buckling but small steps with minimal clearance     Balance Overall balance assessment: Needs assistance Sitting-balance support: Feet supported, No upper extremity supported Sitting balance-Leahy Scale: Fair     Standing balance support: Bilateral upper extremity supported, During functional activity, Reliant on assistive device for balance Standing balance-Leahy Scale: Poor Standing balance comment: reliant on RW                            Cognition Arousal: Alert Behavior During Therapy: Anxious Overall Cognitive Status: Within Functional Limits for tasks assessed  General Comments General comments (skin integrity, edema, etc.): VSS on RA      Pertinent Vitals/Pain Pain Assessment Pain Assessment: Faces Faces Pain Scale: Hurts even more Pain Location: L knee Pain Descriptors / Indicators: Sharp, Grimacing, Guarding, Throbbing Pain Intervention(s): Premedicated before session, Monitored during session, Limited activity within patient's tolerance     PT Goals (current goals can now be found in  the care plan section) Acute Rehab PT Goals Patient Stated Goal: Decrease pain, go home PT Goal Formulation: With patient Time For Goal Achievement: 05/31/23 Potential to Achieve Goals: Good Progress towards PT goals: Progressing toward goals    Frequency    Min 1X/week       AM-PAC PT 6 Clicks Mobility   Outcome Measure  Help needed turning from your back to your side while in a flat bed without using bedrails?: A Lot Help needed moving from lying on your back to sitting on the side of a flat bed without using bedrails?: A Lot Help needed moving to and from a bed to a chair (including a wheelchair)?: A Lot Help needed standing up from a chair using your arms (e.g., wheelchair or bedside chair)?: A Lot Help needed to walk in hospital room?: Total Help needed climbing 3-5 steps with a railing? : Total 6 Click Score: 10    End of Session Equipment Utilized During Treatment: Gait belt Activity Tolerance: Patient tolerated treatment well Patient left: with call bell/phone within reach;in chair;with chair alarm set Nurse Communication: Mobility status;Need for lift equipment PT Visit Diagnosis: Unsteadiness on feet (R26.81);Pain Pain - Right/Left: Left Pain - part of body: Knee;Leg     Time: 1455-1530 PT Time Calculation (min) (ACUTE ONLY): 35 min  Charges:    $Therapeutic Exercise: 8-22 mins $Therapeutic Activity: 8-22 mins PT General Charges $$ ACUTE PT VISIT: 1 Visit                     Izetta Call, PT, DPT   Acute Rehabilitation Department Office [phone] Secure Chat Communication Preferred   Izetta JULIANNA Call 05/25/2023, 3:44 PM

## 2023-05-25 NOTE — TOC Progression Note (Signed)
 Transition of Care [facility]) - Progression Note    Patient Details  Name: Donna Snow MRN: [id number] Date of Birth: Jan 08, 1951  [facility] CM/SW Contact  Bridget Cordella Simmonds, LCSW Phone Number: 05/25/2023, 11:54 AM  Clinical Narrative:  Per MD, not stable for DC today.  SNF notified.      Expected Discharge Plan: Skilled Nursing Facility Barriers to Discharge: Continued Medical Work up, SNF Pending bed offer  Expected Discharge Plan and Services In-house Referral: Clinical Social Work   Post Acute Care Choice: Skilled Nursing Facility Living arrangements for the past 2 months: Single Family Home                                       Social Determinants of Health (SDOH) Interventions SDOH Screenings   Food Insecurity: No Food Insecurity (05/13/2023)  Housing: Unknown (05/13/2023)  Transportation Needs: No Transportation Needs (05/13/2023)  Utilities: Not At Risk (05/13/2023)  Depression (PHQ2-9): Medium Risk (05/03/2023)  Financial Resource Strain: High Risk (08/15/2022)  Physical Activity: Insufficiently Active (05/17/2022)   [facility], [facility]  Social Connections: Socially Isolated (05/15/2023)  Stress: Stress Concern Present (05/17/2022)   [facility], [facility], [facility], [facility]  Tobacco Use: Low Risk  (05/17/2023)    Readmission Risk Interventions     No data to display

## 2023-05-25 NOTE — Progress Notes (Addendum)
 HD#11 SUBJECTIVE:  Patient Summary: Donna Snow is a 73 y.o. female with pertinent PMH of 2 diabetes on insulin  who presented with left leg pain was admitted for cellulitis.    Overnight Events: None  Interim History: Continues to be in severe pain. Does not like asking for pain medicine. Said she couldn't sleep last night due to pain. Can move the leg more in bed.  OBJECTIVE:  Vital Signs: Vitals:   05/24/23 1344 05/24/23 2357 05/25/23 0436 05/25/23 0730  BP: (!) 130/54 99/75 (!) 121/49 (!) 130/57  Pulse: 75 64 63 69  Resp: 19 19 14 16   Temp: 97.9 F (36.6 C) 97.6 F (36.4 C) 97.9 F (36.6 C) 98.3 F (36.8 C)  TempSrc: Oral Oral Oral Oral  SpO2: 96% 95% 99% 97%  Weight:      Height:       Supplemental O2: Room Air SpO2: 97 % O2 Flow Rate (L/min): 2 L/min  Filed Weights   05/18/23 0500 05/19/23 0455 05/20/23 0500  Weight: (!) 142.8 kg (!) 144.3 kg (!) 144.3 kg     Intake/Output Summary (Last 24 hours) at 05/25/2023 1317 Last data filed at 05/25/2023 0115 Gross per 24 hour  Intake 240 ml  Output 600 ml  Net -360 ml   Net IO Since Admission: -3,285.86 mL [05/25/23 1317]  Physical Exam: Physical Exam Constitutional:      General: She is not in acute distress.    Appearance: She is obese. She is not ill-appearing. She winces frequently due to knee pain. HENT:     Nose: Nose normal.  Cardiovascular:     Rate and Rhythm: Normal rate.     Pulses: Normal pulses.  Pulmonary:     Effort: Pulmonary effort is normal.     Breath sounds: No wheezing or rales.  Abdominal:     General: Abdomen is flat. There is no distension.     Tenderness: There is no abdominal tenderness.  Musculoskeletal:        General: Swelling and tenderness present.     Right lower leg: No edema.     Left lower leg: Edema present.     Comments: L lower leg cellulitis redness resolving, circumferential redness very minimal, there remains a red unulcerated rash on the lateral L lower leg, but  also less inflamed from admission. Persistent and unchanging tenderness to palpation in L knee persists albeit requires more pressure to induce from before. Still swollen. Not as hot. Very limited ROM of L leg. No erythema other than very mild by her former surgical site. Skin:    General: Skin is warm and dry.     Findings: Rash present. Mild redness that is much improved at distal L LE per original cellulitis. Red patch on lateral leg persists, approximately three inches across. Neurological:     General: No focal deficit present.     Mental Status: She is alert.  Psychiatric:        Mood and Affect: Mood normal.        Behavior: Behavior normal.   Patient Lines/Drains/Airways Status     Active Line/Drains/Airways     Name Placement date Placement time Site Days   Peripheral IV 05/14/23 20 G 1.88 Left;Posterior Forearm 05/14/23  2333  Forearm  11   External Urinary Catheter 05/19/23  1014  --  6             ASSESSMENT/PLAN:  Assessment: Principal Problem:   Pseudogout Active Problems:  Hyperglycemia due to diabetes mellitus (HCC)   Type 2 diabetes mellitus (HCC)   AF (paroxysmal atrial fibrillation) (HCC)   Chronic anticoagulation - on coumadin  for afib   OSA on CPAP   GAD (generalized anxiety disorder)   Cellulitis of left lower extremity from knee to ankle   Sepsis without acute organ dysfunction (HCC)   Chronic constipation   Hyperglycemia   Dog scratch   Hx of penicillin allergy   Lower extremity edema  Donna Snow is a 73 y.o. female with pertinent PMH of T2 diabetes on insulin  who presented with left leg pain and is admitted for cellulitis, now left knee pain c/f pseudogout.    Plan: Pseudogout  Sepsis Rule out due to LLE cellulitis (resolved) Cellulitis of left lower extremity (resolved) Bcx positive for Kocuria Rhizophila, contaminant Pain in left knee persists from pseudogout. Severe pain despite treatment but otherwise thorough workup. Strange. She  is on her fourth day of prednisone , 50mg  every day, for this. Swelling persists without much improvement but redness and heat are certainly less. Vitals and bloodwork remain stable. Earlier workup for septic joint unrevealing and reviewed by orthopedics and ID. Feel that indolent infection would declare itself by now in labs, fever, etc. Have attempted to maximize pain medicines without IV medicines. She does not ask for her oral medicines to their max extent. Continue steroids. Add NSAIDs, w/ GI protection.  - Colchicine  0.6 BID - Continue prednisone  50qd. Monitor sugars. - Gabapentin  100 TID -> 300 TID - Ketorolac  30 IV once - Naproxen  500 BID with meals x3 days, pantoprazole  on board to protect GI - For further pain, Tylenol  1000 every 6 hours, Dilaudid  4 mg po every 3h as needed. Tizanidine  4mg  TID. Bowel regimen: Miralax  BID, Senna-docusate BID, fleet enema prn. Last BM 1/9.   T2DM Mild AG day 1 admission resolved with insulin . Most recent A1c is 7.4%. Takes Humulin R  11-20 units TID before meals, typically 300 units daily at home.   - Semglee  24 -> 28 BID on 1/7 w/ start of prednisone , SSI resistant, 6U novolog  with meal coverage. Sugars upper 100s/ low 200s.   Ambulatory dysfunction due to severe pain - Some difficulty with walking, getting to toilet. RN assisting. PT consult, recommended SNF. Bed available.   Constipation - Stable. 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  Often needs fleet enemas in order to have BM. Has bedside commode. - Senna-docusate 1 tablet oral BID, Miralax  BID, Fleet enema prn. Last BM 1/7.   Dyspnea associated with Anxiety and OSA - Stable. Intermittent dyspnea but no hypoxia. CPAP provided for sleep. Upon dyspnea, reassurance about overall plan, will monitor need for further anxiolytics, oxygen available for comfort. - Continue home Buspar  20mg  TID. Atarax  50mg     Paroxysmal Afib - Stable. Rate/Rhythm controlled without medicine.  She takes warfarin outpatient. Continuing per pharmacy.   Chronic HFpEF without exacerbation - Stable. Hold lasix  60 daily. Hold losartan  50mg  and spironolactone  12.5 mg every day in setting of low normal Bps. Check daily weights.   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  Will continue topiramate  200mg  po BID, Phenytoin  400mg  po qd. HLD - Continue home rosuvastatin  40mg  every day and zetia  10mg  every day  GERD - Continue home Protonix  40mg  qd Night Terrors - Home prazosin  1mg  am and 2mg  at bedtime Insomnia - Trazodone  200 prn at bedtime, home dose is 300mg  HTN - Holding home medicines per above  Best Practice: Diet: Regular diet IVF: none VTE: warfarin Code: Full AB: 9 Days Vanc/Cefepime  -> cefdinir  -> end 1/6 Therapy Recs: PT rec SNF DISPO: Anticipated discharge in 1-2 days to SNF pending pain control  Signature: Lonni Africa, D.O.  Internal Medicine Resident, PGY-1 [facility]  Pager: [phone] 1:17 PM, 05/25/2023   Please contact the on call pager after 5 pm and on weekends at [phone].

## 2023-05-25 NOTE — Inpatient Diabetes Management (Signed)
 Inpatient Diabetes Program Recommendations  AACE/ADA: New Consensus Statement on Inpatient Glycemic Control (2015)  Target Ranges:  Prepandial:   less than 140 mg/dL      Peak postprandial:   less than 180 mg/dL (1-2 hours)      Critically ill patients:  140 - 180 mg/dL   Lab Results  Component Value Date   GLUCAP 255 (H) 05/25/2023   HGBA1C 8.1 (H) 02/14/2022    Latest Reference Range & Units 05/24/23 06:13 05/24/23 11:16 05/24/23 15:39 05/24/23 21:27 05/25/23 07:28 05/25/23 11:12  Glucose-Capillary 70 - 99 mg/dL 831 (H) 787 (H) 694 (H) 251 (H) 237 (H) 255 (H)  (H): Data is abnormally high  Diabetes history: DM  Outpatient Diabetes medications:  U 500 insulin - 12-20 units tid with meals  Metformin 500 mg bid Ozempic ? Current orders for Inpatient glycemic control:  Novolog  0-20 units tid with meals and HS Semglee  28 units bid Prednisone  50 mg daily   Inpatient Diabetes Program Recommendations:     Please consider adding Novolog  6 units tid with meals (for meal coverage - hold if patient eats less than 50% or NPO).   Thank you, Liylah Najarro E. Tonie Vizcarrondo, RN, MSN, CDCES  Diabetes Coordinator Inpatient Glycemic Control Team Team Pager [phone] (8am-5pm) 05/25/2023 11:26 AM

## 2023-05-25 NOTE — Progress Notes (Signed)
 PHARMACY - ANTICOAGULATION CONSULT NOTE  Pharmacy Consult for warfarin Indication: atrial fibrillation  Allergies  Allergen Reactions   Latex Other (See Comments)    Welts - cant wear them, but can be touched by someone who wears them.   Vital Signs: Temp: 97.9 F (36.6 C) (01/10 0436) Temp Source: Oral (01/10 0436) BP: 121/49 (01/10 0436) Pulse Rate: 63 (01/10 0436)  Labs: Recent Labs    05/23/23 0610 05/24/23 0525 05/25/23 0603  HGB 12.3  --  12.4  HCT 38.1  --  39.8  PLT 488*  --  534*  LABPROT 25.4* 27.1* 25.5*  INR 2.3* 2.5* 2.3*  CREATININE 0.93 0.77 0.78    Estimated Creatinine Clearance: 92.1 mL/min (by C-G formula based on SCr of 0.78 mg/dL).   Medical History: Past Medical History:  Diagnosis Date   Abdominal wall abscess 10/15/2020   Abdominal wall cellulitis 10/15/2020   Anxiety    Cellulitis 06/04/2021   Cellulitis of abdominal wall 02/14/2022   CHF (congestive heart failure) (HCC)    Diabetes mellitus    Hypercholesteremia    Hypertension    Obesity    Seizures (HCC)    Stroke [facility])     Assessment: Donna Snow is a 72yoF who presented from home due to cellulitis and possible UTI. Patient is on chronic warfarin for Afib and also Dilantin  for history of seizures. Pharmacy consulted to manage warfarin while inpatient. Patient took warfarin dose 12/29. Per patient, 5mg  still caused INR to be high so dose was reduced to ~3mg . Warfarin held 1/2 >>1/4 for supratherapeutic INR.  05/24/23: INR therapeutic at 2.3. Home penytoin resumed. CBC stable. 80% of meals consumed in prior 24 hours. Given stability of INR checks for last 6 days, will initiate a maintenance daily regimen and space out both CBC and INR checks to three times weekly.  Goal of Therapy:  INR 2-3 Monitor platelets by anticoagulation protocol: Yes   Plan:  Warfarin 2.5 mg PO daily Monitor daily INR and CBC M-W-F Continue to monitor clinical course, s/sx of bleed, PO intake/diet, and  for any new DDIs  Josefa Range, PharmD PGY1 Pharmacy Resident 05/25/2023 7:35 AM

## 2023-05-26 DIAGNOSIS — E1165 Type 2 diabetes mellitus with hyperglycemia: Secondary | ICD-10-CM | POA: Diagnosis not present

## 2023-05-26 DIAGNOSIS — Z7901 Long term (current) use of anticoagulants: Secondary | ICD-10-CM | POA: Diagnosis not present

## 2023-05-26 DIAGNOSIS — M1120 Other chondrocalcinosis, unspecified site: Secondary | ICD-10-CM | POA: Diagnosis not present

## 2023-05-26 LAB — BASIC METABOLIC PANEL
Anion gap: 10 (ref 5–15)
BUN: 21 mg/dL (ref 8–23)
CO2: 28 mmol/L (ref 22–32)
Calcium: 8.5 mg/dL — ABNORMAL LOW (ref 8.9–10.3)
Chloride: 99 mmol/L (ref 98–111)
Creatinine, Ser: 0.85 mg/dL (ref 0.44–1.00)
GFR, Estimated: >60 mL/min (ref >60)
Glucose, Bld: 194 mg/dL — ABNORMAL HIGH (ref 70–99)
Potassium: 3.9 mmol/L (ref 3.5–5.1)
Sodium: 137 mmol/L (ref 135–145)

## 2023-05-26 LAB — GLUCOSE, CAPILLARY
Glucose-Capillary: 158 mg/dL — ABNORMAL HIGH (ref 70–99)
Glucose-Capillary: 163 mg/dL — ABNORMAL HIGH (ref 70–99)
Glucose-Capillary: 281 mg/dL — ABNORMAL HIGH (ref 70–99)
Glucose-Capillary: 211 mg/dL — ABNORMAL HIGH (ref 70–99)

## 2023-05-26 MED ORDER — FLEET ENEMA RE ENEM
1.0000 | ENEMA | Freq: Every day | RECTAL | Status: DC | PRN
  Administered 2023-05-26 – 2023-05-29 (×2): 1 via RECTAL
  Filled 2023-05-26 (×4): qty 1

## 2023-05-26 NOTE — Progress Notes (Signed)
   05/26/23 1538  Mobility  Activity Transferred to/from [facility]  Level of Assistance Minimal assist, patient does 75% or more  Assistive Device BSC  Range of Motion/Exercises Left arm;Right leg  Activity Response Tolerated fair  Mobility Referral Yes  Mobility visit 1 Mobility  Mobility Specialist Start Time (ACUTE ONLY) 1524  Mobility Specialist Stop Time (ACUTE ONLY) 1538  Mobility Specialist Time Calculation (min) (ACUTE ONLY) 14 min   Mobility Specialist: Progress Note  Pt agreeable to mobility session - received in bed. Required MinA using BSC. C/o BM urgency.  Returned to [facility] with all needs met - call bell within reach - Pt instructed to press call bell when finished. BLE exercises on EOB as followed: 5x leg raises, leg kicks, ankle pumps.   Virgle Boards, BS Mobility Specialist Please contact via SecureChat or Rehab office at [phone].

## 2023-05-26 NOTE — TOC Progression Note (Signed)
 Transition of Care [facility]) - Progression Note    Patient Details  Name: Donna Snow MRN: [id number] Date of Birth: [birth year]  Transition of [facility]) CM/SW Contact  Gwenn Julien Norris, [place] Phone Number: 05/26/2023, 10:14 AM  Clinical Narrative:  per [place] with [facility], they are not able to accept pt until Monday due to weather related staffing shortages. Shara is valid through 1/13. MD updated.   Julien Gwenn, MSW, LCSW [phone] (coverage)       Expected Discharge Plan: Skilled Nursing Facility Barriers to Discharge: Continued Medical Work up, SNF Pending bed offer  Expected Discharge Plan and Services In-house Referral: Clinical Social Work   Post Acute Care Choice: Skilled Nursing Facility Living arrangements for the past 2 months: Single Family Home                                       Social Determinants of Health (SDOH) Interventions SDOH Screenings   Food Insecurity: No Food Insecurity (05/13/2023)  Housing: Unknown (05/13/2023)  Transportation Needs: No Transportation Needs (05/13/2023)  Utilities: Not At Risk (05/13/2023)  Depression (PHQ2-9): Medium Risk (05/03/2023)  Financial Resource Strain: High Risk (08/15/2022)  Physical Activity: Insufficiently Active (05/17/2022)   [facility], [facility]  Social Connections: Socially Isolated (05/15/2023)  Stress: Stress Concern Present (05/17/2022)   [facility], [facility], [facility]  Tobacco Use: Low Risk  (05/17/2023)    Readmission Risk Interventions     No data to display

## 2023-05-26 NOTE — Progress Notes (Addendum)
 HD#12 SUBJECTIVE:  Patient Summary: Donna Snow is a 73 y.o. female with pertinent PMH of 2 diabetes on insulin  who presented with left leg pain was admitted for cellulitis that was treated and was treated for debilitating pseudogout pain and is not pending discharge to SNF.    Overnight Events: None  Interim History: Pain is much better this morning having added NSAIDs to her treatment regimen. She demonstrated ability to lift her L leg and flex it. She still has pain and would like to continue her current regimen but she is more relaxed and she was able to sleep last night.  OBJECTIVE:  Vital Signs: Vitals:   05/25/23 1357 05/25/23 2058 05/26/23 0721 05/26/23 0757  BP: (!) 128/45 (!) 131/44 (!) 146/68 (!) 150/61  Pulse: 61 62 61 (!) 58  Resp: 16 17 18 18   Temp: 98.7 F (37.1 C) 98 F (36.7 C) 97.6 F (36.4 C) (!) 97.5 F (36.4 C)  TempSrc: Oral Oral  Oral  SpO2: 94% 95% 95% 99%  Weight:      Height:       Supplemental O2: Room Air SpO2: 99 % O2 Flow Rate (L/min): 2 L/min  Filed Weights   05/18/23 0500 05/19/23 0455 05/20/23 0500  Weight: (!) 142.8 kg (!) 144.3 kg (!) 144.3 kg     Intake/Output Summary (Last 24 hours) at 05/26/2023 1027 Last data filed at 05/25/2023 1630 Gross per 24 hour  Intake --  Output 1 ml  Net -1 ml   Net IO Since Admission: -4,186.86 mL [05/26/23 1027]  Physical Exam: Physical Exam Constitutional:      General: She is not in acute distress.    Appearance: She is obese. She is not ill-appearing. She winces frequently due to knee pain. HENT:     Nose: Nose normal.  Cardiovascular:     Rate and Rhythm: Normal rate.     Pulses: Normal pulses.  Pulmonary:     Effort: Pulmonary effort is normal.     Breath sounds: No wheezing or rales.  Abdominal:     General: Abdomen is flat. There is no distension.     Tenderness: There is no abdominal tenderness.  Musculoskeletal:        General: Swelling and tenderness present.     Right lower  leg: No edema.     Left lower leg: Edema present.     Comments: L lower leg cellulitis redness resolving, circumferential redness very minimal, there remains a red unulcerated rash on the lateral L lower leg, but also less inflamed from admission. Greatly improved tenderness to palpation in L knee, though some tenderness persists. Swelling is slightly improved from yesterday. Not hot. ROM of L knee increased to approximately 45 degrees and pt able to lift the L leg on her own without pain. No erythema other than very mild by her former surgical site. Skin:    General: Skin is warm and dry.     Findings: Rash present. Mild redness that is much improved at distal L LE per original cellulitis. Red patch on lateral leg persists, approximately three inches across Neurological:     General: No focal deficit present.     Mental Status: She is alert.  Psychiatric:        Mood and Affect: Mood normal.        Behavior: Behavior normal.   Patient Lines/Drains/Airways Status     Active Line/Drains/Airways     Name Placement date Placement time Site Days  Peripheral IV 05/14/23 20 G 1.88 Left;Posterior Forearm 05/14/23  2333  Forearm  12   External Urinary Catheter 05/19/23  1014  --  7             ASSESSMENT/PLAN:  Assessment: Principal Problem:   Pseudogout Active Problems:   Hyperglycemia due to diabetes mellitus (HCC)   Type 2 diabetes mellitus (HCC)   AF (paroxysmal atrial fibrillation) (HCC)   Chronic anticoagulation - on coumadin  for afib   OSA on CPAP   GAD (generalized anxiety disorder)   Cellulitis of left lower extremity from knee to ankle   Sepsis without acute organ dysfunction (HCC)   Chronic constipation   Hyperglycemia   Dog scratch   Hx of penicillin allergy   Lower extremity edema  Donna Snow is a 73 y.o. female with pertinent PMH of T2 diabetes on insulin  who presented with left leg pain and is admitted for cellulitis, now resolving left knee pain c/f  pseudogout, and pending discharge to SNF.  Medical workup is complete. Cellulitis is resolved. Pseudogout pain is diminishing. We plan discharge to SNF on Monday. Would have discharged today but snow/staff shortage prevent admissions to SNF at this time.    Plan: Pseudogout  Sepsis Rule out due to LLE cellulitis (resolved) Cellulitis of left lower extremity (resolved) Bcx positive for Kocuria Rhizophila, contaminant Pain in left knee from pseudogout is improving. Yesterday NSAIDs were added to her regimen - a toradol  shot and naproxen . These were held earlier due to warfarin use. She is on her fifth day of prednisone , 50mg  every day. Swelling is improving but remains, redness and heat are certainly less. Vitals and bloodwork remain stable. Earlier workup for septic joint unrevealing and reviewed by orthopedics and ID.   - Colchicine  0.6 BID to end on 1/16 - Continue prednisone  50qd to end on 1/13. Monitor sugars. - Gabapentin  300 TID indefinitely - Naproxen  500 BID to end 1/13, pantoprazole  on board to protect GI - Tylenol  1000 every 6 hours scheduled and prn at discharage - Dilaudid  4 mg po every 3h prn to continue x 5 days after hospitalization. Bowel regimen: Miralax  BID, Senna-docusate BID, fleet enema prn. Last BM 1/10. - Tizanidine  4mg  TID to continue prn after hospitalization  T2DM Mild AG day 1 admission resolved with insulin . Most recent A1c is 7.4%. At home takes Humulin R  11-20 units TID before meals.   - Semglee  24BID -> 28BID on 1/7 w/ start of prednisone , SSI resistant, 6U novolog  with meal coverage. Sugars upper 100s/ low 200s. Upon completion of steroids on 1/13, can return to Semglee  24 BID and humalin R 11-20 units before meals.   Ambulatory dysfunction due to severe pain - Some difficulty with walking, getting to toilet. RN assisting. PT working with her, recommended SNF.   Constipation - Stable. 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  Often  needs fleet enemas in order to have BM. Has bedside commode. - Senna-docusate 1 tablet oral BID, Miralax  BID, Fleet enema prn. Last BM 1/7. Fleet enema requested today.   Dyspnea associated with Anxiety and OSA - Stable. Intermittent dyspnea but no hypoxia. CPAP provided for sleep. Upon dyspnea, reassurance about overall plan, will monitor need for further anxiolytics, oxygen available for comfort. - Continue home Buspar  20mg  TID. Atarax  50mg     Paroxysmal Afib - Stable. Rate/Rhythm controlled without medicine. She takes warfarin outpatient. Continuing per pharmacy.   Chronic HFpEF without exacerbation - Stable. Hold lasix  60 daily. Hold  losartan  50mg  and spironolactone  12.5 mg every day in setting of low normal Bps. Check daily weights.   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  Will continue topiramate  200mg  po BID, Phenytoin  400mg  po qd. HLD - Continue home rosuvastatin  40mg  every day and zetia  10mg  every day  GERD - Continue home Protonix  40mg  qd Night Terrors - Home prazosin  1mg  am and 2mg  at bedtime Insomnia - Trazodone  200 prn at bedtime, home dose is 300mg  HTN - Holding home medicines per above   Best Practice: Diet: Regular diet IVF: none VTE: warfarin Code: Full AB: 9 Days Vanc/Cefepime  -> cefdinir  -> end 1/6 Therapy Recs: PT rec SNF DISPO: Anticipated discharge Monday to SNF [facility]) pending facility readiness.  Signature: Lonni Africa, D.O.  Internal Medicine Resident, PGY-1 [facility]  Pager: [phone] 10:27 AM, 05/26/2023   Please contact the on call pager after 5 pm and on weekends at [phone].

## 2023-05-26 NOTE — Plan of Care (Signed)

## 2023-05-27 LAB — BASIC METABOLIC PANEL
Anion gap: 9 (ref 5–15)
BUN: 20 mg/dL (ref 8–23)
CO2: 29 mmol/L (ref 22–32)
Calcium: 8.9 mg/dL (ref 8.9–10.3)
Chloride: 101 mmol/L (ref 98–111)
Creatinine, Ser: 0.94 mg/dL (ref 0.44–1.00)
GFR, Estimated: >60 mL/min (ref >60)
Glucose, Bld: 163 mg/dL — ABNORMAL HIGH (ref 70–99)
Potassium: 4 mmol/L (ref 3.5–5.1)
Sodium: 139 mmol/L (ref 135–145)

## 2023-05-27 LAB — GLUCOSE, CAPILLARY
Glucose-Capillary: 201 mg/dL — ABNORMAL HIGH (ref 70–99)
Glucose-Capillary: 164 mg/dL — ABNORMAL HIGH (ref 70–99)
Glucose-Capillary: 126 mg/dL — ABNORMAL HIGH (ref 70–99)
Glucose-Capillary: 194 mg/dL — ABNORMAL HIGH (ref 70–99)

## 2023-05-27 NOTE — Progress Notes (Addendum)
   05/27/23 1059  Mobility  Activity  (Bed Level Exercises)  Level of Assistance Standby assist, set-up cues, supervision of patient - no hands on  Assistive Device Other (Comment) (Bedrails)  Range of Motion/Exercises Left leg;Right leg (x20 leg raises, x20 heel slides, x10 ankle pumps.)  Activity Response Tolerated fair  Mobility Referral Yes  Mobility visit 1 Mobility  Mobility Specialist Start Time (ACUTE ONLY) 1035  Mobility Specialist Stop Time (ACUTE ONLY) 1059  Mobility Specialist Time Calculation (min) (ACUTE ONLY) 24 min   Mobility Specialist: Progress Note  Pt agreeable to mobility session - received in bed. Required SB using bedrails, bed level exercises listed above. C/o L knee pain throughout, and a charlie horse muscle tightening in L knee area. Pt stated her muscles feel tight in BLE. Left in bed with all needs met - call bell within reach.   Virgle Boards, BS Mobility Specialist Please contact via SecureChat or Rehab office at [phone].

## 2023-05-27 NOTE — Plan of Care (Signed)
  Problem: Education: Goal: Knowledge of General Education information will improve Description: Including pain rating scale, medication(s)/side effects and non-pharmacologic comfort measures Outcome: Progressing   Problem: Clinical Measurements: Goal: Will remain free from infection Outcome: Progressing   Problem: Elimination: Goal: Will not experience complications related to bowel motility Outcome: Progressing   Problem: Nutrition: Goal: Adequate nutrition will be maintained Outcome: Progressing   Problem: Pain Management: Goal: General experience of comfort will improve Outcome: Progressing

## 2023-05-27 NOTE — Progress Notes (Addendum)
 HD#14 SUBJECTIVE:  Patient Summary: Donna Snow is a 73 y.o. female with pertinent PMH of 2 diabetes on insulin  who presented with left leg pain was admitted for cellulitis that was treated and was treated for debilitating pseudogout pain and is not pending discharge to SNF.    Overnight Events: None  Interim History: Pt evaluated bedside this morning. She says her pain has returned, however is still able to have good range of motion.   OBJECTIVE:  Vital Signs: Vitals:   05/26/23 1553 05/26/23 2139 05/27/23 0501 05/27/23 0810  BP: 123/71 (!) 99/50 (!) 125/54 (!) 122/48  Pulse: 75 76 62 60  Resp:  18 18 18   Temp: 98.5 F (36.9 C) 97.8 F (36.6 C) 98.2 F (36.8 C) 98.1 F (36.7 C)  TempSrc: Oral  Oral Oral  SpO2: 94% 97% 98% 98%  Weight:      Height:       Supplemental O2: Room Air SpO2: 98 % O2 Flow Rate (L/min): 2 L/min  Filed Weights   05/18/23 0500 05/19/23 0455 05/20/23 0500  Weight: (!) 142.8 kg (!) 144.3 kg (!) 144.3 kg     Intake/Output Summary (Last 24 hours) at 05/27/2023 1246 Last data filed at 05/26/2023 2134 Gross per 24 hour  Intake 240 ml  Output --  Net 240 ml   Net IO Since Admission: -3,946.86 mL [05/27/23 1246]  Physical Exam: Physical Exam Constitutional:      General: She is not in acute distress.    Appearance: She is obese. She is not ill-appearing. She winces frequently due to knee pain. HENT:     Nose: Nose normal.  Cardiovascular:     Rate and Rhythm: Normal rate.     Pulses: Normal pulses.  Pulmonary:     Effort: Pulmonary effort is normal.     Breath sounds: No wheezing or rales.  Abdominal:     General: Abdomen is flat. There is no distension.     Tenderness: There is no abdominal tenderness.  Musculoskeletal:        General: Swelling and tenderness present.     Right lower leg: No edema.     Left lower leg: Edema present.     Comments: L lower leg cellulitis redness resolving, circumferential redness very minimal, there  remains a red unulcerated rash on the lateral L lower leg, but also less inflamed from admission. Mild tenderness to palpation of L Knee. Able to move knee with no issues. No erythema, no warmth. Skin:    General: Skin is warm and dry.     Findings: Rash present. Mild redness that is much improved at distal L LE per original cellulitis. Red patch on lateral leg persists, approximately three inches across Neurological:     General: No focal deficit present.     Mental Status: She is alert.  Psychiatric:        Mood and Affect: Mood normal.        Behavior: Behavior normal.   Patient Lines/Drains/Airways Status     Active Line/Drains/Airways     Name Placement date Placement time Site Days   Peripheral IV 05/14/23 20 G 1.88 Left;Posterior Forearm 05/14/23  2333  Forearm  12   External Urinary Catheter 05/19/23  1014  --  7             ASSESSMENT/PLAN:  Assessment: Principal Problem:   Pseudogout Active Problems:   Hyperglycemia due to diabetes mellitus (HCC)   Type 2 diabetes mellitus (HCC)  AF (paroxysmal atrial fibrillation) (HCC)   Chronic anticoagulation - on coumadin  for afib   OSA on CPAP   GAD (generalized anxiety disorder)   Cellulitis of left lower extremity from knee to ankle   Sepsis without acute organ dysfunction (HCC)   Chronic constipation   Hyperglycemia   Dog scratch   Hx of penicillin allergy   Lower extremity edema  Donna Snow is a 73 y.o. female with pertinent PMH of T2 diabetes on insulin  who presented with left leg pain and is admitted for cellulitis, now resolving left knee pain c/f pseudogout, and pending discharge to SNF.  Medical workup is complete. Cellulitis is resolved. Pseudogout pain is diminishing. We plan discharge to SNF on Monday. Would have discharged today but snow/staff shortage prevent admissions to SNF at this time.    Plan: Pseudogout  Sepsis Rule out due to LLE cellulitis (resolved) Cellulitis of left lower extremity  (resolved) Bcx positive for Kocuria Rhizophila, contaminant Pain in left knee from pseudogout is improving, however seems like her pain is slightly worse today. I think it may be due to her moving around and using it consistently for the first time in almost two weeks. NSAIDs were added to her regimen - a toradol  shot and naproxen . These were held earlier due to warfarin use. She will finish prednisone  tomorrow. Swelling is improving but remains, redness and heat are certainly less. Vitals and bloodwork remain stable. Earlier workup for septic joint unrevealing and reviewed by orthopedics and ID.   - Colchicine  0.6 BID to end on 1/16 - Continue prednisone  50qd to end on 1/13. Monitor sugars. - Gabapentin  300 TID indefinitely - Naproxen  500 BID to end 1/13, pantoprazole  on board to protect GI - Tylenol  1000 every 6 hours scheduled and prn at discharage - Dilaudid  4 mg po every 3h prn to continue x 5 days after hospitalization. Bowel regimen: Miralax  BID, Senna-docusate BID, fleet enema prn. Last BM 1/10. - Tizanidine  4mg  TID to continue prn after hospitalization  T2DM Mild AG day 1 admission resolved with insulin . Most recent A1c is 7.4%. At home takes Humulin R  11-20 units TID before meals.   - Semglee  24BID -> 28BID on 1/7 w/ start of prednisone , SSI resistant, 6U novolog  with meal coverage. Sugars upper 100s/ low 200s. Upon completion of steroids on 1/13, can return to Semglee  24 BID and humalin R 11-20 units before meals.   Ambulatory dysfunction due to severe pain - Some difficulty with walking, getting to toilet. RN assisting. PT working with her, recommended SNF.   Constipation - Stable. 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  Often needs fleet enemas in order to have BM. Has bedside commode. - Senna-docusate 1 tablet oral BID, Miralax  BID, Fleet enema prn. Last BM 1/7. Fleet enema requested today.   Dyspnea associated with Anxiety and OSA - Stable.  Intermittent dyspnea but no hypoxia. CPAP provided for sleep. Upon dyspnea, reassurance about overall plan, will monitor need for further anxiolytics, oxygen available for comfort. - Continue home Buspar  20mg  TID. Atarax  50mg     Paroxysmal Afib - Stable. Rate/Rhythm controlled without medicine. She takes warfarin outpatient. Continuing per pharmacy.   Chronic HFpEF without exacerbation - Stable. Hold lasix  60 daily. Hold losartan  50mg  and spironolactone  12.5 mg every day in setting of low normal Bps. Check daily weights.   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  Will continue topiramate  200mg  po BID, Phenytoin  400mg  po qd. HLD -  Continue home rosuvastatin  40mg  every day and zetia  10mg  every day  GERD - Continue home Protonix  40mg  qd Night Terrors - Home prazosin  1mg  am and 2mg  at bedtime Insomnia - Trazodone  200 prn at bedtime, home dose is 300mg  HTN - Holding home medicines per above   Best Practice: Diet: Regular diet IVF: none VTE: warfarin Code: Full AB: 9 Days Vanc/Cefepime  -> cefdinir  -> end 1/6 Therapy Recs: PT rec SNF DISPO: Anticipated discharge Monday to SNF [facility]) pending facility readiness.  Signature: Rainy Rothman, MD Internal Medicine Resident, PGY-2 [facility]  Pager: [phone] 12:46 PM, 05/27/2023   Please contact the on call pager after 5 pm and on weekends at [phone].

## 2023-05-28 DIAGNOSIS — M1120 Other chondrocalcinosis, unspecified site: Secondary | ICD-10-CM | POA: Diagnosis not present

## 2023-05-28 LAB — GLUCOSE, CAPILLARY
Glucose-Capillary: 175 mg/dL — ABNORMAL HIGH (ref 70–99)
Glucose-Capillary: 204 mg/dL — ABNORMAL HIGH (ref 70–99)
Glucose-Capillary: 201 mg/dL — ABNORMAL HIGH (ref 70–99)
Glucose-Capillary: 272 mg/dL — ABNORMAL HIGH (ref 70–99)

## 2023-05-28 LAB — CBC
HCT: 39 % (ref 36.0–46.0)
Hemoglobin: 12.3 g/dL (ref 12.0–15.0)
MCH: 27.5 pg (ref 26.0–34.0)
MCHC: 31.5 g/dL (ref 30.0–36.0)
MCV: 87.1 fL (ref 80.0–100.0)
Platelets: 546 10*3/uL — ABNORMAL HIGH (ref 150–400)
RBC: 4.48 MIL/uL (ref 3.87–5.11)
RDW: 13.6 % (ref 11.5–15.5)
WBC: 5.8 10*3/uL (ref 4.0–10.5)
nRBC: 0 % (ref 0.0–0.2)

## 2023-05-28 LAB — PROTIME-INR
INR: 1.5 — ABNORMAL HIGH (ref 0.8–1.2)
INR: 1.6 — ABNORMAL HIGH (ref 0.8–1.2)
Prothrombin Time: 18.1 s — ABNORMAL HIGH (ref 11.4–15.2)
Prothrombin Time: 18.9 s — ABNORMAL HIGH (ref 11.4–15.2)

## 2023-05-28 MED ORDER — WARFARIN SODIUM 3 MG PO TABS
3.0000 mg | ORAL_TABLET | Freq: Every day | ORAL | Status: DC
  Administered 2023-05-28: 3 mg via ORAL
  Filled 2023-05-28 (×2): qty 1

## 2023-05-28 MED ORDER — PREDNISONE 20 MG PO TABS
40.0000 mg | ORAL_TABLET | Freq: Every day | ORAL | Status: DC
  Administered 2023-05-29: 40 mg via ORAL
  Filled 2023-05-28: qty 2

## 2023-05-28 MED ORDER — PREDNISONE 20 MG PO TABS
50.0000 mg | ORAL_TABLET | Freq: Once | ORAL | Status: AC
  Administered 2023-05-28: 50 mg via ORAL
  Filled 2023-05-28: qty 1

## 2023-05-28 MED ORDER — NAPROXEN 250 MG PO TABS
500.0000 mg | ORAL_TABLET | Freq: Three times a day (TID) | ORAL | Status: DC
  Administered 2023-05-28 – 2023-05-29 (×4): 500 mg via ORAL
  Filled 2023-05-28 (×4): qty 2

## 2023-05-28 NOTE — Plan of Care (Signed)
  Problem: Education: Goal: Knowledge of General Education information will improve Description: Including pain rating scale, medication(s)/side effects and non-pharmacologic comfort measures Outcome: Progressing   Problem: Activity: Goal: Risk for activity intolerance will decrease Outcome: Progressing   Problem: Pain Management: Goal: General experience of comfort will improve Outcome: Progressing

## 2023-05-28 NOTE — Plan of Care (Signed)
   Problem: Education: Goal: Knowledge of General Education information will improve Description: Including pain rating scale, medication(s)/side effects and non-pharmacologic comfort measures Outcome: Progressing   Problem: Health Behavior/Discharge Planning: Goal: Ability to manage health-related needs will improve Outcome: Progressing   Problem: Clinical Measurements: Goal: Will remain free from infection Outcome: Progressing   Problem: Nutrition: Goal: Adequate nutrition will be maintained Outcome: Progressing   Problem: Coping: Goal: Level of anxiety will decrease Outcome: Progressing   Problem: Safety: Goal: Ability to remain free from injury will improve Outcome: Progressing

## 2023-05-28 NOTE — Progress Notes (Signed)
 Mobility Specialist: Progress Note   05/28/23 1238  Mobility  Activity Stood at bedside  Level of Assistance +2 (takes two people)  Musician  Activity Response Tolerated well  Mobility Referral Yes  Mobility visit 1 Mobility  Mobility Specialist Start Time (ACUTE ONLY) 1206  Mobility Specialist Stop Time (ACUTE ONLY) 1224  Mobility Specialist Time Calculation (min) (ACUTE ONLY) 18 min    Pt was agreeable to mobility session - received in bed. C/o BLE pain. MinA+2 throughout session but modA+2 to get back to bed. Upon standing pt felt like her R knee was going to buckle, did not attempt weightshifting. Left in bed with all needs met, call bell in reach.   Ileana Lute Mobility Specialist Please contact via SecureChat or Rehab office at [phone]

## 2023-05-28 NOTE — Progress Notes (Addendum)
 PHARMACY - ANTICOAGULATION CONSULT NOTE  Pharmacy Consult for warfarin Indication: atrial fibrillation  Allergies  Allergen Reactions   Latex Other (See Comments)    Welts - cant wear them, but can be touched by someone who wears them.   Vital Signs: Temp: 97.9 F (36.6 C) (01/13 0532) Temp Source: Oral (01/12 1947) BP: 142/61 (01/13 0532) Pulse Rate: 53 (01/13 0532)  Labs: Recent Labs    05/26/23 0420 05/27/23 0446 05/28/23 0436  HGB  --   --  12.3  HCT  --   --  39.0  PLT  --   --  546*  LABPROT  --   --  18.1*  INR  --   --  1.5*  CREATININE 0.85 0.94  --     Estimated Creatinine Clearance: 78.4 mL/min (by C-G formula based on SCr of 0.94 mg/dL).   Medical History: Past Medical History:  Diagnosis Date   Abdominal wall abscess 10/15/2020   Abdominal wall cellulitis 10/15/2020   Anxiety    Cellulitis 06/04/2021   Cellulitis of abdominal wall 02/14/2022   CHF (congestive heart failure) (HCC)    Diabetes mellitus    Hypercholesteremia    Hypertension    Obesity    Seizures (HCC)    Stroke [facility])     Assessment: Donna Snow is a 72yoF who presented from home due to cellulitis and possible UTI. Patient is on chronic warfarin for Afib and also Dilantin  for history of seizures. Pharmacy consulted to manage warfarin while inpatient. Patient took warfarin dose 12/29. Per patient, 5mg  still caused INR to be high so dose was reduced to ~3mg . Warfarin held 1/2 >>1/4 for supratherapeutic INR.  05/28/23: INR subtherapeutic at 1.5 following three days of 2.5 mg daily. Home penytoin resumed. CBC stable. 100% and 40% of meals consumed in last 24 hours. 2 Glucerna supplements consumed on 1/12. CBC stable. Noted plans for discharge to SNF, hopefully tomorrow. Will resume home dose of 3 mg daily in anticipation for discharge and will check INR in the morning.  Goal of Therapy:  INR 2-3 Monitor platelets by anticoagulation protocol: Yes   Plan:  Warfarin 3 mg PO  daily Obtain INR in the morning Monitor CBC M-W-F Continue to monitor clinical course, s/sx of bleed, PO intake/diet, and for any new DDIs  Josefa Range, PharmD PGY1 Pharmacy Resident 05/28/2023 7:10 AM

## 2023-05-28 NOTE — Progress Notes (Signed)
 HD#14 SUBJECTIVE:  Patient Summary: Donna Snow is a 73 y.o. female with pertinent PMH of 2 diabetes on insulin  who presented with left leg pain was admitted for cellulitis that was treated and was treated for debilitating pseudogout pain and is not pending discharge to SNF.    Overnight Events: None  Interim History: Pt evaluated bedside this morning. She is reporting severe pain in the left knee. Pt and family will hold off discharge to SNF to tomorrow.   OBJECTIVE:  Vital Signs: Vitals:   05/27/23 1713 05/27/23 1947 05/28/23 0532 05/28/23 1423  BP: (!) 119/38 (!) 146/57 (!) 142/61 (!) 117/41  Pulse: 60 (!) 56 (!) 53 (!) 49  Resp: 18 20 19 18   Temp: 98.1 F (36.7 C) 98 F (36.7 C) 97.9 F (36.6 C) 98.4 F (36.9 C)  TempSrc: Oral Oral  Oral  SpO2: 98% 96% 91% 94%  Weight:      Height:       Supplemental O2: Room Air SpO2: 94 % O2 Flow Rate (L/min): 2 L/min  Filed Weights   05/18/23 0500 05/19/23 0455 05/20/23 0500  Weight: (!) 142.8 kg (!) 144.3 kg (!) 144.3 kg     Intake/Output Summary (Last 24 hours) at 05/28/2023 1627 Last data filed at 05/28/2023 1421 Gross per 24 hour  Intake 840 ml  Output 1750 ml  Net -910 ml   Net IO Since Admission: -4,136.86 mL [05/28/23 1627]  Physical Exam: Physical Exam Constitutional:      General: She is not in acute distress.    Appearance: She is obese. She is not ill-appearing. She winces frequently due to knee pain. HENT:     Nose: Nose normal.  Cardiovascular:     Rate and Rhythm: Normal rate.     Pulses: Normal pulses.  Pulmonary:     Effort: Pulmonary effort is normal.     Breath sounds: No wheezing or rales.  Abdominal:     General: Abdomen is flat. There is no distension.     Tenderness: There is no abdominal tenderness.  Musculoskeletal:        General: Swelling and tenderness present.     Right lower leg: No edema.     Left lower leg: Edema present.     Comments: L lower leg cellulitis redness resolving,  circumferential redness very minimal, there remains a red unulcerated rash on the lateral L lower leg, but also less inflamed from admission. Mild tenderness to palpation of L Knee. Able to move knee with no issues. No erythema, no warmth. Skin:    General: Skin is warm and dry.     Findings: Rash present. Mild redness that is much improved at distal L LE per original cellulitis. Red patch on lateral leg persists, approximately three inches across Neurological:     General: No focal deficit present.     Mental Status: She is alert.  Psychiatric:        Mood and Affect: Mood normal.        Behavior: Behavior normal.   Patient Lines/Drains/Airways Status     Active Line/Drains/Airways     Name Placement date Placement time Site Days   Peripheral IV 05/14/23 20 G 1.88 Left;Posterior Forearm 05/14/23  2333  Forearm  12   External Urinary Catheter 05/19/23  1014  --  7             ASSESSMENT/PLAN:  Assessment: Principal Problem:   Pseudogout Active Problems:   Hyperglycemia due to diabetes  mellitus (HCC)   Type 2 diabetes mellitus (HCC)   AF (paroxysmal atrial fibrillation) (HCC)   Chronic anticoagulation - on coumadin  for afib   OSA on CPAP   GAD (generalized anxiety disorder)   Cellulitis of left lower extremity from knee to ankle   Sepsis without acute organ dysfunction (HCC)   Chronic constipation   Hyperglycemia   Dog scratch   Hx of penicillin allergy   Lower extremity edema  Donna Snow is a 73 y.o. female with pertinent PMH of T2 diabetes on insulin  who presented with left leg pain and is admitted for cellulitis, now resolving left knee pain c/f pseudogout, and pending discharge to SNF.  Medical workup is complete. Cellulitis is resolved. Pseudogout pain is diminishing. We plan discharge to SNF on Monday. Would have discharged today but snow/staff shortage prevent admissions to SNF at this time.    Plan: Pseudogout  Sepsis Rule out due to LLE cellulitis  (resolved) Cellulitis of left lower extremity (resolved) She is reporting severe pain 7/10 in the left knee.  She has received treatment with colchicine , naproxen  and steroid, last dose of steroid was on 1/10.  Pseudo gout rebound started on Saturday a day  after she completed the steroid.  She had a fall as she tried to get out of bed.  On exam, left knee tender with mild swelling and redness.  In the setting of pseudogout flare, will restart patient on oral steroid dose, and plan for a slower taper.  Will also continue patient on colchicine  and naproxen  for 1 to 2 weeks after he leaves the hospital.  Patient was due for discharge to SNF, but it was postponed in the setting of the pseudogout flare.  Will reassess tomorrow and if patient is feeling well after the oral steroid dose, will be ready for discharge.  - Colchicine  0.6 BID  - Prednisone  40 mg. - Gabapentin  300 TID indefinitely - Continue naproxen  500 twice daily, pantoprazole  on board to protect GI - Tylenol  1000 every 6 hours scheduled and prn at discharage - Dilaudid  4 mg po every 3h prn to continue x 5 days after hospitalization. Bowel regimen: Miralax  BID, Senna-docusate BID, fleet enema prn. Last BM 1/10. - Tizanidine  4mg  TID to continue prn after hospitalization  T2DM Blood glucose around 200-210. Most recent A1c is 7.4%.   -Continue Semglee  28BID and SSI resistant, 6U novolog  with meal coverage.   Constipation  Endorses 1 bowel movement since admitted.    - Senna-docusate 1 tablet oral BID, Miralax  BID, Fleet enema prn. Last BM 1/7. Fleet enema requested today.   Dyspnea associated with Anxiety and OSA - Stable. Intermittent dyspnea but no hypoxia. CPAP provided for sleep. Upon dyspnea, reassurance about overall plan, will monitor need for further anxiolytics, oxygen available for comfort. - Continue home Buspar  20mg  TID. Atarax  50mg     Paroxysmal Afib - Stable. Rate/Rhythm controlled without medicine. She takes warfarin  outpatient. Continuing per pharmacy.   Chronic HFpEF without exacerbation - Stable. Hold lasix  60 daily. Hold losartan  50mg  and spironolactone  12.5 mg every day in setting of low normal Bps. Check daily weights.   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  Will continue topiramate  200mg  po BID, Phenytoin  400mg  po qd. HLD - Continue home rosuvastatin  40mg  every day and zetia  10mg  every day  GERD - Continue home Protonix  40mg  qd Night Terrors - Home prazosin  1mg  am and 2mg  at bedtime Insomnia - Trazodone  200 prn at bedtime, home dose  is 300mg  HTN - Holding home medicines per above   Best Practice: Diet: Regular diet IVF: none VTE: warfarin Code: Full AB: 9 Days Vanc/Cefepime  -> cefdinir  -> end 1/6 Therapy Recs: PT rec SNF DISPO: Anticipated discharge Tuesday to SNF [facility]) pending facility readiness.  Signature: Jaquisha Frech, M.D.  Internal Medicine Resident, PGY-1 [facility]  Pager: [phone] 4:39 PM, 05/28/2023   **Please contact the on call pager after 5 pm and on weekends at [phone].**

## 2023-05-29 DIAGNOSIS — L03116 Cellulitis of left lower limb: Secondary | ICD-10-CM | POA: Diagnosis not present

## 2023-05-29 DIAGNOSIS — M1120 Other chondrocalcinosis, unspecified site: Secondary | ICD-10-CM | POA: Diagnosis not present

## 2023-05-29 DIAGNOSIS — E1165 Type 2 diabetes mellitus with hyperglycemia: Secondary | ICD-10-CM | POA: Diagnosis not present

## 2023-05-29 DIAGNOSIS — Z794 Long term (current) use of insulin: Secondary | ICD-10-CM | POA: Diagnosis not present

## 2023-05-29 LAB — BASIC METABOLIC PANEL
Anion gap: 7 (ref 5–15)
BUN: 24 mg/dL — ABNORMAL HIGH (ref 8–23)
CO2: 28 mmol/L (ref 22–32)
Calcium: 8.8 mg/dL — ABNORMAL LOW (ref 8.9–10.3)
Chloride: 102 mmol/L (ref 98–111)
Creatinine, Ser: 1.04 mg/dL — ABNORMAL HIGH (ref 0.44–1.00)
GFR, Estimated: 57 mL/min — ABNORMAL LOW (ref >60)
Glucose, Bld: 268 mg/dL — ABNORMAL HIGH (ref 70–99)
Potassium: 4.5 mmol/L (ref 3.5–5.1)
Sodium: 137 mmol/L (ref 135–145)

## 2023-05-29 LAB — PROTIME-INR
INR: 1.6 — ABNORMAL HIGH (ref 0.8–1.2)
Prothrombin Time: 19.3 s — ABNORMAL HIGH (ref 11.4–15.2)

## 2023-05-29 LAB — GLUCOSE, CAPILLARY
Glucose-Capillary: 253 mg/dL — ABNORMAL HIGH (ref 70–99)
Glucose-Capillary: 224 mg/dL — ABNORMAL HIGH (ref 70–99)
Glucose-Capillary: 256 mg/dL — ABNORMAL HIGH (ref 70–99)

## 2023-05-29 MED ORDER — COLCHICINE 0.6 MG PO TABS: 0.6000 mg | ORAL_TABLET | Freq: Two times a day (BID) | ORAL | Status: AC

## 2023-05-29 MED ORDER — PREDNISONE 20 MG PO TABS: 30.0000 mg | ORAL_TABLET | Freq: Every day | ORAL | Status: DC

## 2023-05-29 MED ORDER — INSULIN ASPART 100 UNIT/ML IJ SOLN
8.0000 [IU] | Freq: Three times a day (TID) | INTRAMUSCULAR | Status: DC
Start: 2023-05-29 — End: 2023-05-29
  Administered 2023-05-29: 8 [IU] via SUBCUTANEOUS

## 2023-05-29 MED ORDER — INSULIN GLARGINE-YFGN 100 UNIT/ML ~~LOC~~ SOLN
32.0000 [IU] | Freq: Two times a day (BID) | SUBCUTANEOUS | Status: DC
  Filled 2023-05-29: qty 0.32

## 2023-05-29 MED ORDER — PREDNISONE 10 MG PO TABS: 10.0000 mg | ORAL_TABLET | Freq: Every day | ORAL | Status: DC

## 2023-05-29 MED ORDER — INSULIN ASPART 100 UNIT/ML IJ SOLN: 0.0000 [IU] | Freq: Three times a day (TID) | INTRAMUSCULAR | Status: AC

## 2023-05-29 MED ORDER — INSULIN GLARGINE-YFGN 100 UNIT/ML ~~LOC~~ SOLN: 32.0000 [IU] | Freq: Two times a day (BID) | SUBCUTANEOUS | Status: AC

## 2023-05-29 MED ORDER — INSULIN ASPART 100 UNIT/ML IJ SOLN: 8.0000 [IU] | Freq: Three times a day (TID) | INTRAMUSCULAR | Status: DC

## 2023-05-29 MED ORDER — PREDNISONE 20 MG PO TABS: 20.0000 mg | ORAL_TABLET | Freq: Every day | ORAL | Status: DC

## 2023-05-29 MED ORDER — HYDROMORPHONE HCL 4 MG PO TABS: 4.0000 mg | ORAL_TABLET | ORAL | 0 refills | Status: AC | PRN

## 2023-05-29 MED ORDER — NAPROXEN 500 MG PO TABS: 500.0000 mg | ORAL_TABLET | Freq: Three times a day (TID) | ORAL | Status: AC

## 2023-05-29 MED ORDER — FLUOXETINE HCL 20 MG PO CAPS: 60.0000 mg | ORAL_CAPSULE | Freq: Every day | ORAL | Status: DC

## 2023-05-29 MED ORDER — DICLOFENAC SODIUM 1 % EX GEL: 4.0000 g | Freq: Four times a day (QID) | CUTANEOUS | Status: AC

## 2023-05-29 MED ORDER — ACETAMINOPHEN 500 MG PO TABS: 1000.0000 mg | ORAL_TABLET | Freq: Four times a day (QID) | ORAL | Status: AC

## 2023-05-29 MED ORDER — LIDOCAINE 5 % EX PTCH: 1.0000 | MEDICATED_PATCH | Freq: Two times a day (BID) | CUTANEOUS | Status: AC

## 2023-05-29 MED ORDER — PREDNISONE 10 MG PO TABS: ORAL_TABLET | ORAL | Status: AC

## 2023-05-29 MED ORDER — WARFARIN SODIUM 3 MG PO TABS: 3.0000 mg | ORAL_TABLET | Freq: Every day | ORAL | Status: DC

## 2023-05-29 MED ORDER — PREDNISONE 20 MG PO TABS: 40.0000 mg | ORAL_TABLET | Freq: Every day | ORAL | Status: DC

## 2023-05-29 NOTE — Progress Notes (Signed)
 Nutrition Follow-up  DOCUMENTATION CODES:   Morbid obesity  INTERVENTION:   -Continue Carb modified/heart healthy diet.  -Recommend continue Glucerna Shake po, decrease to BID, each supplement provides 220 kcal and 10 grams of protein  -Diet education provided.    NUTRITION DIAGNOSIS:   Inadequate oral intake related to poor appetite, acute illness, other (see comment) (reported pain) as evidenced by per patient/family report, energy intake < or equal to 50% for > or equal to 5 days.  -ongoing, addressing with oral supplements  GOAL:   Patient will meet greater than or equal to 90% of their needs  -progressing  MONITOR:   PO intake, Weight trends, Supplement acceptance, Skin, Labs  REASON FOR ASSESSMENT:   Follow up  ASSESSMENT:   73 y/o female presented to hospital due to concerns of left leg pain and swelling. Cellulitis of left lower extremity, from knee to ankle secondary to unintentional dog scratch about one week prior to admission. Pain in left knee concerning for pseudogout.  PMH: DM2, chronic back pain, atrial flutter, CHF, HLD, HTN, seizures, CVA, depression, COPD, abdominal surgery, cholecystectomy.  Discharge plan to SNF today.  Intakes recorded average 79% x 8 meals. She is taking the Glucerna however notes concern she will gain weight, reassured at this time she needs the nutrition as she is trying gain strength with therapies. Patient is asking for diet education for current meal plan and nutrition recommendations for gout. Provided brief diet education general for Carb controlled/heart healthy which was discussed as well as written education materials for low purine diet with gout.  Current weight up 2# from initial which likely represents fluid, monitor.  Medications reviewed and include novolog  8 units 3x daily with meals & SS 3x daily with meals and at bedtime, insulin  glargine-32 units 2x daily, MVI/Minerals-1 Tab daily, PPI, Miralax , prednisone ,  Senokot-S, warfarin.  Labs: CBG 201-272 past 24 hrs   Diet Order:   Diet Order             Diet - low sodium heart healthy           Diet heart healthy/carb modified Room service appropriate? Yes; Fluid consistency: Thin  Diet effective now                   EDUCATION NEEDS:      Skin:  Skin Assessment: Skin Integrity Issues: Skin Integrity Issues:: Other (Comment) Other: erythema/redness to abdomen, leg, sacrum  Last BM:  05/29/2023, type 4-medium  Height:   Ht Readings from Last 1 Encounters:  05/15/23 5' 4 (1.626 m)    Weight:   Wt Readings from Last 1 Encounters:  05/20/23 (!) 144.3 kg    Ideal Body Weight:  56.8 kg  BMI:  Body mass index is 54.61 kg/m.  Estimated Nutritional Needs:   Kcal:  1750-2000 kcal/day  Protein:  74-85 gm/day  Fluid:  1750-2000 mL/day    Elveria Sable, RDLD Clinical Dietitian If unable to reach, please contact RD Inpatient secure chat group between 8 am-4 pm daily

## 2023-05-29 NOTE — Progress Notes (Signed)
 Mobility Specialist: Progress Note   05/29/23 1617  Mobility  Activity Ambulated with assistance in room;Transferred from bed to chair  Level of Assistance +2 (takes two people)  Press Photographer wheel walker  Distance Ambulated (ft) 3 ft  Activity Response Tolerated well  Mobility Referral Yes  Mobility visit 1 Mobility  Mobility Specialist Start Time (ACUTE ONLY) 1340  Mobility Specialist Stop Time (ACUTE ONLY) 1356  Mobility Specialist Time Calculation (min) (ACUTE ONLY) 16 min    Pt was agreeable to mobility session - received in bed. MinA+2 for STS, minG to light minA for ambulation to chair. Left in chair with all needs met, call bell in reach.   Ileana Lute Mobility Specialist Please contact via SecureChat or Rehab office at [phone]

## 2023-05-29 NOTE — Progress Notes (Signed)
 Physical Therapy Treatment Patient Details Name: Donna Snow MRN: [id number] DOB: January 23, 1951 Today's Date: 05/29/2023   History of Present Illness Pt is a 73 y/o female who presents 05/13/2023 with LLE cellulitis. Pt being treated for L knee pseudogout. PMH significant for Abdominal wall abscess and cellulitis, anxiety, CHF, DM, HTN, obesity, seizures, stroke, TKA.    PT Comments  Pt received in supine, agreeable to therapy session but requesting transfer to bed pan first. PTA offered to assist with transfer from bed to [facility], however pt reports urinary/bowel urgency too strong, so instead rolling to L side for placement/removal of bed pan. Pt needing totalA for hygiene assist after toileting. Transport team arriving at end of session, RN notified. minA for transfer from supine to long sitting in bed from elevated HOB posture. Pt continues to benefit from PT services to progress toward functional mobility goals.     If plan is discharge home, recommend the following: Two people to help with walking and/or transfers;A lot of help with bathing/dressing/bathroom;Assistance with cooking/housework;Assist for transportation;Help with stairs or ramp for entrance   Can travel by private vehicle     No  Equipment Recommendations  BSC/3in1    Recommendations for Other Services       Precautions / Restrictions Precautions Precautions: Fall Precaution Comments: air bed Restrictions Weight Bearing Restrictions Per Provider Order: No     Mobility  Bed Mobility Overal bed mobility: Needs Assistance Bed Mobility: Rolling, Supine to Sit Rolling: Contact guard assist   Supine to sit: Used rails, Min assist, HOB elevated     General bed mobility comments: rolling to L EOB x2 reps for placement/removal of bed pan, minA for supine to long sitting from elevated HOB posture; heavy reliance on bed rail for trunk lifting; pt defers EOB/OOB for toileting due to urinary urgency. Pt had sensation of needing  BM but only urinated.    Transfers Overall transfer level: Needs assistance                 General transfer comment: UTA, transport team arriving to her room for DC    Ambulation/Gait                   Stairs             Wheelchair Mobility     Tilt Bed    Modified Rankin (Stroke Patients Only)       Balance Overall balance assessment: Needs assistance Sitting-balance support: Feet supported, No upper extremity supported Sitting balance-Leahy Scale: Fair         Standing balance comment: defer, transport arriving                            Cognition Arousal: Alert Behavior During Therapy: Anxious Overall Cognitive Status: Within Functional Limits for tasks assessed                                 General Comments: pt appreciative of assistance from PTA, session limited due to arrival of PTAR for transport        Exercises      General Comments General comments (skin integrity, edema, etc.): negative BM after using bed pan      Pertinent Vitals/Pain Pain Assessment Pain Assessment: Faces Faces Pain Scale: Hurts a little bit Pain Location: generalized with bed mobility Pain Descriptors / Indicators: Guarding, Discomfort Pain Intervention(s):  Monitored during session, Limited activity within patient's tolerance, Repositioned    Home Living                          Prior Function            PT Goals (current goals can now be found in the care plan section) Acute Rehab PT Goals Patient Stated Goal: Decrease pain, go home PT Goal Formulation: With patient Time For Goal Achievement: 05/31/23 Progress towards PT goals: Progressing toward goals    Frequency    Min 1X/week      PT Plan      Co-evaluation              AM-PAC PT 6 Clicks Mobility   Outcome Measure  Help needed turning from your back to your side while in a flat bed without using bedrails?: A Little Help  needed moving from lying on your back to sitting on the side of a flat bed without using bedrails?: A Lot Help needed moving to and from a bed to a chair (including a wheelchair)?: A Lot Help needed standing up from a chair using your arms (e.g., wheelchair or bedside chair)?: A Lot Help needed to walk in hospital room?: Total Help needed climbing 3-5 steps with a railing? : Total 6 Click Score: 11    End of Session   Activity Tolerance: Other (comment) (urinary urgency and transport team arrival) Patient left: with call bell/phone within reach;in bed;with nursing/sitter in room Nurse Communication: Mobility status;Other (comment) (urinated, no BM) PT Visit Diagnosis: Unsteadiness on feet (R26.81);Pain Pain - Right/Left: Left Pain - part of body: Knee;Leg     Time: 8373-8354 PT Time Calculation (min) (ACUTE ONLY): 19 min  Charges:    $Therapeutic Activity: 8-22 mins PT General Charges $$ ACUTE PT VISIT: 1 Visit                     Farren Nelles P., PTA Acute Rehabilitation Services Secure Chat Preferred 9a-5:30pm Office: [phone]    Connell HERO [facility] 05/29/2023, 4:59 PM

## 2023-05-29 NOTE — TOC Transition Note (Signed)
 Transition of Care [facility]) - Discharge Note   Patient Details  Name: Donna Snow MRN: [id number] Date of Birth: 1950-07-19  Transition of Care [facility]) CM/SW Contact:  Bridget Cordella Simmonds, LCSW Phone Number: 05/29/2023, 2:05 PM   Clinical Narrative:   Pt discharging to [facility].  RN call report to [phone].      Final next level of care: Skilled Nursing Facility Barriers to Discharge: Barriers Resolved   Patient Goals and CMS Choice Patient states their goals for this hospitalization and ongoing recovery are:: walk CMS Medicare.gov Compare Post Acute Care list provided to:: Patient Choice offered to / list presented to : Patient      Discharge Placement              Patient chooses bed at: [facility] Patient to be transferred to facility by: ptar Name of family member notified: daughter [name] Patient and family notified of of transfer: 05/29/23  Discharge Plan and Services Additional resources added to the After Visit Summary for   In-house Referral: Clinical Social Work   Post Acute Care Choice: Skilled Nursing Facility                               Social Drivers of Health (SDOH) Interventions SDOH Screenings   Food Insecurity: No Food Insecurity (05/13/2023)  Housing: Unknown (05/13/2023)  Transportation Needs: No Transportation Needs (05/13/2023)  Utilities: Not At Risk (05/13/2023)  Depression (PHQ2-9): Medium Risk (05/03/2023)  Financial Resource Strain: High Risk (08/15/2022)  Physical Activity: Insufficiently Active (05/17/2022)   [facility], [facility]  Social Connections: Socially Isolated (05/15/2023)  Stress: Stress Concern Present (05/17/2022)   [facility], [facility], [facility], [facility]  Tobacco Use: Low Risk  (05/17/2023)     Readmission Risk Interventions     No data to display

## 2023-05-29 NOTE — Progress Notes (Signed)
 PHARMACY - ANTICOAGULATION CONSULT NOTE  Pharmacy Consult for warfarin Indication: atrial fibrillation  Allergies  Allergen Reactions   Latex Other (See Comments)    Welts - cant wear them, but can be touched by someone who wears them.   Vital Signs: Temp: 97.5 F (36.4 C) (01/14 0440) Temp Source: Oral (01/14 0440) BP: 128/68 (01/14 0440) Pulse Rate: 53 (01/14 0440)  Labs: Recent Labs    05/27/23 0446 05/28/23 0436 05/28/23 0831 05/29/23 0556  HGB  --  12.3  --   --   HCT  --  39.0  --   --   PLT  --  546*  --   --   LABPROT  --  18.1* 18.9* 19.3*  INR  --  1.5* 1.6* 1.6*  CREATININE 0.94  --   --  1.04*    Estimated Creatinine Clearance: 70.9 mL/min (A) (by C-G formula based on SCr of 1.04 mg/dL (H)).   Medical History: Past Medical History:  Diagnosis Date   Abdominal wall abscess 10/15/2020   Abdominal wall cellulitis 10/15/2020   Anxiety    Cellulitis 06/04/2021   Cellulitis of abdominal wall 02/14/2022   CHF (congestive heart failure) (HCC)    Diabetes mellitus    Hypercholesteremia    Hypertension    Obesity    Seizures (HCC)    Stroke [facility])     Assessment: Donna Snow is a 72yoF who presented from home due to cellulitis and possible UTI. Patient is on chronic warfarin for Afib and also Dilantin  for history of seizures. Pharmacy consulted to manage warfarin while inpatient. Patient took warfarin dose 12/29. Per patient, 5mg  still caused INR to be high so dose was reduced to ~3mg . Warfarin held 1/2 >>1/4 for supratherapeutic INR.  05/29/23: INR subtherapeutic at 1.6. Home penytoin resumed. CBC stable from yesterday. 100% of meals consumed in last 24 hours. 2 Glucerna supplements consumed on 1/13. Noted plans for discharge to SNF, hopefully today. Will continue home dose of 3 mg daily in anticipation for discharge.  Goal of Therapy:  INR 2-3 Monitor platelets by anticoagulation protocol: Yes   Plan:  Warfarin 3 mg PO daily Monitor INR and CBC  M-W-F Continue to monitor clinical course, s/sx of bleed, PO intake/diet, and for any new DDIs  Josefa Range, PharmD PGY1 Pharmacy Resident 05/29/2023 6:57 AM

## 2023-05-29 NOTE — Discharge Instructions (Addendum)
 FOLLOW-UP INSTRUCTIONS:  Thank you for allowing us  to be part of your care. You were hospitalized for cellulitis of left leg.  You are also found to have pseudogout of the left knee.  Please follow up with the following providers: A.  PCP at [facility], [place], [address] / [place] [address], [phone], 1-2 weeks after leaving the hospital.   Please note these changes made to your medications:   A. Medications to continue: Current Meds  Medication Sig   albuterol  (PROVENTIL ) (2.5 MG/3ML) 0.083% nebulizer solution Inhale 3 mLs (2.5 mg total) into the lungs every 4 (four) hours as needed for shortness of breath.   albuterol  (VENTOLIN  HFA) 108 (90 Base) MCG/ACT inhaler Inhale 2 puffs into the lungs every 4 (four) hours as needed for wheezing or shortness of breath.   bisacodyl  (FLEET) 10 MG/30ML ENEM Place 10 mg rectally daily.   busPIRone  (BUSPAR ) 10 MG tablet Take 20 mg by mouth 3 (three) times daily.   donepezil (ARICEPT) 5 MG tablet Take 5 mg by mouth daily.   ezetimibe  (ZETIA ) 10 MG tablet Take 10 mg by mouth daily.   furosemide  (LASIX ) 20 MG tablet Take 3 tablets (60 mg total) by mouth daily.   hydrOXYzine  (ATARAX ) 50 MG tablet Take 50 mg by mouth 2 (two) times daily.   hyoscyamine  (LEVSIN  SL) 0.125 MG SL tablet Take 0.125 mg by mouth daily as needed for cramping.   ibuprofen  (ADVIL ) 800 MG tablet Take 800 mg by mouth every 8 (eight) hours as needed for mild pain or moderate pain.   insulin  regular human CONCENTRATED (HUMULIN R ) 500 UNIT/ML injection Inject 12-20 Units into the skin 3 (three) times daily before meals. Sliding scale 3 times daily   loratadine  (CLARITIN ) 10 MG tablet Take 10 mg by mouth daily.   losartan  (COZAAR ) 50 MG tablet Take 50 mg by mouth daily.   metFORMIN (GLUCOPHAGE) 500 MG tablet Take 500 mg by mouth 2 (two) times daily with a meal.   omeprazole (PRILOSEC) 40 MG capsule Take 40 mg by mouth daily.   OZEMPIC , 0.25 OR 0.5  MG/DOSE, 2 MG/3ML SOPN Inject 0.5 mg into the skin once a week. Wednesday   phenytoin  (DILANTIN ) 100 MG ER capsule Take 400 mg by mouth daily.   polyethylene glycol powder (GLYCOLAX /MIRALAX ) 17 GM/SCOOP powder Take 1 Container by mouth once. Takes 1 scoop per day   prazosin  (MINIPRESS ) 1 MG capsule Take 1 mg by mouth in the morning. Take 1mg  (1 tab) in AM. Take 2mg  (2 tab) at bedtime (per patient statement).   rosuvastatin  (CRESTOR ) 40 MG tablet Take 1 tablet by mouth daily.   spironolactone  (ALDACTONE ) 25 MG tablet Take 0.5 tablets (12.5 mg total) by mouth daily.   topiramate  (TOPAMAX ) 100 MG tablet Take 200 mg by mouth 2 (two) times daily.   trazodone  (DESYREL ) 300 MG tablet Take 300 mg by mouth at bedtime as needed for sleep.   warfarin (COUMADIN ) 5 MG tablet Take 3 mg by mouth daily.      B.  New medicines to start - Prednisone  40 mg for 2 more days--->prednisone  20 mg for 3 days---->prednisone  10 mg for 3 days. - Naproxen  500 mg 3 times daily for 2 weeks. - Colchicine  0.6 mg twice daily for 2 weeks. - Insulin  regimen.  C. Medications to discontinue: None.  Please make sure to return to the hospital if you have severe knee pain, new fevers or shortness of breath.  Please  call our clinic if you have any questions or concerns, we may be able to help and keep you from a long and expensive emergency room wait. Our clinic and after hours phone number is [phone], the best time to call is Monday through Friday 9 am to 4 pm but there is always someone available 24/7 if you have an emergency. If you need medication refills please notify your pharmacy one week in advance and they will send us  a request.

## 2023-05-29 NOTE — TOC Progression Note (Addendum)
 Transition of Care [facility]) - Progression Note    Patient Details  Name: Donna Snow MRN: [id number] Date of Birth: 05-09-1951  Transition of [facility]) CM/SW Contact  Bridget Cordella Simmonds, LCSW Phone Number: 05/29/2023, 9:59 AM  Clinical Narrative:  SNF shara has been updated by Mayme: [id number], [id number], 7 days: 1/9-1/15.     CSW confirmed with [facility] that they can receive pt today.    Expected Discharge Plan: Skilled Nursing Facility Barriers to Discharge: Continued Medical Work up, SNF Pending bed offer  Expected Discharge Plan and Services In-house Referral: Clinical Social Work   Post Acute Care Choice: Skilled Nursing Facility Living arrangements for the past 2 months: Single Family Home                                       Social Determinants of Health (SDOH) Interventions SDOH Screenings   Food Insecurity: No Food Insecurity (05/13/2023)  Housing: Unknown (05/13/2023)  Transportation Needs: No Transportation Needs (05/13/2023)  Utilities: Not At Risk (05/13/2023)  Depression (PHQ2-9): Medium Risk (05/03/2023)  Financial Resource Strain: High Risk (08/15/2022)  Physical Activity: Insufficiently Active (05/17/2022)   [facility], [facility]  Social Connections: Socially Isolated (05/15/2023)  Stress: Stress Concern Present (05/17/2022)   [facility], [facility], [facility], [facility]  Tobacco Use: Low Risk  (05/17/2023)    Readmission Risk Interventions     No data to display

## 2023-05-29 NOTE — Discharge Summary (Signed)
 Name: Donna Snow MRN: [id number] DOB: 03/12/1951 73 y.o. PCP: [facility]  Date of Admission: 05/13/2023  9:05 AM Date of Discharge: 05/29/2023 Attending Physician: Dr. Lovie  Discharge Diagnosis: Principal Problem:   Pseudogout Active Problems:   Hyperglycemia due to diabetes mellitus (HCC)   Type 2 diabetes mellitus (HCC)   AF (paroxysmal atrial fibrillation) (HCC)   Chronic anticoagulation - on coumadin  for afib   OSA on CPAP   GAD (generalized anxiety disorder)   Cellulitis of left lower extremity from knee to ankle   Sepsis without acute organ dysfunction (HCC)   Chronic constipation   Hyperglycemia   Dog scratch   Hx of penicillin allergy   Lower extremity edema    Discharge Medications: Allergies as of 05/29/2023       Reactions   Latex Other (See Comments)   Welts - cant wear them, but can be touched by someone who wears them.        Medication List     PAUSE taking these medications    losartan  50 MG tablet Wait to take this until your doctor or other care provider tells you to start again. Commonly known as: COZAAR  Take 50 mg by mouth daily.   spironolactone  25 MG tablet Wait to take this until your doctor or other care provider tells you to start again. Commonly known as: ALDACTONE  Take 0.5 tablets (12.5 mg total) by mouth daily.       STOP taking these medications    ibuprofen  800 MG tablet Commonly known as: ADVIL    insulin  regular human CONCENTRATED 500 UNIT/ML injection Commonly known as: HUMULIN R        TAKE these medications    acetaminophen  500 MG tablet Commonly known as: TYLENOL  Take 2 tablets (1,000 mg total) by mouth every 6 (six) hours.   albuterol  (2.5 MG/3ML) 0.083% nebulizer solution Commonly known as: PROVENTIL  Inhale 3 mLs (2.5 mg total) into the lungs every 4 (four) hours as needed for shortness of breath.   albuterol  108 (90 Base) MCG/ACT inhaler Commonly known as: VENTOLIN  HFA Inhale 2  puffs into the lungs every 4 (four) hours as needed for wheezing or shortness of breath.   bisacodyl  10 MG/30ML Enem Commonly known as: FLEET Place 10 mg rectally daily.   busPIRone  10 MG tablet Commonly known as: BUSPAR  Take 20 mg by mouth 3 (three) times daily.   colchicine  0.6 MG tablet Take 1 tablet (0.6 mg total) by mouth 2 (two) times daily for 14 days.   diclofenac  Sodium 1 % Gel Commonly known as: VOLTAREN  Apply 4 g topically 4 (four) times daily.   donepezil 5 MG tablet Commonly known as: ARICEPT Take 5 mg by mouth daily.   ezetimibe  10 MG tablet Commonly known as: ZETIA  Take 10 mg by mouth daily.   FLUoxetine  20 MG capsule Commonly known as: PROZAC  Take 20 mg by mouth daily. With 40mg  for total of 60mg  What changed: Another medication with the same name was added. Make sure you understand how and when to take each.   FLUoxetine  40 MG capsule Commonly known as: PROZAC  Take 40 mg by mouth daily. With 20mg  for total of 60mg  What changed: Another medication with the same name was added. Make sure you understand how and when to take each.   FLUoxetine  20 MG capsule Commonly known as: PROZAC  Take 3 capsules (60 mg total) by mouth daily. What changed: You were already taking a medication with the same name, and this  prescription was added. Make sure you understand how and when to take each.   fluticasone  furoate-vilanterol 100-25 MCG/INH Aepb Commonly known as: BREO ELLIPTA  Inhale 1 puff into the lungs daily as needed (for shortness of breath).   furosemide  20 MG tablet Commonly known as: LASIX  Take 3 tablets (60 mg total) by mouth daily.   HYDROmorphone  4 MG tablet Commonly known as: DILAUDID  Take 1 tablet (4 mg total) by mouth every 3 (three) hours as needed for up to 5 days for severe pain (pain score 7-10).   hydrOXYzine  50 MG tablet Commonly known as: ATARAX  Take 50 mg by mouth 2 (two) times daily.   hyoscyamine  0.125 MG SL tablet Commonly known as:  LEVSIN  SL Take 0.125 mg by mouth daily as needed for cramping.   insulin  aspart 100 UNIT/ML injection Commonly known as: novoLOG  Inject 8 Units into the skin 3 (three) times daily with meals.   insulin  aspart 100 UNIT/ML injection Commonly known as: novoLOG  Inject 0-20 Units into the skin 3 (three) times daily with meals. This is a sliding scale in addition to scheduled Novolog  dose.  CBG < 70: Implement Hypoglycemia Standing Orders and refer to Hypoglycemia Standing Orders sidebar report CBG 70 - 120: 0 units CBG 121 - 150: 3 units CBG 151 - 200: 4 units CBG 201 - 250: 7 units CBG 251 - 300: 11 units CBG 301 - 350: 15 units CBG 351 - 400: 20 units   insulin  glargine-yfgn 100 UNIT/ML injection Commonly known as: SEMGLEE  Inject 0.32 mLs (32 Units total) into the skin 2 (two) times daily.   lidocaine  5 % Commonly known as: LIDODERM  Place 1 patch onto the skin 2 (two) times daily. Remove & Discard patch within 12 hours or as directed by MD   loratadine  10 MG tablet Commonly known as: CLARITIN  Take 10 mg by mouth daily.   metFORMIN 500 MG tablet Commonly known as: GLUCOPHAGE Take 500 mg by mouth 2 (two) times daily with a meal.   naproxen  500 MG tablet Commonly known as: NAPROSYN  Take 1 tablet (500 mg total) by mouth 3 (three) times daily with meals for 14 days.   omeprazole 40 MG capsule Commonly known as: PRILOSEC Take 40 mg by mouth daily.   Ozempic  (0.25 or 0.5 MG/DOSE) 2 MG/3ML Sopn Generic drug: Semaglutide (0.25 or 0.5MG /DOS) Inject 0.5 mg into the skin once a week. Wednesday   phenytoin  100 MG ER capsule Commonly known as: DILANTIN  Take 400 mg by mouth daily.   polyethylene glycol powder 17 GM/SCOOP powder Commonly known as: GLYCOLAX /MIRALAX  Take 1 Container by mouth once. Takes 1 scoop per day   prazosin  1 MG capsule Commonly known as: MINIPRESS  Take 1 mg by mouth in the morning. Take 1mg  (1 tab) in AM. Take 2mg  (2 tab) at bedtime (per patient  statement).   predniSONE  10 MG tablet Commonly known as: DELTASONE  Take 4 tablets (40 mg total) by mouth daily with breakfast for 2 days, THEN 3 tablets (30 mg total) daily with breakfast for 3 days, THEN 2 tablets (20 mg total) daily with breakfast for 3 days, THEN 1 tablet (10 mg total) daily with breakfast for 3 days. Start taking on: May 30, 2023   rosuvastatin  40 MG tablet Commonly known as: CRESTOR  Take 1 tablet by mouth daily.   tiZANidine  4 MG capsule Commonly known as: ZANAFLEX  Take 4 mg by mouth 3 (three) times daily.   topiramate  100 MG tablet Commonly known as: TOPAMAX  Take 200 mg by mouth  2 (two) times daily.   trazodone  300 MG tablet Commonly known as: DESYREL  Take 300 mg by mouth at bedtime as needed for sleep.   warfarin 3 MG tablet Commonly known as: COUMADIN  Take 1 tablet (3 mg total) by mouth daily at 4 PM. What changed:  medication strength when to take this        Disposition and follow-up:   Ms.Taleshia Jorstad was discharged from [facility] [place] condition.  At the hospital follow up visit please address:  1.  Follow-up:  a. Left knee pseudogout. On the discharge, left knee pain much improved, after we restarted her prednisone  dose. Patient discharged on a slow steroid taper, colchicine  and naproxen   -Please follow-up on patient's left knee pain. -Discharged on slow steroid taper, and 2 weeks of treatment for pseudogout with colchicine  0.6 mg twice BID and naproxen  500 mg 3 times daily. -If patient still has left knee pain after the 2 weeks, please consider extending tx with colchicine  but at a lower dose, and naproxen  500 mg to twice daily for additional 2 weeks.   b. HTN Holding spironolactone  and losartan  due to softer blood pressures.  -Please reassess if patient antihypertensive needs to be restarted.  c. T2DM Discharged on Semglee  28 units twice daily, and 8 units with meal.  Please reassess if patient needs any  further titration of medicines for diabetes.  d.  2.  Labs / imaging needed at time of follow-up: CBC, BMP.  3.  Pending labs/ test needing follow-up: None.  4.  Medication Changes New medicines: Colchicine  0.6 mg twice daily Naproxen  500 mg 3 times daily   Follow-up Appointments:  Contact information for after-discharge care     Destination     [facility] Preferred SNF .   Service: Skilled Nursing Contact information: [address] [place]  [phone]                     Hospital Course by problem list: Briceyda Abdullah is a 73 y.o. female with pertinent PMH of 2 diabetes on insulin  who presented with left leg pain and rash and was admitted for cellulitis of the LLE, and found to have acute pseudogout of L knee.   Cellulitis of left lower extremity - She presented with 3 days of acute pain, erythema, swelling of the lower left leg secondary to a dog scratch about 1 week prior.  She was afebrile, CBC with mild leukocytosis and blood cultures grew 1/4 Kocuria Rhizophila, felt to be a contaminant.  Imaging without DVT or osteomyelitis.  She was covered broadly with IV vancomycin  and cefepime , in the setting of bilateral knee replacement.  Patient also has this persistent erythematous rash on the left lateral leg, that persisted despite resolution of his cellulitis.  Biopsy revealed spongiform dermatitis, consistent with eczema.  Pseudogout of L knee Patient presented with pain in the left leg, that progressed to severe pain in the left knee.  She had erythema and swelling in the left knee, and arthrocentesis showed intracellular calcium  pyrophosphate crystals and mildly elevated elevated WBCs, consistent with pseudogout.  She received treatment with colchicine  and steroids. Naproxen  was added due to persistent pain despite treatment regimen.  Patient had a rebound of pseudogout flare a day after finishing steroid  dose.  She was started back on Prednisone  50mg . She was discharged with a slow taper for steroid, in addition to colchicine  and naproxen .   - Prednisone   40 mg for 2 more days, 30 mg for 3 more days, 20 mg for 3 more days, and 10 mg for 3 more days. -Please continue colchicine  0.6 twice daily, for additional 2 weeks.  If patient still has significant pain decrease colchicine  to 0.6 mg once a day for additional 2 weeks -Continue naproxen  500 mg 3 times daily for additional 2 weeks.  If patient still has significant pain, decrease naproxen  to 500 mg twice daily or once a day for additional 2 weeks.   T2DM Most recent A1c is 7.4%.  Elevated blood glucose in the setting of steroids use. Pt discharged on 32 units of semglee  BID, and 8u of insulin  with meals. Also restarted pt metformin and ozempic .   Hypertension  Holding spirolactone and losartan  due to softer blood pressure.  Afib Rate/Rhythm controlled without medicine. She takes warfarin for anticoagulation.  Day of discharge, patient's INR is therapeutic, plan to continue warfarin 3 mg.   Chronic Stable Issues Nonintractable Epilepsy - Continued home topiramate  200mg  po BID, Phenytoin  400mg  po qd. HLD - Continued home rosuvastatin  40mg  every day and zetia  10mg  every day  GERD - Continued home Protonix  40mg  qd Anxiety - Continued home Buspar  20mg  TID. Atarax  50mg   Night Terrors - Home prazosin  1mg  HTN : Holding spirolactone and losartan    Discharge Subjective: Patient evaluated at bedside this AM.  Discharge Exam:   BP (!) 115/46 (BP Location: Right Wrist)   Pulse (!) 50   Temp 98 F (36.7 C)   Resp 18   Ht 5' 4 (1.626 m) Comment: per PT  Wt (!) 144.3 kg   SpO2 95%   BMI 54.61 kg/m   Constitutional: Not in acute distress.   Cardiovascular: regular rate and rhythm, no m/r/g Pulmonary/Chest: normal work of breathing on room air, lungs clear to auscultation bilaterally Abdominal: soft, non-tender, non-distended MSK: Left knee  cap mildly tender, no erythema/effusion. Right lower extremity tender to palpation in the anterior shins.  Right knee without redness, mild effusion.SABRA Psych: Mood and affect appropriate.  Pertinent Labs, Studies, and Procedures:     Latest Ref Rng & Units 05/28/2023    4:36 AM 05/25/2023    6:03 AM 05/23/2023    6:10 AM  CBC  WBC 4.0 - 10.5 K/uL 5.8  7.3  8.2   Hemoglobin 12.0 - 15.0 g/dL 87.6  87.5  87.6   Hematocrit 36.0 - 46.0 % 39.0  39.8  38.1   Platelets 150 - 400 K/uL 546  534  488        Latest Ref Rng & Units 05/29/2023    5:56 AM 05/27/2023    4:46 AM 05/26/2023    4:20 AM  CMP  Glucose 70 - 99 mg/dL 731  836  805   BUN 8 - 23 mg/dL 24  20  21    Creatinine 0.44 - 1.00 mg/dL 8.95  9.05  9.14   Sodium 135 - 145 mmol/L 137  139  137   Potassium 3.5 - 5.1 mmol/L 4.5  4.0  3.9   Chloride 98 - 111 mmol/L 102  101  99   CO2 22 - 32 mmol/L 28  29  28    Calcium  8.9 - 10.3 mg/dL 8.8  8.9  8.5     VAS US  LOWER EXTREMITY VENOUS (DVT) (ONLY [facility] & [facility]) Result Date: 05/14/2023  Lower Venous DVT Study Patient Name:  JORDAN CARAVEO  Date of Exam:   05/13/2023 Medical Rec #: [id number]  Accession #:    [id number] Date of Birth: 10-22-1950     Patient Gender: F Patient Age:   83 years Exam Location:  [facility] Procedure:      VAS US  LOWER EXTREMITY VENOUS (DVT) Referring Phys: JOSHUA GEIPLE --------------------------------------------------------------------------------  Indications: Swelling, and Edema.  Limitations: Body habitus and poor ultrasound/tissue interface. Comparison Study: No prior exam. Performing Technologist: Edilia Elden Appl  Examination Guidelines: A complete evaluation includes B-mode imaging, spectral Doppler, color Doppler, and power Doppler as needed of all accessible portions of each vessel. Bilateral testing is considered an integral part of a complete examination. Limited examinations for reoccurring indications may be performed as noted. The reflux portion of the  exam is performed with the patient in reverse Trendelenburg.  +-----+---------------+---------+-----------+----------+--------------+ RIGHTCompressibilityPhasicitySpontaneityPropertiesThrombus Aging +-----+---------------+---------+-----------+----------+--------------+ CFV  Full           Yes      Yes                                 +-----+---------------+---------+-----------+----------+--------------+ SFJ  Full           Yes      Yes                                 +-----+---------------+---------+-----------+----------+--------------+   +---------+---------------+---------+-----------+----------+-------------------+ LEFT     CompressibilityPhasicitySpontaneityPropertiesThrombus Aging      +---------+---------------+---------+-----------+----------+-------------------+ CFV      Full           Yes      Yes                                      +---------+---------------+---------+-----------+----------+-------------------+ SFJ      Full           Yes      Yes                                      +---------+---------------+---------+-----------+----------+-------------------+ FV Prox  Full                                                             +---------+---------------+---------+-----------+----------+-------------------+ FV Mid   Full                                                             +---------+---------------+---------+-----------+----------+-------------------+ FV DistalFull                                                             +---------+---------------+---------+-----------+----------+-------------------+ PFV      Full                                                             +---------+---------------+---------+-----------+----------+-------------------+  POP      Full           Yes      Yes                                      +---------+---------------+---------+-----------+----------+-------------------+ PTV                                                    Limited, patent by                                                        color and doppler.  +---------+---------------+---------+-----------+----------+-------------------+ PERO                                                  Limited, patent by                                                        color and doppler.  +---------+---------------+---------+-----------+----------+-------------------+ Lymph node noted in left groin measuring 4.3 x 1.6 x 1.8 cm with slight vascularity. Limited due to patient's body habitus.    Summary: RIGHT: - No evidence of common femoral vein obstruction.   LEFT: - There is no evidence of deep vein thrombosis in the lower extremity.  - No cystic structure found in the popliteal fossa.  *See table(s) above for measurements and observations. Electronically signed by Norman Serve on 05/14/2023 at 10:58:45 AM.    Final    DG Tibia/Fibula Left Result Date: 05/13/2023 CLINICAL DATA:  Cellulitis EXAM: LEFT TIBIA AND FIBULA - 2 VIEW COMPARISON:  None Available. FINDINGS: Knee prosthesis. Screw fixation proximal anterior tibia. No acute fracture, dislocation or subluxation. Posterior and plantar calcaneal spurs. No osteolytic or osteoblastic lesions. Diffuse soft tissue swelling of the calf. IMPRESSION: Postsurgical changes. Soft tissue swelling. No acute osseous abnormalities. Electronically Signed   By: Fonda Field M.D.   On: 05/13/2023 10:32     Discharge Instructions: Discharge Instructions     Call MD for:  redness, tenderness, or signs of infection (pain, swelling, redness, odor or green/yellow discharge around incision site)   Complete by: As directed    Call MD for:  severe uncontrolled pain   Complete by: As directed    Call MD for:  temperature >100.4   Complete by: As directed    Diet - low sodium heart healthy   Complete by: As directed    Increase activity slowly   Complete by:  As directed        Signed: Celestina Czar, MD 05/29/2023, 1:42 PM   Pager: [phone]

## 2023-05-29 NOTE — Discharge Planning (Signed)
 Discharge packet given to PT[facility], along with prescription. IV access removed, patient being discharged with [facility] to [facility], report given to nurse Judeth Cornfield at [facility].

## 2023-06-09 LAB — CULTURE, FUNGUS WITHOUT SMEAR

## 2023-07-03 LAB — ACID FAST CULTURE WITH REFLEXED SENSITIVITIES (MYCOBACTERIA): Acid Fast Culture: NEGATIVE

## 2023-10-04 NOTE — Progress Notes (Shared)
 [facility] - Clinic Note  10/10/2023   CHIEF COMPLAINT Patient presents for No chief complaint on file.  HISTORY OF PRESENT ILLNESS: Donna Snow is a 73 y.o. female who presents to the clinic today for:   Referring physician: [facility] [address] [place],  [place] [address]  HISTORICAL INFORMATION:  Selected notes from the MEDICAL RECORD NUMBER Referred by Dr. Merlyn Starring:  Ocular Hx- PMH-   CURRENT MEDICATIONS: No current outpatient medications on file. (Ophthalmic Drugs)   No current facility-administered medications for this visit. (Ophthalmic Drugs)   Current Outpatient Medications (Other)  Medication Sig   acetaminophen  (TYLENOL ) 500 MG tablet Take 2 tablets (1,000 mg total) by mouth every 6 (six) hours.   albuterol  (PROVENTIL ) (2.5 MG/3ML) 0.083% nebulizer solution Inhale 3 mLs (2.5 mg total) into the lungs every 4 (four) hours as needed for shortness of breath.   albuterol  (VENTOLIN  HFA) 108 (90 Base) MCG/ACT inhaler Inhale 2 puffs into the lungs every 4 (four) hours as needed for wheezing or shortness of breath.   bisacodyl  (FLEET) 10 MG/30ML ENEM Place 10 mg rectally daily.   busPIRone  (BUSPAR ) 10 MG tablet Take 20 mg by mouth 3 (three) times daily.   colchicine  0.6 MG tablet Take 1 tablet (0.6 mg total) by mouth 2 (two) times daily for 14 days.   diclofenac  Sodium (VOLTAREN ) 1 % GEL Apply 4 g topically 4 (four) times daily.   donepezil (ARICEPT) 5 MG tablet Take 5 mg by mouth daily.   ezetimibe  (ZETIA ) 10 MG tablet Take 10 mg by mouth daily.   FLUoxetine  (PROZAC ) 20 MG capsule Take 20 mg by mouth daily. With 40mg  for total of 60mg    FLUoxetine  (PROZAC ) 20 MG capsule Take 3 capsules (60 mg total) by mouth daily.   FLUoxetine  (PROZAC ) 40 MG capsule Take 40 mg by mouth daily. With 20mg  for total of 60mg    fluticasone  furoate-vilanterol (BREO ELLIPTA ) 100-25 MCG/INH AEPB Inhale 1 puff into the lungs daily as needed (for  shortness of breath). (Patient not taking: Reported on 05/13/2023)   furosemide  (LASIX ) 20 MG tablet Take 3 tablets (60 mg total) by mouth daily.   hydrOXYzine  (ATARAX ) 50 MG tablet Take 50 mg by mouth 2 (two) times daily.   hyoscyamine  (LEVSIN SL) 0.125 MG SL tablet Take 0.125 mg by mouth daily as needed for cramping.   insulin  aspart (NOVOLOG ) 100 UNIT/ML injection Inject 8 Units into the skin 3 (three) times daily with meals.   insulin  aspart (NOVOLOG ) 100 UNIT/ML injection Inject 0-20 Units into the skin 3 (three) times daily with meals. This is a sliding scale in addition to scheduled Novolog  dose.  CBG < 70: Implement Hypoglycemia Standing Orders and refer to Hypoglycemia Standing Orders sidebar report CBG 70 - 120: 0 units CBG 121 - 150: 3 units CBG 151 - 200: 4 units CBG 201 - 250: 7 units CBG 251 - 300: 11 units CBG 301 - 350: 15 units CBG 351 - 400: 20 units   insulin  glargine-yfgn (SEMGLEE ) 100 UNIT/ML injection Inject 0.32 mLs (32 Units total) into the skin 2 (two) times daily.   lidocaine  (LIDODERM ) 5 % Place 1 patch onto the skin 2 (two) times daily. Remove & Discard patch within 12 hours or as directed by MD   loratadine  (CLARITIN ) 10 MG tablet Take 10 mg by mouth daily.   [Paused] losartan  (COZAAR ) 50 MG tablet Take 50 mg by mouth daily.   metFORMIN (GLUCOPHAGE)  500 MG tablet Take 500 mg by mouth 2 (two) times daily with a meal.   omeprazole (PRILOSEC) 40 MG capsule Take 40 mg by mouth daily.   OZEMPIC , 0.25 OR 0.5 MG/DOSE, 2 MG/3ML SOPN Inject 0.5 mg into the skin once a week. Wednesday   phenytoin  (DILANTIN ) 100 MG ER capsule Take 400 mg by mouth daily.   polyethylene glycol powder (GLYCOLAX /MIRALAX ) 17 GM/SCOOP powder Take 1 Container by mouth once. Takes 1 scoop per day   prazosin  (MINIPRESS ) 1 MG capsule Take 1 mg by mouth in the morning. Take 1mg  (1 tab) in AM. Take 2mg  (2 tab) at bedtime (per patient statement).   rosuvastatin  (CRESTOR ) 40 MG tablet Take 1 tablet by  mouth daily.   [Paused] spironolactone  (ALDACTONE ) 25 MG tablet Take 0.5 tablets (12.5 mg total) by mouth daily.   tiZANidine  (ZANAFLEX ) 4 MG capsule Take 4 mg by mouth 3 (three) times daily.   topiramate  (TOPAMAX ) 100 MG tablet Take 200 mg by mouth 2 (two) times daily.   trazodone  (DESYREL ) 300 MG tablet Take 300 mg by mouth at bedtime as needed for sleep.   warfarin (COUMADIN ) 3 MG tablet Take 1 tablet (3 mg total) by mouth daily at 4 PM.   No current facility-administered medications for this visit. (Other)   REVIEW OF SYSTEMS:  ALLERGIES Allergies  Allergen Reactions   Latex Other (See Comments)    Welts - cant wear them, but can be touched by someone who wears them.   PAST MEDICAL HISTORY Past Medical History:  Diagnosis Date   Abdominal wall abscess 10/15/2020   Abdominal wall cellulitis 10/15/2020   Anxiety    Cellulitis 06/04/2021   Cellulitis of abdominal wall 02/14/2022   CHF (congestive heart failure) (HCC)    Diabetes mellitus    Hypercholesteremia    Hypertension    Obesity    Seizures (HCC)    Stroke [facility])    Past Surgical History:  Procedure Laterality Date   ABDOMINAL HYSTERECTOMY     ABDOMINAL SURGERY     CHOLECYSTECTOMY     JOINT REPLACEMENT     FAMILY HISTORY Family History  Adopted: Yes   SOCIAL HISTORY Social History   Tobacco Use   Smoking status: Never   Smokeless tobacco: Never  Vaping Use   Vaping status: Never Used  Substance Use Topics   Alcohol use: No   Drug use: No       OPHTHALMIC EXAM:  Not recorded    IMAGING AND PROCEDURES  Imaging and Procedures for 10/10/2023        ASSESSMENT/PLAN: No diagnosis found. 1.  2.  3.  Ophthalmic Meds Ordered this visit:  No orders of the defined types were placed in this encounter.    No follow-ups on file.  There are no Patient Instructions on file for this visit.  Explained the diagnoses, plan, and follow up with the patient and they expressed understanding.  Patient  expressed understanding of the importance of proper follow up care.   This document serves as a record of services personally performed by Jeanice Millard, MD, PhD. It was created on their behalf by Angelia Kelp, an ophthalmic technician. The creation of this record is the provider's dictation and/or activities during the visit.    Electronically signed by: Angelia Kelp, OA, 10/04/23  9:21 AM   Jeanice Millard, M.D., Ph.D. Diseases & Surgery of the Retina and Vitreous [facility] 10/10/2023  Abbreviations: M myopia (nearsighted);  A astigmatism; H hyperopia (farsighted); P presbyopia; Mrx spectacle prescription;  CTL contact lenses; OD right eye; OS left eye; OU both eyes  XT exotropia; ET esotropia; PEK punctate epithelial keratitis; PEE punctate epithelial erosions; DES dry eye syndrome; MGD meibomian gland dysfunction; ATs artificial tears; PFAT's preservative free artificial tears; NSC nuclear sclerotic cataract; PSC posterior subcapsular cataract; ERM epi-retinal membrane; PVD posterior vitreous detachment; RD retinal detachment; DM diabetes mellitus; DR diabetic retinopathy; NPDR non-proliferative diabetic retinopathy; PDR proliferative diabetic retinopathy; CSME clinically significant macular edema; DME diabetic macular edema; dbh dot blot hemorrhages; CWS cotton wool spot; POAG primary open angle glaucoma; C/D cup-to-disc ratio; HVF humphrey visual field; GVF goldmann visual field; OCT optical coherence tomography; IOP intraocular pressure; BRVO Branch retinal vein occlusion; CRVO central retinal vein occlusion; CRAO central retinal artery occlusion; BRAO branch retinal artery occlusion; RT retinal tear; SB scleral buckle; PPV pars plana vitrectomy; VH Vitreous hemorrhage; PRP panretinal laser photocoagulation; IVK intravitreal kenalog; VMT vitreomacular traction; MH Macular hole;  NVD neovascularization of the disc; NVE neovascularization elsewhere; AREDS age  related eye disease study; ARMD age related macular degeneration; POAG primary open angle glaucoma; EBMD epithelial/anterior basement membrane dystrophy; ACIOL anterior chamber intraocular lens; IOL intraocular lens; PCIOL posterior chamber intraocular lens; Phaco/IOL phacoemulsification with intraocular lens placement; PRK photorefractive keratectomy; LASIK laser assisted in situ keratomileusis; HTN hypertension; DM diabetes mellitus; COPD chronic obstructive pulmonary disease

## 2023-10-10 ENCOUNTER — Encounter (INDEPENDENT_AMBULATORY_CARE_PROVIDER_SITE_OTHER): Admitting: Ophthalmology

## 2023-10-10 DIAGNOSIS — H3581 Retinal edema: Secondary | ICD-10-CM

## 2023-10-31 NOTE — Progress Notes (Shared)
 [facility] - Clinic Note  11/07/2023   CHIEF COMPLAINT Patient presents for No chief complaint on file.  HISTORY OF PRESENT ILLNESS: Donna Snow is a 73 y.o. female who presents to the clinic today for:   Referring physician: [facility] [address] [place],  [place] [address]  HISTORICAL INFORMATION:  Selected notes from the MEDICAL RECORD NUMBER Referred by Dr. Merlyn Starring:  Ocular Hx- PMH-   CURRENT MEDICATIONS: No current outpatient medications on file. (Ophthalmic Drugs)   No current facility-administered medications for this visit. (Ophthalmic Drugs)   Current Outpatient Medications (Other)  Medication Sig   acetaminophen  (TYLENOL ) 500 MG tablet Take 2 tablets (1,000 mg total) by mouth every 6 (six) hours.   albuterol  (PROVENTIL ) (2.5 MG/3ML) 0.083% nebulizer solution Inhale 3 mLs (2.5 mg total) into the lungs every 4 (four) hours as needed for shortness of breath.   albuterol  (VENTOLIN  HFA) 108 (90 Base) MCG/ACT inhaler Inhale 2 puffs into the lungs every 4 (four) hours as needed for wheezing or shortness of breath.   bisacodyl  (FLEET) 10 MG/30ML ENEM Place 10 mg rectally daily.   busPIRone  (BUSPAR ) 10 MG tablet Take 20 mg by mouth 3 (three) times daily.   colchicine  0.6 MG tablet Take 1 tablet (0.6 mg total) by mouth 2 (two) times daily for 14 days.   diclofenac  Sodium (VOLTAREN ) 1 % GEL Apply 4 g topically 4 (four) times daily.   donepezil (ARICEPT) 5 MG tablet Take 5 mg by mouth daily.   ezetimibe  (ZETIA ) 10 MG tablet Take 10 mg by mouth daily.   FLUoxetine  (PROZAC ) 20 MG capsule Take 20 mg by mouth daily. With 40mg  for total of 60mg    FLUoxetine  (PROZAC ) 20 MG capsule Take 3 capsules (60 mg total) by mouth daily.   FLUoxetine  (PROZAC ) 40 MG capsule Take 40 mg by mouth daily. With 20mg  for total of 60mg    fluticasone  furoate-vilanterol (BREO ELLIPTA ) 100-25 MCG/INH AEPB Inhale 1 puff into the lungs daily as needed (for  shortness of breath). (Patient not taking: Reported on 05/13/2023)   furosemide  (LASIX ) 20 MG tablet Take 3 tablets (60 mg total) by mouth daily.   hydrOXYzine  (ATARAX ) 50 MG tablet Take 50 mg by mouth 2 (two) times daily.   hyoscyamine  (LEVSIN  SL) 0.125 MG SL tablet Take 0.125 mg by mouth daily as needed for cramping.   insulin  aspart (NOVOLOG ) 100 UNIT/ML injection Inject 8 Units into the skin 3 (three) times daily with meals.   insulin  aspart (NOVOLOG ) 100 UNIT/ML injection Inject 0-20 Units into the skin 3 (three) times daily with meals. This is a sliding scale in addition to scheduled Novolog  dose.  CBG < 70: Implement Hypoglycemia Standing Orders and refer to Hypoglycemia Standing Orders sidebar report CBG 70 - 120: 0 units CBG 121 - 150: 3 units CBG 151 - 200: 4 units CBG 201 - 250: 7 units CBG 251 - 300: 11 units CBG 301 - 350: 15 units CBG 351 - 400: 20 units   insulin  glargine-yfgn (SEMGLEE ) 100 UNIT/ML injection Inject 0.32 mLs (32 Units total) into the skin 2 (two) times daily.   lidocaine  (LIDODERM ) 5 % Place 1 patch onto the skin 2 (two) times daily. Remove & Discard patch within 12 hours or as directed by MD   loratadine  (CLARITIN ) 10 MG tablet Take 10 mg by mouth daily.   [Paused] losartan  (COZAAR ) 50 MG tablet Take 50 mg by mouth daily.   metFORMIN (GLUCOPHAGE)  500 MG tablet Take 500 mg by mouth 2 (two) times daily with a meal.   omeprazole (PRILOSEC) 40 MG capsule Take 40 mg by mouth daily.   OZEMPIC , 0.25 OR 0.5 MG/DOSE, 2 MG/3ML SOPN Inject 0.5 mg into the skin once a week. Wednesday   phenytoin  (DILANTIN ) 100 MG ER capsule Take 400 mg by mouth daily.   polyethylene glycol powder (GLYCOLAX /MIRALAX ) 17 GM/SCOOP powder Take 1 Container by mouth once. Takes 1 scoop per day   prazosin  (MINIPRESS ) 1 MG capsule Take 1 mg by mouth in the morning. Take 1mg  (1 tab) in AM. Take 2mg  (2 tab) at bedtime (per patient statement).   rosuvastatin  (CRESTOR ) 40 MG tablet Take 1 tablet by  mouth daily.   [Paused] spironolactone  (ALDACTONE ) 25 MG tablet Take 0.5 tablets (12.5 mg total) by mouth daily.   tiZANidine  (ZANAFLEX ) 4 MG capsule Take 4 mg by mouth 3 (three) times daily.   topiramate  (TOPAMAX ) 100 MG tablet Take 200 mg by mouth 2 (two) times daily.   trazodone  (DESYREL ) 300 MG tablet Take 300 mg by mouth at bedtime as needed for sleep.   warfarin (COUMADIN ) 3 MG tablet Take 1 tablet (3 mg total) by mouth daily at 4 PM.   No current facility-administered medications for this visit. (Other)   REVIEW OF SYSTEMS:  ALLERGIES Allergies  Allergen Reactions   Latex Other (See Comments)    Welts - cant wear them, but can be touched by someone who wears them.   PAST MEDICAL HISTORY Past Medical History:  Diagnosis Date   Abdominal wall abscess 10/15/2020   Abdominal wall cellulitis 10/15/2020   Anxiety    Cellulitis 06/04/2021   Cellulitis of abdominal wall 02/14/2022   CHF (congestive heart failure) (HCC)    Diabetes mellitus    Hypercholesteremia    Hypertension    Obesity    Seizures (HCC)    Stroke [facility])    Past Surgical History:  Procedure Laterality Date   ABDOMINAL HYSTERECTOMY     ABDOMINAL SURGERY     CHOLECYSTECTOMY     JOINT REPLACEMENT     FAMILY HISTORY Family History  Adopted: Yes   SOCIAL HISTORY Social History   Tobacco Use   Smoking status: Never   Smokeless tobacco: Never  Vaping Use   Vaping status: Never Used  Substance Use Topics   Alcohol use: No   Drug use: No       OPHTHALMIC EXAM:  Not recorded    IMAGING AND PROCEDURES  Imaging and Procedures for 11/07/2023        ASSESSMENT/PLAN: No diagnosis found. 1.  2.  3.  Ophthalmic Meds Ordered this visit:  No orders of the defined types were placed in this encounter.    No follow-ups on file.  There are no Patient Instructions on file for this visit.  Explained the diagnoses, plan, and follow up with the patient and they expressed understanding.  Patient  expressed understanding of the importance of proper follow up care.   This document serves as a record of services personally performed by Jeanice Millard, MD, PhD. It was created on their behalf by Angelia Kelp, an ophthalmic technician. The creation of this record is the provider's dictation and/or activities during the visit.    Electronically signed by: Angelia Kelp, OA, 10/31/23  9:47 AM   Jeanice Millard, M.D., Ph.D. Diseases & Surgery of the Retina and Vitreous [facility] 11/07/2023  Abbreviations: M myopia (nearsighted);  A astigmatism; H hyperopia (farsighted); P presbyopia; Mrx spectacle prescription;  CTL contact lenses; OD right eye; OS left eye; OU both eyes  XT exotropia; ET esotropia; PEK punctate epithelial keratitis; PEE punctate epithelial erosions; DES dry eye syndrome; MGD meibomian gland dysfunction; ATs artificial tears; PFAT's preservative free artificial tears; NSC nuclear sclerotic cataract; PSC posterior subcapsular cataract; ERM epi-retinal membrane; PVD posterior vitreous detachment; RD retinal detachment; DM diabetes mellitus; DR diabetic retinopathy; NPDR non-proliferative diabetic retinopathy; PDR proliferative diabetic retinopathy; CSME clinically significant macular edema; DME diabetic macular edema; dbh dot blot hemorrhages; CWS cotton wool spot; POAG primary open angle glaucoma; C/D cup-to-disc ratio; HVF humphrey visual field; GVF goldmann visual field; OCT optical coherence tomography; IOP intraocular pressure; BRVO Branch retinal vein occlusion; CRVO central retinal vein occlusion; CRAO central retinal artery occlusion; BRAO branch retinal artery occlusion; RT retinal tear; SB scleral buckle; PPV pars plana vitrectomy; VH Vitreous hemorrhage; PRP panretinal laser photocoagulation; IVK intravitreal kenalog; VMT vitreomacular traction; MH Macular hole;  NVD neovascularization of the disc; NVE neovascularization elsewhere; AREDS age  related eye disease study; ARMD age related macular degeneration; POAG primary open angle glaucoma; EBMD epithelial/anterior basement membrane dystrophy; ACIOL anterior chamber intraocular lens; IOL intraocular lens; PCIOL posterior chamber intraocular lens; Phaco/IOL phacoemulsification with intraocular lens placement; PRK photorefractive keratectomy; LASIK laser assisted in situ keratomileusis; HTN hypertension; DM diabetes mellitus; COPD chronic obstructive pulmonary disease

## 2023-11-07 ENCOUNTER — Encounter (INDEPENDENT_AMBULATORY_CARE_PROVIDER_SITE_OTHER): Admitting: Ophthalmology

## 2023-11-07 DIAGNOSIS — H3581 Retinal edema: Secondary | ICD-10-CM

## 2023-11-19 NOTE — Progress Notes (Incomplete)
 [facility] - Clinic Note  11/20/2023     CHIEF COMPLAINT Patient presents for No chief complaint on file.   HISTORY OF PRESENT ILLNESS: Donna Snow is a 73 y.o. female who presents to the clinic today for:     Referring physician: [facility] [address] [place],  [place] [address]  HISTORICAL INFORMATION:   Selected notes from the MEDICAL RECORD NUMBER Referred by Dr. JAMA:  Ocular Hx- PMH-    CURRENT MEDICATIONS: No current outpatient medications on file. (Ophthalmic Drugs)   No current facility-administered medications for this visit. (Ophthalmic Drugs)   Current Outpatient Medications (Other)  Medication Sig   acetaminophen  (TYLENOL ) 500 MG tablet Take 2 tablets (1,000 mg total) by mouth every 6 (six) hours.   albuterol  (PROVENTIL ) (2.5 MG/3ML) 0.083% nebulizer solution Inhale 3 mLs (2.5 mg total) into the lungs every 4 (four) hours as needed for shortness of breath.   albuterol  (VENTOLIN  HFA) 108 (90 Base) MCG/ACT inhaler Inhale 2 puffs into the lungs every 4 (four) hours as needed for wheezing or shortness of breath.   bisacodyl  (FLEET) 10 MG/30ML ENEM Place 10 mg rectally daily.   busPIRone  (BUSPAR ) 10 MG tablet Take 20 mg by mouth 3 (three) times daily.   colchicine  0.6 MG tablet Take 1 tablet (0.6 mg total) by mouth 2 (two) times daily for 14 days.   diclofenac  Sodium (VOLTAREN ) 1 % GEL Apply 4 g topically 4 (four) times daily.   donepezil (ARICEPT) 5 MG tablet Take 5 mg by mouth daily.   ezetimibe  (ZETIA ) 10 MG tablet Take 10 mg by mouth daily.   FLUoxetine  (PROZAC ) 20 MG capsule Take 20 mg by mouth daily. With 40mg  for total of 60mg    FLUoxetine  (PROZAC ) 20 MG capsule Take 3 capsules (60 mg total) by mouth daily.   FLUoxetine  (PROZAC ) 40 MG capsule Take 40 mg by mouth daily. With 20mg  for total of 60mg    fluticasone  furoate-vilanterol (BREO ELLIPTA ) 100-25 MCG/INH AEPB Inhale 1 puff into the lungs daily as  needed (for shortness of breath). (Patient not taking: Reported on 05/13/2023)   furosemide  (LASIX ) 20 MG tablet Take 3 tablets (60 mg total) by mouth daily.   hydrOXYzine  (ATARAX ) 50 MG tablet Take 50 mg by mouth 2 (two) times daily.   hyoscyamine  (LEVSIN  SL) 0.125 MG SL tablet Take 0.125 mg by mouth daily as needed for cramping.   insulin  aspart (NOVOLOG ) 100 UNIT/ML injection Inject 8 Units into the skin 3 (three) times daily with meals.   insulin  aspart (NOVOLOG ) 100 UNIT/ML injection Inject 0-20 Units into the skin 3 (three) times daily with meals. This is a sliding scale in addition to scheduled Novolog  dose.  CBG < 70: Implement Hypoglycemia Standing Orders and refer to Hypoglycemia Standing Orders sidebar report CBG 70 - 120: 0 units CBG 121 - 150: 3 units CBG 151 - 200: 4 units CBG 201 - 250: 7 units CBG 251 - 300: 11 units CBG 301 - 350: 15 units CBG 351 - 400: 20 units   insulin  glargine-yfgn (SEMGLEE ) 100 UNIT/ML injection Inject 0.32 mLs (32 Units total) into the skin 2 (two) times daily.   lidocaine  (LIDODERM ) 5 % Place 1 patch onto the skin 2 (two) times daily. Remove & Discard patch within 12 hours or as directed by MD   loratadine  (CLARITIN ) 10 MG tablet Take 10 mg by mouth daily.   [Paused] losartan  (COZAAR ) 50 MG tablet Take 50 mg  by mouth daily.   metFORMIN (GLUCOPHAGE) 500 MG tablet Take 500 mg by mouth 2 (two) times daily with a meal.   omeprazole (PRILOSEC) 40 MG capsule Take 40 mg by mouth daily.   OZEMPIC , 0.25 OR 0.5 MG/DOSE, 2 MG/3ML SOPN Inject 0.5 mg into the skin once a week. Wednesday   phenytoin  (DILANTIN ) 100 MG ER capsule Take 400 mg by mouth daily.   polyethylene glycol powder (GLYCOLAX /MIRALAX ) 17 GM/SCOOP powder Take 1 Container by mouth once. Takes 1 scoop per day   prazosin  (MINIPRESS ) 1 MG capsule Take 1 mg by mouth in the morning. Take 1mg  (1 tab) in AM. Take 2mg  (2 tab) at bedtime (per patient statement).   rosuvastatin  (CRESTOR ) 40 MG tablet Take 1  tablet by mouth daily.   [Paused] spironolactone  (ALDACTONE ) 25 MG tablet Take 0.5 tablets (12.5 mg total) by mouth daily.   tiZANidine  (ZANAFLEX ) 4 MG capsule Take 4 mg by mouth 3 (three) times daily.   topiramate  (TOPAMAX ) 100 MG tablet Take 200 mg by mouth 2 (two) times daily.   trazodone  (DESYREL ) 300 MG tablet Take 300 mg by mouth at bedtime as needed for sleep.   warfarin (COUMADIN ) 3 MG tablet Take 1 tablet (3 mg total) by mouth daily at 4 PM.   No current facility-administered medications for this visit. (Other)      REVIEW OF SYSTEMS:    ALLERGIES Allergies  Allergen Reactions   Latex Other (See Comments)    Welts - cant wear them, but can be touched by someone who wears them.    PAST MEDICAL HISTORY Past Medical History:  Diagnosis Date   Abdominal wall abscess 10/15/2020   Abdominal wall cellulitis 10/15/2020   Anxiety    Cellulitis 06/04/2021   Cellulitis of abdominal wall 02/14/2022   CHF (congestive heart failure) (HCC)    Diabetes mellitus    Hypercholesteremia    Hypertension    Obesity    Seizures (HCC)    Stroke [facility])    Past Surgical History:  Procedure Laterality Date   ABDOMINAL HYSTERECTOMY     ABDOMINAL SURGERY     CHOLECYSTECTOMY     JOINT REPLACEMENT      FAMILY HISTORY Family History  Adopted: Yes    SOCIAL HISTORY Social History   Tobacco Use   Smoking status: Never   Smokeless tobacco: Never  Vaping Use   Vaping status: Never Used  Substance Use Topics   Alcohol use: No   Drug use: No         OPHTHALMIC EXAM:  Not recorded     IMAGING AND PROCEDURES  Imaging and Procedures for 11/20/2023           ASSESSMENT/PLAN:  No diagnosis found.  1.  2.  3.  Ophthalmic Meds Ordered this visit:  No orders of the defined types were placed in this encounter.      No follow-ups on file.  There are no Patient Instructions on file for this visit.   Explained the diagnoses, plan, and follow up with the  patient and they expressed understanding.  Patient expressed understanding of the importance of proper follow up care.   This document serves as a record of services personally performed by Redell JUDITHANN Hans, MD, PhD. It was created on their behalf by Auston Muzzy, COMT. The creation of this record is the provider's dictation and/or activities during the visit.  Electronically signed by: Auston Muzzy, COMT 11/19/23 3:18 PM    Redell JUDITHANN Hans,  M.D., Ph.D. Diseases & Surgery of the Retina and Vitreous [facility] @TODAY @     Abbreviations: M myopia (nearsighted); A astigmatism; H hyperopia (farsighted); P presbyopia; Mrx spectacle prescription;  CTL contact lenses; OD right eye; OS left eye; OU both eyes  XT exotropia; ET esotropia; PEK punctate epithelial keratitis; PEE punctate epithelial erosions; DES dry eye syndrome; MGD meibomian gland dysfunction; ATs artificial tears; PFAT's preservative free artificial tears; NSC nuclear sclerotic cataract; PSC posterior subcapsular cataract; ERM epi-retinal membrane; PVD posterior vitreous detachment; RD retinal detachment; DM diabetes mellitus; DR diabetic retinopathy; NPDR non-proliferative diabetic retinopathy; PDR proliferative diabetic retinopathy; CSME clinically significant macular edema; DME diabetic macular edema; dbh dot blot hemorrhages; CWS cotton wool spot; POAG primary open angle glaucoma; C/D cup-to-disc ratio; HVF humphrey visual field; GVF goldmann visual field; OCT optical coherence tomography; IOP intraocular pressure; BRVO Branch retinal vein occlusion; CRVO central retinal vein occlusion; CRAO central retinal artery occlusion; BRAO branch retinal artery occlusion; RT retinal tear; SB scleral buckle; PPV pars plana vitrectomy; VH Vitreous hemorrhage; PRP panretinal laser photocoagulation; IVK intravitreal kenalog; VMT vitreomacular traction; MH Macular hole;  NVD neovascularization of the disc; NVE neovascularization  elsewhere; AREDS age related eye disease study; ARMD age related macular degeneration; POAG primary open angle glaucoma; EBMD epithelial/anterior basement membrane dystrophy; ACIOL anterior chamber intraocular lens; IOL intraocular lens; PCIOL posterior chamber intraocular lens; Phaco/IOL phacoemulsification with intraocular lens placement; PRK photorefractive keratectomy; LASIK laser assisted in situ keratomileusis; HTN hypertension; DM diabetes mellitus; COPD chronic obstructive pulmonary disease

## 2023-11-20 ENCOUNTER — Encounter (INDEPENDENT_AMBULATORY_CARE_PROVIDER_SITE_OTHER): Payer: Self-pay | Admitting: Ophthalmology

## 2023-11-20 ENCOUNTER — Ambulatory Visit (INDEPENDENT_AMBULATORY_CARE_PROVIDER_SITE_OTHER): Admitting: Ophthalmology

## 2023-11-20 DIAGNOSIS — I10 Essential (primary) hypertension: Secondary | ICD-10-CM

## 2023-11-20 DIAGNOSIS — Z794 Long term (current) use of insulin: Secondary | ICD-10-CM

## 2023-11-20 DIAGNOSIS — H35033 Hypertensive retinopathy, bilateral: Secondary | ICD-10-CM

## 2023-11-20 DIAGNOSIS — H353131 Nonexudative age-related macular degeneration, bilateral, early dry stage: Secondary | ICD-10-CM | POA: Diagnosis not present

## 2023-11-20 DIAGNOSIS — Z961 Presence of intraocular lens: Secondary | ICD-10-CM

## 2023-11-20 DIAGNOSIS — Z7985 Long-term (current) use of injectable non-insulin antidiabetic drugs: Secondary | ICD-10-CM

## 2023-11-20 DIAGNOSIS — Z7984 Long term (current) use of oral hypoglycemic drugs: Secondary | ICD-10-CM | POA: Diagnosis not present

## 2023-11-20 DIAGNOSIS — E119 Type 2 diabetes mellitus without complications: Secondary | ICD-10-CM | POA: Diagnosis not present

## 2023-11-20 DIAGNOSIS — H3581 Retinal edema: Secondary | ICD-10-CM

## 2023-11-20 NOTE — Progress Notes (Signed)
 [facility] - Clinic Note  11/20/2023   CHIEF COMPLAINT Patient presents for Retina Evaluation  HISTORY OF PRESENT ILLNESS: Donna Snow is a 73 y.o. female who presents to the clinic today for:  HPI     Retina Evaluation   In both eyes.  I, the attending physician,  performed the HPI with the patient and updated documentation appropriately.        Comments   Retina eval per PCP DM exam pt is reporting no vision changes noticed she denies any flashes or floaters she has been having watering itching and burning pt last reading 312 her last A1C is unknown       Last edited by Valdemar Rogue, MD on 11/20/2023  8:13 PM.    Pt is here on the referral of her PCP for DM exam, she states her PCP found diabetes in one of her eyes, but she doesn't know which one, she states she used to see Dr. Roz, but hasn't seen anyone since he retired, she has an appt with [facility] to get new glasses   Referring physician: [facility] [address] [place],  [place] [address]  HISTORICAL INFORMATION:  Selected notes from the MEDICAL RECORD NUMBER Referred by Luke Miyamoto, NP for a diabetic eye exam LEE:  Ocular Hx- PMH-   CURRENT MEDICATIONS: No current outpatient medications on file. (Ophthalmic Drugs)   No current facility-administered medications for this visit. (Ophthalmic Drugs)   Current Outpatient Medications (Other)  Medication Sig   acetaminophen  (TYLENOL ) 500 MG tablet Take 2 tablets (1,000 mg total) by mouth every 6 (six) hours.   albuterol  (PROVENTIL ) (2.5 MG/3ML) 0.083% nebulizer solution Inhale 3 mLs (2.5 mg total) into the lungs every 4 (four) hours as needed for shortness of breath.   albuterol  (VENTOLIN  HFA) 108 (90 Base) MCG/ACT inhaler Inhale 2 puffs into the lungs every 4 (four) hours as needed for wheezing or shortness of breath.   bisacodyl  (FLEET) 10 MG/30ML ENEM Place 10 mg rectally daily.   busPIRone  (BUSPAR ) 10  MG tablet Take 20 mg by mouth 3 (three) times daily.   colchicine  0.6 MG tablet Take 1 tablet (0.6 mg total) by mouth 2 (two) times daily for 14 days.   diclofenac  Sodium (VOLTAREN ) 1 % GEL Apply 4 g topically 4 (four) times daily.   donepezil (ARICEPT) 5 MG tablet Take 5 mg by mouth daily.   ezetimibe  (ZETIA ) 10 MG tablet Take 10 mg by mouth daily.   FLUoxetine  (PROZAC ) 20 MG capsule Take 20 mg by mouth daily. With 40mg  for total of 60mg    FLUoxetine  (PROZAC ) 20 MG capsule Take 3 capsules (60 mg total) by mouth daily.   FLUoxetine  (PROZAC ) 40 MG capsule Take 40 mg by mouth daily. With 20mg  for total of 60mg    fluticasone  furoate-vilanterol (BREO ELLIPTA ) 100-25 MCG/INH AEPB Inhale 1 puff into the lungs daily as needed (for shortness of breath). (Patient not taking: Reported on 05/13/2023)   furosemide  (LASIX ) 20 MG tablet Take 3 tablets (60 mg total) by mouth daily.   hydrOXYzine  (ATARAX ) 50 MG tablet Take 50 mg by mouth 2 (two) times daily.   hyoscyamine  (LEVSIN  SL) 0.125 MG SL tablet Take 0.125 mg by mouth daily as needed for cramping.   insulin  aspart (NOVOLOG ) 100 UNIT/ML injection Inject 8 Units into the skin 3 (three) times daily with meals.   insulin  aspart (NOVOLOG ) 100 UNIT/ML injection Inject 0-20 Units into the skin 3 (three)  times daily with meals. This is a sliding scale in addition to scheduled Novolog  dose.  CBG < 70: Implement Hypoglycemia Standing Orders and refer to Hypoglycemia Standing Orders sidebar report CBG 70 - 120: 0 units CBG 121 - 150: 3 units CBG 151 - 200: 4 units CBG 201 - 250: 7 units CBG 251 - 300: 11 units CBG 301 - 350: 15 units CBG 351 - 400: 20 units   insulin  glargine-yfgn (SEMGLEE ) 100 UNIT/ML injection Inject 0.32 mLs (32 Units total) into the skin 2 (two) times daily.   lidocaine  (LIDODERM ) 5 % Place 1 patch onto the skin 2 (two) times daily. Remove & Discard patch within 12 hours or as directed by MD   loratadine  (CLARITIN ) 10 MG tablet Take 10 mg  by mouth daily.   [Paused] losartan  (COZAAR ) 50 MG tablet Take 50 mg by mouth daily.   metFORMIN (GLUCOPHAGE) 500 MG tablet Take 500 mg by mouth 2 (two) times daily with a meal.   omeprazole (PRILOSEC) 40 MG capsule Take 40 mg by mouth daily.   OZEMPIC , 0.25 OR 0.5 MG/DOSE, 2 MG/3ML SOPN Inject 0.5 mg into the skin once a week. Wednesday   phenytoin  (DILANTIN ) 100 MG ER capsule Take 400 mg by mouth daily.   polyethylene glycol powder (GLYCOLAX /MIRALAX ) 17 GM/SCOOP powder Take 1 Container by mouth once. Takes 1 scoop per day   prazosin  (MINIPRESS ) 1 MG capsule Take 1 mg by mouth in the morning. Take 1mg  (1 tab) in AM. Take 2mg  (2 tab) at bedtime (per patient statement).   rosuvastatin  (CRESTOR ) 40 MG tablet Take 1 tablet by mouth daily.   [Paused] spironolactone  (ALDACTONE ) 25 MG tablet Take 0.5 tablets (12.5 mg total) by mouth daily.   tiZANidine  (ZANAFLEX ) 4 MG capsule Take 4 mg by mouth 3 (three) times daily.   topiramate  (TOPAMAX ) 100 MG tablet Take 200 mg by mouth 2 (two) times daily.   trazodone  (DESYREL ) 300 MG tablet Take 300 mg by mouth at bedtime as needed for sleep.   warfarin (COUMADIN ) 3 MG tablet Take 1 tablet (3 mg total) by mouth daily at 4 PM.   No current facility-administered medications for this visit. (Other)   REVIEW OF SYSTEMS: ROS   Positive for: Neurological, Endocrine, Cardiovascular, Eyes, Psychiatric, Allergic/Imm Last edited by Resa Delon ORN, COT on 11/20/2023  2:12 PM.     ALLERGIES Allergies  Allergen Reactions   Latex Other (See Comments)    Welts - cant wear them, but can be touched by someone who wears them.   PAST MEDICAL HISTORY Past Medical History:  Diagnosis Date   Abdominal wall abscess 10/15/2020   Abdominal wall cellulitis 10/15/2020   Anxiety    Cellulitis 06/04/2021   Cellulitis of abdominal wall 02/14/2022   CHF (congestive heart failure) (HCC)    Diabetes mellitus    Hypercholesteremia    Hypertension    Obesity    Seizures  (HCC)    Stroke [facility])    Past Surgical History:  Procedure Laterality Date   ABDOMINAL HYSTERECTOMY     ABDOMINAL SURGERY     CHOLECYSTECTOMY     JOINT REPLACEMENT     FAMILY HISTORY Family History  Adopted: Yes   SOCIAL HISTORY Social History   Tobacco Use   Smoking status: Never   Smokeless tobacco: Never  Vaping Use   Vaping status: Never Used  Substance Use Topics   Alcohol use: No   Drug use: No       OPHTHALMIC  EXAM:  Base Eye Exam     Visual Acuity (Snellen - Linear)       Right Left   Dist cc 20/30 -2 20/40   Dist ph cc 20/25 -1 20/30 -2         Tonometry (Tonopen, 2:19 PM)       Right Left   Pressure 16 16         Pupils       Pupils Dark Light Shape React APD   Right PERRL 3 2 Round Brisk None   Left PERRL 3 2 Round Brisk None         Visual Fields       Left Right    Full Full         Extraocular Movement       Right Left    Full, Ortho Full, Ortho         Neuro/Psych     Oriented x3: Yes   Mood/Affect: Normal         Dilation     Both eyes: 2.5% Phenylephrine @ 2:19 PM           Slit Lamp and Fundus Exam     External Exam       Right Left   External Normal Normal         Slit Lamp Exam       Right Left   Lids/Lashes Dermatochalasis - upper lid Dermatochalasis - upper lid   Conjunctiva/Sclera White and quiet White and quiet   Cornea trace PEE, well healed cataract wound trace PEE, well healed cataract wound   Anterior Chamber deep and clear deep, clear, narrow angle   Iris Round and dilated Round and dilated   Lens PC IOL in good position PC IOL in good position   Anterior Vitreous mild syneresis mild syneresis         Fundus Exam       Right Left   Disc mild Pallor, Sharp rim Pink and Sharp   C/D Ratio 0.3 0.2   Macula Flat, Good foveal reflex, fine drusen, No heme or edema Flat, Good foveal reflex, fine drusen, No heme or edema   Vessels mild attenuation, mild tortuosity mild  attenuation, mild tortuosity   Periphery Attached, No heme Attached, No heme           IMAGING AND PROCEDURES  Imaging and Procedures for 11/20/2023  OCT, Retina - OU - Both Eyes       Right Eye Quality was good. Central Foveal Thickness: 226. Progression has no prior data. Findings include normal foveal contour, no IRF, no SRF, retinal drusen (Fine drusen, partial PVD).   Left Eye Quality was good. Central Foveal Thickness: 213. Progression has no prior data. Findings include normal foveal contour, no IRF, no SRF, retinal drusen (Partial PVD, fine drusen).   Notes *Images captured and stored on drive  Diagnosis / Impression:  NFP, no IRF / SRF OU -- no DME OU Fine drusen, partial PVD OU  Clinical management:  See below  Abbreviations: NFP - Normal foveal profile. CME - cystoid macular edema. PED - pigment epithelial detachment. IRF - intraretinal fluid. SRF - subretinal fluid. EZ - ellipsoid zone. ERM - epiretinal membrane. ORA - outer retinal atrophy. ORT - outer retinal tubulation. SRHM - subretinal hyper-reflective material. IRHM - intraretinal hyper-reflective material           ASSESSMENT/PLAN:   ICD-10-CM   1. Diabetes mellitus type 2 without  retinopathy (HCC)  E11.9 OCT, Retina - OU - Both Eyes    2. Current use of insulin  (HCC)  Z79.4     3. Long term (current) use of oral hypoglycemic drugs  Z79.84     4. Long-term (current) use of injectable non-insulin  antidiabetic drugs  Z79.85     5. Essential hypertension  I10     6. Hypertensive retinopathy of both eyes  H35.033     7. Early dry stage nonexudative age-related macular degeneration of both eyes  H35.3131 OCT, Retina - OU - Both Eyes    8. Pseudophakia, both eyes  Z96.1      1-4. Diabetes mellitus, type 2 without retinopathy  - A1c: 7.4 on 08.20.24 - The incidence, risk factors for progression, natural history and treatment options for diabetic retinopathy were discussed with patient.   - The need  for close monitoring of blood glucose, blood pressure, and serum lipids, avoiding cigarette or any type of tobacco, and the need for long term follow up was also discussed with patient. - f/u in 1 year, sooner prn  5,6. Hypertensive retinopathy OU - discussed importance of tight BP control - monitor  7. Age related macular degeneration, non-exudative, both eyes  - early stage with fine drusen OU  - The incidence, anatomy, and pathology of dry AMD, risk of progression, and the AREDS and AREDS 2 study including smoking risks discussed with patient.  - Recommend amsler grid monitoring  - f/u 4 months, DFE, OCT  8. Pseudophakia OU  - s/p CE/IOL OU (Dr. Anselmo)  - IOL in good position, doing well  - monitor  Ophthalmic Meds Ordered this visit:  No orders of the defined types were placed in this encounter.    Return in about 4 months (around 03/22/2024) for f/u non-exu ARMD OU, DFE, OCT.  There are no Patient Instructions on file for this visit.  Explained the diagnoses, plan, and follow up with the patient and they expressed understanding.  Patient expressed understanding of the importance of proper follow up care.   This document serves as a record of services personally performed by Redell JUDITHANN Hans, MD, PhD. It was created on their behalf by Alan PARAS. Delores, OA an ophthalmic technician. The creation of this record is the provider's dictation and/or activities during the visit.    Electronically signed by: Alan PARAS. Delores, OA 11/20/23 8:13 PM  Redell JUDITHANN Hans, M.D., Ph.D. Diseases & Surgery of the Retina and Vitreous [facility] 11/20/2023  I have reviewed the above documentation for accuracy and completeness, and I agree with the above. Redell JUDITHANN Hans, M.D., Ph.D. 11/20/23 8:23 PM   Abbreviations: M myopia (nearsighted); A astigmatism; H hyperopia (farsighted); P presbyopia; Mrx spectacle prescription;  CTL contact lenses; OD right eye; OS left eye; OU both  eyes  XT exotropia; ET esotropia; PEK punctate epithelial keratitis; PEE punctate epithelial erosions; DES dry eye syndrome; MGD meibomian gland dysfunction; ATs artificial tears; PFAT's preservative free artificial tears; NSC nuclear sclerotic cataract; PSC posterior subcapsular cataract; ERM epi-retinal membrane; PVD posterior vitreous detachment; RD retinal detachment; DM diabetes mellitus; DR diabetic retinopathy; NPDR non-proliferative diabetic retinopathy; PDR proliferative diabetic retinopathy; CSME clinically significant macular edema; DME diabetic macular edema; dbh dot blot hemorrhages; CWS cotton wool spot; POAG primary open angle glaucoma; C/D cup-to-disc ratio; HVF humphrey visual field; GVF goldmann visual field; OCT optical coherence tomography; IOP intraocular pressure; BRVO Branch retinal vein occlusion; CRVO central retinal vein occlusion; CRAO central  retinal artery occlusion; BRAO branch retinal artery occlusion; RT retinal tear; SB scleral buckle; PPV pars plana vitrectomy; VH Vitreous hemorrhage; PRP panretinal laser photocoagulation; IVK intravitreal kenalog; VMT vitreomacular traction; MH Macular hole;  NVD neovascularization of the disc; NVE neovascularization elsewhere; AREDS age related eye disease study; ARMD age related macular degeneration; POAG primary open angle glaucoma; EBMD epithelial/anterior basement membrane dystrophy; ACIOL anterior chamber intraocular lens; IOL intraocular lens; PCIOL posterior chamber intraocular lens; Phaco/IOL phacoemulsification with intraocular lens placement; PRK photorefractive keratectomy; LASIK laser assisted in situ keratomileusis; HTN hypertension; DM diabetes mellitus; COPD chronic obstructive pulmonary disease

## 2024-02-15 ENCOUNTER — Other Ambulatory Visit (HOSPITAL_BASED_OUTPATIENT_CLINIC_OR_DEPARTMENT_OTHER): Payer: Self-pay

## 2024-03-04 NOTE — Progress Notes (Shared)
 [facility] - Clinic Note  03/18/2024   CHIEF COMPLAINT Patient presents for No chief complaint on file.  HISTORY OF PRESENT ILLNESS: Donna Snow is a 73 y.o. female who presents to the clinic today for:   Pt is here on the referral of her PCP for DM exam, she states her PCP found diabetes in one of her eyes, but she doesn't know which one, she states she used to see Dr. Roz, but hasn't seen anyone since he retired, she has an appt with [facility] to get new glasses   Referring physician: [facility] [address] [place],  [place] [address]  HISTORICAL INFORMATION:  Selected notes from the MEDICAL RECORD NUMBER Referred by Luke Miyamoto, NP for a diabetic eye exam LEE:  Ocular Hx- PMH-   CURRENT MEDICATIONS: No current outpatient medications on file. (Ophthalmic Drugs)   No current facility-administered medications for this visit. (Ophthalmic Drugs)   Current Outpatient Medications (Other)  Medication Sig   acetaminophen  (TYLENOL ) 500 MG tablet Take 2 tablets (1,000 mg total) by mouth every 6 (six) hours.   albuterol  (PROVENTIL ) (2.5 MG/3ML) 0.083% nebulizer solution Inhale 3 mLs (2.5 mg total) into the lungs every 4 (four) hours as needed for shortness of breath.   albuterol  (VENTOLIN  HFA) 108 (90 Base) MCG/ACT inhaler Inhale 2 puffs into the lungs every 4 (four) hours as needed for wheezing or shortness of breath.   bisacodyl  (FLEET) 10 MG/30ML ENEM Place 10 mg rectally daily.   busPIRone  (BUSPAR ) 10 MG tablet Take 20 mg by mouth 3 (three) times daily.   colchicine  0.6 MG tablet Take 1 tablet (0.6 mg total) by mouth 2 (two) times daily for 14 days.   diclofenac  Sodium (VOLTAREN ) 1 % GEL Apply 4 g topically 4 (four) times daily.   donepezil (ARICEPT) 5 MG tablet Take 5 mg by mouth daily.   ezetimibe  (ZETIA ) 10 MG tablet Take 10 mg by mouth daily.   FLUoxetine  (PROZAC ) 20 MG capsule Take 20 mg by mouth daily. With 40mg   for total of 60mg    FLUoxetine  (PROZAC ) 20 MG capsule Take 3 capsules (60 mg total) by mouth daily.   FLUoxetine  (PROZAC ) 40 MG capsule Take 40 mg by mouth daily. With 20mg  for total of 60mg    fluticasone  furoate-vilanterol (BREO ELLIPTA ) 100-25 MCG/INH AEPB Inhale 1 puff into the lungs daily as needed (for shortness of breath). (Patient not taking: Reported on 05/13/2023)   furosemide  (LASIX ) 20 MG tablet Take 3 tablets (60 mg total) by mouth daily.   hydrOXYzine  (ATARAX ) 50 MG tablet Take 50 mg by mouth 2 (two) times daily.   hyoscyamine  (LEVSIN  SL) 0.125 MG SL tablet Take 0.125 mg by mouth daily as needed for cramping.   insulin  aspart (NOVOLOG ) 100 UNIT/ML injection Inject 8 Units into the skin 3 (three) times daily with meals.   insulin  aspart (NOVOLOG ) 100 UNIT/ML injection Inject 0-20 Units into the skin 3 (three) times daily with meals. This is a sliding scale in addition to scheduled Novolog  dose.  CBG < 70: Implement Hypoglycemia Standing Orders and refer to Hypoglycemia Standing Orders sidebar report CBG 70 - 120: 0 units CBG 121 - 150: 3 units CBG 151 - 200: 4 units CBG 201 - 250: 7 units CBG 251 - 300: 11 units CBG 301 - 350: 15 units CBG 351 - 400: 20 units   insulin  glargine-yfgn (SEMGLEE ) 100 UNIT/ML injection Inject 0.32 mLs (32 Units total) into the skin  2 (two) times daily.   lidocaine  (LIDODERM ) 5 % Place 1 patch onto the skin 2 (two) times daily. Remove & Discard patch within 12 hours or as directed by MD   loratadine  (CLARITIN ) 10 MG tablet Take 10 mg by mouth daily.   [Paused] losartan  (COZAAR ) 50 MG tablet Take 50 mg by mouth daily.   metFORMIN (GLUCOPHAGE) 500 MG tablet Take 500 mg by mouth 2 (two) times daily with a meal.   omeprazole (PRILOSEC) 40 MG capsule Take 40 mg by mouth daily.   OZEMPIC , 0.25 OR 0.5 MG/DOSE, 2 MG/3ML SOPN Inject 0.5 mg into the skin once a week. Wednesday   phenytoin  (DILANTIN ) 100 MG ER capsule Take 400 mg by mouth daily.   polyethylene  glycol powder (GLYCOLAX /MIRALAX ) 17 GM/SCOOP powder Take 1 Container by mouth once. Takes 1 scoop per day   prazosin  (MINIPRESS ) 1 MG capsule Take 1 mg by mouth in the morning. Take 1mg  (1 tab) in AM. Take 2mg  (2 tab) at bedtime (per patient statement).   rosuvastatin  (CRESTOR ) 40 MG tablet Take 1 tablet by mouth daily.   [Paused] spironolactone  (ALDACTONE ) 25 MG tablet Take 0.5 tablets (12.5 mg total) by mouth daily.   tiZANidine  (ZANAFLEX ) 4 MG capsule Take 4 mg by mouth 3 (three) times daily.   topiramate  (TOPAMAX ) 100 MG tablet Take 200 mg by mouth 2 (two) times daily.   trazodone  (DESYREL ) 300 MG tablet Take 300 mg by mouth at bedtime as needed for sleep.   warfarin (COUMADIN ) 3 MG tablet Take 1 tablet (3 mg total) by mouth daily at 4 PM.   No current facility-administered medications for this visit. (Other)   REVIEW OF SYSTEMS:   ALLERGIES Allergies  Allergen Reactions   Latex Other (See Comments)    Welts - cant wear them, but can be touched by someone who wears them.   PAST MEDICAL HISTORY Past Medical History:  Diagnosis Date   Abdominal wall abscess 10/15/2020   Abdominal wall cellulitis 10/15/2020   Anxiety    Cellulitis 06/04/2021   Cellulitis of abdominal wall 02/14/2022   CHF (congestive heart failure) (HCC)    Diabetes mellitus    Hypercholesteremia    Hypertension    Obesity    Seizures (HCC)    Stroke [facility])    Past Surgical History:  Procedure Laterality Date   ABDOMINAL HYSTERECTOMY     ABDOMINAL SURGERY     CHOLECYSTECTOMY     JOINT REPLACEMENT     FAMILY HISTORY Family History  Adopted: Yes   SOCIAL HISTORY Social History   Tobacco Use   Smoking status: Never   Smokeless tobacco: Never  Vaping Use   Vaping status: Never Used  Substance Use Topics   Alcohol use: No   Drug use: No       OPHTHALMIC EXAM:  Not recorded    IMAGING AND PROCEDURES  Imaging and Procedures for 03/18/2024         ASSESSMENT/PLAN: No diagnosis  found.  1-4. Diabetes mellitus, type 2 without retinopathy  - A1c: 7.4 on 08.20.24 - The incidence, risk factors for progression, natural history and treatment options for diabetic retinopathy were discussed with patient.   - The need for close monitoring of blood glucose, blood pressure, and serum lipids, avoiding cigarette or any type of tobacco, and the need for long term follow up was also discussed with patient. - f/u in 1 year, sooner prn  5,6. Hypertensive retinopathy OU - discussed importance of tight BP  control - monitor  7. Age related macular degeneration, non-exudative, both eyes  - early stage with fine drusen OU  - The incidence, anatomy, and pathology of dry AMD, risk of progression, and the AREDS and AREDS 2 study including smoking risks discussed with patient.  - Recommend amsler grid monitoring  - f/u 4 months, DFE, OCT  8. Pseudophakia OU  - s/p CE/IOL OU (Dr. Anselmo)  - IOL in good position, doing well  - monitor  Ophthalmic Meds Ordered this visit:  No orders of the defined types were placed in this encounter.    No follow-ups on file.  There are no Patient Instructions on file for this visit.  Explained the diagnoses, plan, and follow up with the patient and they expressed understanding.  Patient expressed understanding of the importance of proper follow up care.   This document serves as a record of services personally performed by Redell JUDITHANN Hans, MD, PhD. It was created on their behalf by Wanda GEANNIE Keens, COT an ophthalmic technician. The creation of this record is the provider's dictation and/or activities during the visit.    Electronically signed by:  Wanda GEANNIE Keens, COT  03/04/24 9:14 AM   Redell JUDITHANN Hans, M.D., Ph.D. Diseases & Surgery of the Retina and Vitreous [facility] 03/18/2024   Abbreviations: M myopia (nearsighted); A astigmatism; H hyperopia (farsighted); P presbyopia; Mrx spectacle prescription;  CTL  contact lenses; OD right eye; OS left eye; OU both eyes  XT exotropia; ET esotropia; PEK punctate epithelial keratitis; PEE punctate epithelial erosions; DES dry eye syndrome; MGD meibomian gland dysfunction; ATs artificial tears; PFAT's preservative free artificial tears; NSC nuclear sclerotic cataract; PSC posterior subcapsular cataract; ERM epi-retinal membrane; PVD posterior vitreous detachment; RD retinal detachment; DM diabetes mellitus; DR diabetic retinopathy; NPDR non-proliferative diabetic retinopathy; PDR proliferative diabetic retinopathy; CSME clinically significant macular edema; DME diabetic macular edema; dbh dot blot hemorrhages; CWS cotton wool spot; POAG primary open angle glaucoma; C/D cup-to-disc ratio; HVF humphrey visual field; GVF goldmann visual field; OCT optical coherence tomography; IOP intraocular pressure; BRVO Branch retinal vein occlusion; CRVO central retinal vein occlusion; CRAO central retinal artery occlusion; BRAO branch retinal artery occlusion; RT retinal tear; SB scleral buckle; PPV pars plana vitrectomy; VH Vitreous hemorrhage; PRP panretinal laser photocoagulation; IVK intravitreal kenalog; VMT vitreomacular traction; MH Macular hole;  NVD neovascularization of the disc; NVE neovascularization elsewhere; AREDS age related eye disease study; ARMD age related macular degeneration; POAG primary open angle glaucoma; EBMD epithelial/anterior basement membrane dystrophy; ACIOL anterior chamber intraocular lens; IOL intraocular lens; PCIOL posterior chamber intraocular lens; Phaco/IOL phacoemulsification with intraocular lens placement; PRK photorefractive keratectomy; LASIK laser assisted in situ keratomileusis; HTN hypertension; DM diabetes mellitus; COPD chronic obstructive pulmonary disease

## 2024-03-18 ENCOUNTER — Encounter (INDEPENDENT_AMBULATORY_CARE_PROVIDER_SITE_OTHER): Admitting: Ophthalmology

## 2024-03-18 DIAGNOSIS — Z961 Presence of intraocular lens: Secondary | ICD-10-CM

## 2024-03-18 DIAGNOSIS — Z7985 Long-term (current) use of injectable non-insulin antidiabetic drugs: Secondary | ICD-10-CM

## 2024-03-18 DIAGNOSIS — E119 Type 2 diabetes mellitus without complications: Secondary | ICD-10-CM

## 2024-03-18 DIAGNOSIS — H353131 Nonexudative age-related macular degeneration, bilateral, early dry stage: Secondary | ICD-10-CM

## 2024-03-18 DIAGNOSIS — Z794 Long term (current) use of insulin: Secondary | ICD-10-CM

## 2024-03-18 DIAGNOSIS — Z7984 Long term (current) use of oral hypoglycemic drugs: Secondary | ICD-10-CM

## 2024-03-18 DIAGNOSIS — H35033 Hypertensive retinopathy, bilateral: Secondary | ICD-10-CM

## 2024-03-18 DIAGNOSIS — I10 Essential (primary) hypertension: Secondary | ICD-10-CM

## 2024-03-24 ENCOUNTER — Emergency Department (HOSPITAL_COMMUNITY)
Admission: EM | Admit: 2024-03-24 | Discharge: 2024-03-24 | Disposition: A | Discharge status: Home / Self Care | Attending: Emergency Medicine | Admitting: Emergency Medicine

## 2024-03-24 ENCOUNTER — Other Ambulatory Visit: Payer: Self-pay

## 2024-03-24 ENCOUNTER — Emergency Department (HOSPITAL_COMMUNITY)

## 2024-03-24 DIAGNOSIS — M4802 Spinal stenosis, cervical region: Secondary | ICD-10-CM | POA: Diagnosis not present

## 2024-03-24 DIAGNOSIS — Z6841 Body Mass Index (BMI) 40.0 and over, adult: Secondary | ICD-10-CM | POA: Insufficient documentation

## 2024-03-24 DIAGNOSIS — I509 Heart failure, unspecified: Secondary | ICD-10-CM | POA: Insufficient documentation

## 2024-03-24 DIAGNOSIS — Z91040 Latex allergy status: Secondary | ICD-10-CM | POA: Diagnosis not present

## 2024-03-24 DIAGNOSIS — Z7984 Long term (current) use of oral hypoglycemic drugs: Secondary | ICD-10-CM | POA: Insufficient documentation

## 2024-03-24 DIAGNOSIS — S0990XA Unspecified injury of head, initial encounter: Secondary | ICD-10-CM | POA: Diagnosis present

## 2024-03-24 DIAGNOSIS — M25521 Pain in right elbow: Secondary | ICD-10-CM | POA: Diagnosis present

## 2024-03-24 DIAGNOSIS — Z7985 Long-term (current) use of injectable non-insulin antidiabetic drugs: Secondary | ICD-10-CM | POA: Diagnosis not present

## 2024-03-24 DIAGNOSIS — Z794 Long term (current) use of insulin: Secondary | ICD-10-CM | POA: Insufficient documentation

## 2024-03-24 DIAGNOSIS — E119 Type 2 diabetes mellitus without complications: Secondary | ICD-10-CM | POA: Diagnosis not present

## 2024-03-24 DIAGNOSIS — W07XXXA Fall from chair, initial encounter: Secondary | ICD-10-CM | POA: Insufficient documentation

## 2024-03-24 DIAGNOSIS — J0130 Acute sphenoidal sinusitis, unspecified: Secondary | ICD-10-CM | POA: Diagnosis not present

## 2024-03-24 DIAGNOSIS — M545 Low back pain, unspecified: Secondary | ICD-10-CM | POA: Insufficient documentation

## 2024-03-24 DIAGNOSIS — I110 Hypertensive heart disease with heart failure: Secondary | ICD-10-CM | POA: Diagnosis not present

## 2024-03-24 DIAGNOSIS — E785 Hyperlipidemia, unspecified: Secondary | ICD-10-CM | POA: Insufficient documentation

## 2024-03-24 DIAGNOSIS — W050XXA Fall from non-moving wheelchair, initial encounter: Secondary | ICD-10-CM | POA: Diagnosis not present

## 2024-03-24 DIAGNOSIS — Z8673 Personal history of transient ischemic attack (TIA), and cerebral infarction without residual deficits: Secondary | ICD-10-CM | POA: Diagnosis not present

## 2024-03-24 DIAGNOSIS — Z7901 Long term (current) use of anticoagulants: Secondary | ICD-10-CM | POA: Insufficient documentation

## 2024-03-24 DIAGNOSIS — W19XXXA Unspecified fall, initial encounter: Secondary | ICD-10-CM

## 2024-03-24 DIAGNOSIS — E669 Obesity, unspecified: Secondary | ICD-10-CM | POA: Insufficient documentation

## 2024-03-24 LAB — CBC WITH DIFFERENTIAL/PLATELET
Abs Immature Granulocytes: 0.05 K/uL (ref 0.00–0.07)
Basophils Absolute: 0 K/uL (ref 0.0–0.1)
Basophils Relative: 0 %
Eosinophils Absolute: 0.2 K/uL (ref 0.0–0.5)
Eosinophils Relative: 2 %
HCT: 38.7 % (ref 36.0–46.0)
Hemoglobin: 12.6 g/dL (ref 12.0–15.0)
Immature Granulocytes: 1 %
Lymphocytes Relative: 22 %
Lymphs Abs: 2 K/uL (ref 0.7–4.0)
MCH: 28.6 pg (ref 26.0–34.0)
MCHC: 32.6 g/dL (ref 30.0–36.0)
MCV: 88 fL (ref 80.0–100.0)
Monocytes Absolute: 0.6 K/uL (ref 0.1–1.0)
Monocytes Relative: 7 %
Neutro Abs: 6.4 K/uL (ref 1.7–7.7)
Neutrophils Relative %: 68 %
Platelets: 264 K/uL (ref 150–400)
RBC: 4.4 MIL/uL (ref 3.87–5.11)
RDW: 13.5 % (ref 11.5–15.5)
WBC: 9.3 K/uL (ref 4.0–10.5)
nRBC: 0 % (ref 0.0–0.2)

## 2024-03-24 LAB — I-STAT CHEM 8, ED
BUN: 19 mg/dL (ref 8–23)
Calcium, Ion: 1.22 mmol/L (ref 1.15–1.40)
Chloride: 104 mmol/L (ref 98–111)
Creatinine, Ser: 1 mg/dL (ref 0.44–1.00)
Glucose, Bld: 218 mg/dL — ABNORMAL HIGH (ref 70–99)
HCT: 37 % (ref 36.0–46.0)
Hemoglobin: 12.6 g/dL (ref 12.0–15.0)
Potassium: 3.9 mmol/L (ref 3.5–5.1)
Sodium: 140 mmol/L (ref 135–145)
TCO2: 23 mmol/L (ref 22–32)

## 2024-03-24 LAB — PROTIME-INR
INR: 2.3 — ABNORMAL HIGH (ref 0.8–1.2)
Prothrombin Time: 26.6 s — ABNORMAL HIGH (ref 11.4–15.2)

## 2024-03-24 MED ORDER — LIDOCAINE 5 % EX PTCH
1.0000 | MEDICATED_PATCH | CUTANEOUS | Status: DC
  Administered 2024-03-24: 1 via TRANSDERMAL
  Filled 2024-03-24: qty 1

## 2024-03-24 MED ORDER — HYDROCODONE-ACETAMINOPHEN 5-325 MG PO TABS: 1.0000 | ORAL_TABLET | ORAL | 0 refills | Status: AC | PRN

## 2024-03-24 MED ORDER — DOXYCYCLINE HYCLATE 100 MG PO CAPS
100.0000 mg | ORAL_CAPSULE | Freq: Two times a day (BID) | ORAL | 0 refills | Status: AC
Start: 2024-03-24 — End: ?

## 2024-03-24 MED ORDER — MORPHINE SULFATE (PF) 4 MG/ML IV SOLN
4.0000 mg | Freq: Once | INTRAVENOUS | Status: AC
Start: 2024-03-24 — End: 2024-03-24
  Administered 2024-03-24: 4 mg via INTRAMUSCULAR
  Filled 2024-03-24: qty 1

## 2024-03-24 MED ORDER — FLUTICASONE PROPIONATE 50 MCG/ACT NA SUSP: 1.0000 | Freq: Every day | NASAL | 0 refills | Status: AC

## 2024-03-24 NOTE — Progress Notes (Signed)
 Orthopedic Tech Progress Note Patient Details:  Donna Snow [birth year] [id number] Level 2 Trauma. Not needed Patient ID: Kecia Swoboda, female   DOB: 1951-05-02, 73 y.o.   MRN: [id number]  Efrain DELENA Cos 03/24/2024, 1:25 PM

## 2024-03-24 NOTE — ED Provider Notes (Signed)
  EMERGENCY DEPARTMENT AT [facility] Provider Note   CSN: [id number] Arrival date & time: 03/24/24  1318     Patient presents with: Donna Snow is a 73 y.o. female.   Pt is a 73 yo with pmhx significant for dm, htn, hld, obesity, chf, and cva.  Pt presents to the ED today as a level 2 trauma.  Pt's daughter was pushing her in her wheelchair when she hit a bump and she fell backwards out of her wheelchair.  She did hit her head.  No loc.  She has back pain, but always has back pain.  She said her pain clinic no longer takes her insurance, so she is in between providers.  She also has pain to her right elbow.         Prior to Admission medications   Medication Sig Start Date End Date Taking? Authorizing Provider  doxycycline  (VIBRAMYCIN ) 100 MG capsule Take 1 capsule (100 mg total) by mouth 2 (two) times daily. 03/24/24  Yes Dean Clarity, MD  fluticasone  [facility]) 50 MCG/ACT nasal spray Place 1 spray into both nostrils daily. 03/24/24  Yes Dean Clarity, MD  HYDROcodone -acetaminophen  (NORCO/VICODIN) 5-325 MG tablet Take 1 tablet by mouth every 4 (four) hours as needed. 03/24/24  Yes Dean Clarity, MD  acetaminophen  (TYLENOL ) 500 MG tablet Take 2 tablets (1,000 mg total) by mouth every 6 (six) hours. 05/29/23   Celestina Czar, MD  albuterol  (PROVENTIL ) (2.5 MG/3ML) 0.083% nebulizer solution Inhale 3 mLs (2.5 mg total) into the lungs every 4 (four) hours as needed for shortness of breath. 05/12/22   Danford, Lonni SQUIBB, MD  albuterol  (VENTOLIN  HFA) 108 (90 Base) MCG/ACT inhaler Inhale 2 puffs into the lungs every 4 (four) hours as needed for wheezing or shortness of breath. 09/20/22   Cleotilde Rogue, MD  bisacodyl  (FLEET) 10 MG/30ML ENEM Place 10 mg rectally daily.    [provider]  busPIRone  (BUSPAR ) 10 MG tablet Take 20 mg by mouth 3 (three) times daily. 08/16/20   [provider]  colchicine  0.6 MG tablet Take 1 tablet (0.6 mg total)  by mouth 2 (two) times daily for 14 days. 05/29/23 06/12/23  Celestina Czar, MD  cyclobenzaprine  (FLEXERIL ) 10 MG tablet Take 10 mg by mouth 3 (three) times daily as needed. 11/29/23   [provider]  diclofenac  Sodium (VOLTAREN ) 1 % GEL Apply 4 g topically 4 (four) times daily. 05/29/23   Celestina Czar, MD  donepezil (ARICEPT) 5 MG tablet Take 5 mg by mouth daily. 05/10/23   [provider]  ezetimibe  (ZETIA ) 10 MG tablet Take 10 mg by mouth daily. 07/25/18   [provider]  FLUoxetine  (PROZAC ) 20 MG capsule Take 20 mg by mouth daily. With 40mg  for total of 60mg     [provider]  FLUoxetine  (PROZAC ) 40 MG capsule Take 40 mg by mouth daily. With 20mg  for total of 60mg     [provider]  fluticasone  furoate-vilanterol (BREO ELLIPTA ) 100-25 MCG/INH AEPB Inhale 1 puff into the lungs daily as needed (for shortness of breath). Patient not taking: Reported on 05/13/2023 03/08/20   [provider]  folic acid (FOLVITE) 1 MG tablet Take 1 mg by mouth every morning. 02/27/24   [provider]  furosemide  (LASIX ) 20 MG tablet Take 3 tablets (60 mg total) by mouth daily. 08/15/22   Marcine Catalan M, PA-C  hydrOXYzine  (ATARAX ) 50 MG tablet Take 50 mg by mouth 2 (two) times daily.  [provider]  hyoscyamine  (LEVSIN  SL) 0.125 MG SL tablet Take 0.125 mg by mouth daily as needed for cramping. 05/10/23   [provider]  insulin  aspart (NOVOLOG ) 100 UNIT/ML injection Inject 0-20 Units into the skin 3 (three) times daily with meals. This is a sliding scale in addition to scheduled Novolog  dose.  CBG < 70: Implement Hypoglycemia Standing Orders and refer to Hypoglycemia Standing Orders sidebar report CBG 70 - 120: 0 units CBG 121 - 150: 3 units CBG 151 - 200: 4 units CBG 201 - 250: 7 units CBG 251 - 300: 11 units CBG 301 - 350: 15 units CBG 351 - 400: 20 units 05/29/23   Celestina Czar, MD  insulin  glargine-yfgn (SEMGLEE )  100 UNIT/ML injection Inject 0.32 mLs (32 Units total) into the skin 2 (two) times daily. 05/29/23   Celestina Czar, MD  lidocaine  (LIDODERM ) 5 % Place 1 patch onto the skin 2 (two) times daily. Remove & Discard patch within 12 hours or as directed by MD 05/29/23   Celestina Czar, MD  loratadine  (CLARITIN ) 10 MG tablet Take 10 mg by mouth daily.    [provider]  losartan  (COZAAR ) 50 MG tablet Take 50 mg by mouth daily.    [provider]  metFORMIN (GLUCOPHAGE) 500 MG tablet Take 500 mg by mouth 2 (two) times daily with a meal.    [provider]  nystatin  (MYCOSTATIN /NYSTOP ) powder Apply 1 Application topically 2 (two) times daily as needed (Irritation). 02/08/24   [provider]  omeprazole (PRILOSEC) 40 MG capsule Take 40 mg by mouth daily. 06/05/20   [provider]  OZEMPIC , 0.25 OR 0.5 MG/DOSE, 2 MG/3ML SOPN Inject 0.5 mg into the skin once a week. Wednesday 12/05/21   [provider]  phenytoin  (DILANTIN ) 100 MG ER capsule Take 400 mg by mouth daily.    [provider]  polyethylene glycol powder (GLYCOLAX /MIRALAX ) 17 GM/SCOOP powder Take 1 Container by mouth once. Takes 1 scoop per day    [provider]  prazosin  (MINIPRESS ) 1 MG capsule Take 1 mg by mouth in the morning. Take 1mg  (1 tab) in AM. Take 2mg  (2 tab) at bedtime (per patient statement).    [provider]  rosuvastatin  (CRESTOR ) 40 MG tablet Take 1 tablet by mouth daily. 12/22/18   [provider]  spironolactone  (ALDACTONE ) 25 MG tablet Take 0.5 tablets (12.5 mg total) by mouth daily. 08/15/22   Marcine Catalan M, PA-C  tiZANidine  (ZANAFLEX ) 4 MG capsule Take 4 mg by mouth 3 (three) times daily.    [provider]  topiramate  (TOPAMAX ) 100 MG tablet Take 200 mg by mouth 2 (two) times daily. 06/06/20   [provider]  topiramate  (TOPAMAX ) 200 MG tablet Take 200 mg by mouth 2 (two) times daily. 02/27/24   [provider]  trazodone  (DESYREL ) 300 MG tablet Take 300 mg by mouth at bedtime as needed for sleep.    [provider]  Vitamin D, Ergocalciferol, (DRISDOL) 1.25 MG (50000 UNIT) CAPS capsule Take 50,000 Units by mouth once a week. 02/27/24   [provider]  warfarin (COUMADIN ) 3 MG tablet Take 1 tablet (3 mg total) by mouth daily at 4 PM. 05/29/23   Koomson, Julius, MD  warfarin (COUMADIN ) 5 MG tablet Take by mouth. 02/27/24   [provider]  atorvastatin (LIPITOR) 40 MG tablet Take 40 mg by mouth daily.  10/21/20  [provider]  citalopram (CELEXA) 20 MG tablet Take  by mouth. Patient not taking: Reported on 10/15/2020  10/21/20  [provider]  dicyclomine (BENTYL) 10 MG capsule Take by mouth. Patient not taking: Reported on 10/15/2020  10/21/20  [provider]  fexofenadine (ALLEGRA) 180 MG tablet Take 180 mg by mouth daily.    11/27/19  [provider]  glipiZIDE (GLUCOTROL) 10 MG tablet Take 10 mg by mouth 2 (two) times daily before a meal.    11/27/19  [provider]  lisinopril-hydrochlorothiazide (PRINZIDE,ZESTORETIC) 10-12.5 MG per tablet Take 1 tablet by mouth daily.   Patient not taking: No sig reported  10/21/20  [provider]  sitaGLIPtan-metformin (JANUMET) 50-1000 MG per tablet Take 1 tablet by mouth 2 (two) times daily with a meal.    11/27/19  [provider]  zolpidem (AMBIEN) 10 MG tablet Take 10 mg by mouth at bedtime as needed.    11/27/19  [provider]    Allergies: Latex    Review of Systems  Musculoskeletal:  Positive for back pain.       Right elbow pain   Neurological:  Positive for headaches.  All other systems reviewed and are negative.   Updated Vital Signs BP 138/73 (BP Location: Left Arm)   Pulse 87   Temp 98.7 F (37.1 C) (Oral)   Resp 20   Ht 5' 4 (1.626 m)   Wt 136.1 kg   SpO2 96%   BMI 51.49 kg/m   Physical Exam Vitals and nursing note reviewed.   Constitutional:      Appearance: Normal appearance. She is obese.  HENT:     Head: Normocephalic and atraumatic.     Right Ear: External ear normal.     Left Ear: External ear normal.     Nose: Nose normal.     Mouth/Throat:     Mouth: Mucous membranes are moist.     Pharynx: Oropharynx is clear.  Eyes:     Extraocular Movements: Extraocular movements intact.     Conjunctiva/sclera: Conjunctivae normal.     Pupils: Pupils are equal, round, and reactive to light.  Cardiovascular:     Rate and Rhythm: Normal rate and regular rhythm.     Pulses: Normal pulses.     Heart sounds: Normal heart sounds.  Pulmonary:     Effort: Pulmonary effort is normal.     Breath sounds: Normal breath sounds.  Abdominal:     General: Abdomen is flat. Bowel sounds are normal.     Palpations: Abdomen is soft.  Musculoskeletal:     Cervical back: Normal range of motion and neck supple.     Comments: Right elbow tenderness; full rom.  No deformity.  Lower back tenderness.  No bruising.  Skin:    General: Skin is warm.     Capillary Refill: Capillary refill takes less than 2 seconds.  Neurological:     General: No focal deficit present.     Mental Status: She is alert and oriented to person, place, and time.  Psychiatric:        Mood and Affect: Mood normal.        Behavior: Behavior normal.     (all labs ordered are listed, but only abnormal results are displayed) Labs Reviewed  PROTIME-INR - Abnormal; Notable for the following components:      Result Value   Prothrombin Time 26.6 (*)    INR 2.3 (*)    All other components within normal limits  I-STAT CHEM 8, ED - Abnormal;  Notable for the following components:   Glucose, Bld 218 (*)    All other components within normal limits  CBC WITH DIFFERENTIAL/PLATELET    EKG: None  Radiology: DG Elbow Complete Right Result Date: 03/24/2024 CLINICAL DATA:  Fall.  Pain. EXAM: RIGHT ELBOW - COMPLETE 3+ VIEW COMPARISON:  None Available. FINDINGS:  No acute osseous or joint abnormality. There may be minimal soft tissue swelling over the olecranon. IMPRESSION: No acute osseous or joint abnormality. Electronically Signed   By: Newell Eke M.D.   On: 03/24/2024 15:23   DG Lumbar Spine Complete Result Date: 03/24/2024 CLINICAL DATA:  Back pain. EXAM: LUMBAR SPINE - COMPLETE 4+ VIEW COMPARISON:  Lumbar spine radiograph dated 04/10/2020. FINDINGS: Five lumbar type vertebra. There is no acute fracture or subluxation of the lumbar spine. The bones are osteopenic. Multilevel degenerative changes with endplate irregularity and spurring. Grade 1 L4-L5 anterolisthesis. Multilevel facet arthropathy. The soft tissues unremarkable. Right upper quadrant surgical clips. IMPRESSION: 1. No acute findings. 2. Multilevel degenerative changes. Electronically Signed   By: Vanetta Chou M.D.   On: 03/24/2024 15:23   CT Cervical Spine Wo Contrast Result Date: 03/24/2024 EXAM: CT CERVICAL SPINE WITHOUT CONTRAST 03/24/2024 97:91:75 PM TECHNIQUE: CT of the cervical spine was performed without the administration of intravenous contrast. Multiplanar reformatted images are provided for review. Automated exposure control, iterative reconstruction, and/or weight based adjustment of the mA/kV was utilized to reduce the radiation dose to as low as reasonably achievable. COMPARISON: Cervical spine radiograph 03/07/2015. CLINICAL HISTORY: Polytrauma, blunt. FINDINGS: CERVICAL SPINE: BONES AND ALIGNMENT: Straightening of the normal cervical lordosis. No acute fracture. Evaluation of fine osseous detail is limited due to artifact. DEGENERATIVE CHANGES: Intervertebral disc space narrowing throughout the cervical spine. There are small degenerative endplate osteophytes at multiple levels. Disc osteophyte complexes at multiple levels. There is at least mild osseous spinal canal stenosis at C4-C5. Disc osteophyte complex at C5-C6 eccentric to the left resulting in at least mild to  moderate narrowing of the osseous spinal canal. Facet arthrosis and uncovertebral hypertrophy at multiple levels. Foraminal stenosis greatest on the left at C4-C5 and C5-C6. SOFT TISSUES: No prevertebral soft tissue swelling. INCIDENTAL FINDINGS: Right sided aortic arch noted. IMPRESSION: 1. No acute abnormality of the cervical spine. 2. Degenerative changes as above. At least mild osseous spinal canal stenosis at C4-5. At least mild to moderate osseous spinal canal stenosis at C5-6 due to a left-eccentric disc osteophyte complex. Electronically signed by: Donnice Mania MD 03/24/2024 02:50 PM EST RP Workstation: [id number]   DG Pelvis Portable Result Date: 03/24/2024 EXAM: 1 or 2 VIEW(S) XRAY OF THE PELVIS 03/24/2024 01:39:00 PM COMPARISON: CTA abdomen and pelvis 02/13/2022. CLINICAL HISTORY: fall FINDINGS: BONES AND JOINTS: Degenerative changes of visualized lower LUMBAR spine. No acute fracture. No focal osseous lesion. No joint dislocation. SOFT TISSUES: Pelvic phleboliths. IMPRESSION: 1. No evidence of acute traumatic injury. 2. Degenerative changes of the visualized lower lumbar spine. Electronically signed by: Donnice Mania MD 03/24/2024 02:38 PM EST RP Workstation: [id number]   DG Chest Portable 1 View Result Date: 03/24/2024 EXAM: 1 VIEW(S) XRAY OF THE CHEST 03/24/2024 01:39:00 PM COMPARISON: 09/20/2022 CLINICAL HISTORY: fall FINDINGS: LUNGS AND PLEURA: No focal pulmonary opacity. No pulmonary edema. No pleural effusion. No pneumothorax. HEART AND MEDIASTINUM: Similar appearance of cardiomegaly. Right aortic arch. BONES AND SOFT TISSUES: No acute osseous abnormality. IMPRESSION: 1. No acute cardiopulmonary process. Electronically signed by: Donnice Mania MD 03/24/2024 02:37 PM EST RP Workstation: [id number]   CT Head Wo  Contrast Result Date: 03/24/2024 EXAM: CT HEAD WITHOUT CONTRAST 03/24/2024 97:91:75 PM TECHNIQUE: CT of the head was performed without the administration of intravenous contrast.  Automated exposure control, iterative reconstruction, and/or weight based adjustment of the mA/kV was utilized to reduce the radiation dose to as low as reasonably achievable. COMPARISON: CT head 10/15/2020. CLINICAL HISTORY: Polytrauma, blunt. FINDINGS: BRAIN AND VENTRICLES: No acute hemorrhage. No evidence of acute infarct. No hydrocephalus. No extra-axial collection. No mass effect or midline shift. Mild chronic microvascular ischemic changes. Atherosclerosis of the carotid siphons and intracranial vertebral arteries. ORBITS: Bilateral lens replacement. SINUSES: Opacification of the right sphenoid sinus with thickening of the sinus wall suggestive of mucoperiosteal reaction. Abnormal soft tissue extends into the right sphenoethmoidal recess and posterior ethmoid air cells. SOFT TISSUES AND SKULL: No acute soft tissue abnormality. No skull fracture. IMPRESSION: 1. No acute intracranial abnormality. 2. Chronic right sphenoid sinusitis, increased since 2022. Soft tissue extending into the right spheno-ethmoidal recess and posterior ethmoid air cells. 3. Mild chronic microvascular ischemic changes. Electronically signed by: Donnice Mania MD 03/24/2024 02:17 PM EST RP Workstation: [id number]     Procedures   Medications Ordered in the ED  lidocaine  (LIDODERM ) 5 % 1 patch (1 patch Transdermal Patch Applied 03/24/24 1512)  morphine  (PF) 4 MG/ML injection 4 mg (4 mg Intramuscular Given 03/24/24 1512)                                    Medical Decision Making Amount and/or Complexity of Data Reviewed Labs: ordered. Radiology: ordered.  Risk Prescription drug management.   This patient presents to the ED for concern of fall, this involves an extensive number of treatment options, and is a complaint that carries with it a high risk of complications and morbidity.  The differential diagnosis includes multiple trauma   Co morbidities that complicate the patient evaluation  dm, htn, hld, obesity,  chf, and cva   Additional history obtained:  Additional history obtained from epic chart review External records from outside source obtained and reviewed including EMS report   Lab Tests:  I Ordered, and personally interpreted labs.  The pertinent results include:  istat neg other than glucose elevated at 218   Imaging Studies ordered:  I ordered imaging studies including ct head, ct c spine, cxr, pelvis, r elbow, lumbar  I independently visualized and interpreted imaging which showed  CT head: No acute intracranial abnormality.  2. Chronic right sphenoid sinusitis, increased since 2022. Soft tissue  extending into the right spheno-ethmoidal recess and posterior ethmoid air  cells.  3. Mild chronic microvascular ischemic changes.  CXR: No acute cardiopulmonary process.  Pelvis: No evidence of acute traumatic injury.  2. Degenerative changes of the visualized lower lumbar spine.  CT cervical: No acute abnormality of the cervical spine.  2. Degenerative changes as above. At least mild osseous spinal canal stenosis  at C4-5. At least mild to moderate osseous spinal canal stenosis at C5-6 due to  a left-eccentric disc osteophyte complex.  R elbow: No acute osseous or joint abnormality.  Lumbar: No acute findings.  2. Multilevel degenerative changes.   I agree with the radiologist interpretation   Medicines ordered and prescription drug management:  I ordered medication including morphine /lidocaine  patch  for pain  Reevaluation of the patient after these medicines showed that the patient improved I have reviewed the patients home medicines and have made adjustments as needed  Test Considered:  ct   Critical Interventions:  Level 2 trauma   Problem List / ED Course:  Fall:  no internal injury.  Pt is stable for d/c.  Return if worse. Sinusitis:  chronic, but pt c/o right ear and sinus pain.  D/c with doxy and flonase.     Reevaluation:  After the  interventions noted above, I reevaluated the patient and found that they have :improved   Social Determinants of Health:  Lives at home   Dispostion:  After consideration of the diagnostic results and the patients response to treatment, I feel that the patent would benefit from discharge with outpatient f/u.       Final diagnoses:  Fall, initial encounter  Anticoagulated on Coumadin   Acute non-recurrent sphenoidal sinusitis    ED Discharge Orders          Ordered    HYDROcodone -acetaminophen  (NORCO/VICODIN) 5-325 MG tablet  Every 4 hours PRN        03/24/24 1535    doxycycline  (VIBRAMYCIN ) 100 MG capsule  2 times daily        03/24/24 1535    fluticasone  (FLONASE) 50 MCG/ACT nasal spray  Daily        03/24/24 1535               Dean Clarity, MD 03/24/24 1537

## 2024-03-24 NOTE — ED Notes (Signed)
 Pt able to ambulate 25 ft w/ walker independently.

## 2024-03-24 NOTE — ED Triage Notes (Signed)
 Pt BIB EMS daughter was pushing her in wheelchair on sidewalk and hit a bump, they both went backwards, pt hit back of head on concrete and back on bar of wheelchair. Pt takes warfarin. AOX4.

## 2024-03-25 NOTE — Progress Notes (Shared)
 [facility] - Clinic Note  03/27/2024   CHIEF COMPLAINT Patient presents for No chief complaint on file.  HISTORY OF PRESENT ILLNESS: Donna Snow is a 73 y.o. female who presents to the clinic today for:   Pt is here on the referral of her PCP for DM exam, she states her PCP found diabetes in one of her eyes, but she doesn't know which one, she states she used to see Dr. Roz, but hasn't seen anyone since he retired, she has an appt with [facility] to get new glasses   Referring physician: [facility] [address] [place],  [place] [address]  HISTORICAL INFORMATION:  Selected notes from the MEDICAL RECORD NUMBER Referred by Luke Miyamoto, NP for a diabetic eye exam LEE:  Ocular Hx- PMH-   CURRENT MEDICATIONS: No current outpatient medications on file. (Ophthalmic Drugs)   No current facility-administered medications for this visit. (Ophthalmic Drugs)   Current Outpatient Medications (Other)  Medication Sig   acetaminophen  (TYLENOL ) 500 MG tablet Take 2 tablets (1,000 mg total) by mouth every 6 (six) hours.   albuterol  (PROVENTIL ) (2.5 MG/3ML) 0.083% nebulizer solution Inhale 3 mLs (2.5 mg total) into the lungs every 4 (four) hours as needed for shortness of breath.   albuterol  (VENTOLIN  HFA) 108 (90 Base) MCG/ACT inhaler Inhale 2 puffs into the lungs every 4 (four) hours as needed for wheezing or shortness of breath.   bisacodyl  (FLEET) 10 MG/30ML ENEM Place 10 mg rectally daily.   busPIRone  (BUSPAR ) 10 MG tablet Take 20 mg by mouth 3 (three) times daily.   colchicine  0.6 MG tablet Take 1 tablet (0.6 mg total) by mouth 2 (two) times daily for 14 days.   cyclobenzaprine  (FLEXERIL ) 10 MG tablet Take 10 mg by mouth 3 (three) times daily as needed.   diclofenac  Sodium (VOLTAREN ) 1 % GEL Apply 4 g topically 4 (four) times daily.   donepezil (ARICEPT) 5 MG tablet Take 5 mg by mouth daily.   doxycycline  (VIBRAMYCIN ) 100 MG  capsule Take 1 capsule (100 mg total) by mouth 2 (two) times daily.   ezetimibe  (ZETIA ) 10 MG tablet Take 10 mg by mouth daily.   FLUoxetine  (PROZAC ) 20 MG capsule Take 20 mg by mouth daily. With 40mg  for total of 60mg    FLUoxetine  (PROZAC ) 40 MG capsule Take 40 mg by mouth daily. With 20mg  for total of 60mg    fluticasone  (FLONASE) 50 MCG/ACT nasal spray Place 1 spray into both nostrils daily.   fluticasone  furoate-vilanterol (BREO ELLIPTA ) 100-25 MCG/INH AEPB Inhale 1 puff into the lungs daily as needed (for shortness of breath). (Patient not taking: Reported on 05/13/2023)   folic acid (FOLVITE) 1 MG tablet Take 1 mg by mouth every morning.   furosemide  (LASIX ) 20 MG tablet Take 3 tablets (60 mg total) by mouth daily.   HYDROcodone -acetaminophen  (NORCO/VICODIN) 5-325 MG tablet Take 1 tablet by mouth every 4 (four) hours as needed.   hydrOXYzine  (ATARAX ) 50 MG tablet Take 50 mg by mouth 2 (two) times daily.   hyoscyamine  (LEVSIN  SL) 0.125 MG SL tablet Take 0.125 mg by mouth daily as needed for cramping.   insulin  aspart (NOVOLOG ) 100 UNIT/ML injection Inject 0-20 Units into the skin 3 (three) times daily with meals. This is a sliding scale in addition to scheduled Novolog  dose.  CBG < 70: Implement Hypoglycemia Standing Orders and refer to Hypoglycemia Standing Orders sidebar report CBG 70 - 120: 0 units CBG 121 - 150:  3 units CBG 151 - 200: 4 units CBG 201 - 250: 7 units CBG 251 - 300: 11 units CBG 301 - 350: 15 units CBG 351 - 400: 20 units   insulin  glargine-yfgn (SEMGLEE ) 100 UNIT/ML injection Inject 0.32 mLs (32 Units total) into the skin 2 (two) times daily.   lidocaine  (LIDODERM ) 5 % Place 1 patch onto the skin 2 (two) times daily. Remove & Discard patch within 12 hours or as directed by MD   loratadine  (CLARITIN ) 10 MG tablet Take 10 mg by mouth daily.   [Paused] losartan  (COZAAR ) 50 MG tablet Take 50 mg by mouth daily.   metFORMIN (GLUCOPHAGE) 500 MG tablet Take 500 mg by mouth 2  (two) times daily with a meal.   nystatin  (MYCOSTATIN /NYSTOP ) powder Apply 1 Application topically 2 (two) times daily as needed (Irritation).   omeprazole (PRILOSEC) 40 MG capsule Take 40 mg by mouth daily.   OZEMPIC , 0.25 OR 0.5 MG/DOSE, 2 MG/3ML SOPN Inject 0.5 mg into the skin once a week. Wednesday   phenytoin  (DILANTIN ) 100 MG ER capsule Take 400 mg by mouth daily.   polyethylene glycol powder (GLYCOLAX /MIRALAX ) 17 GM/SCOOP powder Take 1 Container by mouth once. Takes 1 scoop per day   prazosin  (MINIPRESS ) 1 MG capsule Take 1 mg by mouth in the morning. Take 1mg  (1 tab) in AM. Take 2mg  (2 tab) at bedtime (per patient statement).   rosuvastatin  (CRESTOR ) 40 MG tablet Take 1 tablet by mouth daily.   [Paused] spironolactone  (ALDACTONE ) 25 MG tablet Take 0.5 tablets (12.5 mg total) by mouth daily.   tiZANidine  (ZANAFLEX ) 4 MG capsule Take 4 mg by mouth 3 (three) times daily.   topiramate  (TOPAMAX ) 100 MG tablet Take 200 mg by mouth 2 (two) times daily.   topiramate  (TOPAMAX ) 200 MG tablet Take 200 mg by mouth 2 (two) times daily.   trazodone  (DESYREL ) 300 MG tablet Take 300 mg by mouth at bedtime as needed for sleep.   Vitamin D, Ergocalciferol, (DRISDOL) 1.25 MG (50000 UNIT) CAPS capsule Take 50,000 Units by mouth once a week.   warfarin (COUMADIN ) 3 MG tablet Take 1 tablet (3 mg total) by mouth daily at 4 PM.   warfarin (COUMADIN ) 5 MG tablet Take by mouth.   No current facility-administered medications for this visit. (Other)   REVIEW OF SYSTEMS:   ALLERGIES Allergies  Allergen Reactions   Latex Other (See Comments)    Welts - cant wear them, but can be touched by someone who wears them.   PAST MEDICAL HISTORY Past Medical History:  Diagnosis Date   Abdominal wall abscess 10/15/2020   Abdominal wall cellulitis 10/15/2020   Anxiety    Cellulitis 06/04/2021   Cellulitis of abdominal wall 02/14/2022   CHF (congestive heart failure) (HCC)    Diabetes mellitus     Hypercholesteremia    Hypertension    Obesity    Seizures (HCC)    Stroke [facility])    Past Surgical History:  Procedure Laterality Date   ABDOMINAL HYSTERECTOMY     ABDOMINAL SURGERY     CHOLECYSTECTOMY     JOINT REPLACEMENT     FAMILY HISTORY Family History  Adopted: Yes   SOCIAL HISTORY Social History   Tobacco Use   Smoking status: Never   Smokeless tobacco: Never  Vaping Use   Vaping status: Never Used  Substance Use Topics   Alcohol use: No   Drug use: No       OPHTHALMIC EXAM:  Not recorded  IMAGING AND PROCEDURES  Imaging and Procedures for 03/27/2024         ASSESSMENT/PLAN:   ICD-10-CM   1. Diabetes mellitus type 2 without retinopathy (HCC)  E11.9     2. Current use of insulin  (HCC)  Z79.4     3. Long term (current) use of oral hypoglycemic drugs  Z79.84     4. Long-term (current) use of injectable non-insulin  antidiabetic drugs  Z79.85     5. Essential hypertension  I10     6. Hypertensive retinopathy of both eyes  H35.033     7. Early dry stage nonexudative age-related macular degeneration of both eyes  H35.3131     8. Pseudophakia, both eyes  Z96.1       1-4. Diabetes mellitus, type 2 without retinopathy  - A1c: 7.4 on 08.20.24 - The incidence, risk factors for progression, natural history and treatment options for diabetic retinopathy were discussed with patient.   - The need for close monitoring of blood glucose, blood pressure, and serum lipids, avoiding cigarette or any type of tobacco, and the need for long term follow up was also discussed with patient. - f/u in 1 year, sooner prn  5,6. Hypertensive retinopathy OU - discussed importance of tight BP control - monitor  7. Age related macular degeneration, non-exudative, both eyes  - early stage with fine drusen OU  - The incidence, anatomy, and pathology of dry AMD, risk of progression, and the AREDS and AREDS 2 study including smoking risks discussed with patient.  -  Recommend amsler grid monitoring  - f/u 4 months DFE, OCT  8. Pseudophakia OU  - s/p CE/IOL OU (Dr. Anselmo)  - IOL in good position, doing well  - monitor  Ophthalmic Meds Ordered this visit:  No orders of the defined types were placed in this encounter.    No follow-ups on file.  There are no Patient Instructions on file for this visit.  Explained the diagnoses, plan, and follow up with the patient and they expressed understanding.  Patient expressed understanding of the importance of proper follow up care.   This document serves as a record of services personally performed by Redell JUDITHANN Hans, MD, PhD. It was created on their behalf by Auston Muzzy, COMT. The creation of this record is the provider's dictation and/or activities during the visit.  Electronically signed by: Auston Muzzy, COMT 03/25/24 10:47 AM    Redell JUDITHANN Hans, M.D., Ph.D. Diseases & Surgery of the Retina and Vitreous [facility]    Abbreviations: M myopia (nearsighted); A astigmatism; H hyperopia (farsighted); P presbyopia; Mrx spectacle prescription;  CTL contact lenses; OD right eye; OS left eye; OU both eyes  XT exotropia; ET esotropia; PEK punctate epithelial keratitis; PEE punctate epithelial erosions; DES dry eye syndrome; MGD meibomian gland dysfunction; ATs artificial tears; PFAT's preservative free artificial tears; NSC nuclear sclerotic cataract; PSC posterior subcapsular cataract; ERM epi-retinal membrane; PVD posterior vitreous detachment; RD retinal detachment; DM diabetes mellitus; DR diabetic retinopathy; NPDR non-proliferative diabetic retinopathy; PDR proliferative diabetic retinopathy; CSME clinically significant macular edema; DME diabetic macular edema; dbh dot blot hemorrhages; CWS cotton wool spot; POAG primary open angle glaucoma; C/D cup-to-disc ratio; HVF humphrey visual field; GVF goldmann visual field; OCT optical coherence tomography; IOP intraocular pressure; BRVO  Branch retinal vein occlusion; CRVO central retinal vein occlusion; CRAO central retinal artery occlusion; BRAO branch retinal artery occlusion; RT retinal tear; SB scleral buckle; PPV pars plana vitrectomy; VH Vitreous hemorrhage; PRP  panretinal laser photocoagulation; IVK intravitreal kenalog; VMT vitreomacular traction; MH Macular hole;  NVD neovascularization of the disc; NVE neovascularization elsewhere; AREDS age related eye disease study; ARMD age related macular degeneration; POAG primary open angle glaucoma; EBMD epithelial/anterior basement membrane dystrophy; ACIOL anterior chamber intraocular lens; IOL intraocular lens; PCIOL posterior chamber intraocular lens; Phaco/IOL phacoemulsification with intraocular lens placement; PRK photorefractive keratectomy; LASIK laser assisted in situ keratomileusis; HTN hypertension; DM diabetes mellitus; COPD chronic obstructive pulmonary disease

## 2024-03-27 ENCOUNTER — Encounter (INDEPENDENT_AMBULATORY_CARE_PROVIDER_SITE_OTHER): Admitting: Ophthalmology

## 2024-03-27 DIAGNOSIS — I10 Essential (primary) hypertension: Secondary | ICD-10-CM

## 2024-03-27 DIAGNOSIS — Z961 Presence of intraocular lens: Secondary | ICD-10-CM

## 2024-03-27 DIAGNOSIS — H353131 Nonexudative age-related macular degeneration, bilateral, early dry stage: Secondary | ICD-10-CM

## 2024-03-27 DIAGNOSIS — Z7985 Long-term (current) use of injectable non-insulin antidiabetic drugs: Secondary | ICD-10-CM

## 2024-03-27 DIAGNOSIS — E119 Type 2 diabetes mellitus without complications: Secondary | ICD-10-CM

## 2024-03-27 DIAGNOSIS — H35033 Hypertensive retinopathy, bilateral: Secondary | ICD-10-CM

## 2024-03-27 DIAGNOSIS — Z7984 Long term (current) use of oral hypoglycemic drugs: Secondary | ICD-10-CM

## 2024-03-27 DIAGNOSIS — Z794 Long term (current) use of insulin: Secondary | ICD-10-CM

## 2024-04-01 NOTE — Progress Notes (Shared)
 [facility] - Clinic Note  04/15/2024   CHIEF COMPLAINT Patient presents for No chief complaint on file.  HISTORY OF PRESENT ILLNESS: Donna Snow is a 73 y.o. female who presents to the clinic today for:   Pt is here on the referral of her PCP for DM exam, she states her PCP found diabetes in one of her eyes, but she doesn't know which one, she states she used to see Dr. Roz, but hasn't seen anyone since he retired, she has an appt with [facility] to get new glasses   Referring physician: [facility] [address] [place],  [place] [address]  HISTORICAL INFORMATION:  Selected notes from the MEDICAL RECORD NUMBER Referred by Luke Miyamoto, NP for a diabetic eye exam LEE:  Ocular Hx- PMH-   CURRENT MEDICATIONS: No current outpatient medications on file. (Ophthalmic Drugs)   No current facility-administered medications for this visit. (Ophthalmic Drugs)   Current Outpatient Medications (Other)  Medication Sig   acetaminophen  (TYLENOL ) 500 MG tablet Take 2 tablets (1,000 mg total) by mouth every 6 (six) hours.   albuterol  (PROVENTIL ) (2.5 MG/3ML) 0.083% nebulizer solution Inhale 3 mLs (2.5 mg total) into the lungs every 4 (four) hours as needed for shortness of breath.   albuterol  (VENTOLIN  HFA) 108 (90 Base) MCG/ACT inhaler Inhale 2 puffs into the lungs every 4 (four) hours as needed for wheezing or shortness of breath.   bisacodyl  (FLEET) 10 MG/30ML ENEM Place 10 mg rectally daily.   busPIRone  (BUSPAR ) 10 MG tablet Take 20 mg by mouth 3 (three) times daily.   colchicine  0.6 MG tablet Take 1 tablet (0.6 mg total) by mouth 2 (two) times daily for 14 days.   cyclobenzaprine  (FLEXERIL ) 10 MG tablet Take 10 mg by mouth 3 (three) times daily as needed.   diclofenac  Sodium (VOLTAREN ) 1 % GEL Apply 4 g topically 4 (four) times daily.   donepezil (ARICEPT) 5 MG tablet Take 5 mg by mouth daily.   doxycycline  (VIBRAMYCIN ) 100 MG  capsule Take 1 capsule (100 mg total) by mouth 2 (two) times daily.   ezetimibe  (ZETIA ) 10 MG tablet Take 10 mg by mouth daily.   FLUoxetine  (PROZAC ) 20 MG capsule Take 20 mg by mouth daily. With 40mg  for total of 60mg    FLUoxetine  (PROZAC ) 40 MG capsule Take 40 mg by mouth daily. With 20mg  for total of 60mg    fluticasone  (FLONASE) 50 MCG/ACT nasal spray Place 1 spray into both nostrils daily.   fluticasone  furoate-vilanterol (BREO ELLIPTA ) 100-25 MCG/INH AEPB Inhale 1 puff into the lungs daily as needed (for shortness of breath). (Patient not taking: Reported on 05/13/2023)   folic acid (FOLVITE) 1 MG tablet Take 1 mg by mouth every morning.   furosemide  (LASIX ) 20 MG tablet Take 3 tablets (60 mg total) by mouth daily.   HYDROcodone -acetaminophen  (NORCO/VICODIN) 5-325 MG tablet Take 1 tablet by mouth every 4 (four) hours as needed.   hydrOXYzine  (ATARAX ) 50 MG tablet Take 50 mg by mouth 2 (two) times daily.   hyoscyamine  (LEVSIN  SL) 0.125 MG SL tablet Take 0.125 mg by mouth daily as needed for cramping.   insulin  aspart (NOVOLOG ) 100 UNIT/ML injection Inject 0-20 Units into the skin 3 (three) times daily with meals. This is a sliding scale in addition to scheduled Novolog  dose.  CBG < 70: Implement Hypoglycemia Standing Orders and refer to Hypoglycemia Standing Orders sidebar report CBG 70 - 120: 0 units CBG 121 - 150:  3 units CBG 151 - 200: 4 units CBG 201 - 250: 7 units CBG 251 - 300: 11 units CBG 301 - 350: 15 units CBG 351 - 400: 20 units   insulin  glargine-yfgn (SEMGLEE ) 100 UNIT/ML injection Inject 0.32 mLs (32 Units total) into the skin 2 (two) times daily.   lidocaine  (LIDODERM ) 5 % Place 1 patch onto the skin 2 (two) times daily. Remove & Discard patch within 12 hours or as directed by MD   loratadine  (CLARITIN ) 10 MG tablet Take 10 mg by mouth daily.   [Paused] losartan  (COZAAR ) 50 MG tablet Take 50 mg by mouth daily.   metFORMIN (GLUCOPHAGE) 500 MG tablet Take 500 mg by mouth 2  (two) times daily with a meal.   nystatin  (MYCOSTATIN /NYSTOP ) powder Apply 1 Application topically 2 (two) times daily as needed (Irritation).   omeprazole (PRILOSEC) 40 MG capsule Take 40 mg by mouth daily.   OZEMPIC , 0.25 OR 0.5 MG/DOSE, 2 MG/3ML SOPN Inject 0.5 mg into the skin once a week. Wednesday   phenytoin  (DILANTIN ) 100 MG ER capsule Take 400 mg by mouth daily.   polyethylene glycol powder (GLYCOLAX /MIRALAX ) 17 GM/SCOOP powder Take 1 Container by mouth once. Takes 1 scoop per day   prazosin  (MINIPRESS ) 1 MG capsule Take 1 mg by mouth in the morning. Take 1mg  (1 tab) in AM. Take 2mg  (2 tab) at bedtime (per patient statement).   rosuvastatin  (CRESTOR ) 40 MG tablet Take 1 tablet by mouth daily.   [Paused] spironolactone  (ALDACTONE ) 25 MG tablet Take 0.5 tablets (12.5 mg total) by mouth daily.   tiZANidine  (ZANAFLEX ) 4 MG capsule Take 4 mg by mouth 3 (three) times daily.   topiramate  (TOPAMAX ) 100 MG tablet Take 200 mg by mouth 2 (two) times daily.   topiramate  (TOPAMAX ) 200 MG tablet Take 200 mg by mouth 2 (two) times daily.   trazodone  (DESYREL ) 300 MG tablet Take 300 mg by mouth at bedtime as needed for sleep.   Vitamin D, Ergocalciferol, (DRISDOL) 1.25 MG (50000 UNIT) CAPS capsule Take 50,000 Units by mouth once a week.   warfarin (COUMADIN ) 3 MG tablet Take 1 tablet (3 mg total) by mouth daily at 4 PM.   warfarin (COUMADIN ) 5 MG tablet Take by mouth.   No current facility-administered medications for this visit. (Other)   REVIEW OF SYSTEMS:   ALLERGIES Allergies  Allergen Reactions   Latex Other (See Comments)    Welts - cant wear them, but can be touched by someone who wears them.   PAST MEDICAL HISTORY Past Medical History:  Diagnosis Date   Abdominal wall abscess 10/15/2020   Abdominal wall cellulitis 10/15/2020   Anxiety    Cellulitis 06/04/2021   Cellulitis of abdominal wall 02/14/2022   CHF (congestive heart failure) (HCC)    Diabetes mellitus     Hypercholesteremia    Hypertension    Obesity    Seizures (HCC)    Stroke [facility])    Past Surgical History:  Procedure Laterality Date   ABDOMINAL HYSTERECTOMY     ABDOMINAL SURGERY     CHOLECYSTECTOMY     JOINT REPLACEMENT     FAMILY HISTORY Family History  Adopted: Yes   SOCIAL HISTORY Social History   Tobacco Use   Smoking status: Never   Smokeless tobacco: Never  Vaping Use   Vaping status: Never Used  Substance Use Topics   Alcohol use: No   Drug use: No       OPHTHALMIC EXAM:  Not recorded  IMAGING AND PROCEDURES  Imaging and Procedures for 04/15/2024         ASSESSMENT/PLAN: No diagnosis found.   1-4. Diabetes mellitus, type 2 without retinopathy  - A1c: 7.4 on 08.20.24 - The incidence, risk factors for progression, natural history and treatment options for diabetic retinopathy were discussed with patient.   - The need for close monitoring of blood glucose, blood pressure, and serum lipids, avoiding cigarette or any type of tobacco, and the need for long term follow up was also discussed with patient. - f/u in 1 year, sooner prn  5,6. Hypertensive retinopathy OU - discussed importance of tight BP control - monitor  7. Age related macular degeneration, non-exudative, both eyes  - early stage with fine drusen OU  - The incidence, anatomy, and pathology of dry AMD, risk of progression, and the AREDS and AREDS 2 study including smoking risks discussed with patient.  - Recommend amsler grid monitoring  - f/u 4 months DFE, OCT  8. Pseudophakia OU  - s/p CE/IOL OU (Dr. Anselmo)  - IOL in good position, doing well  - monitor  Ophthalmic Meds Ordered this visit:  No orders of the defined types were placed in this encounter.    No follow-ups on file.  There are no Patient Instructions on file for this visit.  Explained the diagnoses, plan, and follow up with the patient and they expressed understanding.  Patient expressed understanding of the  importance of proper follow up care.   This document serves as a record of services personally performed by Redell JUDITHANN Hans, MD, PhD. It was created on their behalf by Wanda GEANNIE Keens, COT an ophthalmic technician. The creation of this record is the provider's dictation and/or activities during the visit.    Electronically signed by:  Wanda GEANNIE Keens, COT  04/01/24 7:29 AM     Redell JUDITHANN Hans, M.D., Ph.D. Diseases & Surgery of the Retina and Vitreous [facility]    Abbreviations: M myopia (nearsighted); A astigmatism; H hyperopia (farsighted); P presbyopia; Mrx spectacle prescription;  CTL contact lenses; OD right eye; OS left eye; OU both eyes  XT exotropia; ET esotropia; PEK punctate epithelial keratitis; PEE punctate epithelial erosions; DES dry eye syndrome; MGD meibomian gland dysfunction; ATs artificial tears; PFAT's preservative free artificial tears; NSC nuclear sclerotic cataract; PSC posterior subcapsular cataract; ERM epi-retinal membrane; PVD posterior vitreous detachment; RD retinal detachment; DM diabetes mellitus; DR diabetic retinopathy; NPDR non-proliferative diabetic retinopathy; PDR proliferative diabetic retinopathy; CSME clinically significant macular edema; DME diabetic macular edema; dbh dot blot hemorrhages; CWS cotton wool spot; POAG primary open angle glaucoma; C/D cup-to-disc ratio; HVF humphrey visual field; GVF goldmann visual field; OCT optical coherence tomography; IOP intraocular pressure; BRVO Branch retinal vein occlusion; CRVO central retinal vein occlusion; CRAO central retinal artery occlusion; BRAO branch retinal artery occlusion; RT retinal tear; SB scleral buckle; PPV pars plana vitrectomy; VH Vitreous hemorrhage; PRP panretinal laser photocoagulation; IVK intravitreal kenalog; VMT vitreomacular traction; MH Macular hole;  NVD neovascularization of the disc; NVE neovascularization elsewhere; AREDS age related eye disease study;  ARMD age related macular degeneration; POAG primary open angle glaucoma; EBMD epithelial/anterior basement membrane dystrophy; ACIOL anterior chamber intraocular lens; IOL intraocular lens; PCIOL posterior chamber intraocular lens; Phaco/IOL phacoemulsification with intraocular lens placement; PRK photorefractive keratectomy; LASIK laser assisted in situ keratomileusis; HTN hypertension; DM diabetes mellitus; COPD chronic obstructive pulmonary disease

## 2024-04-15 ENCOUNTER — Encounter (INDEPENDENT_AMBULATORY_CARE_PROVIDER_SITE_OTHER): Admitting: Ophthalmology

## 2024-04-15 DIAGNOSIS — H35033 Hypertensive retinopathy, bilateral: Secondary | ICD-10-CM

## 2024-04-15 DIAGNOSIS — Z7984 Long term (current) use of oral hypoglycemic drugs: Secondary | ICD-10-CM

## 2024-04-15 DIAGNOSIS — I10 Essential (primary) hypertension: Secondary | ICD-10-CM

## 2024-04-15 DIAGNOSIS — E119 Type 2 diabetes mellitus without complications: Secondary | ICD-10-CM

## 2024-04-15 DIAGNOSIS — H353131 Nonexudative age-related macular degeneration, bilateral, early dry stage: Secondary | ICD-10-CM

## 2024-04-15 DIAGNOSIS — Z961 Presence of intraocular lens: Secondary | ICD-10-CM

## 2024-04-15 DIAGNOSIS — Z794 Long term (current) use of insulin: Secondary | ICD-10-CM

## 2024-04-15 DIAGNOSIS — Z7985 Long-term (current) use of injectable non-insulin antidiabetic drugs: Secondary | ICD-10-CM

## 2024-06-05 ENCOUNTER — Ambulatory Visit: Attending: Cardiology | Admitting: Cardiology

## 2024-06-05 ENCOUNTER — Encounter: Payer: Self-pay | Admitting: Cardiology

## 2024-06-05 ENCOUNTER — Other Ambulatory Visit: Payer: Self-pay

## 2024-06-05 ENCOUNTER — Other Ambulatory Visit (HOSPITAL_COMMUNITY): Payer: Self-pay

## 2024-06-05 VITALS — BP 134/65 | HR 51 | Ht 63.0 in | Wt 302.0 lb

## 2024-06-05 DIAGNOSIS — I5033 Acute on chronic diastolic (congestive) heart failure: Secondary | ICD-10-CM | POA: Diagnosis not present

## 2024-06-05 DIAGNOSIS — I480 Paroxysmal atrial fibrillation: Secondary | ICD-10-CM

## 2024-06-05 DIAGNOSIS — I10 Essential (primary) hypertension: Secondary | ICD-10-CM

## 2024-06-05 DIAGNOSIS — Z7901 Long term (current) use of anticoagulants: Secondary | ICD-10-CM | POA: Diagnosis not present

## 2024-06-05 MED ORDER — WARFARIN SODIUM 1 MG PO TABS
ORAL_TABLET | ORAL | 0 refills | Status: AC
  Filled 2024-06-05: qty 48, 84d supply, fill #0

## 2024-06-05 MED ORDER — FUROSEMIDE 80 MG PO TABS
80.0000 mg | ORAL_TABLET | Freq: Every day | ORAL | 3 refills | Status: AC
  Filled 2024-06-05: qty 90, 90d supply, fill #0

## 2024-06-05 MED ORDER — WARFARIN SODIUM 5 MG PO TABS
ORAL_TABLET | ORAL | 3 refills | Status: AC
  Filled 2024-06-05: qty 90, 90d supply, fill #0

## 2024-06-05 NOTE — Patient Instructions (Addendum)
 Medication Instructions:  Continue Warfarin, unable to do eliquis    Change Lasix  to 80 mg daily   *If you need a refill on your cardiac medications before your next appointment, please call your pharmacy*  Lab Work IN 1 WEEK: BMP PROBNP  Pt/inr  If you have labs (blood work) drawn today and your tests are completely normal, you will receive your results only by: MyChart Message (if you have MyChart) OR A paper copy in the mail If you have any lab test that is abnormal or we need to change your treatment, we will call you to review the results.  Testing/Procedures: ECHOCARDIOGRAM  Your physician has requested that you have an echocardiogram. Echocardiography is a painless test that uses sound waves to create images of your heart. It provides your doctor with information about the size and shape of your heart and how well your hearts chambers and valves are working. This procedure takes approximately one hour. There are no restrictions for this procedure. Please do NOT wear cologne, perfume, aftershave, or lotions (deodorant is allowed). Please arrive 15 minutes prior to your appointment time.  Please note: We ask at that you not bring children with you during ultrasound (echo/ vascular) testing. Due to room size and safety concerns, children are not allowed in the ultrasound rooms during exams. Our front office staff cannot provide observation of children in our lobby area while testing is being conducted. An adult accompanying a patient to their appointment will only be allowed in the ultrasound room at the discretion of the ultrasound technician under special circumstances. We apologize for any inconvenience.  NEED FOLLOW UP APPOINTMENT WITH HEART FAILURE   Follow-Up: At [facility], you and your health needs are our priority.  As part of our continuing mission to provide you with exceptional heart care, our providers are all part of one team.  This team includes your  primary Cardiologist (physician) and Advanced Practice Providers or APPs (Physician Assistants and Nurse Practitioners) who all work together to provide you with the care you need, when you need it.  Your next appointment:   6 month(s)  Provider:   Newman JINNY Lawrence, MD    Other Instructions Heart Failure: Eating Plan Heart failure is a long-term condition where the heart can't pump enough blood through the body. When this happens, parts of the body don't get the blood and oxygen they need. Living with heart failure can be hard. But a healthy lifestyle and choosing the right foods may help to improve your symptoms. If you have heart failure, your eating plan will include limiting the amount of salt, also called sodium, and unhealthy fats you eat. What are tips for following this plan? Reading food labels Check food labels for the amount of sodium per serving. Choose foods that have less than 140 mg (milligrams) of sodium in each serving. Check food labels for the number of calories per serving. This is important if you need to limit your daily calorie intake to lose weight. Check food labels for the serving size. If you eat more than one serving, you'll be eating more sodium and calories than what's listed on the label. Look for foods with the words sodium-free, very low sodium, or low sodium on the package. Foods labeled as reduced sodium, lightly salted, or no salt added may still have more sodium than what's recommended for you. Cooking Avoid adding salt when cooking. Before using any salt substitutes, talk with your health care provider or an expert  in healthy eating called a dietitian. Season food with salt-free seasonings, spices, or herbs. Check the label of seasoning mixes to make sure they don't contain salt. Cook with heart-healthy oils, such as olive, canola, soybean, or sunflower oil. Do not fry foods. Cook foods using low-fat methods, like baking, boiling, grilling,  and broiling. Limit unhealthy fats when cooking. To do this: Remove the skin from poultry, such as chicken. Remove all the fat you can see on meats. Skim the fat off from stews, soups, and gravies before serving them. Meal planning  Limit your intake of: Processed, canned, or prepackaged foods. Foods that are high in trans fat, such as fried foods. Sweets, desserts, sugary drinks, and other foods with added sugar. Full-fat dairy products, such as whole milk. Eat a balanced diet. This may include: 4-5 servings of fruit each day and 4-5 servings of vegetables each day. At each meal, try to fill one-half of your plate with fruits and vegetables. Up to 6-8 servings of whole grains each day. Up to 2 servings of lean meat, poultry, or fish each day. One serving of meat is equal to 3 oz (85 g). This is about the same size as a deck of cards. 2 servings of low-fat dairy each day. Heart-healthy fats. Healthy fats called omega-3 fatty acids are a good choice. They're found in foods such as flaxseed and cold-water fish like sardines, salmon, and mackerel. Aim to eat 25-35 g (grams) of fiber a day. Foods that are high in fiber include apples, broccoli, carrots, beans, peas, and whole grains. Do not add salt or condiments that contain salt (such as soy sauce) to foods before eating. When eating at a restaurant, ask that your food be prepared with less salt or no salt, if possible. Try to eat 2 or more plant-based or meat-free meals each week. Cook more meals at home and eat less at restaurants, buffets, and fast food places. General information Do not eat more than 2,300 mg of sodium a day. The amount of sodium that's recommended for you may be lower, depending on your condition. Stay at a healthy weight as told. Ask your provider what a healthy weight is for you. Check your weight every day. Work with your provider and dietitian to make a plan that will help you lose weight or stay at your current  weight. Limit how much fluid you drink. Ask your provider or dietitian how much fluid you can have each day. Limit or avoid alcohol as told. Recommended foods Fruits All fresh, frozen, and canned fruits. Dried fruits, such as raisins, prunes, and cranberries. Vegetables All fresh vegetables. Vegetables that are frozen without sauce or added salt. Low-sodium or sodium-free canned vegetables. Grains Bread with less than 80 mg of sodium per slice. Whole-wheat pasta, quinoa, and brown rice. Oats and oatmeal. Barley. Millet. Grits and cream of wheat. Whole-grain and whole-wheat cold cereal. Meats and other protein foods Lean cuts of meat. Skinless chicken and turkey. Fish with high omega-3 fatty acids, such as salmon, sardines, and other cold-water fishes. Eggs. Dried beans, peas, and edamame. Unsalted nuts and nut butters. Dairy Low-fat or nonfat (skim) milk and dried milk. Rice milk, soy milk, and almond milk. Low-fat or nonfat yogurt. Small amounts of reduced-sodium block cheese. Low-sodium cottage cheese. Fats and oils Olive, canola, soybean, flaxseed, avocado, or sunflower oil. Sweets and desserts Applesauce. Granola bars. Sugar-free pudding and gelatin. Frozen fruit bars. Seasoning and other foods Fresh and dried herbs. Lemon or lime juice. Vinegar.  Low-sodium ketchup. Salt-free marinades, salad dressings, sauces, and seasonings. The items listed above may not be all the foods and drinks you can have. Talk with a dietitian to learn more. Foods to avoid Fruits Fruits that are dried with preservatives that contain sodium. Vegetables Canned vegetables. Frozen vegetables with sauce or seasonings. Creamed vegetables. French fries. Onion rings. Pickled vegetables and sauerkraut. Grains Bread with more than 80 mg of sodium per slice. Hot or cold cereal with more than 140 mg sodium per serving. Salted pretzels and crackers. Prepackaged breadcrumbs. Bagels, croissants, and biscuits. Meats and  other protein foods Ribs and chicken wings. Bacon, ham, pepperoni, bologna, salami, and packaged luncheon meats. Hot dogs, bratwurst, and sausage. Canned meat. Smoked meat and fish. Salted nuts and seeds. Dairy Whole milk, half-and-half, and cream. Buttermilk. Processed cheese, cheese spreads, and cheese curds. Regular cottage cheese. Feta cheese. Shredded cheese. String cheese. Fats and oils Butter, lard, shortening, ghee, and bacon fat. Canned and packaged gravies. Seasoning and other foods Onion salt, garlic salt, table salt, and sea salt. Marinades. Regular salad dressings. Relishes, pickles, and olives. Meat flavorings and tenderizers, and bouillon cubes. Horseradish, ketchup, and mustard. Worcestershire sauce. Teriyaki sauce, soy sauce (including reduced sodium). Hot sauce and Tabasco sauce. Steak sauce, fish sauce, oyster sauce, and cocktail sauce. Taco seasonings. Barbecue sauce. Tartar sauce. The items listed above may not be all the foods and drinks you should avoid. Talk with a dietitian to learn more. This information is not intended to replace advice given to you by your health care provider. Make sure you discuss any questions you have with your health care provider. Document Revised: 12/25/2022 Document Reviewed: 12/14/2022 Elsevier Patient Education  2024 [employer].

## 2024-06-05 NOTE — Progress Notes (Signed)
 " Cardiology Office Note:  .   Date:  06/05/2024  ID:  Donna Snow, DOB 01/27/1951, MRN [id number] PCP: [facility]  [facility] Providers Cardiologist:  Newman Lawrence, MD PCP: [facility], [facility]  Chief Complaint  Patient presents with   HFpEF     Donna Snow is a 74 y.o. female with hypertension, type 2 diabetes mellitus, paroxysmal A-fib, HFpEF, obesity, OSA/OHS, h/o stroke, seizures   Discussed the use of AI scribe software for clinical note transcription with the patient, who gave verbal consent to proceed.  History of Present Illness Donna Snow is a 74 year old female with atrial fibrillation, hypertension, diabetes, and high cholesterol who presents with fluid retention and medication management.  Patient was seen as hospital consult in 2023, and subsequently seen in heart failure clinic in 2024.  She has not establish care with general cardiology in the office before today.   She has atrial fibrillation previously treated with warfarin and now has intermittent irregular heartbeats. She has not seen a cardiologist since 2024. She reports shortness of breath and leg fluid retention. She takes spironolactone  12.5 mg once daily and furosemide  60 mg once daily but sometimes skips diuretics when going out because of frequent urination.  Her hypertension is treated with losartan , diabetes with metformin, and high cholesterol with rosuvastatin . She feels very tired and sleepy, which she attributes to her medications.  She has back pain that limits activity and sleeps in a lounge chair. She cannot lie flat without breathing difficulty, consistent with orthopnea.  Her diet includes fresh and canned foods and frequent garlic salt use, which may increase sodium intake. She uses a CPAP machine regularly for sleep apnea. A prior echocardiogram showed mild aortic valve calcification without severe valvular disease.      Vitals:   06/05/24 1323   BP: 134/65  Pulse: (!) 51  SpO2: 94%      Review of Systems  Cardiovascular:  Positive for dyspnea on exertion, leg swelling and orthopnea. Negative for chest pain, palpitations and syncope.        Studies Reviewed: SABRA    EKG 06/05/2024: Sinus bradycardia with marked sinus arrhythmia Left ventricular hypertrophy with repolarization abnormality ( R in aVL ) When compared with ECG of 13-May-2023 09:20,  rate is slower     Echocardiogram 05/2023: 1. Left ventricular ejection fraction, by estimation, is 55 to 60%. The  left ventricle has normal function. Left ventricular endocardial border  not optimally defined to evaluate regional wall motion. There is mild  concentric left ventricular  hypertrophy. Left ventricular diastolic parameters are consistent with  Grade I diastolic dysfunction (impaired relaxation).   2. Right ventricular systolic function was not well visualized. The right  ventricular size is normal. Tricuspid regurgitation signal is inadequate  for assessing PA pressure.   3. The mitral valve is grossly normal. Trivial mitral valve  regurgitation.   4. The aortic valve is tricuspid. There is mild calcification of the  aortic valve. Aortic valve regurgitation is trivial. Aortic valve  sclerosis is present, with no evidence of aortic valve stenosis. Aortic  valve mean gradient measures 7.0 mmHg.   5. The inferior vena cava is normal in size with <50% respiratory  variability, suggesting right atrial pressure of 8 mmHg.   Comparison(s): Prior images reviewed side by side. LVEF remains normal  range at 55-60%.   Labs 03/2024: Hb 12.6 Cr 1.0 INR 2.3  Risk Assessment/Calculations:    CHA2DS2-VASc Score = 7  This indicates a 11.2% annual risk of stroke. The patient's score is based upon: CHF History: 1 HTN History: 1 Diabetes History: 1 Stroke History: 2 Vascular Disease History: 0 Age Score: 1 Gender Score: 1     Physical Exam Vitals and nursing  note reviewed.  Constitutional:      General: She is not in acute distress.    Appearance: She is obese.  Neck:     Vascular: No JVD.  Cardiovascular:     Rate and Rhythm: Normal rate and regular rhythm.     Heart sounds: Normal heart sounds. No murmur heard. Pulmonary:     Effort: Pulmonary effort is normal.     Breath sounds: Normal breath sounds. No wheezing or rales.  Musculoskeletal:     Right lower leg: Edema (2+) present.     Left lower leg: Edema (2+) present.      VISIT DIAGNOSES:   ICD-10-CM   1. PAF (paroxysmal atrial fibrillation) (HCC)  I48.0 EKG 12-Lead    Basic metabolic panel with GFR    Pro b natriuretic peptide (BNP)    CBC    Protime-INR    CANCELED: Protime-INR    2. Acute on chronic heart failure with preserved ejection fraction (HFpEF) (HCC)  I50.33 EKG 12-Lead    ECHOCARDIOGRAM COMPLETE    Basic metabolic panel with GFR    Pro b natriuretic peptide (BNP)    AMB referral to [facility]    Basic metabolic panel with GFR    Pro b natriuretic peptide (BNP)    3. Essential hypertension  I10        Donna Snow is a 74 y.o. female with hypertension, type 2 diabetes mellitus, paroxysmal A-fib, HFpEF, obesity, OSA/OHS, h/o stroke, seizures  Assessment & Plan Acute on chronic HFpEF: Fluid retention in legs and orthopnea. Echocardiogram needed to assess changes, last in 05/2023 showed normal LVEF. Fluid retention may be due to heart failure with preserved ejection fraction, possibly exacerbated by longstanding hypertension, diabetes, obesity, and sleep apnea. Current diuretic regimen is insufficient. - Ordered echocardiogram to assess heart function and fluid status. - Increased Lasix  dose to 80 mg once daily, instead of total of 60 mg daily. -Check BMP and proBNP today, and repeat again ideally in 1 week.  However, patient is traveling to [place]  and will return in 2-3 weeks and check her repeat labs then along with echocardiogram. - Provided  educational material on heart failure and dietary sodium restrictions. - Referred to heart failure clinic for ongoing management. - Patient will need close follow-up and is at elevated risk of hospitalization due to acute on chronic HFpEF.  Paroxysmal atrial fibrillation: Increased stroke risk. Currently not in atrial fibrillation.  Given her ongoing use of Dilantin  for seizures, unfortunately we have to continue warfarin.  Patient has not had INR checked in 2 months, currently on 5 mg Monday to Friday, and 60 mg on Saturday and Sunday.  Recheck INR today and refill her warfarin which she is running out.  She will establish with our Coumadin  clinic.          Meds ordered this encounter  Medications   furosemide  (LASIX ) 80 MG tablet    Sig: Take 1 tablet (80 mg total) by mouth daily.    Dispense:  90 tablet    Refill:  3   warfarin (COUMADIN ) 1 MG tablet    Sig: Take one tablet on Saturday and Sunday or as directed.    Dispense:  48  tablet    Refill:  0   warfarin (COUMADIN ) 5 MG tablet    Sig: TAKE ONE TABLET BY MOUTH MONDAY THROUGH FRIDAY AND ON SATURDAY AND SUNDAY TAKE 6 MG DAILY    Dispense:  90 tablet    Refill:  3     F/u in 6 months She will reestablish and continue follow-up with heart failure clinic  Signed, Newman JINNY Lawrence, MD  "

## 2024-06-05 NOTE — Addendum Note (Signed)
 Addended by: MANDA BOTTCHER B on: 06/05/2024 05:56 PM   Modules accepted: Orders

## 2024-06-06 ENCOUNTER — Telehealth: Payer: Self-pay

## 2024-06-06 ENCOUNTER — Ambulatory Visit: Payer: Self-pay | Admitting: Cardiology

## 2024-06-06 LAB — CBC
Hematocrit: 40.8 % (ref 34.0–46.6)
Hemoglobin: 13.2 g/dL (ref 11.1–15.9)
MCH: 28.8 pg (ref 26.6–33.0)
MCHC: 32.4 g/dL (ref 31.5–35.7)
MCV: 89 fL (ref 79–97)
Platelets: 292 x10E3/uL (ref 150–450)
RBC: 4.59 x10E6/uL (ref 3.77–5.28)
RDW: 13.1 % (ref 11.7–15.4)
WBC: 8.6 x10E3/uL (ref 3.4–10.8)

## 2024-06-06 LAB — BASIC METABOLIC PANEL WITH GFR
BUN/Creatinine Ratio: 17 (ref 12–28)
BUN: 14 mg/dL (ref 8–27)
CO2: 24 mmol/L (ref 20–29)
Calcium: 9.3 mg/dL (ref 8.7–10.3)
Chloride: 102 mmol/L (ref 96–106)
Creatinine, Ser: 0.81 mg/dL (ref 0.57–1.00)
Glucose: 233 mg/dL — AB (ref 70–99)
Potassium: 4.5 mmol/L (ref 3.5–5.2)
Sodium: 139 mmol/L (ref 134–144)
eGFR: 77 mL/min/1.73

## 2024-06-06 LAB — PRO B NATRIURETIC PEPTIDE: NT-Pro BNP: 147 pg/mL (ref 0–301)

## 2024-06-06 NOTE — Telephone Encounter (Signed)
 Left patient's daughter message to have her mother call us  to schedule New Coumadin  visit next week.

## 2024-06-06 NOTE — Telephone Encounter (Signed)
-----   Message from Nurse Lyle RAMAN, RN sent at 06/05/2024  5:40 PM EST ----- Regarding: RE: labs  ----- Message ----- From: Manda Lyle NOVAK, RN Sent: 06/05/2024   5:40 PM EST To: Lyle NOVAK Manda, RN Subject: labs                                           Follow up on labs

## 2024-06-06 NOTE — Telephone Encounter (Signed)
 Delayed documentation: Contacted patient this morning because it did not look like PT/INR order was completed. Pt thought that the lab had been drawn. Contacted [facility] and confirmed that lab was not collected and can not be added on at this time. Pt contacted and advised to try to get the lab completed before she goes out of town for the next three weeks. Pt states that reports that she would try but does not have access to transportation at this time. Pt advised during call to monitor for bleeding/ bruising. Pt reports that if this occurs that she will go to the nearest hospital to be evaluated. Pt reports that she will maintain the Warfarin 5 mg Monday through Friday, along with maintaining Warfarin 6 mg on Saturday and Sunday until able to have lab checked. ER precautions reviewed with patient again and she agrees with plan of care. Pt will have PT/INR when make in town if unable to have lab done today.

## 2024-06-19 ENCOUNTER — Telehealth: Payer: Self-pay

## 2024-06-19 NOTE — Telephone Encounter (Signed)
 I spoke to patient to schedule New Coumadin  appt, but she is presently in [place]  for a few more weeks.  She will call us  once she returns and schedule INR appt.  I tentatively scheduled 2/27, along with Echo appt.

## 2024-06-19 NOTE — Telephone Encounter (Signed)
 Lpmtcb to schedule New Coumadin appt

## 2024-07-11 ENCOUNTER — Ambulatory Visit (HOSPITAL_COMMUNITY)

## 2024-07-11 ENCOUNTER — Ambulatory Visit
